# Patient Record
Sex: Female | Born: 1950 | Race: White | Hispanic: No | Marital: Married | Smoking: Never smoker
Health system: Southern US, Community
[De-identification: ages and names within clinical notes are randomized; demographics above are authoritative.]

## PROBLEM LIST (undated history)

## (undated) DIAGNOSIS — E039 Hypothyroidism, unspecified: Secondary | ICD-10-CM

## (undated) DIAGNOSIS — E119 Type 2 diabetes mellitus without complications: Secondary | ICD-10-CM

## (undated) DIAGNOSIS — K769 Liver disease, unspecified: Secondary | ICD-10-CM

## (undated) DIAGNOSIS — I499 Cardiac arrhythmia, unspecified: Secondary | ICD-10-CM

## (undated) DIAGNOSIS — I2510 Atherosclerotic heart disease of native coronary artery without angina pectoris: Secondary | ICD-10-CM

## (undated) DIAGNOSIS — D869 Sarcoidosis, unspecified: Secondary | ICD-10-CM

## (undated) DIAGNOSIS — D861 Sarcoidosis of lymph nodes: Secondary | ICD-10-CM

## (undated) DIAGNOSIS — J449 Chronic obstructive pulmonary disease, unspecified: Secondary | ICD-10-CM

## (undated) DIAGNOSIS — F32A Depression, unspecified: Secondary | ICD-10-CM

## (undated) DIAGNOSIS — I10 Essential (primary) hypertension: Secondary | ICD-10-CM

## (undated) DIAGNOSIS — I350 Nonrheumatic aortic (valve) stenosis: Secondary | ICD-10-CM

## (undated) DIAGNOSIS — D759 Disease of blood and blood-forming organs, unspecified: Secondary | ICD-10-CM

## (undated) DIAGNOSIS — J189 Pneumonia, unspecified organism: Secondary | ICD-10-CM

## (undated) DIAGNOSIS — M1990 Unspecified osteoarthritis, unspecified site: Secondary | ICD-10-CM

## (undated) DIAGNOSIS — J45909 Unspecified asthma, uncomplicated: Secondary | ICD-10-CM

## (undated) DIAGNOSIS — I509 Heart failure, unspecified: Secondary | ICD-10-CM

## (undated) DIAGNOSIS — T8859XA Other complications of anesthesia, initial encounter: Secondary | ICD-10-CM

## (undated) DIAGNOSIS — C801 Malignant (primary) neoplasm, unspecified: Secondary | ICD-10-CM

## (undated) DIAGNOSIS — D860 Sarcoidosis of lung: Secondary | ICD-10-CM

## (undated) DIAGNOSIS — T4145XA Adverse effect of unspecified anesthetic, initial encounter: Secondary | ICD-10-CM

## (undated) DIAGNOSIS — F419 Anxiety disorder, unspecified: Secondary | ICD-10-CM

## (undated) DIAGNOSIS — R011 Cardiac murmur, unspecified: Secondary | ICD-10-CM

## (undated) DIAGNOSIS — C229 Malignant neoplasm of liver, not specified as primary or secondary: Secondary | ICD-10-CM

## (undated) HISTORY — DX: Malignant neoplasm of liver, not specified as primary or secondary: C22.9

## (undated) HISTORY — PX: OTHER SURGICAL HISTORY: SHX169

## (undated) HISTORY — PX: HERNIA REPAIR: SHX51

## (undated) HISTORY — PX: ABDOMINAL SURGERY: SHX537

## (undated) HISTORY — PX: CHOLECYSTECTOMY: SHX55

## (undated) HISTORY — PX: ABDOMINAL HYSTERECTOMY: SHX81

---

## 1999-01-19 HISTORY — PX: LASIK: SHX215

## 2001-04-11 ENCOUNTER — Ambulatory Visit (HOSPITAL_COMMUNITY): Admission: RE | Admit: 2001-04-11 | Discharge: 2001-04-11 | Payer: Self-pay | Admitting: Gastroenterology

## 2001-04-13 ENCOUNTER — Ambulatory Visit (HOSPITAL_COMMUNITY): Admission: RE | Admit: 2001-04-13 | Discharge: 2001-04-13 | Payer: Self-pay | Admitting: Gastroenterology

## 2001-04-13 ENCOUNTER — Encounter: Payer: Self-pay | Admitting: Gastroenterology

## 2001-04-26 ENCOUNTER — Encounter (INDEPENDENT_AMBULATORY_CARE_PROVIDER_SITE_OTHER): Payer: Self-pay | Admitting: Specialist

## 2001-04-26 ENCOUNTER — Ambulatory Visit (HOSPITAL_COMMUNITY): Admission: RE | Admit: 2001-04-26 | Discharge: 2001-04-26 | Payer: Self-pay | Admitting: Gastroenterology

## 2001-05-01 ENCOUNTER — Encounter: Admission: RE | Admit: 2001-05-01 | Discharge: 2001-05-01 | Payer: Self-pay | Admitting: Gastroenterology

## 2001-05-01 ENCOUNTER — Encounter: Payer: Self-pay | Admitting: Gastroenterology

## 2001-07-24 ENCOUNTER — Encounter: Payer: Self-pay | Admitting: Surgery

## 2001-07-27 ENCOUNTER — Inpatient Hospital Stay (HOSPITAL_COMMUNITY): Admission: RE | Admit: 2001-07-27 | Discharge: 2001-07-28 | Payer: Self-pay | Admitting: Surgery

## 2001-10-12 ENCOUNTER — Ambulatory Visit (HOSPITAL_COMMUNITY): Admission: RE | Admit: 2001-10-12 | Discharge: 2001-10-12 | Payer: Self-pay | Admitting: Gastroenterology

## 2002-04-02 ENCOUNTER — Inpatient Hospital Stay (HOSPITAL_COMMUNITY): Admission: AD | Admit: 2002-04-02 | Discharge: 2002-04-02 | Payer: Self-pay | Admitting: Family Medicine

## 2002-07-01 ENCOUNTER — Emergency Department (HOSPITAL_COMMUNITY): Admission: EM | Admit: 2002-07-01 | Discharge: 2002-07-01 | Payer: Self-pay | Admitting: Emergency Medicine

## 2002-07-01 ENCOUNTER — Encounter: Payer: Self-pay | Admitting: Emergency Medicine

## 2002-07-11 ENCOUNTER — Encounter: Payer: Self-pay | Admitting: Gastroenterology

## 2002-07-11 ENCOUNTER — Encounter: Admission: RE | Admit: 2002-07-11 | Discharge: 2002-07-11 | Payer: Self-pay | Admitting: Gastroenterology

## 2002-07-26 ENCOUNTER — Encounter: Admission: RE | Admit: 2002-07-26 | Discharge: 2002-07-26 | Payer: Self-pay | Admitting: Gastroenterology

## 2002-07-26 ENCOUNTER — Encounter: Payer: Self-pay | Admitting: Gastroenterology

## 2002-09-17 ENCOUNTER — Encounter: Payer: Self-pay | Admitting: Emergency Medicine

## 2002-09-17 ENCOUNTER — Emergency Department (HOSPITAL_COMMUNITY): Admission: EM | Admit: 2002-09-17 | Discharge: 2002-09-17 | Payer: Self-pay | Admitting: Emergency Medicine

## 2002-09-28 ENCOUNTER — Encounter: Admission: RE | Admit: 2002-09-28 | Discharge: 2002-09-28 | Payer: Self-pay | Admitting: Surgery

## 2002-09-28 ENCOUNTER — Encounter: Payer: Self-pay | Admitting: Surgery

## 2003-10-01 ENCOUNTER — Encounter (INDEPENDENT_AMBULATORY_CARE_PROVIDER_SITE_OTHER): Payer: Self-pay | Admitting: Specialist

## 2003-10-01 ENCOUNTER — Observation Stay (HOSPITAL_COMMUNITY): Admission: RE | Admit: 2003-10-01 | Discharge: 2003-10-02 | Payer: Self-pay | Admitting: Surgery

## 2004-02-13 ENCOUNTER — Ambulatory Visit (HOSPITAL_COMMUNITY): Admission: RE | Admit: 2004-02-13 | Discharge: 2004-02-13 | Payer: Self-pay | Admitting: Gastroenterology

## 2004-02-13 ENCOUNTER — Encounter (INDEPENDENT_AMBULATORY_CARE_PROVIDER_SITE_OTHER): Payer: Self-pay | Admitting: Specialist

## 2004-08-10 ENCOUNTER — Emergency Department (HOSPITAL_COMMUNITY): Admission: EM | Admit: 2004-08-10 | Discharge: 2004-08-11 | Payer: Self-pay | Admitting: Emergency Medicine

## 2005-08-09 ENCOUNTER — Ambulatory Visit (HOSPITAL_COMMUNITY): Admission: RE | Admit: 2005-08-09 | Discharge: 2005-08-09 | Payer: Self-pay | Admitting: Internal Medicine

## 2005-08-18 ENCOUNTER — Encounter: Admission: RE | Admit: 2005-08-18 | Discharge: 2005-08-18 | Payer: Self-pay | Admitting: Internal Medicine

## 2006-04-13 ENCOUNTER — Encounter: Admission: RE | Admit: 2006-04-13 | Discharge: 2006-04-13 | Payer: Self-pay | Admitting: Internal Medicine

## 2006-10-24 ENCOUNTER — Ambulatory Visit (HOSPITAL_COMMUNITY): Admission: RE | Admit: 2006-10-24 | Discharge: 2006-10-24 | Payer: Self-pay | Admitting: Internal Medicine

## 2007-03-24 ENCOUNTER — Encounter (INDEPENDENT_AMBULATORY_CARE_PROVIDER_SITE_OTHER): Payer: Self-pay | Admitting: Gastroenterology

## 2007-03-24 ENCOUNTER — Ambulatory Visit (HOSPITAL_COMMUNITY): Admission: RE | Admit: 2007-03-24 | Discharge: 2007-03-24 | Payer: Self-pay | Admitting: Gastroenterology

## 2007-04-20 ENCOUNTER — Ambulatory Visit (HOSPITAL_COMMUNITY): Admission: RE | Admit: 2007-04-20 | Discharge: 2007-04-20 | Payer: Self-pay | Admitting: Gastroenterology

## 2007-07-10 ENCOUNTER — Ambulatory Visit: Payer: Self-pay | Admitting: Cardiology

## 2007-07-11 ENCOUNTER — Observation Stay (HOSPITAL_COMMUNITY): Admission: EM | Admit: 2007-07-11 | Discharge: 2007-07-12 | Payer: Self-pay | Admitting: Emergency Medicine

## 2007-07-18 ENCOUNTER — Ambulatory Visit: Payer: Self-pay | Admitting: Cardiology

## 2007-08-08 ENCOUNTER — Emergency Department (HOSPITAL_COMMUNITY): Admission: EM | Admit: 2007-08-08 | Discharge: 2007-08-08 | Payer: Self-pay | Admitting: Family Medicine

## 2007-08-23 ENCOUNTER — Ambulatory Visit: Payer: Self-pay | Admitting: Cardiology

## 2007-08-23 LAB — CONVERTED CEMR LAB: TSH: 0.58 microintl units/mL (ref 0.35–5.50)

## 2007-08-31 ENCOUNTER — Ambulatory Visit (HOSPITAL_COMMUNITY): Admission: RE | Admit: 2007-08-31 | Discharge: 2007-08-31 | Payer: Self-pay | Admitting: Neurosurgery

## 2007-10-26 ENCOUNTER — Ambulatory Visit (HOSPITAL_COMMUNITY): Admission: RE | Admit: 2007-10-26 | Discharge: 2007-10-26 | Payer: Self-pay | Admitting: Internal Medicine

## 2007-11-16 ENCOUNTER — Encounter: Admission: RE | Admit: 2007-11-16 | Discharge: 2007-11-16 | Payer: Self-pay | Admitting: Internal Medicine

## 2008-06-08 ENCOUNTER — Emergency Department (HOSPITAL_COMMUNITY): Admission: EM | Admit: 2008-06-08 | Discharge: 2008-06-08 | Payer: Self-pay | Admitting: Family Medicine

## 2008-10-18 ENCOUNTER — Ambulatory Visit (HOSPITAL_COMMUNITY): Admission: RE | Admit: 2008-10-18 | Discharge: 2008-10-18 | Payer: Self-pay | Admitting: Gastroenterology

## 2008-10-24 ENCOUNTER — Ambulatory Visit (HOSPITAL_COMMUNITY): Admission: RE | Admit: 2008-10-24 | Discharge: 2008-10-24 | Payer: Self-pay | Admitting: Gastroenterology

## 2008-11-22 ENCOUNTER — Emergency Department (HOSPITAL_COMMUNITY): Admission: EM | Admit: 2008-11-22 | Discharge: 2008-11-22 | Payer: Self-pay | Admitting: Emergency Medicine

## 2008-12-03 ENCOUNTER — Inpatient Hospital Stay (HOSPITAL_COMMUNITY): Admission: RE | Admit: 2008-12-03 | Discharge: 2008-12-06 | Payer: Self-pay | Admitting: Surgery

## 2008-12-03 ENCOUNTER — Encounter (INDEPENDENT_AMBULATORY_CARE_PROVIDER_SITE_OTHER): Payer: Self-pay | Admitting: Surgery

## 2009-08-27 ENCOUNTER — Emergency Department (HOSPITAL_COMMUNITY): Admission: EM | Admit: 2009-08-27 | Discharge: 2009-08-27 | Payer: Self-pay | Admitting: Family Medicine

## 2009-09-17 ENCOUNTER — Ambulatory Visit (HOSPITAL_COMMUNITY): Admission: RE | Admit: 2009-09-17 | Discharge: 2009-09-17 | Payer: Self-pay | Admitting: Internal Medicine

## 2009-10-31 ENCOUNTER — Observation Stay (HOSPITAL_COMMUNITY)
Admission: EM | Admit: 2009-10-31 | Discharge: 2009-11-02 | Payer: Self-pay | Source: Home / Self Care | Admitting: Emergency Medicine

## 2010-01-29 ENCOUNTER — Ambulatory Visit (HOSPITAL_COMMUNITY)
Admission: RE | Admit: 2010-01-29 | Discharge: 2010-01-29 | Payer: Self-pay | Source: Home / Self Care | Attending: Gastroenterology | Admitting: Gastroenterology

## 2010-02-19 ENCOUNTER — Encounter (HOSPITAL_COMMUNITY): Payer: 59 | Attending: Surgery

## 2010-02-19 DIAGNOSIS — K449 Diaphragmatic hernia without obstruction or gangrene: Secondary | ICD-10-CM | POA: Insufficient documentation

## 2010-02-19 DIAGNOSIS — K219 Gastro-esophageal reflux disease without esophagitis: Secondary | ICD-10-CM | POA: Insufficient documentation

## 2010-02-19 LAB — SURGICAL PCR SCREEN
MRSA, PCR: NEGATIVE
Staphylococcus aureus: NEGATIVE

## 2010-02-19 LAB — COMPREHENSIVE METABOLIC PANEL
Albumin: 3.8 g/dL (ref 3.5–5.2)
Alkaline Phosphatase: 110 U/L (ref 39–117)
BUN: 15 mg/dL (ref 6–23)
Calcium: 9.5 mg/dL (ref 8.4–10.5)
Creatinine, Ser: 0.77 mg/dL (ref 0.4–1.2)
Glucose, Bld: 112 mg/dL — ABNORMAL HIGH (ref 70–99)
Potassium: 3.9 mEq/L (ref 3.5–5.1)
Total Protein: 7.1 g/dL (ref 6.0–8.3)

## 2010-02-24 ENCOUNTER — Inpatient Hospital Stay (HOSPITAL_COMMUNITY)
Admission: RE | Admit: 2010-02-24 | Discharge: 2010-02-27 | DRG: 328 | Disposition: A | Discharge status: Home / Self Care | Payer: 59 | Attending: Surgery | Admitting: Surgery

## 2010-02-24 DIAGNOSIS — K449 Diaphragmatic hernia without obstruction or gangrene: Secondary | ICD-10-CM | POA: Diagnosis present

## 2010-02-24 DIAGNOSIS — Y838 Other surgical procedures as the cause of abnormal reaction of the patient, or of later complication, without mention of misadventure at the time of the procedure: Secondary | ICD-10-CM | POA: Diagnosis present

## 2010-02-24 DIAGNOSIS — K219 Gastro-esophageal reflux disease without esophagitis: Secondary | ICD-10-CM | POA: Diagnosis present

## 2010-02-24 DIAGNOSIS — K929 Disease of digestive system, unspecified: Principal | ICD-10-CM | POA: Diagnosis present

## 2010-02-24 DIAGNOSIS — I10 Essential (primary) hypertension: Secondary | ICD-10-CM | POA: Diagnosis present

## 2010-02-24 DIAGNOSIS — K660 Peritoneal adhesions (postprocedural) (postinfection): Secondary | ICD-10-CM | POA: Diagnosis present

## 2010-02-25 ENCOUNTER — Inpatient Hospital Stay (HOSPITAL_COMMUNITY): Payer: 59

## 2010-02-25 LAB — CBC
HCT: 36.2 % (ref 36.0–46.0)
Hemoglobin: 12.2 g/dL (ref 12.0–15.0)
MCHC: 33.7 g/dL (ref 30.0–36.0)
RBC: 4.14 MIL/uL (ref 3.87–5.11)
WBC: 12.4 10*3/uL — ABNORMAL HIGH (ref 4.0–10.5)

## 2010-02-25 MED ORDER — IOHEXOL 300 MG/ML  SOLN: 50.0000 mL | Freq: Once | INTRAMUSCULAR | Status: AC | PRN

## 2010-02-26 LAB — CBC
Hemoglobin: 12.1 g/dL (ref 12.0–15.0)
MCH: 29.8 pg (ref 26.0–34.0)
MCV: 88.4 fL (ref 78.0–100.0)
Platelets: 133 10*3/uL — ABNORMAL LOW (ref 150–400)
RBC: 4.06 MIL/uL (ref 3.87–5.11)
WBC: 7.8 10*3/uL (ref 4.0–10.5)

## 2010-03-01 NOTE — Op Note (Signed)
NAMECHASSITY, [name]                  ACCOUNT NO.:  [id number]  MEDICAL RECORD NO.:  [id number]           PATIENT TYPE:  I  LOCATION:  PADM                         FACILITY:  [facility]  PHYSICIAN:  [name]. [name] Deutscher, MD  DATE OF BIRTH:  24-Jul-1950  DATE OF PROCEDURE: DATE OF DISCHARGE:  02/19/2010                              OPERATIVE REPORT   PREOPERATIVE DIAGNOSIS:  [name] had a laparoscopic Nissen done in 2003 without pledgets and had recurrent reflux symptoms, operated on in 2010 after she had had a previous gallbladder attack with some nausea and vomiting.  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  Upper GI shows what appears to be her wrap herniated above the diaphragm.  PROCEDURE:  Laparoscopic foregut dissection with takedown of adhesions and delineation of the anatomy, laparoscopic posterior repair of the diaphragm with a pledgeted suture first and with a second suture with the #50 lighted bougie in place calibrating the hiatus.  This brought it snugly and the second suture actually incorporated a piece of the old porcine biograft on the right crura, upper endoscopy by Dr. Ezzard [name] to verify anatomy which showed a good wrap with the wrap and the Z-line well below the diaphragm, anterior gastric plication plicating the anterior portion of the stomach up to the wrap to keep the wrap in the abdominal cavity.  SURGEON:  [name]. [name], M.D.  ASSISTANT:  [name]. [name], M.D.  OPERATING TIME:  Three and a half hours.  DESCRIPTION OF PROCEDURE:  [name] is a 60 year old lady taken to room 1 on Tuesday, February 24, 2010, and given general anesthesia.  The abdomen was prepped with PCMX and draped sterilely.  The abdomen was entered through the left upper quadrant using 5-mm 0 degrees Opti-Vu and I went through and placed trocars through her previous  incisions using the 10, 11 to the right lower and then the others were 5s.  An angle 5 scope was used.  [name] retractor was placed up in the midline retracting the left lateral segment.  I began a fairly tedious dissection mainly using scissors to work my way up and get above the wrap.  The wrap would appear to be herniated above the diaphragm, and I think this was related to her vomiting and Norwalk virus.  I carefully took all that down and eventually taken down a lot of adhesions which were very dense along to the mesh which was a Cook porcine biograft.  Once reduced we had an intact wrap, which we validated endoscopically and it was well below the diaphragm.  We put a 50 bougie down then and then repaired the diaphragm posteriorly first with a pledgeted suture and then with a second suture with tie knots.  It seemed to snug it up nicely.  I then went down and took purchases of the more distal stomach and sutured up to the wrap to form an anterior plication of the stomach up to the wrap.  These were placed  with a free needle and tied extracorporeal with no tie knots being used for those.  A nice result appeared and it appeared that we had achieved reduction of the wrap below the diaphragm and securing of the closure of the posterior hiatus.  The patient seemed to tolerate this procedure well and was taken to the recovery room in satisfactory condition after wounds were injected with Marcaine and were closed with 4-0 Vicryl.     [name] [name] Deutscher, MD     MBM/MEDQ  D:  02/24/2010  T:  02/24/2010  Job:  [id number]  cc:   [name], M.D. Fax: [phone]  JXBJYN WGN FAOZ, MD, Luella.Lat Fax: [phone]  Electronically Signed by [name] Murphy MD on 02/27/2010 06:30:45 PM

## 2010-03-01 NOTE — Discharge Summary (Signed)
  NAMELANNA, [name]                  ACCOUNT NO.:  [id number]  MEDICAL RECORD NO.:  [id number]           PATIENT TYPE:  I  LOCATION:  1526                         FACILITY:  [facility]  PHYSICIAN:  Thornton Park. 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  PROCEDURE:  Laparoscopic foregut dissection, repair of hiatal hernia posteriorly with two sutures, one with pledgets, upper endoscopy, anterior gastric plication, plicating the wrap down onto the stomach.  SURGEON:  Thornton Park. Daphine Deutscher, M.D.  COURSE IN THE HOSPITAL:  This 60 year old lady underwent the above- mentioned operation.  Postoperatively she did well.  On postoperative day #1 she had a swallow, which looked good.  She was started on liquids, clear and advanced to full.  By postoperative day #3 she was ready for discharge.  CONDITION ON DISCHARGE:  Condition good.  DISCHARGE MEDICATIONS:  Pain medicines none other than Tylenol over-the- counter.  FOLLOWUP:  Return in 2 to 3 weeks.     Thornton Park Daphine Deutscher, MD     MBM/MEDQ  D:  02/27/2010  T:  02/27/2010  Job:  [id number]  Electronically Signed by Luretha Murphy MD on 02/27/2010 06:30:50 PM

## 2010-04-01 LAB — CSF CELL COUNT WITH DIFFERENTIAL: WBC, CSF: 7 /mm3 — ABNORMAL HIGH (ref 0–5)

## 2010-04-01 LAB — GRAM STAIN

## 2010-04-01 LAB — URINALYSIS, ROUTINE W REFLEX MICROSCOPIC
Bilirubin Urine: NEGATIVE
Nitrite: NEGATIVE
Protein, ur: 30 mg/dL — AB
Specific Gravity, Urine: 1.016 (ref 1.005–1.030)
Urobilinogen, UA: 0.2 mg/dL (ref 0.0–1.0)

## 2010-04-01 LAB — COMPREHENSIVE METABOLIC PANEL
ALT: 44 U/L — ABNORMAL HIGH (ref 0–35)
ALT: 47 U/L — ABNORMAL HIGH (ref 0–35)
AST: 33 U/L (ref 0–37)
AST: 35 U/L (ref 0–37)
AST: 42 U/L — ABNORMAL HIGH (ref 0–37)
Albumin: 3.8 g/dL (ref 3.5–5.2)
Albumin: 3.8 g/dL (ref 3.5–5.2)
Alkaline Phosphatase: 116 U/L (ref 39–117)
CO2: 27 mEq/L (ref 19–32)
CO2: 27 mEq/L (ref 19–32)
Calcium: 8.9 mg/dL (ref 8.4–10.5)
Chloride: 109 mEq/L (ref 96–112)
Chloride: 111 mEq/L (ref 96–112)
Creatinine, Ser: 0.86 mg/dL (ref 0.4–1.2)
GFR calc Af Amer: >60 mL/min (ref >60)
GFR calc Af Amer: >60 mL/min (ref >60)
GFR calc non Af Amer: >60 mL/min (ref >60)
GFR calc non Af Amer: >60 mL/min (ref >60)
Glucose, Bld: 118 mg/dL — ABNORMAL HIGH (ref 70–99)
Potassium: 3.9 mEq/L (ref 3.5–5.1)
Potassium: 4.1 mEq/L (ref 3.5–5.1)
Sodium: 138 mEq/L (ref 135–145)
Sodium: 140 mEq/L (ref 135–145)
Sodium: 142 mEq/L (ref 135–145)
Total Bilirubin: 0.7 mg/dL (ref 0.3–1.2)
Total Protein: 6.2 g/dL (ref 6.0–8.3)
Total Protein: 6.9 g/dL (ref 6.0–8.3)

## 2010-04-01 LAB — AMMONIA: Ammonia: 30 umol/L (ref 11–35)

## 2010-04-01 LAB — CBC
HCT: 41.8 % (ref 36.0–46.0)
HCT: 42.4 % (ref 36.0–46.0)
Hemoglobin: 14.3 g/dL (ref 12.0–15.0)
Hemoglobin: 14.4 g/dL (ref 12.0–15.0)
MCH: 29.5 pg (ref 26.0–34.0)
MCV: 86.4 fL (ref 78.0–100.0)
MCV: 86.7 fL (ref 78.0–100.0)
RBC: 4.84 MIL/uL (ref 3.87–5.11)
WBC: 4.9 10*3/uL (ref 4.0–10.5)

## 2010-04-01 LAB — DIFFERENTIAL
Basophils Absolute: 0.1 10*3/uL (ref 0.0–0.1)
Basophils Relative: 2 % — ABNORMAL HIGH (ref 0–1)
Eosinophils Absolute: 0 10*3/uL (ref 0.0–0.7)
Eosinophils Absolute: 0.2 10*3/uL (ref 0.0–0.7)
Eosinophils Relative: 0 % (ref 0–5)
Eosinophils Relative: 4 % (ref 0–5)
Monocytes Absolute: 0.3 10*3/uL (ref 0.1–1.0)
Monocytes Absolute: 0.4 10*3/uL (ref 0.1–1.0)
Monocytes Relative: 8 % (ref 3–12)

## 2010-04-01 LAB — CK TOTAL AND CKMB (NOT AT ARMC)
CK, MB: 2.7 ng/mL (ref 0.3–4.0)
Total CK: 65 U/L (ref 7–177)

## 2010-04-01 LAB — APTT: aPTT: 26 seconds (ref 24–37)

## 2010-04-01 LAB — CSF CULTURE W GRAM STAIN: Culture: NO GROWTH

## 2010-04-01 LAB — TSH: TSH: 1.859 u[IU]/mL (ref 0.350–4.500)

## 2010-04-01 LAB — URINE MICROSCOPIC-ADD ON

## 2010-04-01 LAB — GLUCOSE, CSF: Glucose, CSF: 61 mg/dL (ref 43–76)

## 2010-04-01 LAB — HEMOGLOBIN A1C: Hgb A1c MFr Bld: 6.2 % — ABNORMAL HIGH (ref <5.7)

## 2010-04-01 LAB — METHYLMALONIC ACID, SERUM: Methylmalonic Acid, Quantitative: 204 nmol/L (ref 87–318)

## 2010-04-01 LAB — GLUCOSE, CAPILLARY: Glucose-Capillary: 128 mg/dL — ABNORMAL HIGH (ref 70–99)

## 2010-04-01 LAB — TROPONIN I: Troponin I: 0.38 ng/mL — ABNORMAL HIGH (ref 0.00–0.06)

## 2010-04-22 LAB — CBC
MCHC: 33.6 g/dL (ref 30.0–36.0)
Platelets: 134 10*3/uL — ABNORMAL LOW (ref 150–400)
RDW: 12.1 % (ref 11.5–15.5)

## 2010-04-22 LAB — COMPREHENSIVE METABOLIC PANEL
ALT: 89 U/L — ABNORMAL HIGH (ref 0–35)
AST: 65 U/L — ABNORMAL HIGH (ref 0–37)
Albumin: 3.8 g/dL (ref 3.5–5.2)
Alkaline Phosphatase: 92 U/L (ref 39–117)
CO2: 28 mEq/L (ref 19–32)
Chloride: 107 mEq/L (ref 96–112)
Creatinine, Ser: 0.76 mg/dL (ref 0.4–1.2)
GFR calc Af Amer: >60 mL/min (ref >60)
GFR calc non Af Amer: >60 mL/min (ref >60)
Potassium: 4.7 mEq/L (ref 3.5–5.1)
Sodium: 140 mEq/L (ref 135–145)
Total Bilirubin: 0.6 mg/dL (ref 0.3–1.2)

## 2010-04-22 LAB — DIFFERENTIAL
Basophils Absolute: 0 10*3/uL (ref 0.0–0.1)
Basophils Relative: 0 % (ref 0–1)
Lymphocytes Relative: 11 % — ABNORMAL LOW (ref 12–46)
Monocytes Absolute: 0.5 10*3/uL (ref 0.1–1.0)
Neutro Abs: 8.1 10*3/uL — ABNORMAL HIGH (ref 1.7–7.7)
Neutrophils Relative %: 83 % — ABNORMAL HIGH (ref 43–77)

## 2010-06-02 NOTE — Op Note (Signed)
NAMEMAYBEL, DAMBROSIO                  ACCOUNT NO.:  [id number]   MEDICAL RECORD NO.:  [id number]          PATIENT TYPE:  AMB   LOCATION:  ENDO                         FACILITY:  [facility]   PHYSICIAN:  Anselmo Rod, M.D.  DATE OF BIRTH:  1950/11/15   DATE OF PROCEDURE:  03/24/2007  DATE OF DISCHARGE:  03/24/2007                               OPERATIVE REPORT   PROCEDURE:  Colonoscopy with snare polypectomy x 1.   ENDOSCOPIST:  Anselmo Rod, M.D.   INSTRUMENT USED:  Pentax video colonoscope.   INDICATIONS FOR PROCEDURE:  A 60 year old white female with a history of  abdominal pain underwent CRC screening to rule out colonic polyps,  masses, etc.   PREPROCEDURE PREPARATION:  Informed consent was procured from the  patient. The patient was fasted for four hours prior to the procedure  after being prepped with 20 OsmoPrep pills the night of and 12 OsmoPrep  pills the morning of the procedure.  The risks and benefits of the  procedure including a 10% miss rate of cancer and polyp were discussed  with the patient as well.   PREPROCEDURE PHYSICAL EXAMINATION:  VITAL SIGNS:  Stable.  NECK:  Supple.  CHEST:  Clear to auscultation.  S1 and S2 regular.  ABDOMEN:  Soft and normal bowel sounds.   DESCRIPTION OF PROCEDURE:  The patient was placed in the left lateral  decubitus position and sedated with 150 mcg of Fentanyl and 8 mg of  Versed given intravenously in slow incremental doses. Once the patient  was adequately sedated and maintained on low flow oxygen and continuous  cardiac monitoring, the Pentax video colonoscope was advanced from the  rectum to the cecum.  A small sessile polyp was removed by a hard snare  from 20 cm and the patient has somewhat tortuous redundant colon.  Therefore the adult scope was withdrawn from hepatic flexure and the  pediatric scope used instead.  The appendiceal orifice and ileocecal  valve were visualized and photographed.  No other polyps, masses,  erosions, ulcerations, or diverticula were noted.  Retroflexion in the  rectum revealed no abnormality.  The patient tolerated the procedure  well without any immediate complications.   IMPRESSION:  1. Small sessile polyp removed by hard snare from 20 cm. Otherwise      normal examination up to the cecum.  No other masses or polyps      seen.  2. No evidence of diverticulosis.  3. Adult scope changed to pediatric scope because the patient has a      redundant tortuous colon possibly from the multiple surgeries that      she has had in the past.   RECOMMENDATIONS:  1. Await pathology results.  2. Avoid all nonsteroidals including aspirin for the next two weeks.  3. Outpatient follow-up in the next two weeks for further      recommendations.  4. Repeat colonoscopy depending on the pathology results.      Anselmo Rod, M.D.  Electronically Signed     JNM/MEDQ  D:  03/27/2007  T:  03/28/2007  Job:  [id number]   cc:   Carollee Massed

## 2010-06-02 NOTE — Assessment & Plan Note (Signed)
[facility]                            CARDIOLOGY OFFICE NOTE   NAME:[name]                         MRN:          [id number]  DATE:07/18/2007                            DOB:          May 13, 1950    [name] is in for followup.  She was recently admitted to the hospital with  an episode of chest pain.  She has not been seen previously.  She has no  primary care doctor.  She has seen Dr. Loreta Ave because of elevated liver  functions.  While she was in the hospital, she had a normal chest x-ray  and a normal D-dimer.  She underwent exercise Myoview imaging.  The  images revealed a normal stress study with no ischemia or infarction,  hypertensive response to exercise, and ejection fraction of 75%.  She  did get short of breath with only about 4 minutes of exercise and had  very minimal discomfort.   Since discharge from the hospital, she has been to [place] and  done other things without any recurrent symptoms.   The patient has hypothyroidism and is on thyroid replacement.  She has  hyperlipidemia.  The exact source of her symptoms was unclear.   Currently, the blood pressure is 158/90, the pulse is 84, the lung  fields are clear, and the cardiac rhythm is regular.   Hospital labs included negative cardiac enzymes.  Renal function studies  were unremarkable.  Her TSH was 0.355.  Her LDL was 122 with an HDL of  31.   Overall, the patient is stable.  I have encouraged her to try to lose  weight.  She is going to get a primary care doctor and she is going to  record her blood pressures on a regular basis.  I think she is stable to  return to work at this point in time.  She will need to have her TSH  rechecked and I will see her back in 4 weeks, at which time we will  reassess this.     Arturo Morton. Riley Kill, MD, [facility]  Electronically Signed    TDS/MedQ  DD: 07/18/2007  DT: 07/19/2007  Job #: [id number]

## 2010-06-02 NOTE — Discharge Summary (Signed)
[name], [name]                  ACCOUNT NO.:  [id number]   MEDICAL RECORD NO.:  [id number]          PATIENT TYPE:  OBV   LOCATION:  6531                         FACILITY:  [facility]   PHYSICIAN:  [name]   DATE OF ADMISSION:  07/10/2007  DATE OF DISCHARGE:  07/12/2007                               DISCHARGE SUMMARY   PROCEDURES PERFORMED DURING HOSPITALIZATION:  Stress Myoview revealed  normal per Dr. Charlton [name].   FINAL DISCHARGE DIAGNOSES:  1. Newly diagnosed hypertension.  2. Atypical chest pain.  A:  Negative cardiac enzymes and EKG.  1. History of hyperlipidemia.  2. History of hypothyroidism.  3. Recent influenza exposure currently on Tamiflu.   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  The patient had no exacerbating or relieving  factors.  She was transferred to [facility]  secondary to the symptoms.  Morphine did relieve her chest discomfort,  but unfortunately she developed significant nausea and vomiting.  The  patient was admitted to rule out myocardial infarction and followed by  Dr. Bonnee [name] and Dr. Rollene Rotunda during her admission.  The  patient's cardiac enzymes were cycled and found to be negative x3 at  0.05, 0.05 0.05, and 0.02 respectively.  Cholesterol studies were also  completed and she had mild hyperlipidemia with LDL of 122.  She had EKG  revealing normal sinus rhythm with a right bundle branch block and no  acute abnormalities.  The patient subsequently was scheduled for a  stress nuclear study at the request of Dr. Riley [name] secondary to the  patient's family history of CAD.  She did have a family history of  premature coronary artery  disease with her father in his 36s.  The  patient did undergo a stress nuclear study on July 12, 2007, she did  have a hypertensive response to exercise with some mild chest pressure  and maximum were close with a blood pressure of 230/69 and heart rate of  136 which subsequently went away with completion of the test and blood  pressure and heart rate returned to normal without difficulty.  The  patient prior to the stress test blood pressure was 164/69 at rest.  After discussion with Dr. Riley [name], he felt that the patient would  benefit from Norvasc and she was placed on Norvasc 5 mg 1 p.o. q. day  and a prescription was provided for her as an R.N. I have asked her to  please take her blood pressure in the morning and in the evening and  record.  She is not to go back to work until followed by Dr. Riley [name].  I  have also advised her that if her blood pressure remains low that she is  to call our office and we may need to stop or adjust the dose of  the  Norvasc.   DISCHARGE LABS:  Troponins as above.  Total cholesterol 182,  triglycerides 126, HDL 31, LDL 122, lipase 26 amylase 129, BNP less than  30, PT 12.9, INR 1.0.  Sodium 140, potassium 4.1, chloride 109, BUN 17,  creatinine 0.8, glucose 107, hemoglobin 14.0, hematocrit 41, white blood  cells 5.8, platelets 160.   DISCHARGE VITAL SIGNS:  Blood pressure 126/45, heart rate 68,  respirations 18, temperature 98.6   DISCHARGE MEDICATIONS:  1. Synthroid 150 mcg daily.  2. Prilosec over-the-counter daily.  3. Norvasc 5 mg daily (She is to take blood pressure twice a day and      record and call for any evidence of hypotension with a systolic      blood pressure less than 100).   ALLERGIES:  No known drug allergies.   FOLLOWUP PLANS AND APPOINTMENT:  1. The patient will be seen by Dr. Riley [name] on July 18, 2007, at 2:00      p.m.  2. She is not to return to work until after July 18, 2007, appointment      and released by Dr. Riley [name]   3. The patient has been advised on new prescription for Norvasc and to      take her blood pressure in the a.m. and in the p.m. and record and      she is to not take the Norvasc for a systolic blood pressure less      than 100 and to call our office for this results.   Time spent with the patient 30 minutes.      [name] Mare. Lyman Bishop, NP      [name] Rotunda, [name], [facility]  Electronically Signed    KML/MEDQ  D:  07/12/2007  T:  07/13/2007  Job:  [id number]

## 2010-06-02 NOTE — H&P (Signed)
[name], [name] NO.:  [id number]   MEDICAL RECORD NO.:  [id number]          PATIENT TYPE:  OBV   LOCATION:  6524                         FACILITY:  [facility]   PHYSICIAN:  Lowella Bandy, MD      DATE OF BIRTH:  17-Jun-1950   DATE OF ADMISSION:  07/10/2007  DATE OF DISCHARGE:                              HISTORY & PHYSICAL   PRIMARY CARE PHYSICIAN:  The patient's primary care doctor; [facility].   PRIMARY CARDIOLOGIST:  None.   CHIEF COMPLAINT:  Chest pain.   HISTORY OF THE PRESENT ILLNESS:  The patient is a very pleasant 60-year-  old white female with cardiac risk factor of a strong family history of  premature coronary artery disease who started having chest pain while  working this evening at approximately 9:00 P.M.  She was working in her  usual shift as a Engineer, civil (consulting) at [employer]  and reports that she  developed left sharp left-sided chest pain.  She reports that it was  initially up to 8/10  in severity and was nearly constant.  She cannot  identify any exacerbating or alleviating factors.  After the pain  persisted for over several minutes she was transferred to the [facility]  emergency department.  Morphine has helped relieve her chest pain, but  unfortunately she has subsequently developed significant nausea and  vomiting.  She denies any antecedent fever, cough or swelling in her  legs.  She reports that she was feeling well and in her baseline state  of health until this event occurred.  She denies any unusual stressful  events while at work this evening.   PAST MEDICAL AND SURGICAL HISTORY:  1. Hypothyroidism.  2. Fatty liver disease.  3. Questionable borderline hyperlipidemia.  4. Gastroesophageal reflux disease status post Nissen fundoplication.  5. Status post hysterectomy.  6. Status post cholecystectomy.   MEDICATIONS:  1. Levo-thyroxine 150 mcg daily.  2. Prilosec 20 mg daily.  3. Tamiflu since Friday for empiric prophylaxis after  the patient      reportedly had influenza exposure at [facility].   ALLERGIES:  No known drug allergies.   SOCIAL HISTORY:  The patient lives with her husband.  She works as a  Engineer, civil (consulting) at [employer].  She is nonsmoker and does not drink alcohol.   FAMILY HISTORY:  The family history is positive for her father having  his first MI in his 4s and had bypass surgery in his 57s; however, he  eventually died of cardiac complications in his 54s.  Her birth mother,  whom she did not know very well she believes she had bypass surgery as  well.  One brother had an MI and CABG at age 85.   REVIEW OF SYSTEMS:  Ten systems are reviewed and are negative other than  as noted above in the HPI.   PHYSICAL EXAMINATION:  VITAL SIGNS:  On physical exam temperature is  98.3, blood pressure is 111/86, pulse is 79 and oxygen saturation is 98%  on 2 liters nasal cannula.  GENERAL APPEARANCE:  In  general the patient is breathing comfortably and  is in no apparent distress.  HEENT:  Normocephalic and atraumatic.  Extraocular movements are intact.  Sclerae anicteric.  Pupils react to light.  Oropharynx clear.  NECK:  The  neck is supple.  No masses appreciated.  No carotid bruits  or JVD.  HEART:  Cardiovascular exam reveals a regular rate and rhythm.  Normal  S1 and S2.  No murmurs, rubs or gallops.  CHEST:  The chest clear to auscultation bilaterally.  No crackles,  wheezes or rhonchi.  ABDOMEN:  The abdomen is soft, nontender and nondistended.  Normal bowel  sounds.  Cervix warm, no edema.  SKIN:  The skin reveals no rashes or eruptions.  MUSCULOSKELETAL:  The musculoskeletal exam showed no joint effusions or  erythema.  NEUROLOGIC EXAMINATION:  Neurologically the patient is alert and  oriented x3.  Cranial nerves are grossly intact and she is  moving all  extremities well.   LABORATORY DATA:  Chest x-ray; the chest x-ray reviewed is normal.  EKG  reviewed shows sinus rhythm at 90  beats/minute with RSR prime pattern,  otherwise normal EKG.  Laboratory values are reviewed.  The white blood  cell count is 5.8, hemoglobin is 14.0, hematocrit 41 is and  platelets  are 160,000.  Sodium 140, potassium 4.1, chloride 109, BUN 17,  creatinine 0.8, and glucose 107.  CK/MB 3.1.  Troponin I less than 0.05.   ASSESSMENT:  This is a 60 year old female with a strong family history  of premature coronary artery disease who presents with a chest pain  syndrome that is largely atypical for myocardial ischemia.  She does not  have any acute ST changes on her EKG and her initial troponin is  negative.  However, given her strong family history and concerning  symptoms, she will be admitted to rule out myocardial infarction.   PLAN:  1. We will admit her to Dr. Jenene Slicker service and rule out MI with      serial cardiac enzymes and EKGs.  2. Aspirin and therapeutic Lovenox have been administered in the      emergency department.  3. We will also check a D-dimer level, although pulmonary embolism      seems highly unlikely.  4. If she rules out for myocardial infarction, she might be able to be      discharged with an outpatient stress evaluation.      Lowella Bandy, MD  Electronically Signed     JJC/MEDQ  D:  07/11/2007  T:  07/11/2007  Job:  [id number]

## 2010-06-02 NOTE — Assessment & Plan Note (Signed)
[facility]                            CARDIOLOGY OFFICE NOTE   NAME:[name], [name] WASSENAAR                         MRN:          [id number]  DATE:08/23/2007                            DOB:          May 22, 1950    [name] is in for followup.  She has been seeing Dr. Ludwig Clarks and her  blood pressure has really been improved on Azor.  She has not had any  recurrent significant problems.  Fortunately, she does not smoke.   Her current medications include,  1. Synthroid 150 mcg daily.  2. Prilosec 20 mg daily.  3. Fish oil daily.  4. Aspirin 325 mg daily.  5. Azor 1/2 tablet daily.   The weight is 173 pounds, the blood pressure is 133/73, and the pulse is  63.   The patient did undergo a stress nuclear imaging in June of this year.  She had a hypertensive response to exercise with some mild blood  pressure elevation and some chest discomfort, but perfusion scan was  unremarkable.  Ejection fraction was 75%.   We will go ahead and get a TSH level to make sure that is stable.  I  will see her back in followup probably as needed, and she can continue  to follow up with Dr. Ludwig Clarks at the present time.  Should she have any  further problems, she is to contact us promptly.     Arturo Morton. Riley Kill, MD, [facility]  Electronically Signed    TDS/MedQ  DD: 09/02/2007  DT: 09/03/2007  Job #: [id number]

## 2010-06-05 NOTE — Op Note (Signed)
NAMEKALANA, YUST                            ACCOUNT NO.:  [id number]   MEDICAL RECORD NO.:  [id number]                   PATIENT TYPE:  AMB   LOCATION:  DAY                                  FACILITY:  [facility]   PHYSICIAN:  Abigail Miyamoto, M.D.              DATE OF BIRTH:  1951-01-14   DATE OF PROCEDURE:  10/01/2003  DATE OF DISCHARGE:                                 OPERATIVE REPORT   PREOPERATIVE DIAGNOSIS:  Biliary dyskinesia.   POSTOPERATIVE DIAGNOSIS:  Biliary dyskinesia.   PROCEDURE:  Laparoscopic cholecystectomy with intraoperative cholangiogram.   SURGEON:  Abigail Miyamoto, M.D.   ASSISTANT:  Sheppard Plumber. Earlene Plater, M.D.   ANESTHESIA:  General endotracheal anesthesia.   ESTIMATED BLOOD LOSS:  Minimal.   FINDINGS:  The patient was found to have a large, fatty-appearing liver.  There were no masses visualized and no evidence of any type of cirrhosis.  The gallbladder was chronically scarred in appearance with multiple  adhesions.  The rest of the peritoneal cavity appeared normal.  Cholangiogram was normal as well.   PROCEDURE IN DETAIL:  The patient was brought to the operating room and  identified as Flossie Dibble.  She was placed supine on the operating table and  general anesthesia was induced.  Her abdomen was then prepped and draped in  the usual sterile fashion.  Using a #15 blade, a small vertical incision was  made below the umbilicus.  The incision was carried down to the fascia,  which was opened with a scalpel.  A hemostat was then used to pass through  the peritoneal cavity.  Next a 0 Vicryl pursestring suture was passed around  the fascial opening.  The Hasson port was placed through the opening and  insufflation of the abdomen was begun.  A 12 mm port was then placed in the  patient's epigastrium and two 5 mm ports were placed in the patient's right  flank under direct vision.  The gallbladder was then identified.  There was  a single adhesion of omentum to  the abdominal wall, which was taken down  bluntly.  The gallbladder itself had multiple adhesions of omentum to it as  well, and these were taken down bluntly and with the electrocautery.  After  this was done, the gallbladder could be fully elevated.  The cystic duct was  easily dissected out with the cystic artery and a critical window was  achieved around both.  The artery was clipped twice proximally and once  distally and transected.  The duct was then clipped once distally and partly  opened with the laparoscopic scissors.  An angiocatheter was then inserted  in the right upper quadrant under direct vision.  The cholangiocatheter was  passed through this and placed into the opening in the cystic duct.  A  cholangiogram was then performed with contrast.  Contrast was seen to flow  into the entire  biliary system and duodenum without evidence of abnormality  or obstruction.  The cholangiocatheter was then completely removed.  The  cystic duct was then clipped three times proximally and completely  transected.  The gallbladder was then easily dissected free from the liver  bed with the electrocautery.  Once it was freed from the liver bed, the  liver bed was examined and hemostasis was again achieved with the cautery.  The gallbladder was then removed through the incision at the umbilicus.  The  0 Vicryl at the umbilicus was tied in place, closing the fascial defect.  The abdomen was then irrigated with normal saline.  Again hemostasis  appeared to be achieved.  All ports were then removed under direct vision,  and the abdomen was deflated.  All incisions were then anesthetized with  0.25% Marcaine and  closed with 4-0 Monocryl subcuticular sutures.  Steri-Strips, gauze, and  tape were then applied.  The patient tolerated the procedure well.  All  sponge, needle, and instrument counts were correct at the end of the  procedure.  The patient was then extubated in the operating room and  taken  in stable condition to the recovery room.                                               Abigail Miyamoto, M.D.    DB/MEDQ  D:  10/01/2003  T:  10/01/2003  Job:  [id number]   cc:   Anselmo Rod, M.D.  [address]  [address]  [place]  [place] [address]  Fax: [phone]

## 2010-06-05 NOTE — Op Note (Signed)
[name], [name]                            ACCOUNT NO.:  [id number]   MEDICAL RECORD NO.:  [id number]                   PATIENT TYPE:  AMB   LOCATION:  ENDO                                 FACILITY:  [facility]   PHYSICIAN:  Charna Elizabeth, M.D.                   DATE OF BIRTH:  [birth year]   DATE OF PROCEDURE:  10/12/2001  DATE OF DISCHARGE:                                 OPERATIVE REPORT   Patient AKA [name].   PROCEDURE PERFORMED:  Colonoscopy.   ENDOSCOPIST:  Charna Elizabeth, M.D.   INSTRUMENT USED:  Pediatric adjustable Olympus video colonoscope.   INDICATIONS FOR PROCEDURE:  The patient is a 60 year old white female with  history of rectal bleeding.  Rule out colonic polyps, masses, hemorrhoids,  etc.   PREPROCEDURE PREPARATION:  Informed consent was procured from the patient.  The patient was fasted for eight hours prior to the procedure and prepped  with a bottle of magnesium citrate and a gallon of NuLytely the night prior  to the procedure.   PREPROCEDURE PHYSICAL:  The patient had stable vital signs.  Neck supple.  Chest clear to auscultation.  S1 and S2 regular.  Abdomen soft with normal  bowel  sounds.   DESCRIPTION OF PROCEDURE:  The patient was placed in left lateral decubitus  position and sedated with 50 mg of Demerol and 5 mg of Versed intravenously.  Once the patient was adequately sedated and maintained on low flow oxygen  and continuous cardiac monitoring, the Olympus video colonoscope was  advanced from the rectum to the cecum and terminal ileum without difficulty.  Small nonbleeding internal hemorrhoids were seen on retroflexion.  The rest  of the colonic mucosa appeared healthy.  No masses, polyps or diverticula  were seen.  Small nonbleeding internal hemorrhoids were seen on  retroflexion.   IMPRESSION:  Normal colonoscopy up to the terminal ileum except for small  nonbleeding internal hemorrhoids.   RECOMMENDATIONS:  1. A high fiber diet has  been recommended with liberal fluid intake.  2.     Repeat colorectal cancer screening has been scheduled for the patient in the     next five years unless the patient develops any abnormal symptoms in the     interim.  3. Outpatient follow-up as need arises.                                                   Charna Elizabeth, M.D.    JM/MEDQ  D:  10/12/2001  T:  10/12/2001  Job:  [id number]   cc:   Earlene Plater L. Cloward, M.D.  [address]  [place]  [place] [address]  Fax: [phone]   [place]. Daphine Deutscher, M.D.  Fax:  [phone]  

## 2010-06-05 NOTE — Op Note (Signed)
[place]  Patient:    [name], [employer] Visit Number: [id number] MRN: [id number]          Service Type: END Location: ENDO Attending Physician:  Charna Elizabeth Dictated by:   Anselmo Rod, M.D. Proc. Date: 04/27/01 Admit Date:  04/26/2001 Discharge Date: 04/26/2001   CC:         Gabriel Earing, M.D.   Operative Report  DATE OF BIRTH:  Sep 10, 1950  REFERRING PHYSICIAN:  Laban Emperor. Cloward, M.D.  PROCEDURE PERFORMED:  Esophagogastroduodenoscopy with biopsies.  ENDOSCOPIST:  Anselmo Rod, M.D.  INSTRUMENT USED:  Olympus video panendoscope.  INDICATIONS FOR PROCEDURE:  Epigastric pain with nausea, vomiting and guaiac positive stools in a 60 year old white female rule out peptic ulcer disease, esophagitis, gastritis, etc.  PREPROCEDURE PREPARATION:  Informed consent was procured from the patient. The patient was fasted for eight hours prior to the procedure.  PREPROCEDURE PHYSICAL:  The patient had stable vital signs.  Neck supple. Chest clear to auscultation.  S1, S2 regular.  Abdomen soft with normal abdominal bowel sounds.  DESCRIPTION OF PROCEDURE:  The patient was placed in left lateral decubitus position and sedated with 50 mg of Demerol and 4 mg of Versed intravenously. Once the patient was adequately sedated and maintained on low-flow oxygen and continuous cardiac monitoring, the Olympus video panendoscope was advanced through the mouthpiece, over the tongue, into the esophagus under direct vision.  The entire esophagus was widely patent and appeared healthy without evidence of esophagitis, Barretts mucosa or strictures.  The scope was then advanced to the stomach.  A large hiatal hernia was seen on retroflexion. There was antral gastritis with a few erosions seen.  Biopsies were done to rule out presence of Helicobacter pylori for pathology.  The rest of the gastric mucosa in the proximal small bowel appeared normal.  There was  no outlet obstruction.  The patient tolerated the procedure well without complications.  IMPRESSION: 1. Large hiatal hernia. 2. Antral gastritis with erosions, biopsies done for Helicobacter pylori. 3. Otherwise normal-appearing cardia, midbody of the stomach and proximal    small bowel.  RECOMMENDATION: 1. Await pathology results. 2. Continue PPIs. 3. Outpatient follow-up with repeat guaiac testing.  Further recommendations    to be made thereafter. Dictated by:   Anselmo Rod, M.D. Attending Physician:  Charna Elizabeth DD:  04/27/01 TD:  04/28/01 Job: [id number]

## 2010-06-05 NOTE — Op Note (Signed)
[facility]  Patient:    [name], [name] Visit Number: [id number] MRN: [id number]          Service Type: SUR Location: 4W 0473 01 Attending Physician:  [name] Dictated by:   [name], M.D. Proc. Date: 07/26/01 Admit Date:  07/26/2001 Discharge Date: 07/28/2001   CC:         [name], M.D.  [name], M.D.   Operative Report  PREOPERATIVE DIAGNOSIS:  Gastroesophageal reflux with hiatal hernia.  POSTOPERATIVE DIAGNOSIS:  Gastroesophageal reflux with a marked degree of esophagitis externally and moderate sliding hiatal hernia.  OPERATION PERFORMED:  SURGEON:  [name], M.D.  ASSISTANT:  [name], M.D.  ANESTHESIA:  General endotracheal.  DESCRIPTION OF PROCEDURE:  [name] is a 60 year old recently wed lady with a several year history of gastroesophageal reflux, particularly nocturnal reflux when things roll into her mouth and will have some choking episodes.  Upper GI showed hiatal hernia with reflux and [name] endoscoped her and this indicated that she had an actually moderately sized hiatal hernia.  She had a Demeester score of 57.  After general anesthesia was administered, the abdomen was prepped with Betadine and draped sterilely.  A longitudinal incision was made above the umbilicus.  The midline was identified and through a pursestring suture I entered the abdomen without difficulty using the Hasson cannula.  A 30 degree scope was placed.  Two 10-11s were placed on the right side, a 5 mm in the upper midline and another in the left upper quadrant.  The picture in the chart shows a portion of the stomach up in the hiatal hernia.  Initially, the gastrohepatic window was opened and the right crus was dissected.  I carried this anteriorly and then retroesophageally.  I actually went over to the greater curvature side and took down short gastrics.  This proceeded in an uneventful  fashion up to the point of the hiatal hernia where the stomach was markedly adherent from chronic inflammatory changes _______ to the left crus.  A section to the left crus was particularly stuck and did require a little more time and we took that time to free this up and completely mobilize the esophagogastric junction there.  I was then able to get behind the esophagogastric junction with a Penrose drain and use that for retraction to then dissect the esophagus from the surrounding tissue to gain esophageal length and I freed up esophagus on both sides.  When this had been done and I then asked them to pass a lighted 50 bougie which I used to give an estimate of the size.  This was done and after I had already placed one suture.  I then placed two more sutures in the hiatus to close this nice and snugly.  These were tied extracorporeally.  I then went behind in the retroesophageal window and went up and grasped a portion of the fundus and pulled that around.  It was totally without any tension as there had been a good complete mobilization in the cardia and fundus.  I then grabbed a contiguous portion of fundus on the other side and invaginated the distal esophagus.  This was then sutured with three sutures using the endo stitch again tying these intracorporeally.  Good purchases of the esophagus were taken of each bite and this was once tied down, a good intact, healthy-appearing wrap was present.  Clips were placed on all the knots.  There  was no bleeding noted.  No evidence of any perforation or other injury.  We surveyed the abdomen and stomach.  Again, no other abnormality was noted.  Upon entering the abdomen, I did survey the right lobe of the liver and was unable to see the hemangiomas that had been described.  The gallbladder looked normal.  Prior to removing the scope, I did tie down the pursestring suture in the midline.  All the port sites were injected with Marcaine, the  abdomen was deflated and then the skin was closed with 4-0 Vicryl with benzoin and Steri-Strips on the skin.  The patient seemed to tolerate the procedure well and was taken to the recovery room in satisfactory condition. Dictated by:   [name], M.D. Attending Physician:  [name] DD:  07/26/01 TD:  07/28/01 Job: [id number]

## 2010-06-05 NOTE — Discharge Summary (Signed)
   [name], [name]                            ACCOUNT NO.:  [id number]   MEDICAL RECORD NO.:  [id number]                   PATIENT TYPE:  INP   LOCATION:  0473                                 FACILITY:  [facility]   PHYSICIAN:  Th[place] Park. Daphine Deutscher, M.D.             DATE OF BIRTH:  05-04-1950   DATE OF ADMISSION:  07/26/2001  DATE OF DISCHARGE:  07/28/2001                                 DISCHARGE SUMMARY   ADMITTING DIAGNOSIS:  Chronic gastroesophageal reflux disease.   PROCEDURE:  Lap Nissen with closure of the hiatus.   FINDINGS:  Marked esophagitis with a moderately enlarged hiatal hernia.   COURSE IN THE HOSPITAL:  The patient underwent an outpatient lap Nissen on  July 26, 2001.  Postoperatively, she did well.  She was begun on liquids and  was ready for discharge on July 28, 2001 doing well.   FINAL DIAGNOSIS:  Status post repair of hiatal hernia and laparoscopic  Nissen fundoplication.                                                Thornton Park Daphine Deutscher, M.D.    MBM/MEDQ  D:  08/16/2001  T:  08/22/2001  Job:  [id number]

## 2010-06-16 ENCOUNTER — Encounter (HOSPITAL_COMMUNITY): Payer: 59

## 2010-06-18 ENCOUNTER — Ambulatory Visit (HOSPITAL_COMMUNITY)
Admission: RE | Admit: 2010-06-18 | Discharge: 2010-06-18 | Disposition: A | Discharge status: Home / Self Care | Payer: 59 | Source: Ambulatory Visit | Attending: Internal Medicine | Admitting: Internal Medicine

## 2010-06-18 DIAGNOSIS — D869 Sarcoidosis, unspecified: Secondary | ICD-10-CM | POA: Insufficient documentation

## 2010-08-04 NOTE — Op Note (Signed)
  [name], [name]                  ACCOUNT NO.:  [id number]  MEDICAL RECORD NO.:  [id number]  LOCATION:                                 FACILITY:  PHYSICIAN:  Sandria Bales. Ezzard Standing, M.D.  DATE OF BIRTH:  1950/04/02  DATE OF PROCEDURE: 10 Jun 2010                              OPERATIVE REPORT   PREOPERATIVE DIAGNOSIS:  Prior Nissen fundoplication with recurrent symptoms/hiatal hernia.  POSTOPERATIVE DIAGNOSIS:  Laparoscopic repair of hiatal hernia with redone Nissen.  PROCEDURE:  Esophagogastrostomy.  SURGEON:  Sandria Bales. Ezzard Standing, M.D.  FIRST ASSISTANT:  None.  ANESTHESIA:  General endotracheal.  ESTIMATED BLOOD LOSS:  None.  INDICATION FOR PROCEDURE:  [name] is a 60 year old female, who is a patient of Dr. Wenda Low, who had a Nissen fundoplication done in 2003, and now has recurrent reflux.  She did well until she got sick and herniated part of the wrap that appears to be above the diaphragm.  I have assisted Dr. Wenda Low with the laparoscopic dissection and take down of her wrap, reapproximating the stomach into the abdomen, and  with a repeat Nissen fundoplication.  I am doing the upper endoscopy to document the location of the esophagus and make sure there has been no injury to the esophagus/stomach.  DESCRIPTION OF PROCEDURE:  The patient is under general anesthesia.  Dr. Wenda Low is manning the laroscope.  I passed a flexible Olympus endoscope down the esophagus without difficulty, identified a normal esophagus.  I did identify the Z line. The wrap appeared below the diaphragm.    Dr. Daphine Deutscher infused saline laparoscopically while I pumped air into the GI tract.  The upper abdomen shows air bubbles/leak. The stomach was back in the abdomen.  The wrap was in good position.  The esophagogastric junction was below the hiatus and  remainder of her stomach was unremarkable.  The patient tolerated the procedure well  Dr. Zenon Mayo will dictae the remainder of the  operation.   Sandria Bales. Ezzard Standing, M.D., FACS   DHN/MEDQ  D:  06/10/2010  T:  06/10/2010  Job:  [id number]  cc:   Thorn[place] Park Daphine Deutscher, MD [address], [address] [place] [address]  Electronically Signed by Ovidio Kin M.D. on 08/04/2010 10:50:37 AM

## 2010-10-15 LAB — COMPREHENSIVE METABOLIC PANEL
Alkaline Phosphatase: 112
BUN: 17
Chloride: 107
Creatinine, Ser: 0.68
Glucose, Bld: 131 — ABNORMAL HIGH
Potassium: 4.2
Total Bilirubin: 0.7
Total Protein: 6.7

## 2010-10-15 LAB — CBC
Hemoglobin: 14
MCHC: 34
MCHC: 34.5
MCV: 86.6
Platelets: 152
Platelets: 160
RDW: 12.8

## 2010-10-15 LAB — DIFFERENTIAL
Basophils Absolute: 0.1
Basophils Relative: 1
Eosinophils Absolute: 0.2
Neutro Abs: 2.6
Neutrophils Relative %: 46

## 2010-10-15 LAB — POCT CARDIAC MARKERS
CKMB, poc: 2.5
CKMB, poc: 2.9
Myoglobin, poc: 50.6
Operator id: 270651
Operator id: 270651
Troponin i, poc: <0.05
Troponin i, poc: <0.05
Troponin i, poc: <0.05

## 2010-10-15 LAB — POCT I-STAT, CHEM 8
Calcium, Ion: 1.14
Glucose, Bld: 107 — ABNORMAL HIGH
HCT: 40
Hemoglobin: 13.6
TCO2: 24

## 2010-10-15 LAB — CARDIAC PANEL(CRET KIN+CKTOT+MB+TROPI)
CK, MB: 2.6
CK, MB: 2.8
Relative Index: INVALID
Relative Index: INVALID
Relative Index: INVALID
Total CK: 57
Troponin I: 0.01
Troponin I: <0.01

## 2010-10-15 LAB — CK TOTAL AND CKMB (NOT AT ARMC): Total CK: 79

## 2010-10-15 LAB — LIPID PANEL: Cholesterol: 182

## 2010-10-15 LAB — D-DIMER, QUANTITATIVE: D-Dimer, Quant: <0.22

## 2010-10-15 LAB — PROTIME-INR
INR: 1
Prothrombin Time: 12.9

## 2010-10-15 LAB — APTT: aPTT: 34

## 2012-06-22 ENCOUNTER — Emergency Department (HOSPITAL_COMMUNITY): Payer: BC Managed Care – PPO

## 2012-06-22 ENCOUNTER — Encounter (HOSPITAL_COMMUNITY): Payer: Self-pay | Admitting: Emergency Medicine

## 2012-06-22 ENCOUNTER — Observation Stay (HOSPITAL_COMMUNITY)
Admission: EM | Admit: 2012-06-22 | Discharge: 2012-06-23 | Disposition: A | Discharge status: Home / Self Care | Payer: BC Managed Care – PPO | Attending: Internal Medicine | Admitting: Internal Medicine

## 2012-06-22 DIAGNOSIS — IMO0001 Reserved for inherently not codable concepts without codable children: Secondary | ICD-10-CM | POA: Insufficient documentation

## 2012-06-22 DIAGNOSIS — J45909 Unspecified asthma, uncomplicated: Secondary | ICD-10-CM | POA: Insufficient documentation

## 2012-06-22 DIAGNOSIS — I10 Essential (primary) hypertension: Secondary | ICD-10-CM | POA: Insufficient documentation

## 2012-06-22 DIAGNOSIS — Z79899 Other long term (current) drug therapy: Secondary | ICD-10-CM | POA: Insufficient documentation

## 2012-06-22 DIAGNOSIS — K5289 Other specified noninfective gastroenteritis and colitis: Secondary | ICD-10-CM | POA: Insufficient documentation

## 2012-06-22 DIAGNOSIS — I951 Orthostatic hypotension: Secondary | ICD-10-CM

## 2012-06-22 DIAGNOSIS — E039 Hypothyroidism, unspecified: Secondary | ICD-10-CM | POA: Insufficient documentation

## 2012-06-22 DIAGNOSIS — N179 Acute kidney failure, unspecified: Secondary | ICD-10-CM | POA: Diagnosis present

## 2012-06-22 DIAGNOSIS — E860 Dehydration: Secondary | ICD-10-CM | POA: Insufficient documentation

## 2012-06-22 DIAGNOSIS — D869 Sarcoidosis, unspecified: Secondary | ICD-10-CM | POA: Insufficient documentation

## 2012-06-22 DIAGNOSIS — R55 Syncope and collapse: Principal | ICD-10-CM | POA: Diagnosis present

## 2012-06-22 DIAGNOSIS — K219 Gastro-esophageal reflux disease without esophagitis: Secondary | ICD-10-CM | POA: Insufficient documentation

## 2012-06-22 DIAGNOSIS — R011 Cardiac murmur, unspecified: Secondary | ICD-10-CM | POA: Insufficient documentation

## 2012-06-22 DIAGNOSIS — E1165 Type 2 diabetes mellitus with hyperglycemia: Secondary | ICD-10-CM | POA: Diagnosis present

## 2012-06-22 DIAGNOSIS — N390 Urinary tract infection, site not specified: Secondary | ICD-10-CM | POA: Diagnosis present

## 2012-06-22 HISTORY — DX: Sarcoidosis of lung: D86.0

## 2012-06-22 HISTORY — DX: Sarcoidosis of lymph nodes: D86.1

## 2012-06-22 HISTORY — DX: Adverse effect of unspecified anesthetic, initial encounter: T41.45XA

## 2012-06-22 HISTORY — DX: Type 2 diabetes mellitus without complications: E11.9

## 2012-06-22 HISTORY — DX: Other complications of anesthesia, initial encounter: T88.59XA

## 2012-06-22 HISTORY — DX: Sarcoidosis, unspecified: D86.9

## 2012-06-22 HISTORY — DX: Essential (primary) hypertension: I10

## 2012-06-22 HISTORY — DX: Liver disease, unspecified: K76.9

## 2012-06-22 HISTORY — DX: Hypothyroidism, unspecified: E03.9

## 2012-06-22 LAB — CBC WITH DIFFERENTIAL/PLATELET
Basophils Absolute: 0.1 10*3/uL (ref 0.0–0.1)
Eosinophils Absolute: 0.1 10*3/uL (ref 0.0–0.7)
Eosinophils Relative: 1 % (ref 0–5)
MCH: 30 pg (ref 26.0–34.0)
MCHC: 35.2 g/dL (ref 30.0–36.0)
MCV: 85.3 fL (ref 78.0–100.0)
Platelets: 212 10*3/uL (ref 150–400)
RDW: 12.8 % (ref 11.5–15.5)

## 2012-06-22 LAB — COMPREHENSIVE METABOLIC PANEL
ALT: 62 U/L — ABNORMAL HIGH (ref 0–35)
AST: 54 U/L — ABNORMAL HIGH (ref 0–37)
Calcium: 11.2 mg/dL — ABNORMAL HIGH (ref 8.4–10.5)
GFR calc Af Amer: 38 mL/min — ABNORMAL LOW (ref >90)
Glucose, Bld: 107 mg/dL — ABNORMAL HIGH (ref 70–99)
Sodium: 140 mEq/L (ref 135–145)
Total Protein: 8.4 g/dL — ABNORMAL HIGH (ref 6.0–8.3)

## 2012-06-22 LAB — URINE MICROSCOPIC-ADD ON

## 2012-06-22 LAB — URINALYSIS, ROUTINE W REFLEX MICROSCOPIC
Specific Gravity, Urine: 1.019 (ref 1.005–1.030)
Urobilinogen, UA: 0.2 mg/dL (ref 0.0–1.0)

## 2012-06-22 LAB — GLUCOSE, CAPILLARY: Glucose-Capillary: 176 mg/dL — ABNORMAL HIGH (ref 70–99)

## 2012-06-22 LAB — TROPONIN I: Troponin I: <0.3 ng/mL (ref <0.30)

## 2012-06-22 MED ORDER — HEPARIN SODIUM (PORCINE) 5000 UNIT/ML IJ SOLN
5000.0000 [IU] | Freq: Three times a day (TID) | INTRAMUSCULAR | Status: DC
  Administered 2012-06-22 – 2012-06-23 (×2): 5000 [IU] via SUBCUTANEOUS
  Filled 2012-06-22 (×5): qty 1

## 2012-06-22 MED ORDER — OXYCODONE HCL 5 MG PO TABS: 5.0000 mg | ORAL_TABLET | ORAL | Status: DC | PRN

## 2012-06-22 MED ORDER — LORATADINE 10 MG PO TABS
10.0000 mg | ORAL_TABLET | Freq: Every day | ORAL | Status: DC
  Administered 2012-06-23: 10 mg via ORAL
  Filled 2012-06-22: qty 1

## 2012-06-22 MED ORDER — ONDANSETRON HCL 4 MG PO TABS: 4.0000 mg | ORAL_TABLET | Freq: Four times a day (QID) | ORAL | Status: DC | PRN

## 2012-06-22 MED ORDER — SODIUM CHLORIDE 0.9 % IV BOLUS (SEPSIS)
1000.0000 mL | Freq: Once | INTRAVENOUS | Status: AC
  Administered 2012-06-22: 1000 mL via INTRAVENOUS

## 2012-06-22 MED ORDER — PANTOPRAZOLE SODIUM 40 MG IV SOLR
40.0000 mg | Freq: Every day | INTRAVENOUS | Status: DC
  Administered 2012-06-22: 40 mg via INTRAVENOUS
  Filled 2012-06-22 (×2): qty 40

## 2012-06-22 MED ORDER — ONDANSETRON HCL 4 MG/2ML IJ SOLN: 4.0000 mg | Freq: Three times a day (TID) | INTRAMUSCULAR | Status: DC | PRN

## 2012-06-22 MED ORDER — ZOLPIDEM TARTRATE 5 MG PO TABS: 5.0000 mg | ORAL_TABLET | Freq: Every evening | ORAL | Status: DC | PRN

## 2012-06-22 MED ORDER — LEVOTHYROXINE SODIUM 175 MCG PO TABS
175.0000 ug | ORAL_TABLET | ORAL | Status: DC
  Filled 2012-06-22: qty 1

## 2012-06-22 MED ORDER — ACETAMINOPHEN 325 MG PO TABS: 650.0000 mg | ORAL_TABLET | Freq: Four times a day (QID) | ORAL | Status: DC | PRN

## 2012-06-22 MED ORDER — INSULIN ASPART 100 UNIT/ML ~~LOC~~ SOLN: 0.0000 [IU] | Freq: Every day | SUBCUTANEOUS | Status: DC

## 2012-06-22 MED ORDER — ONDANSETRON HCL 4 MG/2ML IJ SOLN
4.0000 mg | Freq: Once | INTRAMUSCULAR | Status: AC
  Administered 2012-06-22: 4 mg via INTRAVENOUS
  Filled 2012-06-22: qty 2

## 2012-06-22 MED ORDER — SODIUM CHLORIDE 0.9 % IV SOLN
INTRAVENOUS | Status: AC
  Administered 2012-06-22: 18:00:00 via INTRAVENOUS

## 2012-06-22 MED ORDER — ONDANSETRON HCL 4 MG/2ML IJ SOLN: 4.0000 mg | Freq: Four times a day (QID) | INTRAMUSCULAR | Status: DC | PRN

## 2012-06-22 MED ORDER — SODIUM CHLORIDE 0.9 % IV SOLN
INTRAVENOUS | Status: DC
  Administered 2012-06-22 – 2012-06-23 (×2): via INTRAVENOUS

## 2012-06-22 MED ORDER — MORPHINE SULFATE 2 MG/ML IJ SOLN: 2.0000 mg | INTRAMUSCULAR | Status: DC | PRN

## 2012-06-22 MED ORDER — LEVOTHYROXINE SODIUM 150 MCG PO TABS
150.0000 ug | ORAL_TABLET | ORAL | Status: DC
  Administered 2012-06-23: 150 ug via ORAL
  Filled 2012-06-22: qty 1

## 2012-06-22 MED ORDER — DEXTROSE 5 % IV SOLN
1.0000 g | Freq: Every day | INTRAVENOUS | Status: DC
  Administered 2012-06-22: 1 g via INTRAVENOUS
  Filled 2012-06-22 (×2): qty 10

## 2012-06-22 MED ORDER — SODIUM CHLORIDE 0.9 % IJ SOLN: 3.0000 mL | Freq: Two times a day (BID) | INTRAMUSCULAR | Status: DC

## 2012-06-22 MED ORDER — ACETAMINOPHEN 650 MG RE SUPP: 650.0000 mg | Freq: Four times a day (QID) | RECTAL | Status: DC | PRN

## 2012-06-22 MED ORDER — INSULIN ASPART 100 UNIT/ML ~~LOC~~ SOLN: 0.0000 [IU] | Freq: Three times a day (TID) | SUBCUTANEOUS | Status: DC

## 2012-06-22 NOTE — ED Notes (Signed)
Pt denies pain , denies shortness of breath, only complaint is weakness in both arms. Pt ambulated to ED

## 2012-06-22 NOTE — ED Notes (Signed)
Pt aware of need of urine sample. Unable to produce at this time

## 2012-06-22 NOTE — ED Notes (Signed)
Increased weakness, nausea and epigastric pain reviewed with EDP

## 2012-06-22 NOTE — ED Provider Notes (Signed)
History     CSN: [id number]  Arrival date & time 06/22/12  1415   First MD Initiated Contact with Patient 06/22/12 1501      Chief Complaint  Patient presents with  . Nausea  . Emesis    vomited x 1  . Weakness  . Diarrhea    x1   . Near Syncope    weakness for 45 minutes   . Loss of Consciousness    pt stated that "passed out for 5-10 minutes"    (Consider location/radiation/quality/duration/timing/severity/associated sxs/prior treatment) HPI Comments: Presents after syncopal episode and that happened after riding her horse. She states she was running from our in the woods out of the sun when she all of a sudden began to feel generally weak and nauseated. She complained of being "foggy headed" and had a sip of her horse where she had vomiting and diarrhea. She then tried to walk to the road that became lightheaded and passed out hitting head on the grass. She states she was out for about 5 minutes. Denies any chest pain or shortness of breath. States he felt well prior to her ride today. She states she's been eating and drinking well. No fevers. She is a diabetic on metformin states her sugar was 160. She denies being outside for prolonged period of time. She denies feeling hot and sweaty. She presented to urgent care and was referred to the ED.  The history is provided by the patient and the EMS personnel.    Past Medical History  Diagnosis Date  . Diabetes mellitus without complication   . Hypertension   . Sarcoidosis   . Hypothyroidism   . Sarcoidosis of lung   . Sarcoidosis of lymph nodes   . 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 fatty liver  . Complication of anesthesia     trouble waking up once    Past Surgical History  Procedure Laterality Date  . Abdominal surgery    . Abdominal hysterectomy    . Cholecystectomy      Family History  Problem Relation Age of Onset  . Heart failure Father   . Hypertension Father     History  Substance Use Topics  .  Smoking status: Never Smoker   . Smokeless tobacco: Never Used  . Alcohol Use: No    OB History   Grav Para Term Preterm Abortions TAB SAB Ect Mult Living                  Review of Systems  Constitutional: Positive for activity change, appetite change and fatigue. Negative for fever.  HENT: Negative for congestion and rhinorrhea.   Respiratory: Negative for cough, chest tightness and shortness of breath.   Cardiovascular: Positive for syncope. Negative for chest pain.  Gastrointestinal: Positive for nausea, vomiting and diarrhea. Negative for abdominal pain.  Genitourinary: Negative for dysuria, vaginal bleeding and vaginal discharge.  Musculoskeletal: Negative for back pain.  Skin: Negative for rash.  Neurological: Positive for dizziness, syncope, weakness and light-headedness. Negative for seizures.  A complete 10 system review of systems was obtained and all systems are negative except as noted in the HPI and PMH.    Allergies  Review of patient's allergies indicates no known allergies.  Home Medications   No current outpatient prescriptions on file.  BP 122/54  Pulse 84  Temp(Src) 98.1 F (36.7 C) (Oral)  Resp 20  Ht 5\' 2"  (1.575 m)  Wt 177 lb 0.5  oz (80.3 kg)  BMI 32.37 kg/m2  SpO2 97%  Physical Exam  Constitutional: She is oriented to person, place, and time. She appears well-developed and well-nourished. No distress.  HENT:  Head: Normocephalic and atraumatic.  Mouth/Throat: Oropharynx is clear and moist. No oropharyngeal exudate.  Eyes: Conjunctivae and EOM are normal. Pupils are equal, round, and reactive to light.  Neck: Normal range of motion. Neck supple.  Cardiovascular: Normal rate and regular rhythm.   Murmur heard. Systolic murmur, patient states is new to her  Pulmonary/Chest: Effort normal and breath sounds normal. No respiratory distress.  Abdominal: Soft. There is no tenderness. There is no rebound and no guarding.  Musculoskeletal: Normal  range of motion. She exhibits no edema and no tenderness.  Neurological: She is alert and oriented to person, place, and time. No cranial nerve deficit. She exhibits normal muscle tone. Coordination normal.  5/5 strength throughout, no ataxia on finger to nose  Skin: Skin is warm.    ED Course  Procedures (including critical care time)  Labs Reviewed  GLUCOSE, CAPILLARY - Abnormal; Notable for the following:    Glucose-Capillary 113 (*)    All other components within normal limits  CBC WITH DIFFERENTIAL - Abnormal; Notable for the following:    WBC 13.7 (*)    RBC 5.23 (*)    Hemoglobin 15.7 (*)    Neutro Abs 10.4 (*)    Monocytes Absolute 1.4 (*)    All other components within normal limits  COMPREHENSIVE METABOLIC PANEL - Abnormal; Notable for the following:    Glucose, Bld 107 (*)    BUN 25 (*)    Creatinine, Ser 1.61 (*)    Calcium 11.2 (*)    Total Protein 8.4 (*)    AST 54 (*)    ALT 62 (*)    Alkaline Phosphatase 127 (*)    GFR calc non Af Amer 33 (*)    GFR calc Af Amer 38 (*)    All other components within normal limits  URINALYSIS, ROUTINE W REFLEX MICROSCOPIC - Abnormal; Notable for the following:    APPearance CLOUDY (*)    Hgb urine dipstick MODERATE (*)    Protein, ur >300 (*)    Leukocytes, UA SMALL (*)    All other components within normal limits  URINE MICROSCOPIC-ADD ON - Abnormal; Notable for the following:    Squamous Epithelial / LPF FEW (*)    Bacteria, UA MANY (*)    Casts GRANULAR CAST (*)    All other components within normal limits  GLUCOSE, CAPILLARY - Abnormal; Notable for the following:    Glucose-Capillary 176 (*)    All other components within normal limits  URINE CULTURE  URINE CULTURE  TROPONIN I  TROPONIN I  TROPONIN I  TROPONIN I  TSH  COMPREHENSIVE METABOLIC PANEL  CBC   Dg Chest 2 View  06/22/2012   *RADIOLOGY REPORT*  Clinical Data: Syncope.  Nonsmoker.  History of asthma.  CHEST - 2 VIEW  Comparison: 08/27/2009.  Findings:  Normal sized heart.  Clear lungs.  Mild central peribronchial thickening without significant change.  Unremarkable bones.  IMPRESSION: No acute abnormality.  Stable mild chronic bronchitic changes.   Original Report Authenticated By: Beckie Salts, M.D.   Ct Head Wo Contrast  06/22/2012   *RADIOLOGY REPORT*  Clinical Data: Syncope. 62 year old female.  Nausea vomiting weakness diarrhea.  CT HEAD WITHOUT CONTRAST  Technique:  Contiguous axial images were obtained from the base of the skull through the  vertex without contrast.  Comparison: Brain MRI 11/01/2009 and earlier.  Findings: Minimal right posterior ethmoid sinus bubbly opacity. Other Visualized paranasal sinuses and mastoids are clear. Visualized orbits and scalp soft tissues are within normal limits. No acute osseous abnormality identified.  Stable and normal for age cerebral volume.  No ventriculomegaly. No midline shift, mass effect, or evidence of mass lesion.  No evidence of cortically based acute infarction identified.  No acute intracranial hemorrhage identified.  No suspicious intracranial vascular hyperdensity.  IMPRESSION: Stable and normal for age noncontrast CT appearance of the brain.   Original Report Authenticated By: Erskine Speed, M.D.     1. Syncope   2. Orthostatic hypotension   3. AKI (acute kidney injury)   4. Dehydration   5. Type II or unspecified type diabetes mellitus without mention of complication, uncontrolled       MDM  Generalized weakness with nausea, vomiting and diarrhea followed by syncopal episode. No chest pain or shortness of breath. Denies any unusual foods or abdominal pain.   Orthostatics are positive. Heart rate went to 110 with standing. Hemoglobin appears hemoconcentrated. Acute renal failure with hypercalcemia.  Suspects patient's symptoms are due to vasovagal syncope and dehydration. She does have a murmur but believe this is incidental finding as she did have a prodrome of dizziness,  lightheadedness, nausea and weakness. No chest pain or SOB. Neurologically intact.  Will admit for continued IV hydration and cycling of cardiac enzymes.   Date: 06/22/2012  Rate: 103  Rhythm: sinus tachycardia  QRS Axis: normal  Intervals: normal  ST/T Wave abnormalities: normal  Conduction Disutrbances:none  Narrative Interpretation:   Old EKG Reviewed: none available       Glynn Octave, MD 06/23/12 0020

## 2012-06-22 NOTE — ED Notes (Signed)
Pt c/o heaviness in arms and legs and felt "head rush" when lifting legs onto bed

## 2012-06-22 NOTE — ED Notes (Signed)
Pt reports that she was out riding a horse for 5 minutes then felt dizzy. Pt got off horse and reports n/v/d x1. Pt  C/o "passing out for 5-59min" Seen at Urgent Care- referred to ED.

## 2012-06-22 NOTE — H&P (Signed)
PCP:   Ralene Ok, MD   Chief Complaint:  Syncopal episode.   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 Patient is an excellent historian, and according to her, she was perfectly fine, when she woke up in the AM of 06/22/12. She went horse riding at about 11:00 AM, rode in the woods with 2 other companions. About 45 minutes into the ride, she felt unwell, so the stopped, she got off her horse, and sat on the ground for a few minutes, then became severely nauseated, vomited once, had a single profuse watery diarrhea. No abdominal pain, fever or chills. She persuaded her companions to return the horses and fetch the car. After they had gone, she waited about 15 minutes, then got up, and started walking, then became very dizzy, and must have "passed out. She does not remember falling, but when she came to, after about 5 minutes, she was on the ground, felt cold and sweaty, and had been incontinent of urine. She had no antecedent chest pain or palpitations. Her companions came back about 20 minutes later, took her home, she washed up, changed her clothes, and was taken to the [facility], where she was evaluated, and referred to the ED. In the ED, she was found to be orthostatic, with creatinine 1.61, BUN 25. U/A revealed pyuria and many bacteria.   Allergies:  No Known Allergies    Past Medical History  Diagnosis Date  . Diabetes mellitus without complication   . Hypertension   . Sarcoidosis   . Hypothyroidism   . Sarcoidosis of lung   . Sarcoidosis of lymph nodes   . 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 fatty liver  . Complication of anesthesia     trouble waking up once    Past Surgical History  Procedure Laterality Date  . Abdominal surgery    . Abdominal hysterectomy     . Cholecystectomy      Prior to Admission medications   Medication Sig Start Date End Date Taking? Authorizing Provider  levothyroxine (SYNTHROID, LEVOTHROID) 150 MCG tablet Take 150 mcg by mouth daily before breakfast. Pt alternates between 150 mcg and 175 mcg   Yes Historical Provider, MD  levothyroxine (SYNTHROID, LEVOTHROID) 175 MCG tablet Take 175 mcg by mouth daily before breakfast. Pt alternates between 175 mcg and 150 mcg   Yes Historical Provider, MD  loratadine (CLARITIN) 10 MG tablet Take 10 mg by mouth daily.   Yes Historical Provider, MD  metFORMIN (GLUCOPHAGE) 500 MG tablet Take 500 mg by mouth 2 (two) times daily with a meal.   Yes Historical Provider, MD  olmesartan-hydrochlorothiazide (BENICAR HCT) 20-12.5 MG per tablet Take 1 tablet by mouth daily.   Yes Historical Provider, MD  zolpidem (AMBIEN) 10 MG tablet Take 10 mg by mouth at bedtime as needed for sleep.   Yes Historical Provider, MD    Social History: Patient reports that she has never smoked. She has never used smokeless tobacco. She reports that she does not drink alcohol or use illicit drugs.  Family History  Problem Relation Age of Onset  . Heart failure Father   . Hypertension Father     Review of Systems:  As per HPI and chief complaint. Patent denies fatigue, diminished appetite, weight loss, fever, chills, headache, blurred vision, difficulty in  speaking, dysphagia, chest pain, cough, shortness of breath, orthopnea, paroxysmal nocturnal dyspnea, nausea, diaphoresis, abdominal pain, hematemesis, melena, dysuria, nocturia, urinary frequency, hematochezia, lower extremity swelling, pain, or redness. The rest of the systems review is negative.  Physical Exam:  General:  Patient does not appear to be in obvious acute distress. Alert, communicative, fully oriented, talking in complete sentences, not short of breath at rest.  HEENT:  No clinical pallor, no jaundice, no conjunctival injection or discharge.  Visible buccal mucosa appears "dry".  NECK:  Supple, JVP not seen, no carotid bruits, no palpable lymphadenopathy, no palpable goiter. CHEST:  Clinically clear to auscultation, no wheezes, no crackles. HEART:  Sounds 1 and 2 heard, normal, regular, soft systolic murmur at the apex. ABDOMEN:  Full, soft, non-tender, no palpable organomegaly, no palpable masses, normal bowel sounds. GENITALIA:  Not examined. LOWER EXTREMITIES:  No pitting edema, palpable peripheral pulses. MUSCULOSKELETAL SYSTEM:  Generalized osteoarthritic changes, otherwise, normal. CENTRAL NERVOUS SYSTEM:  No focal neurologic deficit on gross examination.  Labs on Admission:  Results for orders placed during the hospital encounter of 06/22/12 (from the past 48 hour(s))  GLUCOSE, CAPILLARY     Status: Abnormal   Collection Time    06/22/12  2:53 PM      Result Value Range   Glucose-Capillary 113 (*) 70 - 99 mg/dL  CBC WITH DIFFERENTIAL     Status: Abnormal   Collection Time    06/22/12  4:00 PM      Result Value Range   WBC 13.7 (*) 4.0 - 10.5 K/uL   RBC 5.23 (*) 3.87 - 5.11 MIL/uL   Hemoglobin 15.7 (*) 12.0 - 15.0 g/dL   HCT 16.1  09.6 - 04.5 %   MCV 85.3  78.0 - 100.0 fL   MCH 30.0  26.0 - 34.0 pg   MCHC 35.2  30.0 - 36.0 g/dL   RDW 40.9  81.1 - 91.4 %   Platelets 212  150 - 400 K/uL   Neutrophils Relative % 76  43 - 77 %   Neutro Abs 10.4 (*) 1.7 - 7.7 K/uL   Lymphocytes Relative 12  12 - 46 %   Lymphs Abs 1.7  0.7 - 4.0 K/uL   Monocytes Relative 10  3 - 12 %   Monocytes Absolute 1.4 (*) 0.1 - 1.0 K/uL   Eosinophils Relative 1  0 - 5 %   Eosinophils Absolute 0.1  0.0 - 0.7 K/uL   Basophils Relative 1  0 - 1 %   Basophils Absolute 0.1  0.0 - 0.1 K/uL  COMPREHENSIVE METABOLIC PANEL     Status: Abnormal   Collection Time    06/22/12  4:00 PM      Result Value Range   Sodium 140  135 - 145 mEq/L   Potassium 4.0  3.5 - 5.1 mEq/L   Chloride 103  96 - 112 mEq/L   CO2 25  19 - 32 mEq/L   Glucose, Bld 107  (*) 70 - 99 mg/dL   BUN 25 (*) 6 - 23 mg/dL   Creatinine, Ser 7.82 (*) 0.50 - 1.10 mg/dL   Calcium 95.6 (*) 8.4 - 10.5 mg/dL   Total Protein 8.4 (*) 6.0 - 8.3 g/dL   Albumin 4.4  3.5 - 5.2 g/dL   AST 54 (*) 0 - 37 U/L   ALT 62 (*) 0 - 35 U/L   Alkaline Phosphatase 127 (*) 39 - 117 U/L   Total Bilirubin 0.3  0.3 -  1.2 mg/dL   GFR calc non Af Amer 33 (*) >90 mL/min   GFR calc Af Amer 38 (*) >90 mL/min   Comment:            The eGFR has been calculated     using the CKD EPI equation.     This calculation has not been     validated in all clinical     situations.     eGFR's persistently     <90 mL/min signify     possible Chronic Kidney Disease.  TROPONIN I     Status: None   Collection Time    06/22/12  4:00 PM      Result Value Range   Troponin I <0.30  <0.30 ng/mL   Comment:            Due to the release kinetics of cTnI,     a negative result within the first hours     of the onset of symptoms does not rule out     myocardial infarction with certainty.     If myocardial infarction is still suspected,     repeat the test at appropriate intervals.  URINALYSIS, ROUTINE W REFLEX MICROSCOPIC     Status: Abnormal   Collection Time    06/22/12  5:03 PM      Result Value Range   Color, Urine YELLOW  YELLOW   APPearance CLOUDY (*) CLEAR   Specific Gravity, Urine 1.019  1.005 - 1.030   pH 5.5  5.0 - 8.0   Glucose, UA NEGATIVE  NEGATIVE mg/dL   Hgb urine dipstick MODERATE (*) NEGATIVE   Bilirubin Urine NEGATIVE  NEGATIVE   Ketones, ur NEGATIVE  NEGATIVE mg/dL   Protein, ur >956 (*) NEGATIVE mg/dL   Urobilinogen, UA 0.2  0.0 - 1.0 mg/dL   Nitrite NEGATIVE  NEGATIVE   Leukocytes, UA SMALL (*) NEGATIVE  URINE MICROSCOPIC-ADD ON     Status: Abnormal   Collection Time    06/22/12  5:03 PM      Result Value Range   Squamous Epithelial / LPF FEW (*) RARE   WBC, UA 11-20  <3 WBC/hpf   RBC / HPF 7-10  <3 RBC/hpf   Bacteria, UA MANY (*) RARE   Casts GRANULAR CAST (*) NEGATIVE    Comment: HYALINE CASTS    Radiological Exams on Admission: Dg Chest 2 View  06/22/2012   *RADIOLOGY REPORT*  Clinical Data: Syncope.  Nonsmoker.  History of asthma.  CHEST - 2 VIEW  Comparison: 08/27/2009.  Findings: Normal sized heart.  Clear lungs.  Mild central peribronchial thickening without significant change.  Unremarkable bones.  IMPRESSION: No acute abnormality.  Stable mild chronic bronchitic changes.   Original Report Authenticated By: Beckie Salts, M.D.   Ct Head Wo Contrast  06/22/2012   *RADIOLOGY REPORT*  Clinical Data: Syncope. 62 year old female.  Nausea vomiting weakness diarrhea.  CT HEAD WITHOUT CONTRAST  Technique:  Contiguous axial images were obtained from the base of the skull through the vertex without contrast.  Comparison: Brain MRI 11/01/2009 and earlier.  Findings: Minimal right posterior ethmoid sinus bubbly opacity. Other Visualized paranasal sinuses and mastoids are clear. Visualized orbits and scalp soft tissues are within normal limits. No acute osseous abnormality identified.  Stable and normal for age cerebral volume.  No ventriculomegaly. No midline shift, mass effect, or evidence of mass lesion.  No evidence of cortically based acute infarction identified.  No acute intracranial hemorrhage identified.  No  suspicious intracranial vascular hyperdensity.  IMPRESSION: Stable and normal for age noncontrast CT appearance of the brain.   Original Report Authenticated By: Erskine Speed, M.D.    Assessment/Plan Active Problems:    1. Syncope: Patient presented following a syncopal episode, and was found to be volume depleted, dehydrated and orthostatic. The clinical scenario appears consistent with a vaso-vagal etiology. Head CT scan is devoid of acute findings, as is CXR, 12-Lead EKG shows no acute ischemic changes, and initial set of cardiac enzymes is negative. Will admit for observation, monitor telemetrically and complete cycling cardiac enzymes. She has no focal  neurologic deficit.  2. Dehydration/AKI (acute kidney injury): As described above, patient was found on initial evaluation in the ED, to be orthostatic, creatinine 1.61, BUN 25. Her baseline creatinine was 0.77 in 02/19/10. Thus her acute kidney injury is likely pre-renal, in the setting of diuretic use, UTI and a brief episode of vomiting and diarrhea, resulting in dehydration. We shall manage with iv fluids, hold diuretics and ARB,and follow renal indices.  3. UTI: Urine sediment revealed pyuria and bacteriuria, consistent with UTI, which may have been contributory to dehydration. She does have a mild leukocytosis of 13.7, although this may be due to hemoconcentration, given HB of 15.7. We shall therefore, treat with iv Rocephin, and await cultures.  4. Acute gastroenteritis: Patient did have a single episode of vomiting and diarrhea, without abdominal pain, and has no history of ingestion of unusual foods or sick contacts. Etiology is unclear. Will manage with bowel rest, iv fluids, PPI and antiemetics. If symptoms persist, will send off stool studies.  5. Unspecified hypothyroidism: Continue thyroxine replacement therapy and check TSH.  6. HTN (hypertension): Patient has postural BP changes at this time, so will hold pre-admission antihypertensives, and observe.  7. Type II diabetes mellitus: This appears well controlled, based on random blood glucose of 113. Will hold Metformin for now, and manage with SSI.  8. Sarcoidosis: This was diagnosed about 2 years ago, per patient, and appears clinically stable at this time. 9. Heart murmur: An incidental finding on physical examination. No clinical evidence of CHF at this time. Will order 2D Echocardiogram to evaluated for valvular heart disease.   Further management will depend on clinical course.  Comment: Patient is FULL CODE.   Time Spent on Admission: 1 hour.   [name],CHRISTOPHER 06/22/2012, 6:17 PM

## 2012-06-23 DIAGNOSIS — I10 Essential (primary) hypertension: Secondary | ICD-10-CM

## 2012-06-23 DIAGNOSIS — I059 Rheumatic mitral valve disease, unspecified: Secondary | ICD-10-CM

## 2012-06-23 DIAGNOSIS — I951 Orthostatic hypotension: Secondary | ICD-10-CM

## 2012-06-23 LAB — TROPONIN I: Troponin I: <0.3 ng/mL (ref <0.30)

## 2012-06-23 LAB — COMPREHENSIVE METABOLIC PANEL
ALT: 42 U/L — ABNORMAL HIGH (ref 0–35)
BUN: 20 mg/dL (ref 6–23)
CO2: 24 mEq/L (ref 19–32)
Calcium: 9 mg/dL (ref 8.4–10.5)
GFR calc Af Amer: 64 mL/min — ABNORMAL LOW (ref >90)
GFR calc non Af Amer: 55 mL/min — ABNORMAL LOW (ref >90)
Glucose, Bld: 106 mg/dL — ABNORMAL HIGH (ref 70–99)
Sodium: 138 mEq/L (ref 135–145)
Total Protein: 6.5 g/dL (ref 6.0–8.3)

## 2012-06-23 LAB — CBC
HCT: 37.8 % (ref 36.0–46.0)
Hemoglobin: 12.9 g/dL (ref 12.0–15.0)
MCHC: 34.1 g/dL (ref 30.0–36.0)
RDW: 12.8 % (ref 11.5–15.5)
WBC: 5.4 10*3/uL (ref 4.0–10.5)

## 2012-06-23 LAB — GLUCOSE, CAPILLARY
Glucose-Capillary: 99 mg/dL (ref 70–99)
Glucose-Capillary: 89 mg/dL (ref 70–99)

## 2012-06-23 MED ORDER — PANTOPRAZOLE SODIUM 40 MG PO TBEC: 40.0000 mg | DELAYED_RELEASE_TABLET | Freq: Every day | ORAL | Status: DC

## 2012-06-23 MED ORDER — LEVOFLOXACIN 500 MG PO TABS: 500.0000 mg | ORAL_TABLET | Freq: Every day | ORAL | Status: DC

## 2012-06-23 MED ORDER — OLMESARTAN MEDOXOMIL 20 MG PO TABS: 20.0000 mg | ORAL_TABLET | Freq: Every day | ORAL | Status: DC

## 2012-06-23 NOTE — Progress Notes (Signed)
This patient is receiving IV Protonix. Based on criteria approved by the Pharmacy and Therapeutics Committee, this medication is being converted to the equivalent oral dose form. These criteria include:   . The patient is eating (either orally or per tube) and/or has been taking other orally administered medications for at least 24 hours.  . This patient has no evidence of active gastrointestinal bleeding or impaired GI absorption (gastrectomy, short bowel, patient on TNA or NPO).   If you have questions about this conversion, please contact the pharmacy department.  Dannielle Huh, [facility] 06/23/2012 11:40 AM

## 2012-06-23 NOTE — Discharge Summary (Addendum)
Physician Discharge Summary  [name] UJW:[id number] DOB: 12-17-1950 DOA: 06/22/2012  PCP: Ralene Ok, MD  Admit date: 06/22/2012 Discharge date: 06/23/2012  Time spent: 40 minutes  Recommendations for Outpatient Follow-up:  1. Follow up with PMD.   Discharge Diagnoses:  Active Problems:   Syncope   Dehydration   AKI (acute kidney injury)   Unspecified hypothyroidism   HTN (hypertension)   Type II or unspecified type diabetes mellitus without mention of complication, uncontrolled   Sarcoidosis   Heart murmur   UTI (urinary tract infection)   Discharge Condition: Satisfactory.   Diet recommendation: Heart-healthy/Carbohydrate-Modified.   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 Patient is an excellent historian, and according to her, she was perfectly fine, when she woke up in the AM of 06/22/12. She went horse riding at about 11:00 AM, rode in the woods with 2 other companions. About 45 minutes into the ride, she felt unwell, so the stopped, she got off her horse, and sat on the ground for a few minutes, then became severely nauseated, vomited once, had a single profuse watery diarrhea. No abdominal pain, fever or chills. She persuaded her companions to return the horses and fetch the car. After they had gone, she waited about 15 minutes, then got up, and started walking, then became very dizzy, and must have "passed out. She does not remember falling, but when she came to, after about 5 minutes, she was on the ground, felt cold and sweaty, and had been incontinent of urine. She had no antecedent chest pain or palpitations. Her companions  came back about 20 minutes later, took her home, she washed up, changed her clothes, and was taken to the [facility], where she was evaluated, and referred to the ED. In the ED, she was found to be orthostatic, with creatinine 1.61, BUN 25. U/A revealed pyuria and many bacteria.   Hospital Course:  1. Syncope: Patient presented following a syncopal episode, and was found to be volume depleted, dehydrated and orthostatic. The clinical scenario appears consistent with a vaso-vagal etiology. Head CT scan was devoid of acute findings, as was CXR, 12-Lead EKG showed no acute ischemic changes, and cardiac enzymes remained unelevated. She had no focal neurologic deficit. Telemetric monitoring revealed no arrhythmias, and orthostasis resolved with ivi fluids. As of AM 06/23/12, patient was asymptomatic.  2. Dehydration/AKI (acute kidney injury): As described above, patient was found on initial evaluation in the ED, to be orthostatic, creatinine 1.61, BUN 25. Her baseline creatinine was 0.77 in 02/19/10. Thus her acute kidney injury was likely pre-renal, in the setting of diuretic use, UTI and a brief episode of vomiting and diarrhea, resulting in dehydration. Managed with iv fluids, diuretics and ARB, were held, and as  Of 06/23/12, creatinine had normalized at 1.05.  3. UTI: Urine sediment revealed pyuria and bacteriuria, consistent with UTI, which may have been contributory to dehydration. She did have a mild leukocytosis of 13.7, although this may be due to hemoconcentration, given HB of 15.7. As of 06/23/12, wcc was 5.4, no pyrexia hd been recorded, and HB was 12.9. Urine cultures have revealed no growth so far. Treated initially with iv rocephin, but  transitioned to oral Levaquin as of 06/23/12, to conclude antibiotics on 06/28/12.  4. Acute gastroenteritis: Patient did have a single episode of vomiting and diarrhea, without abdominal pain, and has no history of ingestion of unusual foods or sick contacts. Etiology is unclear.  Managed with bowel rest, iv fluids, PPI and antiemetics, and no recurrence was noted during hospitalization. Diet was advanced and tolerated. Likely, this was a self-limited acute viral gastroenteritis.  5. Unspecified hypothyroidism: Continued on thyroxine replacement therapy. TSH was normal at 2.083.  6. HTN (hypertension): Patient had postural BP changes on presentation, so re-admission antihypertensives were temporarily held. ARB has been reinstated on discharge, but have discontinued Hydrochlorothiazide.  7. Type II diabetes mellitus: This appears well controlled, based on random blood glucose of 113. She remained euglycemic.  8. Sarcoidosis: This was diagnosed about 2 years ago, per patient, and appears clinically stable at this time.  9. Heart murmur: An incidental finding on physical examination. No clinical evidence of CHF at this time.  2D Echocardiogram revealed moderate LVH, EF of 65% to 70% and no regional wall motion abnormalities. There was mild regurgitation, otherwise, normal valves. Patient has been reassured accordingly.      Procedures:  See Below.  2D Echocardiogram.  Consultations:  N/A.   Discharge Exam: Filed Vitals:   06/23/12 0546 06/23/12 0548 06/23/12 0549 06/23/12 1347  BP: 117/41 129/66 130/60 118/54  Pulse: 61 71 70 68  Temp: 97.7 F (36.5 C)   98.5 F (36.9 C)  TempSrc:    Oral  Resp: 16   18  Height:      Weight:      SpO2: 98%   99%    General: Patient does not appear to be in obvious acute distress. Alert, communicative, fully oriented, talking in complete sentences, not short of breath at rest.  HEENT: No clinical pallor, no jaundice, no conjunctival injection or discharge. Hydration status is satisfactory.  NECK: Supple, JVP not seen, no carotid bruits, no palpable lymphadenopathy, no palpable goiter.  CHEST: Clinically clear to auscultation, no wheezes, no crackles.  HEART: Sounds 1 and 2 heard, normal, regular, soft systolic murmur at the  apex.  ABDOMEN: Full, soft, non-tender, no palpable organomegaly, no palpable masses, normal bowel sounds.  GENITALIA: Not examined.  LOWER EXTREMITIES: No pitting edema, palpable peripheral pulses.  MUSCULOSKELETAL SYSTEM: Generalized osteoarthritic changes, otherwise, normal.  CENTRAL NERVOUS SYSTEM: No focal neurologic deficit on gross examination.  Discharge Instructions      Discharge Orders   Future Orders Complete By Expires     Diet - low sodium heart healthy  As directed     Diet Carb Modified  As directed     Increase activity slowly  As directed         Medication List    STOP taking these medications       olmesartan-hydrochlorothiazide 20-12.5 MG per tablet  Commonly known as:  BENICAR HCT      TAKE these medications       levofloxacin 500 MG tablet  Commonly known as:  LEVAQUIN  Take 1 tablet (500 mg total) by mouth daily.     levothyroxine 150 MCG tablet  Commonly known as:  SYNTHROID, LEVOTHROID  Take 150 mcg by mouth daily before breakfast. Pt alternates between 150 mcg and 175 mcg     levothyroxine 175 MCG tablet  Commonly known as:  SYNTHROID, LEVOTHROID  Take 175 mcg by mouth daily before breakfast. Pt alternates between 175 mcg and 150  mcg     loratadine 10 MG tablet  Commonly known as:  CLARITIN  Take 10 mg by mouth daily.     metFORMIN 500 MG tablet  Commonly known as:  GLUCOPHAGE  Take 500 mg by mouth 2 (two) times daily with a meal.     olmesartan 20 MG tablet  Commonly known as:  BENICAR  Take 1 tablet (20 mg total) by mouth daily.     zolpidem 10 MG tablet  Commonly known as:  AMBIEN  Take 10 mg by mouth at bedtime as needed for sleep.       No Known Allergies Follow-up Information   Schedule an appointment as soon as possible for a visit with Ralene Ok, MD.   Contact information:   [address] [place] [address] [phone]        The results of significant diagnostics from this hospitalization (including  imaging, microbiology, ancillary and laboratory) are listed below for reference.    Significant Diagnostic Studies: Dg Chest 2 View  06/22/2012   *RADIOLOGY REPORT*  Clinical Data: Syncope.  Nonsmoker.  History of asthma.  CHEST - 2 VIEW  Comparison: 08/27/2009.  Findings: Normal sized heart.  Clear lungs.  Mild central peribronchial thickening without significant change.  Unremarkable bones.  IMPRESSION: No acute abnormality.  Stable mild chronic bronchitic changes.   Original Report Authenticated By: Beckie Salts, M.D.   Ct Head Wo Contrast  06/22/2012   *RADIOLOGY REPORT*  Clinical Data: Syncope. 62 year old female.  Nausea vomiting weakness diarrhea.  CT HEAD WITHOUT CONTRAST  Technique:  Contiguous axial images were obtained from the base of the skull through the vertex without contrast.  Comparison: Brain MRI 11/01/2009 and earlier.  Findings: Minimal right posterior ethmoid sinus bubbly opacity. Other Visualized paranasal sinuses and mastoids are clear. Visualized orbits and scalp soft tissues are within normal limits. No acute osseous abnormality identified.  Stable and normal for age cerebral volume.  No ventriculomegaly. No midline shift, mass effect, or evidence of mass lesion.  No evidence of cortically based acute infarction identified.  No acute intracranial hemorrhage identified.  No suspicious intracranial vascular hyperdensity.  IMPRESSION: Stable and normal for age noncontrast CT appearance of the brain.   Original Report Authenticated By: Erskine Speed, M.D.    Microbiology: No results found for this or any previous visit (from the past 240 hour(s)).   Labs: Basic Metabolic Panel:  Recent Labs Lab 06/22/12 1600 06/23/12 0632  NA 140 138  K 4.0 3.7  CL 103 106  CO2 25 24  GLUCOSE 107* 106*  BUN 25* 20  CREATININE 1.61* 1.05  CALCIUM 11.2* 9.0   Liver Function Tests:  Recent Labs Lab 06/22/12 1600 06/23/12 0632  AST 54* 36  ALT 62* 42*  ALKPHOS 127* 91  BILITOT 0.3  0.4  PROT 8.4* 6.5  ALBUMIN 4.4 3.3*   No results found for this basename: LIPASE, AMYLASE,  in the last 168 hours No results found for this basename: AMMONIA,  in the last 168 hours CBC:  Recent Labs Lab 06/22/12 1600 06/23/12 0632  WBC 13.7* 5.4  NEUTROABS 10.4*  --   HGB 15.7* 12.9  HCT 44.6 37.8  MCV 85.3 85.5  PLT 212 148*   Cardiac Enzymes:  Recent Labs Lab 06/22/12 1600 06/22/12 1903 06/23/12 0015 06/23/12 0632  TROPONINI <0.30 <0.30 <0.30 <0.30   BNP: BNP (last 3 results) No results found for this basename: PROBNP,  in the last 8760 hours CBG:  Recent  Labs Lab 06/22/12 1453 06/22/12 2020 06/23/12 0744 06/23/12 1130  GLUCAP 113* 176* 99 89       Signed:  Jammy Plotkin,CHRISTOPHER  [facility] 06/23/2012, 3:05 PM

## 2012-06-23 NOTE — Progress Notes (Signed)
*  PRELIMINARY RESULTS* Echocardiogram 2D Echocardiogram has been performed.  Jeryl Columbia 06/23/2012, 9:14 AM

## 2012-06-23 NOTE — Care Management Note (Addendum)
    Page 1 of 1   06/23/2012     4:28:28 PM   CARE MANAGEMENT NOTE 06/23/2012  Patient:  [name], [name]   Account Number:  [id number]  Date Initiated:  06/23/2012  Documentation initiated by:  [facility]  Subjective/Objective Assessment:   ADMITTED W/[email or web address].HX:HTN     Action/Plan:   FROM HOME.   Anticipated DC Date:  06/23/2012   Anticipated DC Plan:  HOME/SELF CARE      DC Planning Services  CM consult      Choice offered to / List presented to:             Status of service:  Completed, signed off Medicare Important Message given?   (If response is "NO", the following Medicare IM given date fields will be blank) Date Medicare IM given:   Date Additional Medicare IM given:    Discharge Disposition:  HOME/SELF CARE  Per UR Regulation:  Reviewed for med. necessity/level of care/duration of stay  If discussed at Long Length of Stay Meetings, dates discussed:    Comments:  06/23/12 [facility] RN,BSN NCM [phone]

## 2012-06-24 LAB — URINE CULTURE: Colony Count: >=100000

## 2012-06-28 ENCOUNTER — Other Ambulatory Visit (HOSPITAL_COMMUNITY): Payer: Self-pay | Admitting: Internal Medicine

## 2012-06-28 DIAGNOSIS — R0602 Shortness of breath: Secondary | ICD-10-CM

## 2012-07-06 ENCOUNTER — Ambulatory Visit (HOSPITAL_COMMUNITY)
Admission: RE | Admit: 2012-07-06 | Discharge: 2012-07-06 | Disposition: A | Discharge status: Home / Self Care | Payer: BC Managed Care – PPO | Source: Ambulatory Visit | Attending: Internal Medicine | Admitting: Internal Medicine

## 2012-07-06 DIAGNOSIS — R0989 Other specified symptoms and signs involving the circulatory and respiratory systems: Secondary | ICD-10-CM | POA: Insufficient documentation

## 2012-07-06 DIAGNOSIS — R0609 Other forms of dyspnea: Secondary | ICD-10-CM | POA: Insufficient documentation

## 2012-07-06 DIAGNOSIS — R0602 Shortness of breath: Secondary | ICD-10-CM | POA: Insufficient documentation

## 2012-07-06 MED ORDER — ALBUTEROL SULFATE (5 MG/ML) 0.5% IN NEBU
2.5000 mg | INHALATION_SOLUTION | Freq: Once | RESPIRATORY_TRACT | Status: AC
  Administered 2012-07-06: 2.5 mg via RESPIRATORY_TRACT

## 2012-11-23 ENCOUNTER — Other Ambulatory Visit: Payer: Self-pay

## 2013-05-16 ENCOUNTER — Other Ambulatory Visit (HOSPITAL_COMMUNITY): Payer: Self-pay | Admitting: Internal Medicine

## 2013-05-16 ENCOUNTER — Other Ambulatory Visit: Payer: Self-pay | Admitting: Internal Medicine

## 2013-05-16 ENCOUNTER — Ambulatory Visit
Admission: RE | Admit: 2013-05-16 | Discharge: 2013-05-16 | Disposition: A | Discharge status: Home / Self Care | Payer: BC Managed Care – PPO | Source: Ambulatory Visit | Attending: Internal Medicine | Admitting: Internal Medicine

## 2013-05-16 DIAGNOSIS — Z1231 Encounter for screening mammogram for malignant neoplasm of breast: Secondary | ICD-10-CM

## 2013-05-16 DIAGNOSIS — R0602 Shortness of breath: Secondary | ICD-10-CM

## 2013-05-17 ENCOUNTER — Ambulatory Visit (HOSPITAL_COMMUNITY)
Admission: RE | Admit: 2013-05-17 | Discharge: 2013-05-17 | Disposition: A | Discharge status: Home / Self Care | Payer: BC Managed Care – PPO | Source: Ambulatory Visit | Attending: Internal Medicine | Admitting: Internal Medicine

## 2013-05-17 DIAGNOSIS — Z1231 Encounter for screening mammogram for malignant neoplasm of breast: Secondary | ICD-10-CM

## 2013-11-02 ENCOUNTER — Other Ambulatory Visit: Payer: Self-pay

## 2013-12-04 ENCOUNTER — Other Ambulatory Visit: Payer: Self-pay | Admitting: Internal Medicine

## 2013-12-04 DIAGNOSIS — E049 Nontoxic goiter, unspecified: Secondary | ICD-10-CM

## 2013-12-04 DIAGNOSIS — E039 Hypothyroidism, unspecified: Secondary | ICD-10-CM

## 2013-12-10 ENCOUNTER — Other Ambulatory Visit: Payer: BC Managed Care – PPO

## 2013-12-20 ENCOUNTER — Ambulatory Visit
Admission: RE | Admit: 2013-12-20 | Discharge: 2013-12-20 | Disposition: A | Discharge status: Home / Self Care | Payer: BC Managed Care – PPO | Source: Ambulatory Visit | Attending: Internal Medicine | Admitting: Internal Medicine

## 2013-12-20 DIAGNOSIS — E039 Hypothyroidism, unspecified: Secondary | ICD-10-CM

## 2013-12-20 DIAGNOSIS — E049 Nontoxic goiter, unspecified: Secondary | ICD-10-CM

## 2014-07-15 ENCOUNTER — Other Ambulatory Visit: Payer: Self-pay

## 2015-01-16 ENCOUNTER — Encounter: Payer: Self-pay | Admitting: Sports Medicine

## 2015-01-16 ENCOUNTER — Ambulatory Visit (INDEPENDENT_AMBULATORY_CARE_PROVIDER_SITE_OTHER): Payer: BLUE CROSS/BLUE SHIELD | Admitting: Sports Medicine

## 2015-01-16 DIAGNOSIS — E119 Type 2 diabetes mellitus without complications: Secondary | ICD-10-CM

## 2015-01-16 DIAGNOSIS — L603 Nail dystrophy: Secondary | ICD-10-CM

## 2015-01-16 DIAGNOSIS — M79674 Pain in right toe(s): Secondary | ICD-10-CM | POA: Diagnosis not present

## 2015-01-16 NOTE — Progress Notes (Deleted)
   Subjective:    Patient ID: [name] RuskMary L [name], female    DOB: 1950-11-14, 64 y.o.   MRN: [id number]  HPI    Review of Systems  All other systems reviewed and are negative.      Objective:   Physical Exam        Assessment & Plan:

## 2015-01-16 NOTE — Patient Instructions (Signed)

## 2015-01-16 NOTE — Progress Notes (Signed)
Patient ID: [name] RuskMary L [name], female   DOB: 10/02/1950, 64 y.o.   MRN: [id number] Subjective: [name] RuskMary L [name] is a 64 y.o.  Diabetic female patient presents to office today complaining of a painful, thick, and deformed right great toenail. This has been present for 2 years after horse stepped on foot. Patient has treated this by filing/trimming with no improvement. Patient denies fever/chills/nausea/vomitting/any other related constitutional symptoms at this time.  FBS 108 this am  Review of Systems  All other systems reviewed and are negative.  Patient Active Problem List   Diagnosis Date Noted  . Syncope 06/22/2012  . Dehydration 06/22/2012  . AKI (acute kidney injury) (HCC) 06/22/2012  . Unspecified hypothyroidism 06/22/2012  . HTN (hypertension) 06/22/2012  . Type II or unspecified type diabetes mellitus without mention of complication, uncontrolled 06/22/2012  . Sarcoidosis (HCC) 06/22/2012  . Heart murmur 06/22/2012  . UTI (urinary tract infection) 06/22/2012   Current Outpatient Prescriptions on File Prior to Visit  Medication Sig Dispense Refill  . levothyroxine (SYNTHROID, LEVOTHROID) 150 MCG tablet Take 150 mcg by mouth daily before breakfast. Pt alternates between 150 mcg and 175 mcg    . loratadine (CLARITIN) 10 MG tablet Take 10 mg by mouth daily.    Marland Kitchen. zolpidem (AMBIEN) 10 MG tablet Take 10 mg by mouth at bedtime as needed for sleep.     No current facility-administered medications on file prior to visit.   Allergies  Allergen Reactions  . Sulfa Antibiotics     High fever     Objective:    General: Well developed, nourished, in no acute distress, alert and oriented x3   Dermatology: Skin is warm, dry and supple bilateral. Right hallux nail appears to be severely thickened with ram horn appearance with distal lifting. (-) Erythema. (-) Edema. (-) serosanguous  drainage present. The remaining nails appear unremarkable at this time. There are no open sores, lesions or other  signs of infection present.  Vascular: Dorsalis Pedis artery and Posterior Tibial artery pedal pulses are 2/4 bilateral with immedate capillary fill time. Pedal hair growth present. No lower extremity edema.   Neruologic: Grossly intact via light touch bilateral. Epicritic and protective and vibratory intact bilateral.   Musculoskeletal: Tenderness to palpation of the Right hallux nail. Muscular strength within normal limits in all groups bilateral.   Assesement and Plan: Problem List Items Addressed This Visit    None    Visit Diagnoses    Nail dystrophy    -  Primary    onychogryphosis    Pain in toe of right foot        Diabetes mellitus without complication (HCC)        Relevant Medications    BENICAR HCT 40-25 MG tablet    metFORMIN (GLUCOPHAGE-XR) 500 MG 24 hr tablet      -Discussed treatment alternatives and plan of care; Explained permanent/temporary nail avulsion and post procedure course to patient. Patient opt for temp removal of right hallux nail.  Made patient aware that as new nail grows in it will be hard to determine how the nail will form and that may still grow back abnormal - After a verbal consent, injected 3 ml of a 50:50 mixture of 2% plain  lidocaine and 0.5% plain marcaine in a normal hallux block fashion. Next, a  betadine prep was performed. Anesthesia was tested and found to be appropriate.  The right hallux nail  was then incised from the hyponychium to the epinychium and removed  the nail in total, cleansed with peroxide and dressed with triple antibiotic cream and a dry sterile dressing. -Patient was instructed to leave the dressing intact for today and begin soaking  in a weak solution of betadine or epsom salt and water tomorrow. Patient was instructed to  soak for 15 minutes each day and apply neosporin and a gauze or bandaid dressing each day. -Patient was instructed to monitor the toe for signs of infection and return to office if toe becomes red, hot  or swollen. -Patient is to return in 1 week for follow up care or sooner if problems arise.  Asencion Islam, DPM

## 2015-01-22 ENCOUNTER — Ambulatory Visit: Payer: BLUE CROSS/BLUE SHIELD | Admitting: Sports Medicine

## 2015-01-29 ENCOUNTER — Ambulatory Visit: Payer: BLUE CROSS/BLUE SHIELD | Admitting: Sports Medicine

## 2015-06-07 ENCOUNTER — Encounter (HOSPITAL_COMMUNITY): Payer: Self-pay | Admitting: Oncology

## 2015-06-07 ENCOUNTER — Emergency Department (HOSPITAL_COMMUNITY)
Admission: EM | Admit: 2015-06-07 | Discharge: 2015-06-07 | Disposition: A | Discharge status: Home / Self Care | Payer: Medicare Other | Attending: Emergency Medicine | Admitting: Emergency Medicine

## 2015-06-07 ENCOUNTER — Emergency Department (HOSPITAL_COMMUNITY): Payer: Medicare Other

## 2015-06-07 DIAGNOSIS — R109 Unspecified abdominal pain: Secondary | ICD-10-CM | POA: Insufficient documentation

## 2015-06-07 DIAGNOSIS — Z7984 Long term (current) use of oral hypoglycemic drugs: Secondary | ICD-10-CM | POA: Diagnosis not present

## 2015-06-07 DIAGNOSIS — E119 Type 2 diabetes mellitus without complications: Secondary | ICD-10-CM | POA: Insufficient documentation

## 2015-06-07 DIAGNOSIS — Y999 Unspecified external cause status: Secondary | ICD-10-CM | POA: Insufficient documentation

## 2015-06-07 DIAGNOSIS — Y939 Activity, unspecified: Secondary | ICD-10-CM | POA: Insufficient documentation

## 2015-06-07 DIAGNOSIS — W1789XA Other fall from one level to another, initial encounter: Secondary | ICD-10-CM | POA: Insufficient documentation

## 2015-06-07 DIAGNOSIS — S20212A Contusion of left front wall of thorax, initial encounter: Secondary | ICD-10-CM

## 2015-06-07 DIAGNOSIS — Y929 Unspecified place or not applicable: Secondary | ICD-10-CM | POA: Insufficient documentation

## 2015-06-07 DIAGNOSIS — M545 Low back pain: Secondary | ICD-10-CM | POA: Diagnosis present

## 2015-06-07 DIAGNOSIS — E039 Hypothyroidism, unspecified: Secondary | ICD-10-CM | POA: Insufficient documentation

## 2015-06-07 DIAGNOSIS — Z79899 Other long term (current) drug therapy: Secondary | ICD-10-CM | POA: Diagnosis not present

## 2015-06-07 DIAGNOSIS — I10 Essential (primary) hypertension: Secondary | ICD-10-CM | POA: Diagnosis not present

## 2015-06-07 DIAGNOSIS — W19XXXA Unspecified fall, initial encounter: Secondary | ICD-10-CM

## 2015-06-07 LAB — URINALYSIS, ROUTINE W REFLEX MICROSCOPIC
Bilirubin Urine: NEGATIVE
GLUCOSE, UA: NEGATIVE mg/dL
HGB URINE DIPSTICK: NEGATIVE
Ketones, ur: NEGATIVE mg/dL
LEUKOCYTES UA: NEGATIVE
Nitrite: NEGATIVE
PROTEIN: NEGATIVE mg/dL
SPECIFIC GRAVITY, URINE: 1.01 (ref 1.005–1.030)
pH: 5.5 (ref 5.0–8.0)

## 2015-06-07 LAB — I-STAT CHEM 8, ED
BUN: 10 mg/dL (ref 6–20)
CHLORIDE: 100 mmol/L — AB (ref 101–111)
CREATININE: 0.7 mg/dL (ref 0.44–1.00)
Calcium, Ion: 1.17 mmol/L (ref 1.13–1.30)
GLUCOSE: 127 mg/dL — AB (ref 65–99)
HEMATOCRIT: 43 % (ref 36.0–46.0)
HEMOGLOBIN: 14.6 g/dL (ref 12.0–15.0)
POTASSIUM: 3.5 mmol/L (ref 3.5–5.1)
Sodium: 140 mmol/L (ref 135–145)
TCO2: 25 mmol/L (ref 0–100)

## 2015-06-07 NOTE — ED Provider Notes (Signed)
CSN: [id number]     Arrival date & time 06/07/15  0636 History   First MD Initiated Contact with Patient 06/07/15 0710     Chief Complaint  Patient presents with  . Back Pain     (Consider location/radiation/quality/duration/timing/severity/associated sxs/prior Treatment) HPI [name] is a 65 y.o. female with PMH significant for DM, HTN, sarcoidosis who presents with traumatic, sudden onset, constant, unchanging low back pain/left flank pain x 1 week after sustaining a fall off of her horse trailer, about 3 feet.  She has been taking 800 mg ibuprofen and robaxin with some relief.  Worse with coughing or deep inspiration. Patient reports it was raining and she slipped on wet metal causing her to lose her balance and fall backwards out of the trailer landing flat on her back.  No head injury or LOC.  Denies fever, chills, visual disturbance, HA, neck pain, b/b incontinence, hematuria, dysuria, numbness, weakness, or saddle anesthesia.  No IVDU.  Denies chronic steroid use.  No hx of OP.   Past Medical History  Diagnosis Date  . Diabetes mellitus without complication (HCC)   . Hypertension   . Sarcoidosis (HCC)   . Hypothyroidism   . Sarcoidosis of lung (HCC)   . Sarcoidosis of lymph nodes (HCC)   . 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 fatty liver  . Complication of anesthesia     trouble waking up once   Past Surgical History  Procedure Laterality Date  . Abdominal surgery    . Abdominal hysterectomy    . Cholecystectomy     Family History  Problem Relation Age of Onset  . Heart failure Father   . Hypertension Father    Social History  Substance Use Topics  . Smoking status: Never Smoker   . Smokeless tobacco: Never Used  . Alcohol Use: No   OB History    No data available     Review of Systems All other systems negative unless otherwise stated in HPI    Allergies  Sulfa antibiotics  Home Medications   Prior to Admission medications   Medication Sig Start  Date End Date Taking? Authorizing Provider  BENICAR HCT 40-25 MG tablet  11/19/14   Historical Provider, MD  cefdinir (OMNICEF) 300 MG capsule  11/08/14   Historical Provider, MD  CIPRODEX otic suspension  11/08/14   Historical Provider, MD  furosemide (LASIX) 20 MG tablet  11/10/14   Historical Provider, MD  levothyroxine (SYNTHROID, LEVOTHROID) 150 MCG tablet Take 150 mcg by mouth daily before breakfast. Pt alternates between 150 mcg and 175 mcg    Historical Provider, MD  loratadine (CLARITIN) 10 MG tablet Take 10 mg by mouth daily.    Historical Provider, MD  metFORMIN (GLUCOPHAGE-XR) 500 MG 24 hr tablet  12/21/14   Historical Provider, MD  ONETOUCH VERIO test strip  12/21/14   Historical Provider, MD  zolpidem (AMBIEN) 10 MG tablet Take 10 mg by mouth at bedtime as needed for sleep.    Historical Provider, MD   BP 186/79 mmHg  Pulse 80  Temp(Src) 97.8 F (36.6 C) (Oral)  Resp 20  SpO2 98% Physical Exam  Constitutional: She is oriented to person, place, and time. She appears well-developed and well-nourished.  Non-toxic appearance. She does not have a sickly appearance. She does not appear ill.  HENT:  Head: Normocephalic and atraumatic.  Mouth/Throat: Oropharynx is clear and moist.  Eyes: Conjunctivae are normal.  Neck: Normal range of motion.  Neck supple.  No cervical midline tenderness.  No step offs or crepitus.   Cardiovascular: Normal rate, regular rhythm, normal heart sounds and intact distal pulses.   Pulses:      Dorsalis pedis pulses are 2+ on the right side, and 2+ on the left side.  Pulmonary/Chest: Effort normal and breath sounds normal. No accessory muscle usage or stridor. No respiratory distress. She has no wheezes. She has no rhonchi. She has no rales. She exhibits tenderness.  Abdominal: Soft. Bowel sounds are normal. She exhibits no distension. There is no tenderness.  No Grey Turner sign.   Musculoskeletal: Normal range of motion. She exhibits tenderness.  Mild  thoracic midline tenderness.  No lumbar midline tenderness.  No step offs or crepitus. TTP along left lower ribs. No flail chest.  No crepitus.   Lymphadenopathy:    She has no cervical adenopathy.  Neurological: She is alert and oriented to person, place, and time.  Speech clear without dysarthria. Strength and sensation intact bilaterally throughout lower extremities. No saddle anesthesia. Gait normal.   Skin: Skin is warm and dry.  No erythema, warmth, bruising, or swelling.   Psychiatric: She has a normal mood and affect. Her behavior is normal.    ED Course  Procedures (including critical care time) Labs Review Labs Reviewed  I-STAT CHEM 8, ED - Abnormal; Notable for the following:    Chloride 100 (*)    Glucose, Bld 127 (*)    All other components within normal limits  URINALYSIS, ROUTINE W REFLEX MICROSCOPIC (NOT AT [facility])    Imaging Review Dg Ribs Unilateral W/chest Left  06/07/2015  CLINICAL DATA:  Fall off a trailer 1 week ago, left posterior rib pain EXAM: LEFT RIBS AND CHEST - 3+ VIEW COMPARISON:  Chest radiographs dated 05/16/2013 FINDINGS: Lungs are clear.  No pleural effusion or pneumothorax. The heart is top-normal in size. No displaced left rib fracture is seen. Cholecystectomy clips. IMPRESSION: No evidence of acute cardiopulmonary disease. No displaced left rib fracture is seen. Electronically Signed   By: Charline Bills M.D.   On: 06/07/2015 08:25   Dg Thoracic Spine 2 View  06/07/2015  CLINICAL DATA:  Fall off a trailer one week ago, pain EXAM: THORACIC SPINE 2 VIEWS COMPARISON:  None. FINDINGS: Normal thoracic kyphosis. No evidence of fracture or dislocation. Vertebral body heights and intervertebral disc spaces are maintained. Visualized lungs are essentially clear. IMPRESSION: Negative. Electronically Signed   By: Charline Bills M.D.   On: 06/07/2015 08:22   Dg Lumbar Spine Complete  06/07/2015  CLINICAL DATA:  Fall off trailer 1 week ago, pain EXAM:  LUMBAR SPINE - COMPLETE 4+ VIEW COMPARISON:  CT abdomen pelvis dated 10/18/2008 FINDINGS: Five lumbar-type vertebral bodies. Normal lumbar lordosis. No evidence of fracture or dislocation. Vertebral body heights and intervertebral disc spaces are maintained. Mild degenerative changes at L1-2. Visualized bony pelvis appears intact. Cholecystectomy clips. IMPRESSION: No fracture or dislocation is seen. Electronically Signed   By: Charline Bills M.D.   On: 06/07/2015 08:23   I have personally reviewed and evaluated these images and lab results as part of my medical decision-making.   EKG Interpretation None      MDM   Final diagnoses:  Fall, initial encounter  Flank pain  Rib contusion, left, initial encounter   Patient presents with traumatic back pain and left flank pain after fall from approximately 3 feet landing flat on her back.  No LOC, head injury, neck pain.  VSS, NAD.  On exam, mild midline thoracic tenderness.  Left lower ribs TTP.  No flail chest.  No Grey Turner sign.  No other bruising.  Skin intact.  Normal neuro exam.  Heart RRR, lungs CTAB, abdomen soft and benign.  Concern for rib fracture versus contusion, renal contusion, vertebral compression fracture.  Will obtain plain films of the thoracic, lumbar, and left ribs as well as UA and i-stat chem 8 to assess renal function. Plain films negative for fracture or dislocation.  UA negative. Normal renal function.  Patient advised to continue ibuprofen and robaxin.  Follow up PCP in 1 week.  Discussed return precautions.  Patient agrees and acknowledges the above plan for discharge.      Cheri Fowler, PA-C 06/07/15 0935  Lorre Nick, MD 06/07/15 1540

## 2015-06-07 NOTE — ED Notes (Signed)
Patient ambulated to radiology, will obtain blood upon her return

## 2015-06-07 NOTE — ED Notes (Signed)
Advised pt to use deep breathing technique, along with cough.

## 2015-06-07 NOTE — ED Notes (Signed)
Pt fell out of her horse trailer one week ago, has been managing the pain w/ Ibuprofen and robaxin.  Last night she sneezed and states she felt a "pop" in the mid left of her back.  Rates pain 5/10, aching in nature w/ intermittent sharp spasms.

## 2015-06-07 NOTE — Discharge Instructions (Signed)
Your xrays today do not show any fracture or dislocations.  Your urinalysis is normal.  Kidney function normal.  Continue taking ibuprofen and robaxin.  Follow up with your primary care doctor in 1 week.   Rib Contusion   A rib contusion is a deep bruise on your rib area. Contusions are the result of a blunt trauma that causes bleeding and injury to the tissues under the skin. A rib contusion may involve bruising of the ribs and of the skin and muscles in the area. The skin overlying the contusion may turn blue, purple, or yellow. Minor injuries will give you a painless contusion, but more severe contusions may stay painful and swollen for a few weeks.  CAUSES  A contusion is usually caused by a blow, trauma, or direct force to an area of the body. This often occurs while playing contact sports.  SYMPTOMS  Swelling and redness of the injured area.  Discoloration of the injured area.  Tenderness and soreness of the injured area.  Pain with or without movement. DIAGNOSIS  The diagnosis can be made by taking a medical history and performing a physical exam. An X-ray, CT scan, or MRI may be needed to determine if there were any associated injuries, such as broken bones (fractures) or internal injuries.  TREATMENT  Often, the best treatment for a rib contusion is rest. Icing or applying cold compresses to the injured area may help reduce swelling and inflammation. Deep breathing exercises may be recommended to reduce the risk of partial lung collapse and pneumonia. Over-the-counter or prescription medicines may also be recommended for pain control.  HOME CARE INSTRUCTIONS  Apply ice to the injured area:  Put ice in a plastic bag.  Place a towel between your skin and the bag.  Leave the ice on for 20 minutes, 2-3 times per day. Take medicines only as directed by your health care provider.  Rest the injured area. Avoid strenuous activity and any activities or movements that cause pain. Be careful  during activities and avoid bumping the injured area.  Perform deep-breathing exercises as directed by your health care provider.  Do not lift anything that is heavier than 5 lb (2.3 kg) until your health care provider approves.  Do not use any tobacco products, including cigarettes, chewing tobacco, or electronic cigarettes. If you need help quitting, ask your health care provider. SEEK MEDICAL CARE IF:  You have increased bruising or swelling.  You have pain that is not controlled with treatment.  You have a fever. SEEK IMMEDIATE MEDICAL CARE IF:  You have difficulty breathing or shortness of breath.  You develop a continual cough, or you cough up thick or bloody sputum.  You feel sick to your stomach (nauseous), you throw up (vomit), or you have abdominal pain. This information is not intended to replace advice given to you by your health care provider. Make sure you discuss any questions you have with your health care provider.  Document Released: 09/29/2000 Document Revised: 01/25/2014 Document Reviewed: 10/16/2013  Elsevier Interactive Patient Education [employer]2016 [employer] Inc.

## 2016-08-09 ENCOUNTER — Ambulatory Visit
Admission: RE | Admit: 2016-08-09 | Discharge: 2016-08-09 | Disposition: A | Discharge status: Home / Self Care | Payer: Medicare Other | Source: Ambulatory Visit | Attending: Internal Medicine | Admitting: Internal Medicine

## 2016-08-09 ENCOUNTER — Other Ambulatory Visit: Payer: Self-pay | Admitting: Internal Medicine

## 2016-08-09 DIAGNOSIS — R52 Pain, unspecified: Secondary | ICD-10-CM

## 2017-08-31 ENCOUNTER — Encounter (HOSPITAL_COMMUNITY): Payer: Self-pay

## 2017-08-31 ENCOUNTER — Emergency Department (HOSPITAL_COMMUNITY)
Admission: EM | Admit: 2017-08-31 | Discharge: 2017-09-01 | Disposition: A | Discharge status: Home / Self Care | Payer: Medicare Other | Attending: Emergency Medicine | Admitting: Emergency Medicine

## 2017-08-31 ENCOUNTER — Other Ambulatory Visit: Payer: Self-pay

## 2017-08-31 DIAGNOSIS — I10 Essential (primary) hypertension: Secondary | ICD-10-CM | POA: Insufficient documentation

## 2017-08-31 DIAGNOSIS — E039 Hypothyroidism, unspecified: Secondary | ICD-10-CM | POA: Insufficient documentation

## 2017-08-31 DIAGNOSIS — R112 Nausea with vomiting, unspecified: Secondary | ICD-10-CM | POA: Diagnosis not present

## 2017-08-31 DIAGNOSIS — R51 Headache: Secondary | ICD-10-CM | POA: Insufficient documentation

## 2017-08-31 DIAGNOSIS — R519 Headache, unspecified: Secondary | ICD-10-CM

## 2017-08-31 DIAGNOSIS — R509 Fever, unspecified: Secondary | ICD-10-CM | POA: Diagnosis not present

## 2017-08-31 DIAGNOSIS — E119 Type 2 diabetes mellitus without complications: Secondary | ICD-10-CM | POA: Insufficient documentation

## 2017-08-31 DIAGNOSIS — M542 Cervicalgia: Secondary | ICD-10-CM

## 2017-08-31 DIAGNOSIS — Z7984 Long term (current) use of oral hypoglycemic drugs: Secondary | ICD-10-CM | POA: Insufficient documentation

## 2017-08-31 LAB — CBC WITH DIFFERENTIAL/PLATELET
BASOS PCT: 1 %
Basophils Absolute: 0.1 10*3/uL (ref 0.0–0.1)
Eosinophils Absolute: 0.2 10*3/uL (ref 0.0–0.7)
Eosinophils Relative: 3 %
HEMATOCRIT: 37.3 % (ref 36.0–46.0)
Hemoglobin: 12.8 g/dL (ref 12.0–15.0)
LYMPHS ABS: 1.3 10*3/uL (ref 0.7–4.0)
Lymphocytes Relative: 22 %
MCH: 29.9 pg (ref 26.0–34.0)
MCHC: 34.3 g/dL (ref 30.0–36.0)
MCV: 87.1 fL (ref 78.0–100.0)
MONO ABS: 0.3 10*3/uL (ref 0.1–1.0)
MONOS PCT: 6 %
Neutro Abs: 4.1 10*3/uL (ref 1.7–7.7)
Neutrophils Relative %: 68 %
Platelets: 114 10*3/uL — ABNORMAL LOW (ref 150–400)
RBC: 4.28 MIL/uL (ref 3.87–5.11)
RDW: 12.9 % (ref 11.5–15.5)
WBC: 5.9 10*3/uL (ref 4.0–10.5)

## 2017-08-31 LAB — I-STAT CG4 LACTIC ACID, ED: Lactic Acid, Venous: 1.45 mmol/L (ref 0.5–1.9)

## 2017-08-31 MED ORDER — METOCLOPRAMIDE HCL 5 MG/ML IJ SOLN: 5.0000 mg | Freq: Once | INTRAMUSCULAR | Status: DC

## 2017-08-31 MED ORDER — DIPHENHYDRAMINE HCL 50 MG/ML IJ SOLN: 12.5000 mg | Freq: Once | INTRAMUSCULAR | Status: DC

## 2017-08-31 MED ORDER — MORPHINE SULFATE (PF) 4 MG/ML IV SOLN
4.0000 mg | Freq: Once | INTRAVENOUS | Status: DC
  Filled 2017-08-31: qty 1

## 2017-08-31 MED ORDER — MORPHINE SULFATE (PF) 4 MG/ML IV SOLN
4.0000 mg | Freq: Once | INTRAVENOUS | Status: AC
  Administered 2017-08-31: 4 mg via INTRAVENOUS

## 2017-08-31 MED ORDER — ONDANSETRON HCL 4 MG/2ML IJ SOLN
4.0000 mg | Freq: Once | INTRAMUSCULAR | Status: DC
  Administered 2017-08-31: 4 mg via INTRAVENOUS
  Filled 2017-08-31: qty 2

## 2017-08-31 NOTE — ED Notes (Signed)
Pt is alert and oriented x 4 and is verbally responsive. Pt reports stiff neck and HA that started today at 5pm. Pt reports that symptoms were accompanied by fever of 101 and associated nausea. Pt reports that she took robaxin and Ibuprofen for pain and was effective in managing her fever, but still reports a 10/10 throbbing HA.

## 2017-08-31 NOTE — ED Provider Notes (Signed)
[facility]-EMERGENCY DEPT Provider Note   CSN: [id number] Arrival date & time: 08/31/17  2041     History   Chief Complaint No chief complaint on file.   HPI [name] is a 67 y.o. female.  HPI 67 year old female past medical history significant for hypothyroidism, hypertension, diabetes, sarcoidosis presents to the ED for evaluation of severe headache and neck pain.  Reports that approximately 5 PM this afternoon patient had a severe pain in her neck that radiated to her head.  She has had a constant 10/10 sharp throbbing headache since then.  Reports vomiting but denies any vision changes.  Denies any movement of her neck causes pain.  Patient reports that she took her temperature at that time it was 101.  Patient took 800 mg ibuprofen without relief in her symptoms.  Denies history of headaches or migraines.  States this is a very severe headache for her.  Denies any photophobia, phonophobia.  Does report nausea but denies any vomiting.  She did take her blood pressure medicine earlier today.  Denies any chest pain, shortness of breath, lightheadedness, dizziness, abdominal pain.  Denies any change in her bowel habits.  Pt denies any vision changes, lightheadedness, dizziness, congestion,  cp, sob, cough, abd pain, n/v/d, urinary symptoms, change in bowel habits, melena, hematochezia, lower extremity paresthesias.  Past Medical History:  Diagnosis Date  . Complication of anesthesia    trouble waking up once  . Diabetes mellitus without complication (HCC)   . Hypertension   . Hypothyroidism   . Liver disease    hx tumor in liver, ? fatty liver  . Sarcoidosis   . Sarcoidosis of lung (HCC)   . Sarcoidosis of lymph nodes     Patient Active Problem List   Diagnosis Date Noted  . Syncope 06/22/2012  . Dehydration 06/22/2012  . AKI (acute kidney injury) (HCC) 06/22/2012  . Unspecified hypothyroidism 06/22/2012  . HTN (hypertension) 06/22/2012  . Type II  or unspecified type diabetes mellitus without mention of complication, uncontrolled 06/22/2012  . Sarcoidosis 06/22/2012  . Heart murmur 06/22/2012  . UTI (urinary tract infection) 06/22/2012    Past Surgical History:  Procedure Laterality Date  . ABDOMINAL HYSTERECTOMY    . ABDOMINAL SURGERY    . CHOLECYSTECTOMY       OB History   None      Home Medications    Prior to Admission medications   Medication Sig Start Date End Date Taking? Authorizing Provider  BENICAR HCT 40-25 MG tablet  11/19/14   [provider]  cefdinir (OMNICEF) 300 MG capsule  11/08/14   [provider]  CIPRODEX otic suspension  11/08/14   [provider]  furosemide (LASIX) 20 MG tablet  11/10/14   [provider]  levothyroxine (SYNTHROID, LEVOTHROID) 150 MCG tablet Take 150 mcg by mouth daily before breakfast. Pt alternates between 150 mcg and 175 mcg    [provider]  loratadine (CLARITIN) 10 MG tablet Take 10 mg by mouth daily.    [provider]  metFORMIN (GLUCOPHAGE-XR) 500 MG 24 hr tablet  12/21/14   [provider]  zolpidem (AMBIEN) 10 MG tablet Take 10 mg by mouth at bedtime as needed for sleep.    [provider]    Family History Family History  Problem Relation Age of Onset  . Heart failure Father   . Hypertension Father     Social History Social History   Tobacco Use  .  Smoking status: Never Smoker  . Smokeless tobacco: Never Used  Substance Use Topics  . Alcohol use: No  . Drug use: No     Allergies   Sulfa antibiotics   Review of Systems Review of Systems  All other systems reviewed and are negative.    Physical Exam Updated Vital Signs BP (!) 195/74   Pulse 80   Temp 98.6 F (37 C) (Oral)   Resp 20   Ht 5\' 2"  (1.575 m)   Wt 79.8 kg   SpO2 98%   BMI 32.19 kg/m   Physical Exam  Constitutional: She is oriented to person, place, and time. She appears well-developed and well-nourished.   Non-toxic appearance. No distress.  Patient appears very uncomfortable secondary to pain.  Sitting in the dark room.  HENT:  Head: Normocephalic and atraumatic.  Nose: Nose normal.  Mouth/Throat: Oropharynx is clear and moist.  Eyes: Pupils are equal, round, and reactive to light. Conjunctivae and EOM are normal. Right eye exhibits no discharge. Left eye exhibits no discharge.  Neck:  Limited range of motion secondary to pain.  Cardiovascular: Normal rate, regular rhythm, normal heart sounds and intact distal pulses. Exam reveals no gallop and no friction rub.  No murmur heard. Pulmonary/Chest: Effort normal and breath sounds normal. No stridor. No respiratory distress. She has no wheezes. She has no rales. She exhibits no tenderness.  Abdominal: Soft. Normal appearance and bowel sounds are normal. There is no tenderness. There is no rigidity, no rebound, no guarding, no CVA tenderness, no tenderness at McBurney's point and negative Murphy's sign.  Musculoskeletal: Normal range of motion. She exhibits no tenderness.  Lymphadenopathy:    She has no cervical adenopathy.  Neurological: She is alert and oriented to person, place, and time.  The patient is alert, attentive, and oriented x 3. Speech is clear. Cranial nerve II-VII grossly intact. Negative pronator drift. Sensation intact. Strength 5/5 in all extremities. Reflexes 2+ and symmetric at biceps, triceps, knees, and ankles. Rapid alternating movement and fine finger movements intact.    Skin: Skin is warm and dry. Capillary refill takes less than 2 seconds.  Psychiatric: Her behavior is normal. Judgment and thought content normal.  Nursing note and vitals reviewed.    ED Treatments / Results  Labs (all labs ordered are listed, but only abnormal results are displayed) Labs Reviewed  COMPREHENSIVE METABOLIC PANEL - Abnormal; Notable for the following components:      Result Value   Glucose, Bld 160 (*)    AST 50 (*)    GFR calc  non Af Amer 59 (*)    All other components within normal limits  CBC WITH DIFFERENTIAL/PLATELET - Abnormal; Notable for the following components:   Platelets 114 (*)    All other components within normal limits  CSF CELL COUNT WITH DIFFERENTIAL - Abnormal; Notable for the following components:   Color, CSF RED (*)    Appearance, CSF TURBID (*)    Segmented Neutrophils-CSF 53 (*)    Lymphs, CSF 38 (*)    Monocyte-Macrophage-Spinal Fluid 8 (*)    All other components within normal limits  CSF CELL COUNT WITH DIFFERENTIAL - Abnormal; Notable for the following components:   Color, CSF PINK (*)    Appearance, CSF HAZY (*)    RBC Count, CSF 2,980 (*)    All other components within normal limits  PROTEIN, CSF - Abnormal; Notable for the following components:   Total  Protein, CSF 249 (*)  All other components within normal limits  CULTURE, BLOOD (ROUTINE X 2)  CSF CULTURE  GLUCOSE, CSF  I-STAT CG4 LACTIC ACID, ED    EKG None  Radiology Ct Angio Head W/cm &/or Wo Cm  Result Date: 09/01/2017 CLINICAL DATA:  Fever, headache and neck stiffness with nausea since August 31, 2017 at 1700 hours. History of sarcoidosis, hypertension, diabetes. EXAM: CT ANGIOGRAPHY HEAD AND NECK TECHNIQUE: Multidetector CT imaging of the head and neck was performed using the standard protocol during bolus administration of intravenous contrast. Multiplanar CT image reconstructions and MIPs were obtained to evaluate the vascular anatomy. Carotid stenosis measurements (when applicable) are obtained utilizing NASCET criteria, using the distal internal carotid diameter as the denominator. CONTRAST:  ISOVUE-370 IOPAMIDOL (ISOVUE-370) INJECTION 76% COMPARISON:  CT HEAD June 22, 2012 and MRI/MRA head November 01, 2009 and thyroid ultrasound December 20, 2013. FINDINGS: CT HEAD FINDINGS BRAIN: No intraparenchymal hemorrhage, mass effect nor midline shift. The ventricles and sulci are normal for age. Patchy supratentorial  white matter hypodensities within normal range for patient's age, though non-specific are most compatible with chronic small vessel ischemic disease. No acute large vascular territory infarcts. No abnormal extra-axial fluid collections. Basal cisterns are patent. 6 mm pineal cyst. VASCULAR: Moderate calcific atherosclerosis of the carotid siphons. SKULL: No skull fracture. No significant scalp soft tissue swelling. SINUSES/ORBITS: Trace paranasal sinus mucosal thickening. Mastoid air cells are well aerated.The included ocular globes and orbital contents are non-suspicious. OTHER: None. CTA NECK FINDINGS: AORTIC ARCH: Normal appearance of the thoracic arch, 2 vessel arch is a normal variant. Mild intimal thickening calcific atherosclerosis. The origins of the innominate, left Common carotid artery and subclavian artery are widely patent. RIGHT CAROTID SYSTEM: Common carotid artery is patent. Atherosclerosis resulting in less than 50% stenosis RIGHT ICA by NASCET criteria. Patent internal carotid artery. LEFT CAROTID SYSTEM: Common carotid artery is patent. Mild calcific atherosclerosis of the carotid bifurcation without hemodynamically significant stenosis by NASCET criteria. Normal appearance of the internal carotid artery. VERTEBRAL ARTERIES:Codominant vertebral arteries. Patent vertebral arteries with extrinsic compression due to degenerative cervical spine. No suspicious luminal irregularity or flow-limiting stenosis. SKELETON: No acute osseous process though bone windows have not been submitted. Minimal grade 1 C3-4 anterolisthesis. Severe RIGHT C3-4 neural foraminal narrowing. Severe atlantodental osteoarthrosis. OTHER NECK: Soft tissues of the neck are nonacute though, not tailored for evaluation. Thyromegaly, no dominant nodule by CT. UPPER CHEST: Included lung apices are clear. Mild centrilobular emphysema. No superior mediastinal lymphadenopathy. CTA HEAD FINDINGS: ANTERIOR CIRCULATION: Patent cervical  internal carotid arteries, petrous, cavernous and supra clinoid internal carotid arteries. Patent anterior communicating artery. Patent anterior and middle cerebral arteries. No large vessel occlusion, significant stenosis, contrast extravasation or aneurysm. POSTERIOR CIRCULATION: Patent vertebral arteries, vertebrobasilar junction and basilar artery, as well as main branch vessels. Patent posterior cerebral arteries. Bilateral posterior communicating arteries present, infundibulum on the RIGHT. No large vessel occlusion, significant stenosis, contrast extravasation or aneurysm. VENOUS SINUSES: Major dural venous sinuses are patent though not tailored for evaluation on this angiographic examination. ANATOMIC VARIANTS: None. DELAYED PHASE: No abnormal intracranial enhancement. MIP images reviewed. IMPRESSION: CT HEAD: 1. Negative CT HEAD with and without contrast for age. CTA NECK: 1. Less than 50% stenosis RIGHT ICA. No hemodynamically significant stenosis LEFT ICA. 2. Patent vertebral arteries. 3. Severe RIGHT C3-4 neural foraminal narrowing. CTA HEAD: 1. No flow-limiting stenosis or emergent large vessel occlusion. Complete circle-of-Willis. Aortic Atherosclerosis (ICD10-I70.0). Emphysema (ICD10-J43.9). Electronically Signed   By: Michel Santee.D.  On: 09/01/2017 01:26   Ct Angio Neck W And/or Wo Contrast  Result Date: 09/01/2017 CLINICAL DATA:  Fever, headache and neck stiffness with nausea since August 31, 2017 at 1700 hours. History of sarcoidosis, hypertension, diabetes. EXAM: CT ANGIOGRAPHY HEAD AND NECK TECHNIQUE: Multidetector CT imaging of the head and neck was performed using the standard protocol during bolus administration of intravenous contrast. Multiplanar CT image reconstructions and MIPs were obtained to evaluate the vascular anatomy. Carotid stenosis measurements (when applicable) are obtained utilizing NASCET criteria, using the distal internal carotid diameter as the denominator.  CONTRAST:  ISOVUE-370 IOPAMIDOL (ISOVUE-370) INJECTION 76% COMPARISON:  CT HEAD June 22, 2012 and MRI/MRA head November 01, 2009 and thyroid ultrasound December 20, 2013. FINDINGS: CT HEAD FINDINGS BRAIN: No intraparenchymal hemorrhage, mass effect nor midline shift. The ventricles and sulci are normal for age. Patchy supratentorial white matter hypodensities within normal range for patient's age, though non-specific are most compatible with chronic small vessel ischemic disease. No acute large vascular territory infarcts. No abnormal extra-axial fluid collections. Basal cisterns are patent. 6 mm pineal cyst. VASCULAR: Moderate calcific atherosclerosis of the carotid siphons. SKULL: No skull fracture. No significant scalp soft tissue swelling. SINUSES/ORBITS: Trace paranasal sinus mucosal thickening. Mastoid air cells are well aerated.The included ocular globes and orbital contents are non-suspicious. OTHER: None. CTA NECK FINDINGS: AORTIC ARCH: Normal appearance of the thoracic arch, 2 vessel arch is a normal variant. Mild intimal thickening calcific atherosclerosis. The origins of the innominate, left Common carotid artery and subclavian artery are widely patent. RIGHT CAROTID SYSTEM: Common carotid artery is patent. Atherosclerosis resulting in less than 50% stenosis RIGHT ICA by NASCET criteria. Patent internal carotid artery. LEFT CAROTID SYSTEM: Common carotid artery is patent. Mild calcific atherosclerosis of the carotid bifurcation without hemodynamically significant stenosis by NASCET criteria. Normal appearance of the internal carotid artery. VERTEBRAL ARTERIES:Codominant vertebral arteries. Patent vertebral arteries with extrinsic compression due to degenerative cervical spine. No suspicious luminal irregularity or flow-limiting stenosis. SKELETON: No acute osseous process though bone windows have not been submitted. Minimal grade 1 C3-4 anterolisthesis. Severe RIGHT C3-4 neural foraminal narrowing.  Severe atlantodental osteoarthrosis. OTHER NECK: Soft tissues of the neck are nonacute though, not tailored for evaluation. Thyromegaly, no dominant nodule by CT. UPPER CHEST: Included lung apices are clear. Mild centrilobular emphysema. No superior mediastinal lymphadenopathy. CTA HEAD FINDINGS: ANTERIOR CIRCULATION: Patent cervical internal carotid arteries, petrous, cavernous and supra clinoid internal carotid arteries. Patent anterior communicating artery. Patent anterior and middle cerebral arteries. No large vessel occlusion, significant stenosis, contrast extravasation or aneurysm. POSTERIOR CIRCULATION: Patent vertebral arteries, vertebrobasilar junction and basilar artery, as well as main branch vessels. Patent posterior cerebral arteries. Bilateral posterior communicating arteries present, infundibulum on the RIGHT. No large vessel occlusion, significant stenosis, contrast extravasation or aneurysm. VENOUS SINUSES: Major dural venous sinuses are patent though not tailored for evaluation on this angiographic examination. ANATOMIC VARIANTS: None. DELAYED PHASE: No abnormal intracranial enhancement. MIP images reviewed. IMPRESSION: CT HEAD: 1. Negative CT HEAD with and without contrast for age. CTA NECK: 1. Less than 50% stenosis RIGHT ICA. No hemodynamically significant stenosis LEFT ICA. 2. Patent vertebral arteries. 3. Severe RIGHT C3-4 neural foraminal narrowing. CTA HEAD: 1. No flow-limiting stenosis or emergent large vessel occlusion. Complete circle-of-Willis. Aortic Atherosclerosis (ICD10-I70.0). Emphysema (ICD10-J43.9). Electronically Signed   By: Awilda Metro M.D.   On: 09/01/2017 01:26    Procedures Procedures (including critical care time)  Medications Ordered in ED Medications  morphine 4 MG/ML injection 4 mg (  4 mg Intravenous Given 08/31/17 2306)  iopamidol (ISOVUE-370) 76 % injection 100 mL (100 mLs Intravenous Contrast Given 09/01/17 0052)  lidocaine (XYLOCAINE) 2 % (with pres)  injection 200 mg (200 mg Intradermal Given by Other 09/01/17 0346)     Initial Impression / Assessment and Plan / ED Course  I have reviewed the triage vital signs and the nursing notes.  Pertinent labs & imaging results that were available during my care of the patient were reviewed by me and considered in my medical decision making (see chart for details).     67 year old Caucasian female presented to the ED for evaluation of acute onset of sudden neck pain and headache.  She also reports a low-grade temperature at home of 101.  Patient denies any recent tick bites, cough, urinary symptoms.  Patient noted to be hypertensive in triage after taking her blood pressure medicine today with history of hypertension.  Patient has been afebrile in the ED.  No tachycardia noted.  On exam patient does appear uncomfortable.  She has no focal neuro deficits.  Patient has some pain with range of motion of the neck.  Heart regular rate and rhythm.  Lungs clear to auscultation bilaterally.  Lab work reassuring.  Patient has no leukocytosis.  Normal hemoglobin.  No significant electrolyte derangement.  Mild elevation in AST.  Normal kidney function.  Normal lactic acid.  Blood cultures are pending.  Mild thrombocytopenia of 114 history of same.  Will need outpatient follow-up.  Initial concern for subarachnoid hemorrhage versus meningitis.  Given patient's severity and headache and hypertension CTA of neck and head was ordered.  CTA of neck and head returned that showed some mild right-sided carotid stenosis but no other acute abnormalities.  Normal CT scan of head.  No signs of aneurysm.  Patient's pain significantly improved with 1 dose of morphine and Zofran in the ED.  She has had no further vomiting and states that her headache has almost completely resolved.  Patient does have range of motion of her neck at this time.  However given her significant neck pain on arrival with her stated fever at home  offered patient lumbar puncture.  She has agreed to a lumbar puncture.  Consent was signed and this was performed by attending Dr. Adela Lank.  The lumbar puncture was a bloody tap.  She tolerated without any difficulties.  CSF glucose was normal.  CSF protein was elevated however I suspect this is from a bloody tap given that the first tube had significant blood clot in the tube and unable to perform test.  Tube for does have increased red blood cells with normal WBCs and no bacteria seen.  Small amount of WBCs present.  No signs of meningitis on the lumbar puncture.  Patient has remained without a headache and fever in the ED.  She has full range of motion of her neck at this time.  She feels significantly improved.  I did discuss the case with Dr. Franky Macho with neurosurgery given the RBC count in the fourth CSF tube.  He states that given the normal CTA of head and neck low suspicion for subarachnoid hemorrhage.  Patient's blood pressure improved in the ED.  He has no further recommendations.  Discussed findings with patient.  She continues to feel improved in the ED.  After discussion with my attending feel comfortable patient to be discharged home.  Unknown etiology of patient's symptoms however low suspicion for meningitis and subarachnoid bleed at this time.  Possibly  musculoskeletal in nature versus migraine headache.  I did discuss with patient that she needs close outpatient follow-up and discussed reasons to return the ED immediately.  Patient seen by my attending as well.  They are agreeable the above plan.  Pt is hemodynamically stable, in NAD, & able to ambulate in the ED. Evaluation does not show pathology that would require ongoing emergent intervention or inpatient treatment. I explained the diagnosis to the patient. Pain has been managed & has no complaints prior to dc. Pt is comfortable with above plan and is stable for discharge at this time. All questions were answered prior to  disposition. Strict return precautions for f/u to the ED were discussed. Encouraged follow up with PCP.  Final Clinical Impressions(s) / ED Diagnoses   Final diagnoses:  Acute nonintractable headache, unspecified headache type  Neck pain    ED Discharge Orders    None       Wallace KellerLeaphart, Tahlor Berenguer T, PA-C 09/02/17 0320    Lorre NickAllen, Anthony, MD 09/05/17 [phone]

## 2017-08-31 NOTE — ED Triage Notes (Signed)
Pt complains of a severe headache and neck ache since 5pm this afternoon, she states she had a fever earlier and took 600 mg ibuprofen and a robaxin, with little relief Pt has an elevated blood pressure and she states that she is nauseated

## 2017-08-31 NOTE — ED Provider Notes (Signed)
Medical screening examination/treatment/procedure(s) were conducted as a shared visit with non-physician practitioner(s) and myself.  I personally evaluated the patient during the encounter.  None 67 year old female presents with acute onset of neck pain was then developed into a headache.  She developed a low-grade temp of 101.  She has had nausea but no vomiting.  Denies any severe photophobia.  No confusion noted.  She has no rashes.  On exam she states that her neck feels like the muscles are tight.  Awaiting imaging at this time.   Lorre NickAllen, Nikitta Sobiech, MD 08/31/17 [phone]

## 2017-09-01 ENCOUNTER — Encounter (HOSPITAL_COMMUNITY): Payer: Self-pay

## 2017-09-01 ENCOUNTER — Emergency Department (HOSPITAL_COMMUNITY): Payer: Medicare Other

## 2017-09-01 LAB — CSF CELL COUNT WITH DIFFERENTIAL
Eosinophils, CSF: 1 % (ref 0–1)
Lymphs, CSF: 38 % — ABNORMAL LOW (ref 40–80)
Monocyte-Macrophage-Spinal Fluid: 8 % — ABNORMAL LOW (ref 15–45)
RBC Count, CSF: 2980 /mm3 — ABNORMAL HIGH
RBC Count, CSF: UNDETERMINED /mm3
Segmented Neutrophils-CSF: 53 % — ABNORMAL HIGH (ref 0–6)
Tube #: 1
Tube #: 4
WBC, CSF: 4 /mm3 (ref 0–5)
WBC, CSF: UNDETERMINED /mm3 (ref 0–5)

## 2017-09-01 LAB — COMPREHENSIVE METABOLIC PANEL WITH GFR
ALT: 44 U/L (ref 0–44)
AST: 50 U/L — ABNORMAL HIGH (ref 15–41)
Albumin: 4 g/dL (ref 3.5–5.0)
Alkaline Phosphatase: 109 U/L (ref 38–126)
Anion gap: 10 (ref 5–15)
BUN: 21 mg/dL (ref 8–23)
CO2: 26 mmol/L (ref 22–32)
Calcium: 10 mg/dL (ref 8.9–10.3)
Chloride: 104 mmol/L (ref 98–111)
Creatinine, Ser: 0.97 mg/dL (ref 0.44–1.00)
GFR calc Af Amer: >60 mL/min
GFR calc non Af Amer: 59 mL/min — ABNORMAL LOW
Glucose, Bld: 160 mg/dL — ABNORMAL HIGH (ref 70–99)
Potassium: 3.7 mmol/L (ref 3.5–5.1)
Sodium: 140 mmol/L (ref 135–145)
Total Bilirubin: 0.8 mg/dL (ref 0.3–1.2)
Total Protein: 7.6 g/dL (ref 6.5–8.1)

## 2017-09-01 LAB — GLUCOSE, CSF: Glucose, CSF: 66 mg/dL (ref 40–70)

## 2017-09-01 LAB — PROTEIN, CSF: TOTAL PROTEIN, CSF: 249 mg/dL — AB (ref 15–45)

## 2017-09-01 MED ORDER — LIDOCAINE HCL 2 % IJ SOLN
10.0000 mL | Freq: Once | INTRAMUSCULAR | Status: AC
  Administered 2017-09-01: 200 mg via INTRADERMAL
  Filled 2017-09-01: qty 20

## 2017-09-01 MED ORDER — IOPAMIDOL (ISOVUE-370) INJECTION 76%
INTRAVENOUS | Status: AC
  Filled 2017-09-01: qty 100

## 2017-09-01 MED ORDER — IOPAMIDOL (ISOVUE-370) INJECTION 76%
100.0000 mL | Freq: Once | INTRAVENOUS | Status: AC | PRN
  Administered 2017-09-01: 100 mL via INTRAVENOUS

## 2017-09-01 NOTE — Discharge Instructions (Signed)
Your work-up has been reassuring in the emergency department today.  We have discussed your findings with you.  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  Also have degenerative changes in your cervical spine that can be followed up in outpatient setting.  Please see your primary care doctor return to ED immediately if you develop any sudden onset of headache again, vision changes, fevers or for any other reason.

## 2017-09-02 NOTE — ED Provider Notes (Signed)
.  Lumbar Puncture Date/Time: 09/02/2017 6:40 AM Performed by: Melene PlanFloyd, Rolande Moe, DO Authorized by: Melene PlanFloyd, Thanos Cousineau, DO   Consent:    Consent obtained:  Written   Consent given by:  Patient   Risks discussed:  Bleeding, infection, headache and nerve damage   Alternatives discussed:  No treatment, delayed treatment, alternative treatment and observation Pre-procedure details:    Procedure purpose:  Diagnostic   Preparation: Patient was prepped and draped in usual sterile fashion   Anesthesia (see MAR for exact dosages):    Anesthesia method:  Local infiltration   Local anesthetic:  Lidocaine 1% w/o epi Procedure details:    Lumbar space:  L3-L4 interspace   Patient position:  Sitting   Needle gauge:  22   Needle type:  Spinal needle - Quincke tip   Needle length (in):  3.5   Ultrasound guidance: no     Number of attempts:  4   Fluid appearance:  Bloody and blood-tinged then clearing   Tubes of fluid:  4   Total volume (ml):  3 Post-procedure:    Puncture site:  Adhesive bandage applied and direct pressure applied   Patient tolerance of procedure:  Tolerated well, no immediate complications   67 yo F with a chief complaint of headache.  This was acute in onset included her neck.  Was sharp and severe and terrible.  Is never had a headache like this before.  Denies neurologic deficit but felt like she was having a fever and got diaphoretic.  CT angiogram of the head and neck was negative for aneurysm or acute intracranial hemorrhage.  Risks and benefits of lumbar puncture were discussed with the patient today and she agreed to have one performed.  This unfortunately was a traumatic tap and she had over 2000 red cells in tube 4.  Luckily she only had 4 whites and so very unlikely that she has meningitis.  Case was discussed with neurosurgery who felt that with improving symptoms a normal CT angiogram that no further testing was necessary.  Discharge home with strict return precautions.   Melene PlanFloyd, Aronda Burford,  DO 09/02/17 [phone]

## 2017-09-04 LAB — CSF CULTURE
CULTURE: NO GROWTH
SPECIAL REQUESTS: NORMAL

## 2017-09-06 LAB — CULTURE, BLOOD (ROUTINE X 2)
Culture: NO GROWTH
Special Requests: ADEQUATE

## 2017-09-16 ENCOUNTER — Other Ambulatory Visit: Payer: Self-pay | Admitting: Orthopaedic Surgery

## 2017-09-16 DIAGNOSIS — M542 Cervicalgia: Secondary | ICD-10-CM

## 2017-09-23 ENCOUNTER — Ambulatory Visit
Admission: RE | Admit: 2017-09-23 | Discharge: 2017-09-23 | Disposition: A | Discharge status: Home / Self Care | Payer: Medicare Other | Source: Ambulatory Visit | Attending: Orthopaedic Surgery | Admitting: Orthopaedic Surgery

## 2017-09-23 DIAGNOSIS — M542 Cervicalgia: Secondary | ICD-10-CM

## 2017-11-15 ENCOUNTER — Other Ambulatory Visit: Payer: Self-pay | Admitting: Internal Medicine

## 2017-11-15 DIAGNOSIS — Z1231 Encounter for screening mammogram for malignant neoplasm of breast: Secondary | ICD-10-CM

## 2017-12-29 ENCOUNTER — Ambulatory Visit
Admission: RE | Admit: 2017-12-29 | Discharge: 2017-12-29 | Disposition: A | Discharge status: Home / Self Care | Payer: Medicare Other | Source: Ambulatory Visit | Attending: Internal Medicine | Admitting: Internal Medicine

## 2017-12-29 DIAGNOSIS — Z1231 Encounter for screening mammogram for malignant neoplasm of breast: Secondary | ICD-10-CM

## 2018-01-02 ENCOUNTER — Other Ambulatory Visit: Payer: Self-pay | Admitting: Internal Medicine

## 2018-01-02 DIAGNOSIS — R928 Other abnormal and inconclusive findings on diagnostic imaging of breast: Secondary | ICD-10-CM

## 2018-01-05 ENCOUNTER — Ambulatory Visit
Admission: RE | Admit: 2018-01-05 | Discharge: 2018-01-05 | Disposition: A | Discharge status: Home / Self Care | Payer: Medicare Other | Source: Ambulatory Visit | Attending: Internal Medicine | Admitting: Internal Medicine

## 2018-01-05 ENCOUNTER — Other Ambulatory Visit: Payer: Self-pay | Admitting: Internal Medicine

## 2018-01-05 DIAGNOSIS — R928 Other abnormal and inconclusive findings on diagnostic imaging of breast: Secondary | ICD-10-CM

## 2018-01-05 DIAGNOSIS — N6320 Unspecified lump in the left breast, unspecified quadrant: Secondary | ICD-10-CM

## 2018-02-07 ENCOUNTER — Encounter (HOSPITAL_COMMUNITY): Payer: Self-pay

## 2018-02-07 ENCOUNTER — Other Ambulatory Visit: Payer: Self-pay

## 2018-02-07 ENCOUNTER — Emergency Department (HOSPITAL_COMMUNITY): Payer: Medicare Other

## 2018-02-07 ENCOUNTER — Emergency Department (HOSPITAL_COMMUNITY)
Admission: EM | Admit: 2018-02-07 | Discharge: 2018-02-07 | Disposition: A | Discharge status: Home / Self Care | Payer: Medicare Other | Attending: Emergency Medicine | Admitting: Emergency Medicine

## 2018-02-07 DIAGNOSIS — R079 Chest pain, unspecified: Secondary | ICD-10-CM

## 2018-02-07 DIAGNOSIS — I2510 Atherosclerotic heart disease of native coronary artery without angina pectoris: Secondary | ICD-10-CM | POA: Diagnosis not present

## 2018-02-07 DIAGNOSIS — Z7984 Long term (current) use of oral hypoglycemic drugs: Secondary | ICD-10-CM | POA: Diagnosis not present

## 2018-02-07 DIAGNOSIS — Z79899 Other long term (current) drug therapy: Secondary | ICD-10-CM | POA: Insufficient documentation

## 2018-02-07 DIAGNOSIS — E039 Hypothyroidism, unspecified: Secondary | ICD-10-CM | POA: Diagnosis not present

## 2018-02-07 DIAGNOSIS — E119 Type 2 diabetes mellitus without complications: Secondary | ICD-10-CM | POA: Insufficient documentation

## 2018-02-07 DIAGNOSIS — I10 Essential (primary) hypertension: Secondary | ICD-10-CM | POA: Diagnosis not present

## 2018-02-07 HISTORY — DX: Atherosclerotic heart disease of native coronary artery without angina pectoris: I25.10

## 2018-02-07 LAB — BASIC METABOLIC PANEL
ANION GAP: 10 (ref 5–15)
BUN: 15 mg/dL (ref 8–23)
CHLORIDE: 104 mmol/L (ref 98–111)
CO2: 24 mmol/L (ref 22–32)
Calcium: 9.9 mg/dL (ref 8.9–10.3)
Creatinine, Ser: 0.87 mg/dL (ref 0.44–1.00)
GFR calc Af Amer: >60 mL/min (ref >60)
GFR calc non Af Amer: >60 mL/min (ref >60)
Glucose, Bld: 137 mg/dL — ABNORMAL HIGH (ref 70–99)
POTASSIUM: 3.7 mmol/L (ref 3.5–5.1)
Sodium: 138 mmol/L (ref 135–145)

## 2018-02-07 LAB — CBC
HEMATOCRIT: 40.5 % (ref 36.0–46.0)
HEMOGLOBIN: 13.3 g/dL (ref 12.0–15.0)
MCH: 29 pg (ref 26.0–34.0)
MCHC: 32.8 g/dL (ref 30.0–36.0)
MCV: 88.2 fL (ref 80.0–100.0)
Platelets: 165 10*3/uL (ref 150–400)
RBC: 4.59 MIL/uL (ref 3.87–5.11)
RDW: 12.5 % (ref 11.5–15.5)
WBC: 6.6 10*3/uL (ref 4.0–10.5)
nRBC: 0 % (ref 0.0–0.2)

## 2018-02-07 LAB — TROPONIN I
Troponin I: <0.03 ng/mL (ref <0.03)
Troponin I: <0.03 ng/mL (ref <0.03)

## 2018-02-07 LAB — TSH: TSH: 0.491 u[IU]/mL (ref 0.350–4.500)

## 2018-02-07 MED ORDER — SODIUM CHLORIDE 0.9% FLUSH: 3.0000 mL | Freq: Once | INTRAVENOUS | Status: DC

## 2018-02-07 NOTE — ED Notes (Addendum)
Per pt: Pt had blood drawn in triage by Viviann Spare in phlebotomy.

## 2018-02-07 NOTE — Discharge Instructions (Signed)
Follow-up with cardiology in the next few days.  The contact information has been provided in this discharge summary for you to call and make these arrangements.  Return to the emergency department in the meantime if symptoms worsen or change.

## 2018-02-07 NOTE — ED Notes (Signed)
Pt requesting to not have IV placed at this time.

## 2018-02-07 NOTE — ED Triage Notes (Signed)
Patient states she woke from a nap at 1500 today and felt like her heart was beating out of her chest and having chest pressure. patient c/o slight SOB. Patient denies any N/V.

## 2018-02-07 NOTE — ED Notes (Signed)
Pt stated "I am feeling much better and ready to go as soon as possible"

## 2018-02-07 NOTE — ED Notes (Signed)
ED Provider at bedside. 

## 2018-02-07 NOTE — ED Provider Notes (Signed)
[facility]-EMERGENCY DEPT Provider Note   CSN: [id number] Arrival date & time: 02/07/18  1619     History   Chief Complaint Chief Complaint  Patient presents with  . Chest Pain    HPI [name] is a 68 y.o. female.  Patient is a 68 year old female with past medical history of diabetes, hypertension, sarcoidosis.  She presents today for evaluation of chest discomfort.  She woke from sleep this afternoon at approximately 3 PM after a nap.  She reports tightness across the front of her chest into her left shoulder and breast with associated shortness of breath.  She also felt as if her "heart was pounding out of her chest".  This persisted for approximately 1 hour, then she presents here for evaluation.  She denies any recent exertional symptoms.  Patient does have cardiac risk factors including hypertension, diabetes, and family history.  She does report having had a stress test approximately 2 years ago which she was told was unremarkable.  The history is provided by the patient.  Chest Pain  Pain location:  Substernal area Pain quality: tightness   Pain radiates to:  Does not radiate Pain severity:  Moderate Onset quality:  Sudden Duration:  2 hours Timing:  Constant Progression:  Partially resolved Chronicity:  New Relieved by:  Nothing Worsened by:  Nothing Ineffective treatments:  None tried Associated symptoms: palpitations and shortness of breath   Associated symptoms: no fever and no vomiting     Past Medical History:  Diagnosis Date  . CAD (coronary artery disease)   . Complication of anesthesia    trouble waking up once  . Diabetes mellitus without complication (HCC)   . Hypertension   . Hypothyroidism   . Liver disease    hx tumor in liver, ? fatty liver  . Sarcoidosis   . Sarcoidosis of lung (HCC)   . Sarcoidosis of lymph nodes     Patient Active Problem List   Diagnosis Date Noted  . Syncope 06/22/2012  . Dehydration  06/22/2012  . AKI (acute kidney injury) (HCC) 06/22/2012  . Unspecified hypothyroidism 06/22/2012  . HTN (hypertension) 06/22/2012  . Type II or unspecified type diabetes mellitus without mention of complication, uncontrolled 06/22/2012  . Sarcoidosis 06/22/2012  . Heart murmur 06/22/2012  . UTI (urinary tract infection) 06/22/2012    Past Surgical History:  Procedure Laterality Date  . ABDOMINAL HYSTERECTOMY    . ABDOMINAL SURGERY    . CHOLECYSTECTOMY    . HERNIA REPAIR    . nissenfundiplication       OB History   No obstetric history on file.      Home Medications    Prior to Admission medications   Medication Sig Start Date End Date Taking? Authorizing Provider  BENICAR HCT 40-25 MG tablet Take 1 tablet by mouth daily after breakfast.  11/19/14   [provider]  calcium-vitamin D (OSCAL WITH D) 500-200 MG-UNIT tablet Take 1 tablet by mouth daily with breakfast.    [provider]  furosemide (LASIX) 20 MG tablet Take 20 mg by mouth daily as needed for fluid or edema.  11/10/14   [provider]  levothyroxine (SYNTHROID, LEVOTHROID) 150 MCG tablet Take 75-150 mcg by mouth daily before breakfast. Pt alternates between 150 mcg and 75 mcg each day    [provider]  loratadine (CLARITIN) 10 MG tablet Take 10 mg by mouth daily as needed for allergies.     [provider]  metFORMIN (GLUCOPHAGE-XR) 500 MG 24 hr tablet Take 500 mg by mouth daily with breakfast.  12/21/14   [provider]  zolpidem (AMBIEN) 10 MG tablet Take 10 mg by mouth at bedtime as needed for sleep.    [provider]    Family History Family History  Problem Relation Age of Onset  . Heart failure Father   . Hypertension Father     Social History Social History   Tobacco Use  . Smoking status: Never Smoker  . Smokeless tobacco: Never Used  Substance Use Topics  . Alcohol use: No  . Drug use: No     Allergies   Sulfa  antibiotics   Review of Systems Review of Systems  Constitutional: Negative for fever.  Respiratory: Positive for shortness of breath.   Cardiovascular: Positive for chest pain and palpitations.  Gastrointestinal: Negative for vomiting.  All other systems reviewed and are negative.    Physical Exam Updated Vital Signs BP (!) 138/47   Pulse 88   Temp 98.7 F (37.1 C) (Oral)   Resp 15   Ht 5\' 2"  (1.575 m)   Wt 79.4 kg   SpO2 97%   BMI 32.01 kg/m   Physical Exam Vitals signs and nursing note reviewed.  Constitutional:      General: She is not in acute distress.    Appearance: She is well-developed. She is not diaphoretic.  HENT:     Head: Normocephalic and atraumatic.  Neck:     Musculoskeletal: Normal range of motion and neck supple.  Cardiovascular:     Rate and Rhythm: Normal rate and regular rhythm.     Heart sounds: No murmur. No friction rub. No gallop.   Pulmonary:     Effort: Pulmonary effort is normal. No respiratory distress.     Breath sounds: Normal breath sounds. No wheezing.  Abdominal:     General: Bowel sounds are normal. There is no distension.     Palpations: Abdomen is soft.     Tenderness: There is no abdominal tenderness.  Musculoskeletal: Normal range of motion.     Right lower leg: She exhibits no tenderness. No edema.     Left lower leg: She exhibits no tenderness. No edema.  Skin:    General: Skin is warm and dry.  Neurological:     Mental Status: She is alert and oriented to person, place, and time.      ED Treatments / Results  Labs (all labs ordered are listed, but only abnormal results are displayed) Labs Reviewed  CBC  BASIC METABOLIC PANEL  TROPONIN I  TSH    EKG EKG Interpretation  Date/Time:  Tuesday February 07 2018 16:33:56 EST Ventricular Rate:  108 PR Interval:    QRS Duration: 97 QT Interval:  343 QTC Calculation: 460 R Axis:   19 Text Interpretation:  Sinus tachycardia Multiple ventricular premature  complexes Borderline T wave abnormalities Confirmed by Geoffery LyonseLo, Dandrae Kustra ([id number]) on 02/07/2018 5:31:07 PM   Radiology No results found.  Procedures Procedures (including critical care time)  Medications Ordered in ED Medications  sodium chloride flush (NS) 0.9 % injection 3 mL (0 mLs Intravenous Hold 02/07/18 1706)     Initial Impression / Assessment and Plan / ED Course  I have reviewed the triage vital signs and the nursing notes.  Pertinent labs & imaging results that were available during my care of the patient were reviewed by me and considered in my medical decision making (see chart for details).  Patient presents here with complaints of chest discomfort, the etiology of which I am uncertain.  Her work-up thus far is unremarkable including EKG and troponin x2.  She does have frequent PVCs, but no other obvious changes on her EKG.  Patient is adamant about going home.  She does have risk factors and understands that we have not completely ruled out her heart as the culprit, but would prefer further work-up as an outpatient.  My plan is for her to follow-up with cardiology and return if she worsens.  Final Clinical Impressions(s) / ED Diagnoses   Final diagnoses:  None    ED Discharge Orders    None       Geoffery Lyons, MD 02/07/18 2100

## 2018-02-07 NOTE — ED Notes (Signed)
Patient transported to X-ray 

## 2018-02-07 NOTE — ED Notes (Signed)
Pt verbalized discharge instructions and follow up care. Alert and ambulatory  

## 2018-02-08 ENCOUNTER — Encounter: Payer: Self-pay | Admitting: Cardiology

## 2018-02-08 ENCOUNTER — Ambulatory Visit: Payer: Medicare Other | Admitting: Cardiology

## 2018-02-08 VITALS — BP 144/68 | HR 76 | Ht 62.0 in | Wt 178.2 lb

## 2018-02-08 DIAGNOSIS — R011 Cardiac murmur, unspecified: Secondary | ICD-10-CM | POA: Diagnosis not present

## 2018-02-08 DIAGNOSIS — E782 Mixed hyperlipidemia: Secondary | ICD-10-CM

## 2018-02-08 DIAGNOSIS — I493 Ventricular premature depolarization: Secondary | ICD-10-CM

## 2018-02-08 DIAGNOSIS — R079 Chest pain, unspecified: Secondary | ICD-10-CM | POA: Diagnosis not present

## 2018-02-08 DIAGNOSIS — I10 Essential (primary) hypertension: Secondary | ICD-10-CM

## 2018-02-08 DIAGNOSIS — R002 Palpitations: Secondary | ICD-10-CM

## 2018-02-08 DIAGNOSIS — R0609 Other forms of dyspnea: Secondary | ICD-10-CM

## 2018-02-08 NOTE — Progress Notes (Signed)
Cardiology Office Note:    Date:  02/08/2018   ID:  [name] RuskMary L Ballowe, DOB [birth year], MRN [id number]  PCP:  Ralene OkMoreira, Roy, MD  Cardiologist:  Jodelle RedBridgette Shelisa Fern, MD PhD  Referring MD: Ralene OkMoreira, Roy, MD   CC: ER presentation for chest pain/palpitations  History of Present Illness:    [name] is a 68 y.o. female with a hx of type II diabetes, hypertension, sarcoidosis who is seen as a new consult at the request of Ralene OkMoreira, Roy, MD for the evaluation and management of chest pain.  Chest pain: -Initial onset: yesterday, after waking up from a nape -Quality: pounding, pressure -Frequency: never happened before -Duration: 3 hours -Associated symptoms: sweaty, irregular rhythm -Aggravating/alleviating factors: nothing that she tried, no better with rest or relaxation -Prior cardiac history: prior echo, told she has minor carotid stenosis -Prior ECG: yesterday, ventricular quadrigeminy -Prior workup: echo 2014, distant stress test (told it was normal) -Prior treatment: none -Alcohol: none -Tobacco: never -Comorbidities: type II diabetes, hypertension, sarcoidosis -Exercise level: takes care of 4 horses, was wlaking 2 miles/day and now backed down to 1 mile/day several days/week -Cardiac ROS: no PND, no orthopnea, no LE edema, no syncope.Has shortness of breath for several years going uphill due to sarcoidosis -Family history: mother 29(73), father 73(82), 2 brothers (age 33--died from MI, another age 70) all died from heart attacks. Sister has had several stents/bypasses (in her early 1460s now).   Past Medical History:  Diagnosis Date  . CAD (coronary artery disease)   . Complication of anesthesia    trouble waking up once  . Diabetes mellitus without complication (HCC)   . Hypertension   . Hypothyroidism   . Liver disease    hx tumor in liver, ? fatty liver  . Sarcoidosis   . Sarcoidosis of lung (HCC)   . Sarcoidosis of lymph nodes     Past Surgical History:  Procedure  Laterality Date  . ABDOMINAL HYSTERECTOMY    . ABDOMINAL SURGERY    . CHOLECYSTECTOMY    . HERNIA REPAIR    . nissenfundiplication      Current Medications: Current Outpatient Medications on File Prior to Visit  Medication Sig  . baclofen (LIORESAL) 10 MG tablet Take 10 mg by mouth 3 (three) times daily as needed for pain.  . calcium-vitamin D (OSCAL WITH D) 500-200 MG-UNIT tablet Take 1 tablet by mouth daily with breakfast.  . furosemide (LASIX) 20 MG tablet Take 20 mg by mouth daily as needed for fluid or edema.   Marland Kitchen. levothyroxine (SYNTHROID, LEVOTHROID) 150 MCG tablet Take 75-150 mcg by mouth daily before breakfast. Pt alternates between 150 mcg and 75 mcg each day  . loratadine (CLARITIN) 10 MG tablet Take 10 mg by mouth daily as needed for allergies.   . metFORMIN (GLUCOPHAGE-XR) 500 MG 24 hr tablet Take 500 mg by mouth daily with breakfast.   . Olmesartan-amLODIPine-HCTZ 40-10-25 MG TABS Take 1 tablet by mouth daily.  Marland Kitchen. omeprazole (PRILOSEC) 40 MG capsule Take 40 mg by mouth daily.  Marland Kitchen. zolpidem (AMBIEN) 10 MG tablet Take 10 mg by mouth at bedtime as needed for sleep.   No current facility-administered medications on file prior to visit.      Allergies:   Sulfa antibiotics   Social History   Socioeconomic History  . Marital status: Married    Spouse name: Not on file  . Number of children: Not on file  . Years of education: Not on file  . Highest education  level: Not on file  Occupational History  . Not on file  Social Needs  . Financial resource strain: Not on file  . Food insecurity:    Worry: Not on file    Inability: Not on file  . Transportation needs:    Medical: Not on file    Non-medical: Not on file  Tobacco Use  . Smoking status: Never Smoker  . Smokeless tobacco: Never Used  Substance and Sexual Activity  . Alcohol use: No  . Drug use: No  . Sexual activity: Yes  Lifestyle  . Physical activity:    Days per week: Not on file    Minutes per session:  Not on file  . Stress: Not on file  Relationships  . Social connections:    Talks on phone: Not on file    Gets together: Not on file    Attends religious service: Not on file    Active member of club or organization: Not on file    Attends meetings of clubs or organizations: Not on file    Relationship status: Not on file  Other Topics Concern  . Not on file  Social History Narrative  . 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  Family History: The patient's family history includes Heart attack in her brother, brother, father, and mother; Heart failure in her father; Hypertension in her father.  ROS:   Please see the history of present illness.  Additional pertinent ROS:  Constitutional: Negative for chills, fever, night sweats, unintentional weight loss  HENT: Negative for ear pain and hearing loss.   Eyes: Negative for loss of vision and eye pain.  Respiratory: Negative for cough, sputum, shortness of breath, wheezing.   Cardiovascular: Positive for chest discomfort, palpitations. Negative for PND, orthopnea, lower extremity edema and claudication.  Gastrointestinal: Negative for abdominal pain, melena, and hematochezia.  Genitourinary: Negative for dysuria and hematuria.  Musculoskeletal: Negative for falls and myalgias.  Skin: Negative for itching and rash.  Neurological: Negative for focal weakness, focal sensory changes and loss of consciousness.  Endo/Heme/Allergies: Does not bruise/bleed easily.    EKGs/Labs/Other Studies Reviewed:    The following studies were reviewed today: Echo 2014 Left ventricle: The cavity size was normal. Wall thickness was increased in a pattern of moderate LVH. Systolic function was vigorous. The estimated ejection fraction was in the range of 65% to 70%. Wall motion was normal; there were no regional wall motion abnormalities. - Mitral valve: Mild regurgitation.  EKG:  EKG is  personally reviewed.  The ekg ordered today demonstrates normal sinus rhythm, borderline LVH criteria  Recent Labs: 08/31/2017: ALT 44 02/07/2018: BUN 15; Creatinine, Ser 0.87; Hemoglobin 13.3; Platelets 165; Potassium 3.7; Sodium 138; TSH 0.491  Recent Lipid Panel    Component Value Date/Time   CHOL  07/11/2007 0731    182        ATP III CLASSIFICATION:  <200     mg/dL   Desirable  119-147  mg/dL   Borderline High  >=829    mg/dL   High   TRIG 562 13/08/6576 0731   HDL 31 (L) 07/11/2007 0731   CHOLHDL 5.9 07/11/2007 0731   VLDL 29 07/11/2007 0731   LDLCALC (H) 07/11/2007 0731    122        Total Cholesterol/HDL:CHD Risk Coronary Heart Disease Risk Table  Men   Women  1/2 Average Risk   3.4   3.3    Physical Exam:    VS:  BP (!) 144/68 (BP Location: Left Arm, Patient Position: Sitting, Cuff Size: Normal)   Pulse 76   Ht 5\' 2"  (1.575 m)   Wt 178 lb 3.2 oz (80.8 kg)   BMI 32.59 kg/m     Wt Readings from Last 3 Encounters:  02/08/18 178 lb 3.2 oz (80.8 kg)  02/07/18 175 lb (79.4 kg)  08/31/17 176 lb (79.8 kg)     GEN: Well nourished, well developed in no acute distress HEENT: Normal NECK: No JVD; No carotid bruits LYMPHATICS: No lymphadenopathy CARDIAC: regular rhythm, normal S1 and S2, no rubs, gallops. 3/6 holosytolic murmur. Radial and DP pulses 2+ bilaterally. RESPIRATORY:  Clear to auscultation without rales, wheezing or rhonchi  ABDOMEN: Soft, non-tender, non-distended MUSCULOSKELETAL:  No edema; No deformity  SKIN: Warm and dry NEUROLOGIC:  Alert and oriented x 3 PSYCHIATRIC:  Normal affect   ASSESSMENT:    1. Chest pain, unspecified type   2. Murmur   3. PVC (premature ventricular contraction)   4. Palpitation   5. Essential hypertension   6. Mixed hyperlipidemia   7. Dyspnea on exertion    PLAN:    1. Chest pain: does have risk factors, including family history, hyperlipidemia, hypertension, and type II diabetes. Gets very short  of breath walking up incline, does not think she can tolerate treadmill study. Has not had wheezing/asthma/need for inhaler with her sarcoidosis. Will proceed with lexiscan stress test given preference for expedited workup (vs. Waiting for coronary CTA). -lexiscan  2. Murmur: at least 3/6 today, last echo noted only mild MR -recheck echocardiogram as potential source of dyspnea on exertion (vs. Sarcoidosis)  3. Palpitations: noted ventricular quadrigeminy on ER strip.  -Zio patch to evaluate for frequency of PVCs or other arrhythmias.  4. Risk factors: hypertension, hyperlipidemia. Address further based on results of testing. Prefers to avoid too many medications if possible. Type II diabetes management per PCP, depending on results of testing would consider SGLT2 inhibitor if CAD found.  Plan for follow up: 3 mos or sooner based on results of testing  Medication Adjustments/Labs and Tests Ordered: Current medicines are reviewed at length with the patient today.  Concerns regarding medicines are outlined above.  Orders Placed This Encounter  Procedures  . MYOCARDIAL PERFUSION IMAGING  . LONG TERM MONITOR (3-14 DAYS)  . EKG 12-Lead  . ECHOCARDIOGRAM COMPLETE   No orders of the defined types were placed in this encounter.   Patient Instructions  Medication Instructions:  Your Physician recommend you continue on your current medication as directed.    If you need a refill on your cardiac medications before your next appointment, please call your pharmacy.   Lab work: None  Testing/Procedures: Our physician has recommended that you wear an 3 DAY ZIO-PATCH monitor. The Zio patch cardiac monitor continuously records heart rhythm data for up to 14 days, this is for patients being evaluated for multiple types heart rhythms. For the first 24 hours post application, please avoid getting the Zio monitor wet in the shower or by excessive sweating during exercise. After that, feel free to carry  on with regular activities. Keep soaps and lotions away from the ZIO XT Patch.   This will be placed at our [facility] location - [address], [address].    Your physician has requested that you have an echocardiogram. Echocardiography  is a painless test that uses sound waves to create images of your heart. It provides your doctor with information about the size and shape of your heart and how well your heart's chambers and valves are working. This procedure takes approximately one hour. There are no restrictions for this procedure. [address]  Your physician has requested that you have a lexiscan myoview. For further information please visit [email or web address] Please follow instruction sheet, as given. [address]     Follow-Up: At [facility], you and your health needs are our priority.  As part of our continuing mission to provide you with exceptional heart care, we have created designated Provider Care Teams.  These Care Teams include your primary Cardiologist (physician) and Advanced Practice Providers (APPs -  Physician Assistants and Nurse Practitioners) who all work together to provide you with the care you need, when you need it. You will need a follow up appointment in 3 months.  Please call our office 2 months in advance to schedule this appointment.  You may see Dr. Cristal Deer or one of the following Advanced Practice Providers on your designated Care Team:   Theodore Demark, PA-C . Joni Reining, DNP, ANP        Signed, Jodelle Red, MD PhD 02/08/2018 11:21 PM    [facility]

## 2018-02-08 NOTE — Patient Instructions (Signed)
Medication Instructions:  Your Physician recommend you continue on your current medication as directed.    If you need a refill on your cardiac medications before your next appointment, please call your pharmacy.   Lab work: None  Testing/Procedures: Our physician has recommended that you wear an 3 DAY ZIO-PATCH monitor. The Zio patch cardiac monitor continuously records heart rhythm data for up to 14 days, this is for patients being evaluated for multiple types heart rhythms. For the first 24 hours post application, please avoid getting the Zio monitor wet in the shower or by excessive sweating during exercise. After that, feel free to carry on with regular activities. Keep soaps and lotions away from the ZIO XT Patch.   This will be placed at our [facility] location - [address], [address].    Your physician has requested that you have an echocardiogram. Echocardiography is a painless test that uses sound waves to create images of your heart. It provides your doctor with information about the size and shape of your heart and how well your heart's chambers and valves are working. This procedure takes approximately one hour. There are no restrictions for this procedure. [address]. [address]  Your physician has requested that you have a lexiscan myoview. For further information please visit [email or web address] Please follow instruction sheet, as given. [address]     Follow-Up: At [facility], you and your health needs are our priority.  As part of our continuing mission to provide you with exceptional heart care, we have created designated Provider Care Teams.  These Care Teams include your primary Cardiologist (physician) and Advanced Practice Providers (APPs -  Physician Assistants and Nurse Practitioners) who all work together to provide you with the care you need, when you need it. You will need a follow up appointment in 3 months.  Please call our  office 2 months in advance to schedule this appointment.  You may see Dr. Cristal Deer or one of the following Advanced Practice Providers on your designated Care Team:   Theodore Demark, PA-C . Joni Reining, DNP, ANP

## 2018-02-22 ENCOUNTER — Telehealth (HOSPITAL_COMMUNITY): Payer: Self-pay

## 2018-02-22 NOTE — Telephone Encounter (Signed)
Patient contacted and detailed instructions given, she said that she understood.  She stated that she would be here for her test. S.Shelie Lansing EMTP

## 2018-02-23 ENCOUNTER — Encounter: Payer: Medicare Other | Admitting: *Deleted

## 2018-02-23 ENCOUNTER — Ambulatory Visit (INDEPENDENT_AMBULATORY_CARE_PROVIDER_SITE_OTHER): Payer: Medicare Other

## 2018-02-23 ENCOUNTER — Ambulatory Visit (HOSPITAL_COMMUNITY): Payer: Medicare Other | Attending: Cardiology

## 2018-02-23 ENCOUNTER — Ambulatory Visit (HOSPITAL_BASED_OUTPATIENT_CLINIC_OR_DEPARTMENT_OTHER): Payer: Medicare Other

## 2018-02-23 VITALS — Ht 62.0 in | Wt 178.0 lb

## 2018-02-23 DIAGNOSIS — R002 Palpitations: Secondary | ICD-10-CM | POA: Insufficient documentation

## 2018-02-23 DIAGNOSIS — R0609 Other forms of dyspnea: Secondary | ICD-10-CM | POA: Insufficient documentation

## 2018-02-23 DIAGNOSIS — R011 Cardiac murmur, unspecified: Secondary | ICD-10-CM | POA: Insufficient documentation

## 2018-02-23 DIAGNOSIS — I493 Ventricular premature depolarization: Secondary | ICD-10-CM

## 2018-02-23 DIAGNOSIS — R079 Chest pain, unspecified: Secondary | ICD-10-CM | POA: Diagnosis present

## 2018-02-23 DIAGNOSIS — E782 Mixed hyperlipidemia: Secondary | ICD-10-CM | POA: Diagnosis present

## 2018-02-23 DIAGNOSIS — R110 Nausea: Secondary | ICD-10-CM | POA: Diagnosis present

## 2018-02-23 DIAGNOSIS — Z006 Encounter for examination for normal comparison and control in clinical research program: Secondary | ICD-10-CM

## 2018-02-23 LAB — MYOCARDIAL PERFUSION IMAGING
LV sys vol: 29 mL
LVDIAVOL: 79 mL (ref 46–106)
Peak HR: 98 {beats}/min
Rest HR: 72 {beats}/min
SDS: 0
SRS: 6
SSS: 6
TID: 1.02

## 2018-02-23 MED ORDER — AMINOPHYLLINE 25 MG/ML IV SOLN
225.0000 mg | Freq: Once | INTRAVENOUS | Status: AC
  Administered 2018-02-23: 225 mg via INTRAVENOUS

## 2018-02-23 MED ORDER — TECHNETIUM TC 99M TETROFOSMIN IV KIT
10.8000 | PACK | Freq: Once | INTRAVENOUS | Status: AC | PRN
  Administered 2018-02-23: 10.8 via INTRAVENOUS
  Filled 2018-02-23: qty 11

## 2018-02-23 MED ORDER — REGADENOSON 0.4 MG/5ML IV SOLN
0.4000 mg | Freq: Once | INTRAVENOUS | Status: AC
  Administered 2018-02-23: 0.4 mg via INTRAVENOUS

## 2018-02-23 MED ORDER — TECHNETIUM TC 99M TETROFOSMIN IV KIT
32.8000 | PACK | Freq: Once | INTRAVENOUS | Status: AC | PRN
  Administered 2018-02-23: 32.8 via INTRAVENOUS
  Filled 2018-02-23: qty 33

## 2018-02-23 NOTE — Research (Signed)
CADFEM Informed Consent                  Subject Name:   [name]   Subject met inclusion and exclusion criteria.  The informed consent form, study requirements and expectations were reviewed with the subject and questions and concerns were addressed prior to the signing of the consent form.  The subject verbalized understanding of the trial requirements.  The subject agreed to participate in the CADFEM trial and signed the informed consent.  The informed consent was obtained prior to performance of any protocol-specific procedures for the subject.  A copy of the signed informed consent was given to the subject and a copy was placed in the subject's medical record.   [place] Chalmers, Research Assistant 02/23/18   08:59 a.m.

## 2018-05-09 ENCOUNTER — Ambulatory Visit: Payer: Medicare Other | Admitting: Cardiology

## 2018-07-13 ENCOUNTER — Ambulatory Visit
Admission: RE | Admit: 2018-07-13 | Discharge: 2018-07-13 | Disposition: A | Discharge status: Home / Self Care | Payer: Medicare Other | Source: Ambulatory Visit | Attending: Internal Medicine | Admitting: Internal Medicine

## 2018-07-13 ENCOUNTER — Other Ambulatory Visit: Payer: Self-pay

## 2018-07-13 ENCOUNTER — Other Ambulatory Visit: Payer: Self-pay | Admitting: Internal Medicine

## 2018-07-13 ENCOUNTER — Ambulatory Visit: Payer: Medicare Other

## 2018-07-13 DIAGNOSIS — N6320 Unspecified lump in the left breast, unspecified quadrant: Secondary | ICD-10-CM

## 2019-01-23 ENCOUNTER — Other Ambulatory Visit: Payer: Self-pay | Admitting: Internal Medicine

## 2019-01-23 ENCOUNTER — Ambulatory Visit
Admission: RE | Admit: 2019-01-23 | Discharge: 2019-01-23 | Disposition: A | Discharge status: Home / Self Care | Payer: Medicare Other | Source: Ambulatory Visit | Attending: Internal Medicine | Admitting: Internal Medicine

## 2019-01-23 ENCOUNTER — Other Ambulatory Visit: Payer: Self-pay

## 2019-01-23 DIAGNOSIS — N6320 Unspecified lump in the left breast, unspecified quadrant: Secondary | ICD-10-CM

## 2020-06-10 ENCOUNTER — Emergency Department (HOSPITAL_COMMUNITY)
Admission: EM | Admit: 2020-06-10 | Discharge: 2020-06-10 | Disposition: A | Discharge status: Home / Self Care | Payer: Medicare Other | Attending: Emergency Medicine | Admitting: Emergency Medicine

## 2020-06-10 ENCOUNTER — Other Ambulatory Visit: Payer: Self-pay

## 2020-06-10 ENCOUNTER — Encounter (HOSPITAL_COMMUNITY): Payer: Self-pay

## 2020-06-10 DIAGNOSIS — E119 Type 2 diabetes mellitus without complications: Secondary | ICD-10-CM | POA: Insufficient documentation

## 2020-06-10 DIAGNOSIS — E039 Hypothyroidism, unspecified: Secondary | ICD-10-CM | POA: Insufficient documentation

## 2020-06-10 DIAGNOSIS — M5412 Radiculopathy, cervical region: Secondary | ICD-10-CM | POA: Diagnosis not present

## 2020-06-10 DIAGNOSIS — R202 Paresthesia of skin: Secondary | ICD-10-CM | POA: Insufficient documentation

## 2020-06-10 DIAGNOSIS — I2510 Atherosclerotic heart disease of native coronary artery without angina pectoris: Secondary | ICD-10-CM | POA: Insufficient documentation

## 2020-06-10 DIAGNOSIS — I10 Essential (primary) hypertension: Secondary | ICD-10-CM | POA: Insufficient documentation

## 2020-06-10 DIAGNOSIS — Z79899 Other long term (current) drug therapy: Secondary | ICD-10-CM | POA: Insufficient documentation

## 2020-06-10 DIAGNOSIS — Z7984 Long term (current) use of oral hypoglycemic drugs: Secondary | ICD-10-CM | POA: Insufficient documentation

## 2020-06-10 DIAGNOSIS — M542 Cervicalgia: Secondary | ICD-10-CM | POA: Diagnosis present

## 2020-06-10 MED ORDER — HYDROMORPHONE HCL 2 MG/ML IJ SOLN
2.0000 mg | Freq: Once | INTRAMUSCULAR | Status: AC
  Administered 2020-06-10: 2 mg via INTRAMUSCULAR
  Filled 2020-06-10: qty 1

## 2020-06-10 MED ORDER — ONDANSETRON HCL 4 MG PO TABS
8.0000 mg | ORAL_TABLET | Freq: Once | ORAL | Status: AC
  Administered 2020-06-10: 8 mg via ORAL
  Filled 2020-06-10: qty 2

## 2020-06-10 MED ORDER — OXYCODONE-ACETAMINOPHEN 5-325 MG PO TABS: 1.0000 | ORAL_TABLET | Freq: Four times a day (QID) | ORAL | 0 refills | Status: DC | PRN

## 2020-06-10 MED ORDER — PREDNISONE 10 MG (21) PO TBPK: ORAL_TABLET | Freq: Every day | ORAL | 0 refills | Status: DC

## 2020-06-10 MED ORDER — KETOROLAC TROMETHAMINE 60 MG/2ML IM SOLN
60.0000 mg | Freq: Once | INTRAMUSCULAR | Status: AC
  Administered 2020-06-10: 60 mg via INTRAMUSCULAR
  Filled 2020-06-10: qty 2

## 2020-06-10 MED ORDER — DIAZEPAM 5 MG PO TABS
5.0000 mg | ORAL_TABLET | Freq: Once | ORAL | Status: AC
  Administered 2020-06-10: 5 mg via ORAL
  Filled 2020-06-10: qty 1

## 2020-06-10 MED ORDER — PREDNISONE 20 MG PO TABS
60.0000 mg | ORAL_TABLET | Freq: Once | ORAL | Status: AC
  Administered 2020-06-10: 60 mg via ORAL
  Filled 2020-06-10: qty 3

## 2020-06-10 NOTE — Discharge Instructions (Signed)
Begin taking prednisone as prescribed.  Begin taking Percocet as prescribed as needed for pain.  Follow-up with your primary doctor if symptoms are not improving in the next week, and return to the ER if symptoms significantly worsen or change.

## 2020-06-10 NOTE — ED Triage Notes (Signed)
Pt c/o R sided neck pain x 1 wk getting significantly worse yesterday. Has tried OTC meds and stretching at home without improvement. Denies injury

## 2020-06-10 NOTE — ED Provider Notes (Signed)
[facility]-EMERGENCY DEPT Provider Note   CSN: [id number] Arrival date & time: 06/10/20  0117     History Chief Complaint  Patient presents with  . Neck Pain    [name] is a 70 y.o. female.  Patient is a 70 year old female with history of coronary artery disease, diabetes, hypertension, and previous cervical disc herniation.  Patient presents today with complaints of neck pain.  She describes severe pain to the right side of her neck that started yesterday.  The pain radiates down her right arm.  She describes some numbness, but no weakness.  She denies any injury or trauma.  Pain is worse when she attempts to turn her head or move her shoulder or arm.  The history is provided by the patient.  Neck Pain Pain location:  R side Quality:  Stabbing Pain radiates to:  R arm Pain severity:  Severe Onset quality:  Sudden Duration:  1 day Timing:  Constant Progression:  Worsening Chronicity:  New Relieved by:  Nothing Worsened by:  Position Ineffective treatments:  None tried      Past Medical History:  Diagnosis Date  . CAD (coronary artery disease)   . Complication of anesthesia    trouble waking up once  . Diabetes mellitus without complication (HCC)   . Hypertension   . Hypothyroidism   . Liver disease    hx tumor in liver, ? fatty liver  . Sarcoidosis   . Sarcoidosis of lung (HCC)   . Sarcoidosis of lymph nodes     Patient Active Problem List   Diagnosis Date Noted  . Dehydration 06/22/2012  . AKI (acute kidney injury) (HCC) 06/22/2012  . Unspecified hypothyroidism 06/22/2012  . HTN (hypertension) 06/22/2012  . Type II or unspecified type diabetes mellitus without mention of complication, uncontrolled 06/22/2012  . Sarcoidosis 06/22/2012  . Heart murmur 06/22/2012  . UTI (urinary tract infection) 06/22/2012    Past Surgical History:  Procedure Laterality Date  . ABDOMINAL HYSTERECTOMY    . ABDOMINAL SURGERY    . CHOLECYSTECTOMY     . HERNIA REPAIR    . nissenfundiplication       OB History   No obstetric history on file.     Family History  Problem Relation Age of Onset  . Heart failure Father   . Hypertension Father   . Heart attack Father   . Heart attack Mother   . Breast cancer Mother 42  . Heart attack Brother   . Heart attack Brother     Social History   Tobacco Use  . Smoking status: Never Smoker  . Smokeless tobacco: Never Used  Vaping Use  . Vaping Use: Never used  Substance Use Topics  . Alcohol use: No  . Drug use: No    Home Medications Prior to Admission medications   Medication Sig Start Date End Date Taking? Authorizing Provider  baclofen (LIORESAL) 10 MG tablet Take 10 mg by mouth 3 (three) times daily as needed for pain. 11/02/17   [provider]  calcium-vitamin D (OSCAL WITH D) 500-200 MG-UNIT tablet Take 1 tablet by mouth daily with breakfast.    [provider]  furosemide (LASIX) 20 MG tablet Take 20 mg by mouth daily as needed for fluid or edema.  11/10/14   [provider]  levothyroxine (SYNTHROID, LEVOTHROID) 150 MCG tablet Take 75-150 mcg by mouth daily before breakfast. Pt alternates between 150 mcg and 75 mcg each day    [provider]  loratadine (CLARITIN) 10 MG tablet Take 10 mg by mouth daily as needed for allergies.     [provider]  metFORMIN (GLUCOPHAGE-XR) 500 MG 24 hr tablet Take 500 mg by mouth daily with breakfast.  12/21/14   [provider]  Olmesartan-amLODIPine-HCTZ 40-10-25 MG TABS Take 1 tablet by mouth daily.    [provider]  omeprazole (PRILOSEC) 40 MG capsule Take 40 mg by mouth daily.    [provider]  zolpidem (AMBIEN) 10 MG tablet Take 10 mg by mouth at bedtime as needed for sleep.    [provider]    Allergies    Sulfa antibiotics  Review of Systems   Review of Systems  Musculoskeletal: Positive for neck pain.  All other systems reviewed and are  negative.   Physical Exam Updated Vital Signs BP (!) 168/52   Pulse 84   Temp 98.5 F (36.9 C) (Oral)   Resp (!) 21   Ht 5\' 2"  (1.575 m)   Wt 79.4 kg   SpO2 98%   BMI 32.01 kg/m   Physical Exam Vitals and nursing note reviewed.  Constitutional:      General: She is not in acute distress.    Appearance: Normal appearance. She is not ill-appearing.  HENT:     Head: Normocephalic and atraumatic.  Neck:     Comments: There is tenderness to palpation in the soft tissues of the right lateral cervical region.  There is no bony tenderness. Pulmonary:     Effort: Pulmonary effort is normal.     Breath sounds: Normal breath sounds.  Skin:    General: Skin is warm and dry.  Neurological:     Mental Status: She is alert.     Comments: Strength is 5 out of 5 in both upper extremities.  Ulnar and radial pulses are easily palpable and motor and sensation are intact throughout the entire hand.     ED Results / Procedures / Treatments   Labs (all labs ordered are listed, but only abnormal results are displayed) Labs Reviewed - No data to display  EKG None  Radiology No results found.  Procedures Procedures   Medications Ordered in ED Medications  predniSONE (DELTASONE) tablet 60 mg (has no administration in time range)  HYDROmorphone (DILAUDID) injection 2 mg (has no administration in time range)    ED Course  I have reviewed the triage vital signs and the nursing notes.  Pertinent labs & imaging results that were available during my care of the patient were reviewed by me and considered in my medical decision making (see chart for details).    MDM Rules/Calculators/A&P  Patient presenting here with complaints of neck pain that is radicular in nature extending down her right arm.  She has strength and sensation intact.  She is feeling somewhat better after receiving prednisone, Toradol, Dilaudid, and Valium.  I see no indication for emergent imaging at this time.   Patient will be treated with a prednisone taper, pain medication, and is to follow-up with primary doctor if not improving.  Final Clinical Impression(s) / ED Diagnoses Final diagnoses:  None    Rx / DC Orders ED Discharge Orders    None       , MD 06/10/20 [phone]

## 2020-07-16 ENCOUNTER — Other Ambulatory Visit: Payer: Self-pay | Admitting: Rehabilitation

## 2020-07-16 DIAGNOSIS — M4312 Spondylolisthesis, cervical region: Secondary | ICD-10-CM

## 2020-07-16 DIAGNOSIS — M542 Cervicalgia: Secondary | ICD-10-CM

## 2020-07-23 ENCOUNTER — Other Ambulatory Visit: Payer: Self-pay

## 2020-07-23 ENCOUNTER — Ambulatory Visit
Admission: RE | Admit: 2020-07-23 | Discharge: 2020-07-23 | Disposition: A | Discharge status: Home / Self Care | Payer: Medicare Other | Source: Ambulatory Visit | Attending: Rehabilitation | Admitting: Rehabilitation

## 2020-07-23 DIAGNOSIS — M4312 Spondylolisthesis, cervical region: Secondary | ICD-10-CM

## 2020-07-23 DIAGNOSIS — M542 Cervicalgia: Secondary | ICD-10-CM

## 2020-07-24 ENCOUNTER — Other Ambulatory Visit: Payer: Medicare Other

## 2020-09-22 ENCOUNTER — Emergency Department (HOSPITAL_COMMUNITY): Payer: Medicare Other

## 2020-09-22 ENCOUNTER — Encounter (HOSPITAL_COMMUNITY): Payer: Self-pay

## 2020-09-22 ENCOUNTER — Inpatient Hospital Stay (HOSPITAL_COMMUNITY)
Admission: EM | Admit: 2020-09-22 | Discharge: 2020-09-25 | DRG: 286 | Disposition: A | Discharge status: Home / Self Care | Payer: Medicare Other | Attending: Internal Medicine | Admitting: Internal Medicine

## 2020-09-22 ENCOUNTER — Other Ambulatory Visit: Payer: Self-pay

## 2020-09-22 DIAGNOSIS — E039 Hypothyroidism, unspecified: Secondary | ICD-10-CM | POA: Diagnosis present

## 2020-09-22 DIAGNOSIS — E1165 Type 2 diabetes mellitus with hyperglycemia: Secondary | ICD-10-CM | POA: Diagnosis present

## 2020-09-22 DIAGNOSIS — D696 Thrombocytopenia, unspecified: Secondary | ICD-10-CM | POA: Diagnosis present

## 2020-09-22 DIAGNOSIS — I25110 Atherosclerotic heart disease of native coronary artery with unstable angina pectoris: Secondary | ICD-10-CM | POA: Diagnosis not present

## 2020-09-22 DIAGNOSIS — Z28310 Unvaccinated for covid-19: Secondary | ICD-10-CM

## 2020-09-22 DIAGNOSIS — M5412 Radiculopathy, cervical region: Secondary | ICD-10-CM | POA: Diagnosis present

## 2020-09-22 DIAGNOSIS — Z7984 Long term (current) use of oral hypoglycemic drugs: Secondary | ICD-10-CM

## 2020-09-22 DIAGNOSIS — I493 Ventricular premature depolarization: Secondary | ICD-10-CM | POA: Diagnosis present

## 2020-09-22 DIAGNOSIS — I700 Atherosclerosis of aorta: Secondary | ICD-10-CM | POA: Diagnosis present

## 2020-09-22 DIAGNOSIS — I200 Unstable angina: Secondary | ICD-10-CM

## 2020-09-22 DIAGNOSIS — R008 Other abnormalities of heart beat: Secondary | ICD-10-CM | POA: Diagnosis present

## 2020-09-22 DIAGNOSIS — D862 Sarcoidosis of lung with sarcoidosis of lymph nodes: Secondary | ICD-10-CM | POA: Diagnosis present

## 2020-09-22 DIAGNOSIS — Z79899 Other long term (current) drug therapy: Secondary | ICD-10-CM

## 2020-09-22 DIAGNOSIS — R079 Chest pain, unspecified: Secondary | ICD-10-CM | POA: Diagnosis present

## 2020-09-22 DIAGNOSIS — I5033 Acute on chronic diastolic (congestive) heart failure: Secondary | ICD-10-CM | POA: Diagnosis present

## 2020-09-22 DIAGNOSIS — R0789 Other chest pain: Secondary | ICD-10-CM | POA: Diagnosis not present

## 2020-09-22 DIAGNOSIS — I10 Essential (primary) hypertension: Secondary | ICD-10-CM | POA: Diagnosis present

## 2020-09-22 DIAGNOSIS — Z8249 Family history of ischemic heart disease and other diseases of the circulatory system: Secondary | ICD-10-CM

## 2020-09-22 DIAGNOSIS — I160 Hypertensive urgency: Secondary | ICD-10-CM | POA: Diagnosis present

## 2020-09-22 DIAGNOSIS — Z20822 Contact with and (suspected) exposure to covid-19: Secondary | ICD-10-CM | POA: Diagnosis present

## 2020-09-22 DIAGNOSIS — Z79890 Hormone replacement therapy (postmenopausal): Secondary | ICD-10-CM

## 2020-09-22 DIAGNOSIS — I110 Hypertensive heart disease with heart failure: Secondary | ICD-10-CM | POA: Diagnosis present

## 2020-09-22 DIAGNOSIS — E785 Hyperlipidemia, unspecified: Secondary | ICD-10-CM | POA: Diagnosis present

## 2020-09-22 DIAGNOSIS — Z90710 Acquired absence of both cervix and uterus: Secondary | ICD-10-CM

## 2020-09-22 DIAGNOSIS — Z882 Allergy status to sulfonamides status: Secondary | ICD-10-CM

## 2020-09-22 LAB — BASIC METABOLIC PANEL
Anion gap: 6 (ref 5–15)
BUN: 16 mg/dL (ref 8–23)
CO2: 25 mmol/L (ref 22–32)
Calcium: 9.6 mg/dL (ref 8.9–10.3)
Chloride: 104 mmol/L (ref 98–111)
Creatinine, Ser: 0.8 mg/dL (ref 0.44–1.00)
GFR, Estimated: >60 mL/min (ref >60)
Glucose, Bld: 376 mg/dL — ABNORMAL HIGH (ref 70–99)
Potassium: 3.9 mmol/L (ref 3.5–5.1)
Sodium: 135 mmol/L (ref 135–145)

## 2020-09-22 LAB — CBC
HCT: 38.6 % (ref 36.0–46.0)
Hemoglobin: 13 g/dL (ref 12.0–15.0)
MCH: 29.4 pg (ref 26.0–34.0)
MCHC: 33.7 g/dL (ref 30.0–36.0)
MCV: 87.3 fL (ref 80.0–100.0)
Platelets: 116 10*3/uL — ABNORMAL LOW (ref 150–400)
RBC: 4.42 MIL/uL (ref 3.87–5.11)
RDW: 12.7 % (ref 11.5–15.5)
WBC: 4.8 10*3/uL (ref 4.0–10.5)
nRBC: 0 % (ref 0.0–0.2)

## 2020-09-22 LAB — TROPONIN I (HIGH SENSITIVITY): Troponin I (High Sensitivity): 17 ng/L (ref <18)

## 2020-09-22 MED ORDER — ASPIRIN 81 MG PO CHEW
324.0000 mg | CHEWABLE_TABLET | Freq: Once | ORAL | Status: AC
  Administered 2020-09-22: 324 mg via ORAL
  Filled 2020-09-22: qty 4

## 2020-09-22 MED ORDER — NITROGLYCERIN 0.4 MG SL SUBL
0.4000 mg | SUBLINGUAL_TABLET | SUBLINGUAL | Status: DC | PRN
Start: 2020-09-22 — End: 2020-09-25
  Administered 2020-09-22 (×2): 0.4 mg via SUBLINGUAL
  Filled 2020-09-22: qty 1

## 2020-09-22 NOTE — ED Triage Notes (Signed)
Pt reports a "dull achy" sternal chest pain that began around 1830 tonight with no activity. Took maalox with no improvement.

## 2020-09-22 NOTE — ED Provider Notes (Signed)
[facility]-EMERGENCY DEPT Provider Note: [name] Dell, MD, FACEP  CSN: [id number] MRN: [id number] ARRIVAL: 09/22/20 at 2156 ROOM: WA18/WA18   CHIEF COMPLAINT  Chest Pain   HISTORY OF PRESENT ILLNESS  09/22/20 11:00 PM [name] is a 70 y.o. female who have the onset of chest discomfort beginning about 6:30 PM.  The onset was gradual.  She describes the pain as a pressure or tightness in the substernal region but it is also radiating down her left arm to her left hand.  She rates it as an 8 out of 10 at its worst, 5 out of 10 currently.  Nothing makes it better or worse except some partial improvement when leaning forward.  There is no change with deep breathing.  She took Maalox, Pepto-Bismol, Tylenol and ibuprofen without relief.  She has had no shortness of breath, diaphoresis or nausea.  She has had no cold symptoms such as nasal congestion, cough or sore throat.  She has had no vomiting or diarrhea.  She has had edema of her lower legs since this afternoon which is not typical for her.  She was noted to be tachycardic on arrival but she states that she is always tachycardic when she is in pain.   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 fatty liver   Sarcoidosis    Sarcoidosis of lung (HCC)    Sarcoidosis of lymph nodes     Past Surgical History:  Procedure Laterality Date   ABDOMINAL HYSTERECTOMY     ABDOMINAL SURGERY     CHOLECYSTECTOMY     HERNIA REPAIR     nissenfundiplication      Family History  Problem Relation Age of Onset   Heart failure Father    Hypertension Father    Heart attack Father    Heart attack Mother    Breast cancer Mother 43   Heart attack Brother    Heart attack Brother     Social History   Tobacco Use   Smoking status: Never    Smokeless tobacco: Never  Vaping Use   Vaping Use: Never used  Substance Use Topics   Alcohol use: No   Drug use: No    Prior to Admission medications   Medication Sig Start Date End Date Taking? Authorizing Provider  baclofen (LIORESAL) 10 MG tablet Take 10 mg by mouth 3 (three) times daily as needed for pain. 11/02/17   [provider]  calcium-vitamin D (OSCAL WITH D) 500-200 MG-UNIT tablet Take 1 tablet by mouth daily with breakfast.    [provider]  furosemide (LASIX) 20 MG tablet Take 20 mg by mouth daily as needed for fluid or edema.  11/10/14   [provider]  levothyroxine (SYNTHROID, LEVOTHROID) 150 MCG tablet Take 75-150 mcg by mouth daily before breakfast. Pt alternates between 150 mcg and 75 mcg each day    [provider]  loratadine (CLARITIN) 10 MG tablet Take 10 mg by mouth daily as needed for allergies.     [provider]  metFORMIN (GLUCOPHAGE-XR) 500 MG 24 hr tablet Take 500 mg by mouth daily with breakfast.  12/21/14   [provider]  Olmesartan-amLODIPine-HCTZ 40-10-25 MG TABS Take 1 tablet by mouth daily.  [provider]  omeprazole (PRILOSEC) 40 MG capsule Take 40 mg by mouth daily.    [provider]  oxyCODONE-acetaminophen (PERCOCET) 5-325 MG tablet Take 1-2 tablets by mouth every 6 (six) hours as needed. 06/10/20   Geoffery Lyons, MD  predniSONE (STERAPRED UNI-PAK 21 TAB) 10 MG (21) TBPK tablet Take by mouth daily. Take 6 tabs by mouth daily  for 2 days, then 5 tabs for 2 days, then 4 tabs for 2 days, then 3 tabs for 2 days, 2 tabs for 2 days, then 1 tab by mouth daily for 2 days 06/10/20   Geoffery Lyons, MD  zolpidem (AMBIEN) 10 MG tablet Take 10 mg by mouth at bedtime as needed for sleep.    [provider]    Allergies Sulfa antibiotics   REVIEW OF SYSTEMS  Negative except as noted here or in the History of Present Illness.   PHYSICAL EXAMINATION  Initial Vital  Signs Blood pressure (!) 192/97, pulse (!) 117, temperature 98.4 F (36.9 C), resp. rate 20, height 5\' 2"  (1.575 m), weight 81.6 kg, SpO2 99 %.  Examination General: Well-developed, well-nourished female in no acute distress; appearance consistent with age of record HENT: normocephalic; atraumatic Eyes: pupils equal, round and reactive to light; extraocular muscles intact Neck: supple Heart: regular rate and rhythm; tachycardia Lungs: clear to auscultation bilaterally Abdomen: soft; nondistended; nontender; bowel sounds present Extremities: No deformity; full range of motion; +1 edema of lower legs Neurologic: Awake, alert and oriented; motor function intact in all extremities and symmetric; no facial droop Skin: Warm and dry Psychiatric: Normal mood and affect   RESULTS  Summary of this visit's results, reviewed and interpreted by myself:   EKG Interpretation  Date/Time:  Monday September 22 2020 22:06:41 EDT Ventricular Rate:  113 PR Interval:  172 QRS Duration: 92 QT Interval:  337 QTC Calculation: 462 R Axis:   0 Text Interpretation: Sinus tachycardia Borderline repolarization abnormality Since last tracing Premature ventricular complexes are no longer present Confirmed by [id number] [phone]) on 09/22/2020 10:16:10 PM       Laboratory Studies: Results for orders placed or performed during the hospital encounter of 09/22/20 (from the past 24 hour(s))  Basic metabolic panel     Status: Abnormal   Collection Time: 09/22/20 10:04 PM  Result Value Ref Range   Sodium 135 135 - 145 mmol/L   Potassium 3.9 3.5 - 5.1 mmol/L   Chloride 104 98 - 111 mmol/L   CO2 25 22 - 32 mmol/L   Glucose, Bld 376 (H) 70 - 99 mg/dL   BUN 16 8 - 23 mg/dL   Creatinine, Ser 11/22/20 0.44 - 1.00 mg/dL   Calcium 9.6 8.9 - 6.04 mg/dL   GFR, Estimated 54.0 >98 mL/min   Anion gap 6 5 - 15  CBC     Status: Abnormal   Collection Time: 09/22/20 10:04 PM  Result Value Ref Range   WBC 4.8 4.0 - 10.5  K/uL   RBC 4.42 3.87 - 5.11 MIL/uL   Hemoglobin 13.0 12.0 - 15.0 g/dL   HCT 11/22/20 91.4 - 78.2 %   MCV 87.3 80.0 - 100.0 fL   MCH 29.4 26.0 - 34.0 pg   MCHC 33.7 30.0 - 36.0 g/dL   RDW 95.6 21.3 - 08.6 %   Platelets 116 (L) 150 - 400 K/uL   nRBC 0.0 0.0 - 0.2 %  Troponin I (High Sensitivity)     Status: None   Collection Time: 09/22/20 10:04  PM  Result Value Ref Range   Troponin I (High Sensitivity) 17 <18 ng/L  D-dimer, quantitative     Status: None   Collection Time: 09/22/20 11:22 PM  Result Value Ref Range   D-Dimer, Quant 0.40 0.00 - 0.50 ug/mL-FEU  Brain natriuretic peptide     Status: Abnormal   Collection Time: 09/22/20 11:22 PM  Result Value Ref Range   B Natriuretic Peptide 179.2 (H) 0.0 - 100.0 pg/mL  Troponin I (High Sensitivity)     Status: Abnormal   Collection Time: 09/22/20 11:22 PM  Result Value Ref Range   Troponin I (High Sensitivity) 34 (H) <18 ng/L   Imaging Studies: DG Chest 2 View  Result Date: 09/22/2020 CLINICAL DATA:  Chest pain EXAM: CHEST - 2 VIEW COMPARISON:  Chest x-ray 02/07/2018 FINDINGS: The cardiomediastinal silhouette appears within normal limits. There is central interstitial prominence bilaterally. There also interstitial and patchy opacities in the left lung base. There is no pleural effusion or pneumothorax identified. No acute fractures are seen. There are surgical clips in the right abdomen. IMPRESSION: 1. Patchy and interstitial opacities in the left lung base worrisome for infection. Follow-up imaging recommended in 2 months or 2 menstrual cycles to confirm resolution. 2. Central interstitial prominence may be related to mild edema or infection/inflammation. Electronically Signed   By: Darliss Cheney M.D.   On: 09/22/2020 22:18    ED COURSE and MDM  Nursing notes, initial and subsequent vitals signs, including pulse oximetry, reviewed and interpreted by myself.  Vitals:   09/22/20 2211 09/22/20 2309 09/22/20 2337 09/22/20 2357  BP:  (!)  210/82 (!) 181/63 (!) 189/88  Pulse:  (!) 120 (!) 106 (!) 106  Resp:  (!) 22 (!) 22 (!) 22  Temp:      SpO2:  99% 97% 96%  Weight: 81.6 kg     Height: 5\' 2"  (1.575 m)      Medications  nitroGLYCERIN (NITROSTAT) SL tablet 0.4 mg (0.4 mg Sublingual Given 09/22/20 2359)  aspirin chewable tablet 324 mg (324 mg Oral Given 09/22/20 2335)   12:33 AM Pain-free after sublingual nitroglycerin.  Patient given 324 mg of aspirin.  12:48 AM Patient's second troponin has risen although still within the normal range.  I am concerned that this represents unstable angina.  Heparin per pharmacy consult has been ordered.  We will have the patient admitted to the hospitalist service.  1:26 AM Dr. 2336 at bedside.  PROCEDURES  Procedures CRITICAL CARE Performed by: Antionette Char [place] Total critical care time: 30 minutes Critical care time was exclusive of separately billable procedures and treating other patients. Critical care was necessary to treat or prevent imminent or life-threatening deterioration. Critical care was time spent personally by me on the following activities: development of treatment plan with patient and/or surrogate as well as nursing, discussions with consultants, evaluation of patient's response to treatment, examination of patient, obtaining history from patient or surrogate, ordering and performing treatments and interventions, ordering and review of laboratory studies, ordering and review of radiographic studies, pulse oximetry and re-evaluation of patient's condition.   ED DIAGNOSES     ICD-10-CM   1. Unstable angina (HCC)  I20.0          Damesha Lawler, MD 09/23/20 0126

## 2020-09-23 ENCOUNTER — Encounter (HOSPITAL_COMMUNITY): Payer: Self-pay | Admitting: Family Medicine

## 2020-09-23 ENCOUNTER — Observation Stay (HOSPITAL_COMMUNITY): Payer: Medicare Other

## 2020-09-23 ENCOUNTER — Encounter (HOSPITAL_COMMUNITY)
Admission: EM | Disposition: A | Discharge status: Home / Self Care | Payer: Self-pay | Source: Home / Self Care | Attending: Internal Medicine

## 2020-09-23 DIAGNOSIS — R079 Chest pain, unspecified: Secondary | ICD-10-CM | POA: Diagnosis not present

## 2020-09-23 DIAGNOSIS — I214 Non-ST elevation (NSTEMI) myocardial infarction: Secondary | ICD-10-CM

## 2020-09-23 DIAGNOSIS — E039 Hypothyroidism, unspecified: Secondary | ICD-10-CM | POA: Diagnosis not present

## 2020-09-23 DIAGNOSIS — I10 Essential (primary) hypertension: Secondary | ICD-10-CM

## 2020-09-23 DIAGNOSIS — D696 Thrombocytopenia, unspecified: Secondary | ICD-10-CM

## 2020-09-23 DIAGNOSIS — E1165 Type 2 diabetes mellitus with hyperglycemia: Secondary | ICD-10-CM | POA: Diagnosis not present

## 2020-09-23 HISTORY — PX: LEFT HEART CATH AND CORONARY ANGIOGRAPHY: CATH118249

## 2020-09-23 LAB — TROPONIN I (HIGH SENSITIVITY)
Troponin I (High Sensitivity): 34 ng/L — ABNORMAL HIGH (ref <18)
Troponin I (High Sensitivity): 151 ng/L (ref <18)
Troponin I (High Sensitivity): 196 ng/L (ref <18)
Troponin I (High Sensitivity): 374 ng/L (ref <18)

## 2020-09-23 LAB — ECHOCARDIOGRAM COMPLETE
Area-P 1/2: 3.27 cm2
Calc EF: 71.9 %
Height: 62 in
MV M vel: 5.88 m/s
MV Peak grad: 138.3 mmHg
Radius: 0.4 cm
S' Lateral: 2.3 cm
Single Plane A2C EF: 62.6 %
Single Plane A4C EF: 76.9 %
Weight: 2880 oz

## 2020-09-23 LAB — HEPARIN LEVEL (UNFRACTIONATED)
Heparin Unfractionated: <0.1 IU/mL — ABNORMAL LOW (ref 0.30–0.70)
Heparin Unfractionated: <0.1 IU/mL — ABNORMAL LOW (ref 0.30–0.70)

## 2020-09-23 LAB — APTT: aPTT: 29 seconds (ref 24–36)

## 2020-09-23 LAB — LIPID PANEL
Cholesterol: 116 mg/dL (ref 0–200)
HDL: 48 mg/dL (ref >40)
LDL Cholesterol: 63 mg/dL (ref 0–99)
Total CHOL/HDL Ratio: 2.4 RATIO
Triglycerides: 24 mg/dL (ref <150)
VLDL: 5 mg/dL (ref 0–40)

## 2020-09-23 LAB — PROTIME-INR
INR: 1 (ref 0.8–1.2)
Prothrombin Time: 13.4 seconds (ref 11.4–15.2)

## 2020-09-23 LAB — RESP PANEL BY RT-PCR (FLU A&B, COVID) ARPGX2
Influenza A by PCR: NEGATIVE
Influenza B by PCR: NEGATIVE
SARS Coronavirus 2 by RT PCR: NEGATIVE

## 2020-09-23 LAB — HEMOGLOBIN A1C
Hgb A1c MFr Bld: 5.8 % — ABNORMAL HIGH (ref 4.8–5.6)
Mean Plasma Glucose: 119.76 mg/dL

## 2020-09-23 LAB — POCT ACTIVATED CLOTTING TIME: Activated Clotting Time: 184 seconds

## 2020-09-23 LAB — GLUCOSE, CAPILLARY
Glucose-Capillary: 196 mg/dL — ABNORMAL HIGH (ref 70–99)
Glucose-Capillary: 55 mg/dL — ABNORMAL LOW (ref 70–99)
Glucose-Capillary: 109 mg/dL — ABNORMAL HIGH (ref 70–99)
Glucose-Capillary: 101 mg/dL — ABNORMAL HIGH (ref 70–99)
Glucose-Capillary: 111 mg/dL — ABNORMAL HIGH (ref 70–99)
Glucose-Capillary: 109 mg/dL — ABNORMAL HIGH (ref 70–99)

## 2020-09-23 LAB — D-DIMER, QUANTITATIVE: D-Dimer, Quant: 0.4 ug/mL-FEU (ref 0.00–0.50)

## 2020-09-23 LAB — BRAIN NATRIURETIC PEPTIDE: B Natriuretic Peptide: 179.2 pg/mL — ABNORMAL HIGH (ref 0.0–100.0)

## 2020-09-23 SURGERY — LEFT HEART CATH AND CORONARY ANGIOGRAPHY
Anesthesia: LOCAL

## 2020-09-23 MED ORDER — SODIUM CHLORIDE 0.9% FLUSH
3.0000 mL | Freq: Two times a day (BID) | INTRAVENOUS | Status: DC
  Administered 2020-09-23 – 2020-09-25 (×4): 3 mL via INTRAVENOUS

## 2020-09-23 MED ORDER — VERAPAMIL HCL 2.5 MG/ML IV SOLN
INTRAVENOUS | Status: AC
  Filled 2020-09-23: qty 2

## 2020-09-23 MED ORDER — FUROSEMIDE 20 MG PO TABS
20.0000 mg | ORAL_TABLET | Freq: Every day | ORAL | Status: DC
  Filled 2020-09-23: qty 1

## 2020-09-23 MED ORDER — OLMESARTAN-AMLODIPINE-HCTZ 40-10-25 MG PO TABS: 1.0000 | ORAL_TABLET | Freq: Every day | ORAL | Status: DC

## 2020-09-23 MED ORDER — SODIUM CHLORIDE 0.9 % IV SOLN: 250.0000 mL | INTRAVENOUS | Status: DC | PRN

## 2020-09-23 MED ORDER — MIDAZOLAM HCL 2 MG/2ML IJ SOLN
INTRAMUSCULAR | Status: AC
  Filled 2020-09-23: qty 2

## 2020-09-23 MED ORDER — GABAPENTIN 300 MG PO CAPS
300.0000 mg | ORAL_CAPSULE | Freq: Three times a day (TID) | ORAL | Status: DC
  Administered 2020-09-23 – 2020-09-25 (×6): 300 mg via ORAL
  Filled 2020-09-23 (×6): qty 1

## 2020-09-23 MED ORDER — GABAPENTIN 300 MG PO CAPS: 300.0000 mg | ORAL_CAPSULE | Freq: Three times a day (TID) | ORAL | Status: DC

## 2020-09-23 MED ORDER — CARVEDILOL 3.125 MG PO TABS
3.1250 mg | ORAL_TABLET | Freq: Two times a day (BID) | ORAL | Status: DC
  Filled 2020-09-23: qty 1

## 2020-09-23 MED ORDER — HYDRALAZINE HCL 20 MG/ML IJ SOLN: 10.0000 mg | INTRAMUSCULAR | Status: AC | PRN

## 2020-09-23 MED ORDER — HEPARIN SODIUM (PORCINE) 1000 UNIT/ML IJ SOLN
INTRAMUSCULAR | Status: AC
  Filled 2020-09-23: qty 1

## 2020-09-23 MED ORDER — HYDRALAZINE HCL 25 MG PO TABS: 25.0000 mg | ORAL_TABLET | Freq: Three times a day (TID) | ORAL | Status: DC | PRN

## 2020-09-23 MED ORDER — HYDROCHLOROTHIAZIDE 25 MG PO TABS
25.0000 mg | ORAL_TABLET | Freq: Every day | ORAL | Status: DC
  Administered 2020-09-23: 25 mg via ORAL
  Filled 2020-09-23: qty 1

## 2020-09-23 MED ORDER — IOHEXOL 350 MG/ML SOLN
INTRAVENOUS | Status: DC | PRN
  Administered 2020-09-23: 90 mL

## 2020-09-23 MED ORDER — ZOLPIDEM TARTRATE 5 MG PO TABS
5.0000 mg | ORAL_TABLET | Freq: Every evening | ORAL | Status: DC | PRN
  Administered 2020-09-23 – 2020-09-24 (×2): 5 mg via ORAL
  Filled 2020-09-23 (×2): qty 1

## 2020-09-23 MED ORDER — LIDOCAINE HCL (PF) 1 % IJ SOLN
INTRAMUSCULAR | Status: AC
  Filled 2020-09-23: qty 30

## 2020-09-23 MED ORDER — ACETAMINOPHEN 325 MG PO TABS
ORAL_TABLET | ORAL | Status: AC
  Filled 2020-09-23: qty 2

## 2020-09-23 MED ORDER — ACETAMINOPHEN 325 MG PO TABS
650.0000 mg | ORAL_TABLET | ORAL | Status: DC | PRN
  Administered 2020-09-23 – 2020-09-24 (×2): 650 mg via ORAL
  Filled 2020-09-23: qty 2

## 2020-09-23 MED ORDER — SODIUM CHLORIDE 0.9 % WEIGHT BASED INFUSION
3.0000 mL/kg/h | INTRAVENOUS | Status: DC
  Administered 2020-09-23: 3 mL/kg/h via INTRAVENOUS

## 2020-09-23 MED ORDER — SODIUM CHLORIDE 0.9% FLUSH: 3.0000 mL | INTRAVENOUS | Status: DC | PRN

## 2020-09-23 MED ORDER — ONDANSETRON HCL 4 MG/2ML IJ SOLN: 4.0000 mg | Freq: Four times a day (QID) | INTRAMUSCULAR | Status: DC | PRN

## 2020-09-23 MED ORDER — IRBESARTAN 75 MG PO TABS
75.0000 mg | ORAL_TABLET | Freq: Every day | ORAL | Status: DC
  Administered 2020-09-23 – 2020-09-25 (×3): 75 mg via ORAL
  Filled 2020-09-23 (×3): qty 1

## 2020-09-23 MED ORDER — ASPIRIN EC 81 MG PO TBEC
81.0000 mg | DELAYED_RELEASE_TABLET | Freq: Every day | ORAL | Status: DC
  Administered 2020-09-23: 81 mg via ORAL
  Filled 2020-09-23: qty 1

## 2020-09-23 MED ORDER — LIDOCAINE HCL (PF) 1 % IJ SOLN
INTRAMUSCULAR | Status: DC | PRN
  Administered 2020-09-23: 20 mL
  Administered 2020-09-23: 2 mL

## 2020-09-23 MED ORDER — VERAPAMIL HCL 2.5 MG/ML IV SOLN
INTRAVENOUS | Status: DC | PRN
  Administered 2020-09-23 (×2): 10 mL via INTRA_ARTERIAL

## 2020-09-23 MED ORDER — FENTANYL CITRATE (PF) 100 MCG/2ML IJ SOLN
INTRAMUSCULAR | Status: DC | PRN
  Administered 2020-09-23 (×2): 25 ug via INTRAVENOUS

## 2020-09-23 MED ORDER — HEPARIN (PORCINE) 25000 UT/250ML-% IV SOLN
1050.0000 [IU]/h | INTRAVENOUS | Status: DC
  Administered 2020-09-23: 800 [IU]/h via INTRAVENOUS
  Filled 2020-09-23: qty 250

## 2020-09-23 MED ORDER — ASPIRIN 81 MG PO CHEW
81.0000 mg | CHEWABLE_TABLET | Freq: Every day | ORAL | Status: DC
  Administered 2020-09-24 – 2020-09-25 (×2): 81 mg via ORAL
  Filled 2020-09-23 (×2): qty 1

## 2020-09-23 MED ORDER — HEPARIN (PORCINE) IN NACL 1000-0.9 UT/500ML-% IV SOLN
INTRAVENOUS | Status: AC
  Filled 2020-09-23: qty 500

## 2020-09-23 MED ORDER — AMLODIPINE BESYLATE 10 MG PO TABS
10.0000 mg | ORAL_TABLET | Freq: Every day | ORAL | Status: DC
  Administered 2020-09-23 – 2020-09-25 (×3): 10 mg via ORAL
  Filled 2020-09-23 (×3): qty 1

## 2020-09-23 MED ORDER — LEVOTHYROXINE SODIUM 75 MCG PO TABS
150.0000 ug | ORAL_TABLET | ORAL | Status: DC
  Administered 2020-09-23 – 2020-09-25 (×2): 150 ug via ORAL
  Filled 2020-09-23: qty 1
  Filled 2020-09-23: qty 2

## 2020-09-23 MED ORDER — VERAPAMIL HCL 2.5 MG/ML IV SOLN
INTRAVENOUS | Status: DC | PRN
  Administered 2020-09-23: 10 mL via INTRA_ARTERIAL

## 2020-09-23 MED ORDER — ROSUVASTATIN CALCIUM 20 MG PO TABS
20.0000 mg | ORAL_TABLET | Freq: Every day | ORAL | Status: DC
  Administered 2020-09-23 – 2020-09-25 (×3): 20 mg via ORAL
  Filled 2020-09-23 (×3): qty 1

## 2020-09-23 MED ORDER — HYDRALAZINE HCL 25 MG PO TABS
25.0000 mg | ORAL_TABLET | Freq: Two times a day (BID) | ORAL | Status: DC
  Filled 2020-09-23: qty 1

## 2020-09-23 MED ORDER — INSULIN ASPART 100 UNIT/ML IJ SOLN
0.0000 [IU] | INTRAMUSCULAR | Status: DC
  Administered 2020-09-23: 2 [IU] via SUBCUTANEOUS
  Filled 2020-09-23: qty 0.06

## 2020-09-23 MED ORDER — MIDAZOLAM HCL 2 MG/2ML IJ SOLN
INTRAMUSCULAR | Status: DC | PRN
  Administered 2020-09-23: 1 mg via INTRAVENOUS
  Administered 2020-09-23: 2 mg via INTRAVENOUS

## 2020-09-23 MED ORDER — HEPARIN (PORCINE) IN NACL 1000-0.9 UT/500ML-% IV SOLN
INTRAVENOUS | Status: DC | PRN
Start: 2020-09-23 — End: 2020-09-23
  Administered 2020-09-23 (×2): 500 mL

## 2020-09-23 MED ORDER — DEXTROSE 50 % IV SOLN
INTRAVENOUS | Status: AC
  Administered 2020-09-23: 25 mL
  Filled 2020-09-23: qty 50

## 2020-09-23 MED ORDER — SODIUM CHLORIDE 0.9 % IV SOLN: INTRAVENOUS | Status: AC

## 2020-09-23 MED ORDER — HEPARIN BOLUS VIA INFUSION
2000.0000 [IU] | Freq: Once | INTRAVENOUS | Status: AC
  Administered 2020-09-23: 2000 [IU] via INTRAVENOUS
  Filled 2020-09-23: qty 2000

## 2020-09-23 MED ORDER — LEVOTHYROXINE SODIUM 150 MCG PO TABS: 75.0000 ug | ORAL_TABLET | Freq: Every day | ORAL | Status: DC

## 2020-09-23 MED ORDER — LABETALOL HCL 5 MG/ML IV SOLN: 10.0000 mg | INTRAVENOUS | Status: AC | PRN

## 2020-09-23 MED ORDER — SODIUM CHLORIDE 0.9 % WEIGHT BASED INFUSION: 1.0000 mL/kg/h | INTRAVENOUS | Status: DC

## 2020-09-23 MED ORDER — ACETAMINOPHEN 325 MG PO TABS: 650.0000 mg | ORAL_TABLET | ORAL | Status: DC | PRN

## 2020-09-23 MED ORDER — HEPARIN BOLUS VIA INFUSION
4000.0000 [IU] | Freq: Once | INTRAVENOUS | Status: AC
  Administered 2020-09-23: 4000 [IU] via INTRAVENOUS
  Filled 2020-09-23: qty 4000

## 2020-09-23 MED ORDER — ATORVASTATIN CALCIUM 40 MG PO TABS
80.0000 mg | ORAL_TABLET | Freq: Every day | ORAL | Status: DC
  Filled 2020-09-23: qty 2

## 2020-09-23 MED ORDER — LEVOTHYROXINE SODIUM 75 MCG PO TABS
75.0000 ug | ORAL_TABLET | ORAL | Status: DC
  Administered 2020-09-24: 75 ug via ORAL
  Filled 2020-09-23: qty 1

## 2020-09-23 MED ORDER — HEPARIN SODIUM (PORCINE) 1000 UNIT/ML IJ SOLN
INTRAMUSCULAR | Status: DC | PRN
  Administered 2020-09-23: 4000 [IU] via INTRAVENOUS

## 2020-09-23 MED ORDER — FENTANYL CITRATE (PF) 100 MCG/2ML IJ SOLN
INTRAMUSCULAR | Status: AC
  Filled 2020-09-23: qty 2

## 2020-09-23 SURGICAL SUPPLY — 22 items
CATH INFINITI 5 FR JL3.5 (CATHETERS) ×1 IMPLANT
CATH INFINITI 5FR JL4 (CATHETERS) ×1 IMPLANT
CATH LAUNCHER 5F EBU3.5 (CATHETERS) ×1 IMPLANT
CATH LAUNCHER 5F JL3.5 (CATHETERS) IMPLANT
CATH LAUNCHER 5F RADL (CATHETERS) IMPLANT
CATH LAUNCHER 6FR EBU3.5 (CATHETERS) ×1 IMPLANT
CATH OPTITORQUE TIG 4.0 5F (CATHETERS) ×1 IMPLANT
CATHETER LAUNCHER 5F JL3.5 (CATHETERS) ×2
CATHETER LAUNCHER 5F RADL (CATHETERS) ×2
CLOSURE MYNX CONTROL 5F (Vascular Products) ×1 IMPLANT
DEVICE RAD COMP TR BAND LRG (VASCULAR PRODUCTS) ×1 IMPLANT
GLIDESHEATH SLEND SS 6F .021 (SHEATH) ×1 IMPLANT
GUIDEWIRE INQWIRE 1.5J.035X260 (WIRE) IMPLANT
INQWIRE 1.5J .035X260CM (WIRE) ×2
KIT HEART LEFT (KITS) ×2 IMPLANT
PACK CARDIAC CATHETERIZATION (CUSTOM PROCEDURE TRAY) ×2 IMPLANT
SHEATH PINNACLE 5F 10CM (SHEATH) ×1 IMPLANT
SHEATH PROBE COVER 6X72 (BAG) ×1 IMPLANT
TRANSDUCER W/STOPCOCK (MISCELLANEOUS) ×2 IMPLANT
TUBING CIL FLEX 10 FLL-RA (TUBING) ×2 IMPLANT
WIRE EMERALD 3MM-J .035X150CM (WIRE) ×1 IMPLANT
WIRE HI TORQ VERSACORE-J 145CM (WIRE) ×1 IMPLANT

## 2020-09-23 NOTE — Progress Notes (Signed)
Patient admitted to the hospital earlier this morning by Dr. Antionette Char  Patient seen and examined.  She denies any chest pain at this time.  No shortness of breath.  Labs reviewed and noted that she has had a rising troponin.  Most recent troponin noted to be 374.  She has not had any acute EKG changes.  Echocardiogram shows normal ejection fraction.  Assessment/plan  Chest pain. -Patient is noted to have elevated troponin -She does have significant risk factors for coronary disease -Seen by cardiology with plans for cardiac cath later today  Hypertension -Continue on olmesartan, amlodipine, hydrochlorothiazide and hydralazine -Blood pressure currently stable  Acute on chronic diastolic congestive heart failure -She did describe significant lower extremity edema, mild edema noted on chest x-ray -She had taken Lasix on 9/5 and reports that her overall swelling has improved -We will continue her on her daily Lasix  Diabetes -Blood sugars currently stable -Continue on sliding scale insulin -A1c 5.8  Hypothyroidism -Continue Synthroid  Cervical radiculopathy -Has a history of right-sided cervical radiculopathy for which she takes intermittent prednisone -Currently no weakness or numbness -Continue outpatient follow-up  Rectal bleeding -Reports having a single episode of rectal bleeding approximately a week ago -She has had similar issues in the past -She is already established with Dr. Loreta Ave -Hemoglobin is currently stable and she is not having any further bleeding -Recommend she follow-up with Dr. Clarnce Flock [facility]

## 2020-09-23 NOTE — Progress Notes (Signed)
Patient transferred from cath lab at 1856hrs.  Oriented to unit and reviewed post cath instructions.  Radial band to right wrist, +2 radial pulse.  Right femoral site with small ooze, area marked, +2pedal pulse.  Husband at bedside.

## 2020-09-23 NOTE — Progress Notes (Signed)
Patient had atorvastatin scheduled this AM, this nurse educated patient on medication. Patient informed nurse that she could not take atorvastatin due to having extreme muscle aches and cramping. Attending notified via secure chat, see orders.

## 2020-09-23 NOTE — Progress Notes (Signed)
ANTICOAGULATION CONSULT NOTE - follow up  Pharmacy Consult for heparin Indication: NSTEMI  Allergies  Allergen Reactions   Sulfa Antibiotics Other (See Comments)    High fever Other reaction(s): Fever   Atorvastatin Other (See Comments)    Extreme muscle cramps and muscle aches   Amoxicillin Rash    Patient Measurements: Height: 5\' 2"  (157.5 cm) Weight: 81.6 kg (180 lb) IBW/kg (Calculated) : 50.1 Heparin Dosing Weight: 68.3kg  Vital Signs: Temp: 98.7 F (37.1 C) (09/06 1023) Temp Source: Oral (09/06 0824) BP: 111/65 (09/06 1023) Pulse Rate: 80 (09/06 1023)  Labs: Recent Labs    09/22/20 2204 09/22/20 2322 09/23/20 0210 09/23/20 0328 09/23/20 0643 09/23/20 0941  HGB 13.0  --   --   --   --   --   HCT 38.6  --   --   --   --   --   PLT 116*  --   --   --   --   --   APTT  --  29  --   --   --   --   LABPROT  --  13.4  --   --   --   --   INR  --  1.0  --   --   --   --   HEPARINUNFRC  --   --   --   --   --  <0.10*  CREATININE 0.80  --   --   --   --   --   TROPONINIHS 17 34* 151* 196* 374*  --      Estimated Creatinine Clearance: 62.9 mL/min (by C-G formula based on SCr of 0.8 mg/dL).   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 fatty liver   Sarcoidosis    Sarcoidosis of lung (HCC)    Sarcoidosis of lymph nodes      Assessment:  70 y.o. female who have the onset of chest discomfort beginning about 6:30 PM.  The onset was gradual.  She describes the pain as a pressure or tightness in the substernal region but it is also radiating down her left arm to her left hand.  Pharmacy consulted to dose heparin for ACS/STEMI.  No prior AC noted Hgb 13, Plts 116, Scr 0.8, Trop 34   09/23/20 1st heparin level SUBtherapeutic CBC stable at  baseline Per RN, no issues of note and no reported bleeding  Goal of Therapy:  Heparin level 0.3-0.7 units/ml Monitor platelets by anticoagulation protocol: Yes   Plan:  Bolus 2000 units IV heparin x 1 then Increase IV heparin rate from 800 units/hr to 1050 units/hr Recheck heparin level 8 hours after rebolus and rate increase Daily CBC   11/23/20, PharmD, BCPS Secure Chat if ?s 09/23/2020 10:50 AM

## 2020-09-23 NOTE — Progress Notes (Addendum)
Patient's troponin resulted at 151, provider notified via secure chat.   Attending hospitalist also made aware of troponin level.

## 2020-09-23 NOTE — Progress Notes (Signed)
  Echocardiogram 2D Echocardiogram has been performed.  Janalyn Harder 09/23/2020, 2:14 PM

## 2020-09-23 NOTE — H&P (Signed)
History and Physical    [name] LTR:[id number] DOB: Nov 20, 1950 DOA: 09/22/2020  PCP: Ralene Ok, MD   Patient coming from: Home   Chief Complaint: Chest pain   HPI: AALIJAH [name] is a 70 y.o. female with medical history significant for hypertension, type 2 diabetes mellitus, hypothyroidism, cervical radiculopathy, and sarcoidosis diagnosed in 2010, now presenting to the emergency department for evaluation of chest pain.  Patient reports that her right-sided cervical radiculopathy was bothering her yesterday and she took a prednisone presented.  She also had new onset of bilateral foot swelling yesterday and took 20 mg of Lasix which she has rarely used previously.  She was otherwise in her usual state until roughly 6:30 PM when she developed cute onset of dull and aching chest pressure while at rest.  There was radiation to the left shoulder and arm associated with this.  Discomfort was constant and not relieved with gabapentin, Pepcid, acetaminophen, ibuprofen, or Maalox at home.  Discomfort became more intense despite all these medications at home, prompting her to seek evaluation in the ED.  She does not remember experiencing this sensation previously.  No leg pain and no cough, SOB, or fevers/chills.   She was started on hydralazine couple weeks ago for uncontrolled blood pressure.  She had a normal low risk nuclear stress test in 2020 and echocardiogram at that time with preserved EF and diastolic dysfunction.  She was diagnosed with sarcoidosis in 2010 after abnormal lymph nodes were noted incidentally but she never required any treatment for this.  ED Course: Upon arrival to the ED, patient is found to be afebrile, saturating well on room air, tachycardic in the low 100s, and white blood pressure as high as 210 systolic.  EKG features sinus tachycardia with rate 113 and borderline repolarization abnormality.  Chest x-ray with patchy and interstitial opacities at the left base concerning for  possible infection as well as central interstitial prominence which could reflect mild edema or infection.  Chemistry panel with glucose 3 and 76 and CBC notable for platelets 116,000.  D-dimer is normal.  BNP 179.  Troponin 17 and then 34.  Patient was treated with 324 mg of aspirin, 2 sublingual nitroglycerin, and IV heparin in the ED. Chest pain resolved with NTG. COVID screening test negative.   Review of Systems:  All other systems reviewed and apart from HPI, are negative.  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 fatty liver   Sarcoidosis    Sarcoidosis of lung (HCC)    Sarcoidosis of lymph nodes     Past Surgical History:  Procedure Laterality Date   ABDOMINAL HYSTERECTOMY     ABDOMINAL SURGERY     CHOLECYSTECTOMY     HERNIA REPAIR     nissenfundiplication      Social History:   reports that she has never smoked. She has never used smokeless tobacco. She reports that she does not drink alcohol and does not use drugs.  Allergies  Allergen Reactions   Sulfa Antibiotics Other (See Comments)    High fever Other reaction(s): Fever    Family History  Problem Relation Age of Onset   Heart failure Father    Hypertension Father    Heart attack  Father    Heart attack Mother    Breast cancer Mother 16   Heart attack Brother    Heart attack Brother      Prior to Admission medications   Medication Sig Start Date End Date Taking? Authorizing Provider  baclofen (LIORESAL) 10 MG tablet Take 10 mg by mouth 3 (three) times daily as needed for pain. 11/02/17   [provider]  calcium-vitamin D (OSCAL WITH D) 500-200 MG-UNIT tablet Take 1 tablet by mouth daily with breakfast.    [provider]  furosemide (LASIX) 20 MG tablet Take 20 mg by mouth daily  as needed for fluid or edema.  11/10/14   [provider]  levothyroxine (SYNTHROID, LEVOTHROID) 150 MCG tablet Take 75-150 mcg by mouth daily before breakfast. Pt alternates between 150 mcg and 75 mcg each day    [provider]  loratadine (CLARITIN) 10 MG tablet Take 10 mg by mouth daily as needed for allergies.     [provider]  metFORMIN (GLUCOPHAGE-XR) 500 MG 24 hr tablet Take 500 mg by mouth daily with breakfast.  12/21/14   [provider]  Olmesartan-amLODIPine-HCTZ 40-10-25 MG TABS Take 1 tablet by mouth daily.    [provider]  omeprazole (PRILOSEC) 40 MG capsule Take 40 mg by mouth daily.    [provider]  oxyCODONE-acetaminophen (PERCOCET) 5-325 MG tablet Take 1-2 tablets by mouth every 6 (six) hours as needed. 06/10/20   Geoffery Lyons, MD  predniSONE (STERAPRED UNI-PAK 21 TAB) 10 MG (21) TBPK tablet Take by mouth daily. Take 6 tabs by mouth daily  for 2 days, then 5 tabs for 2 days, then 4 tabs for 2 days, then 3 tabs for 2 days, 2 tabs for 2 days, then 1 tab by mouth daily for 2 days 06/10/20   Geoffery Lyons, MD  zolpidem (AMBIEN) 10 MG tablet Take 10 mg by mouth at bedtime as needed for sleep.    [provider]    Physical Exam: Vitals:   09/23/20 0030 09/23/20 0100 09/23/20 0133 09/23/20 0140  BP: (!) 162/64 (!) 175/66 (!) 181/67 (!) 154/98  Pulse: 97 99 97   Resp: 20 18 18    Temp:   97.9 F (36.6 C)   TempSrc:   Oral   SpO2: 95% 90% 98%   Weight:      Height:        Constitutional: NAD, calm  Eyes: PERTLA, lids and conjunctivae normal ENMT: Mucous membranes are moist. Posterior pharynx clear of any exudate or lesions.   Neck: supple, no masses  Respiratory: no wheezing, no crackles. No accessory muscle use.  Cardiovascular: S1 & S2 heard, regular rate and rhythm. Trace lower leg swelling bilaterally. Abdomen: No distension, no tenderness, soft. Bowel sounds active.  Musculoskeletal: no clubbing /  cyanosis. No joint deformity upper and lower extremities.   Skin: no significant rashes, lesions, ulcers. Warm, dry, well-perfused. Neurologic: CN 2-12 grossly intact. Sensation intact. Moving all extremities.   Psychiatric: Alert and oriented to person, place, and situation. Pleasant and cooperative.    Labs and Imaging on Admission: I have personally reviewed following labs and imaging studies  CBC: Recent Labs  Lab 09/22/20 2204  WBC 4.8  HGB 13.0  HCT 38.6  MCV 87.3  PLT 116*   Basic Metabolic Panel: Recent Labs  Lab 09/22/20 2204  NA 135  K 3.9  CL 104  CO2 25  GLUCOSE 376*  BUN 16  CREATININE 0.80  CALCIUM  9.6   GFR: Estimated Creatinine Clearance: 62.9 mL/min (by C-G formula based on SCr of 0.8 mg/dL). Liver Function Tests: No results for input(s): AST, ALT, ALKPHOS, BILITOT, PROT, ALBUMIN in the last 168 hours. No results for input(s): LIPASE, AMYLASE in the last 168 hours. No results for input(s): AMMONIA in the last 168 hours. Coagulation Profile: Recent Labs  Lab 09/22/20 2322  INR 1.0   Cardiac Enzymes: No results for input(s): CKTOTAL, CKMB, CKMBINDEX, TROPONINI in the last 168 hours. BNP (last 3 results) No results for input(s): PROBNP in the last 8760 hours. HbA1C: No results for input(s): HGBA1C in the last 72 hours. CBG: No results for input(s): GLUCAP in the last 168 hours. Lipid Profile: No results for input(s): CHOL, HDL, LDLCALC, TRIG, CHOLHDL, LDLDIRECT in the last 72 hours. Thyroid Function Tests: No results for input(s): TSH, T4TOTAL, FREET4, T3FREE, THYROIDAB in the last 72 hours. Anemia Panel: No results for input(s): VITAMINB12, FOLATE, FERRITIN, TIBC, IRON, RETICCTPCT in the last 72 hours. Urine analysis:    Component Value Date/Time   COLORURINE YELLOW 06/07/2015 0831   APPEARANCEUR CLEAR 06/07/2015 0831   LABSPEC 1.010 06/07/2015 0831   PHURINE 5.5 06/07/2015 0831   GLUCOSEU NEGATIVE 06/07/2015 0831   HGBUR NEGATIVE  06/07/2015 0831   BILIRUBINUR NEGATIVE 06/07/2015 0831   KETONESUR NEGATIVE 06/07/2015 0831   PROTEINUR NEGATIVE 06/07/2015 0831   UROBILINOGEN 0.2 06/22/2012 1703   NITRITE NEGATIVE 06/07/2015 0831   LEUKOCYTESUR NEGATIVE 06/07/2015 0831   Sepsis Labs: @LABRCNTIP (procalcitonin:4,lacticidven:4) ) Recent Results (from the past 240 hour(s))  Resp Panel by RT-PCR (Flu A&B, Covid) Nasopharyngeal Swab     Status: None   Collection Time: 09/23/20 12:45 AM   Specimen: Nasopharyngeal Swab; Nasopharyngeal(NP) swabs in vial transport medium  Result Value Ref Range Status   SARS Coronavirus 2 by RT PCR NEGATIVE NEGATIVE Final    Comment: (NOTE) SARS-CoV-2 target nucleic acids are NOT DETECTED.  The SARS-CoV-2 RNA is generally detectable in upper respiratory specimens during the acute phase of infection. The lowest concentration of SARS-CoV-2 viral copies this assay can detect is 138 copies/mL. A negative result does not preclude SARS-Cov-2 infection and should not be used as the sole basis for treatment or other patient management decisions. A negative result may occur with  improper specimen collection/handling, submission of specimen other than nasopharyngeal swab, presence of viral mutation(s) within the areas targeted by this assay, and inadequate number of viral copies(<138 copies/mL). A negative result must be combined with clinical observations, patient history, and epidemiological information. The expected result is Negative.  Fact Sheet for Patients:  11/23/20  Fact Sheet for Healthcare Providers:  BloggerCourse.com  This test is no t yet approved or cleared by the [place] FDA and  has been authorized for detection and/or diagnosis of SARS-CoV-2 by FDA under an Emergency Use Authorization (EUA). This EUA will remain  in effect (meaning this test can be used) for the duration of the COVID-19 declaration under  Section 564(b)(1) of the Act, 21 U.S.C.section 360bbb-3(b)(1), unless the authorization is terminated  or revoked sooner.       Influenza A by PCR NEGATIVE NEGATIVE Final   Influenza B by PCR NEGATIVE NEGATIVE Final    Comment: (NOTE) The Xpert Xpress SARS-CoV-2/FLU/RSV plus assay is intended as an aid in the diagnosis of influenza from Nasopharyngeal swab specimens and should not be used as a sole basis for treatment. Nasal washings and aspirates are unacceptable for Xpert Xpress SARS-CoV-2/FLU/RSV testing.  Fact Sheet for Patients:  BloggerCourse.comhttps://www.fda.gov/media/152166/download  Fact Sheet for Healthcare Providers: SeriousBroker.ithttps://www.fda.gov/media/152162/download  This test is not yet approved or cleared by the [place] FDA and has been authorized for detection and/or diagnosis of SARS-CoV-2 by FDA under an Emergency Use Authorization (EUA). This EUA will remain in effect (meaning this test can be used) for the duration of the COVID-19 declaration under Section 564(b)(1) of the Act, 21 U.S.C. section 360bbb-3(b)(1), unless the authorization is terminated or revoked.  [facility] at [facility], [address] [place], [place] [address]      Radiological Exams on Admission: DG Chest 2 View  Result Date: 09/22/2020 CLINICAL DATA:  Chest pain EXAM: CHEST - 2 VIEW COMPARISON:  Chest x-ray 02/07/2018 FINDINGS: The cardiomediastinal silhouette appears within normal limits. There is central interstitial prominence bilaterally. There also interstitial and patchy opacities in the left lung base. There is no pleural effusion or pneumothorax identified. No acute fractures are seen. There are surgical clips in the right abdomen. IMPRESSION: 1. Patchy and interstitial opacities in the left lung base worrisome for infection. Follow-up imaging recommended in 2 months or 2 menstrual cycles to confirm resolution. 2. Central interstitial prominence may be related to mild edema or  infection/inflammation. Electronically Signed   By: Darliss CheneyAmy  Guttmann M.D.   On: 09/22/2020 22:18    EKG: Independently reviewed. Sinus tachycardia, rate 113, borderline repolarization abnormality.   Assessment/Plan   1. Chest pain  - Presents with acute-onset chest pain radiating to left arm that resolved after 2 NTG in ED  - EKG with ST rate 113 and borderline repol abnormality  - HS troponin 17 then 34  - She was given 324 mg ASA and started on IV heparin in ED  - Continue cardiac monitoring, trend troponin, continue IV heparin for now, ASA, and consult cardiology   2. Hypertension  - SBP as high as 210 in ED, improved with NTG  - Continue olmesartan, amlodipine, HCTZ, and hydralazine    3. Type II DM  - Serum glucose 376 in ED, no recent A1c  - Check CBGs and use SSI for now    4. Acute on chronic diastolic CHF   - Patient took a dose of Lasix 9/5 for b/l foot swelling, has BNP 179 in ED, and question of mild edema on CXR  - EF was preserved on echo from February 2020  - She reports swelling resolved with one dose 20 mg Lasix taken 9/5, will continue 20 mg Lasix daily for now    5. Thrombocytopenia  - Platelets 116,000 on admission  - Has been intermittently low in past  - Monitor closely while on IV heparin    6. Hypothyroidism - Continue Synthroid    7. Cervical radiculopathy  - Pt took prednisone 9/5 for flare in her right-sided cervical radiculopathy  - No numbness or weakness and no fever/chills or unexpected wt loss, stable for outpatient follow-up    DVT prophylaxis: IV heparin  Code Status: Full  Level of Care: Level of care: Telemetry Family Communication: Husband updated at bedside  Disposition Plan:  Patient is from: home  Anticipated d/c is to: Home  Anticipated d/c date is: Possibly as early as 09/24/20 Patient currently: Pending repeat troponin and EKG, cardiac monitoring, likely cardiology consultation  Consults called: message sent to cardiology with  request for am consult  Admission status: Observation     Briscoe Deutscherimothy S [place], MD [facility]  09/23/2020, 1:48 AM

## 2020-09-23 NOTE — Progress Notes (Signed)
ANTICOAGULATION CONSULT NOTE - Initial Consult  Pharmacy Consult for heparin Indication: chest pain/ACS  Allergies  Allergen Reactions   Sulfa Antibiotics Other (See Comments)    High fever Other reaction(s): Fever    Patient Measurements: Height: 5\' 2"  (157.5 cm) Weight: 81.6 kg (180 lb) IBW/kg (Calculated) : 50.1 Heparin Dosing Weight: 68.3kg  Vital Signs: Temp: 98.4 F (36.9 C) (09/05 2201) BP: 189/88 (09/05 2357) Pulse Rate: 106 (09/05 2357)  Labs: Recent Labs    09/22/20 2204 09/22/20 2322  HGB 13.0  --   HCT 38.6  --   PLT 116*  --   CREATININE 0.80  --   TROPONINIHS 17 34*    Estimated Creatinine Clearance: 62.9 mL/min (by C-G formula based on SCr of 0.8 mg/dL).   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 fatty liver   Sarcoidosis    Sarcoidosis of lung (HCC)    Sarcoidosis of lymph nodes      Assessment:  70 y.o. female who have the onset of chest discomfort beginning about 6:30 PM.  The onset was gradual.  She describes the pain as a pressure or tightness in the substernal region but it is also radiating down her left arm to her left hand.  Pharmacy consulted to dose heparin for ACS/STEMI.  No prior AC noted  Hgb 13, Plts 116, Scr 0.8, Trop 34 Baseline labs ordered STAT  Goal of Therapy:  Heparin level 0.3-0.7 units/ml Monitor platelets by anticoagulation protocol: Yes   Plan:  Heparin bolus 4000 units x 1 then start Heparin drip at 800 units/hr Heparin level in 8 hours Daily CBC  61 RPh 09/23/2020, 12:51 AM

## 2020-09-23 NOTE — Consult Note (Addendum)
Cardiology Consultation:   Patient ID: [name] MRN: [id number]; DOB: [birth year]  Admit date: 09/22/2020 Date of Consult: 09/23/2020  PCP:  Ralene Ok, MD   [facility] Providers Cardiologist:  Jodelle Red, MD   {  Patient Profile:   [name] is a 70 y.o. female with a history of chest pain with negative Myoview in 02/2018, PVCs on monitor in 02/2020, sarcoidosis, hypertension, hyperlipidemia, type 2 diabetes, and hypothyroidism who is being seen for evaluation of unstable angina at the request of Dr. Antionette Char.  History of Present Illness:   [name] is a 70 year old female with the above history who is followed by Dr. Cristal Deer.  Referred to Dr. Cristal Deer in January 2020 for further evaluation of chest pain and palpitations.  At that visit, she described a 3-hour episode of chest pain that woke her up from a nap with associated sweatiness and palpitations.  She has chronic shortness of breath with exertion when going up hills due to her sarcoidosis but no other cardiac symptoms.  She was noted to have a 3/6 murmur on exam and EKG the ED the day prior showed ventricular quadrigeminy.  Lexiscan Myoview, Echo, ZIO monitor were all ordered for further evaluation.  Myoview was low risk with no evidence of ischemia or infarction.  No showed LVEF 60 to 65% with systolic anterior motion of the mitral valve and a small intracavitary gradient.  ZIO monitor showed underlying sinus rhythm with isolated PACs and a PVC burden of 4.8% with rare couplets and some bigeminy and trigeminy episodes.  There was no evidence of VT, SVT, atrial fibrillation, high degree block, or pauses.  Patient has not been to by Cardiology since that time.  Patient presented to the [facility], ED on the evening of 09/22/2020 for further evaluation evaluation of chest pain. Patient reports sudden onset of chest pain around 6:30pm on day of presentation. She describes the pain as a deep pressure/aching sensation that  radiated to her left arm all the way to her hand at times. She tried taking Tylenol, Ibuprofen, Maalox, and Mylanta with no relief so decided to come to the ED. No other associated symptoms with the pain - no diaphoresis, nausea/vomiting, or shortness of breath. She had another episodes of chest pain a few nights ago but states this felt different and felt like reflux and resolved with antacids. No exertional chest pain. She has chronic dyspnea with exertion from her sarcoidosis which she thinks has gradually been getting worse but no shortness of breath at rest. No orthopnea or PND. She has occasional lower extremity edema which she takes Lasix for. Elevating legs normally helps this. She does states both lower extremities were very swollen yesterday afternoon so she took a dose of Lasix and elevated her Lasix which has helped. She notes occasional palpitations but no lightheadedness, dizziness, or syncope. No recent fevers or illnesses. She has had problems with cervical radiculopathy lately and recently had a C-spine injection. No nasal congestion or cough. She does describe some hematochezia a few nights ago but no other abnormal bleeding. She states she had a colonoscopy last year ago which showed some polyps but was otherwise OK.  In the ED, patient was markedly hypertensive with systolic BP as high as 210 and tachycardic.  EKG showed sinus tachycardia, rate 113 bpm, with very slight ST depressions in leads V3-V6 (most prominent in V5).  Initial high-sensitivity troponin negative at 17 but repeats positive at 34 >> 151 >> 196.  BNP  mildly elevated at 179.  D-dimer negative.  Chest x-ray showed patchy and interstitial opacities in the left lung base worrisome for infection as well as central interstitial prominence which may be related to mild edema or infection/inflammation.  WBC 4.8, Hgb, 13.0, Plts 116. Na 135, K 3.9, Glucose 376, BUN 16, Cr, 0.80. COVID-19 testing negative.  She was given Aspirin 325mg   and sublingual Nitro and pain resolved. Patient was started on IV heparin and admitted for further evaluation. Cardiology consulted.   At the time of this evaluation, patient chest pain free.  She denies any history of tobacco use. She does have a strong family history of CAD. Her father had multiple MI (first one at the age of 72) and actually died during a heart cath in his 7080s. Two of her brothers have a history of MI in their 4250s and 2660s (one brother died suddenly). Also has a sister who has had a MI and CABG.  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 fatty liver   Sarcoidosis    Sarcoidosis of lung (HCC)    Sarcoidosis of lymph nodes     Past Surgical History:  Procedure Laterality Date   ABDOMINAL HYSTERECTOMY     ABDOMINAL SURGERY     CHOLECYSTECTOMY     HERNIA REPAIR     nissenfundiplication       Home Medications:  Prior to Admission medications   Medication Sig Start Date End Date Taking? Authorizing Provider  baclofen (LIORESAL) 10 MG tablet Take 10 mg by mouth 3 (three) times daily as needed for pain. 11/02/17   [provider]  calcium-vitamin D (OSCAL WITH D) 500-200 MG-UNIT tablet Take 1 tablet by mouth daily with breakfast.    [provider]  clindamycin (CLEOCIN) 300 MG capsule Take 300 mg by mouth 3 (three) times daily. 7 day supply 09/11/20   [provider]  furosemide (LASIX) 20 MG tablet Take 20 mg by mouth daily as needed for fluid or edema.  11/10/14   [provider]  gabapentin (NEURONTIN) 300 MG capsule Take 600 mg by mouth at bedtime. 09/02/20   [provider]  hydrALAZINE (APRESOLINE) 25 MG tablet Take 25 mg by mouth 2 (two) times daily. 09/02/20   [provider]  levothyroxine (SYNTHROID,  LEVOTHROID) 150 MCG tablet Take 75-150 mcg by mouth daily before breakfast. Pt alternates between 150 mcg and 75 mcg each day    [provider]  loratadine (CLARITIN) 10 MG tablet Take 10 mg by mouth daily as needed for allergies.     [provider]  metFORMIN (GLUCOPHAGE-XR) 500 MG 24 hr tablet Take 500 mg by mouth daily with breakfast.  12/21/14   [provider]  Olmesartan-amLODIPine-HCTZ 40-10-25 MG TABS Take 1 tablet by mouth daily.    [provider]  omeprazole (PRILOSEC) 40 MG capsule Take 40 mg by mouth daily.    [provider]  oxyCODONE-acetaminophen (PERCOCET) 5-325 MG tablet Take 1-2 tablets by mouth every 6 (six) hours as needed. 06/10/20   Geoffery Lyonselo, Douglas, MD  predniSONE (STERAPRED UNI-PAK 21 TAB) 10 MG (21) TBPK tablet Take by mouth daily. Take 6 tabs by mouth daily  for 2 days, then 5 tabs for  2 days, then 4 tabs for 2 days, then 3 tabs for 2 days, 2 tabs for 2 days, then 1 tab by mouth daily for 2 days 06/10/20   Geoffery Lyons, MD  zolpidem (AMBIEN) 10 MG tablet Take 10 mg by mouth at bedtime as needed for sleep.    [provider]    Inpatient Medications: Scheduled Meds:  amLODipine  10 mg Oral Daily   aspirin EC  81 mg Oral Daily   dextrose       [START ON 09/24/2020] gabapentin  300 mg Oral TID   hydrochlorothiazide  25 mg Oral Daily   insulin aspart  0-6 Units Subcutaneous Q4H   irbesartan  75 mg Oral Daily   levothyroxine  75-150 mcg Oral QAC breakfast   rosuvastatin  20 mg Oral Daily   Continuous Infusions:  heparin 800 Units/hr (09/23/20 0136)   PRN Meds: acetaminophen, hydrALAZINE, nitroGLYCERIN, ondansetron (ZOFRAN) IV, zolpidem  Allergies:    Allergies  Allergen Reactions   Sulfa Antibiotics Other (See Comments)    High fever Other reaction(s): Fever   Atorvastatin Other (See Comments)    Extreme muscle cramps and muscle aches    Social History:   Social History   Socioeconomic History   Marital  status: Married    Spouse name: Not on file   Number of children: Not on file   Years of education: Not on file   Highest education level: Not on file  Occupational History   Not on file  Tobacco Use   Smoking status: Never   Smokeless tobacco: Never  Vaping Use   Vaping Use: Never used  Substance and Sexual Activity   Alcohol use: No   Drug use: No   Sexual activity: Yes  Other Topics Concern   Not on file  Social History Narrative   Not on file   Social Determinants of Health   Financial Resource Strain: Not on file  Food Insecurity: Not on file  Transportation Needs: Not on file  Physical Activity: Not on file  Stress: Not on file  Social Connections: Not on file  Intimate Partner Violence: Not on file    Family History:   Family History  Problem Relation Age of Onset   Heart failure Father    Hypertension Father    Heart attack Father    Heart attack Mother    Breast cancer Mother 66   Heart attack Brother    Heart attack Brother      ROS:  Please see the history of present illness.  All other ROS reviewed and negative.     Physical Exam/Data:   Vitals:   09/23/20 0133 09/23/20 0140 09/23/20 0211 09/23/20 0824  BP: (!) 181/67 (!) 154/98 (!) 178/67 (!) 173/74  Pulse: 97  98 88  Resp: Temp: 97.9 F (36.6 C)  98.2 F (36.8 C) 98.5 F (36.9 C)  TempSrc: Oral  Oral Oral  SpO2: 98%  97% 97%  Weight:      Height:       No intake or output data in the 24 hours ending 09/23/20 0833 Last 3 Weights 09/22/2020 06/10/2020 02/23/2018  Weight (lbs) 180 lb 175 lb 178 lb  Weight (kg) 81.647 kg 79.379 kg 80.74 kg     Body mass index is 32.92 kg/m.  General: 70 y.o. female resting comfortably in no acute distress.  HEENT: Normocephalic and atraumatic. Sclera clear.  Neck: Supple. No JVD. Heart: RRR. Distinct S1  and S2. No murmurs, gallops, or rubs. Right radial pulse 2+ and equal bilaterally. Lungs: No increased work of breathing. Clear to ausculation  bilaterally. No wheezes, rhonchi, or rales.  Abdomen: Soft, non-distended, and non-tender to palpation. Bowel sounds present. Extremities: Trace lower extremity edema bilaterally. Skin: Warm and dry. Neuro: Alert and oriented x3. No focal deficits. Psych: Normal affect. Responds appropriately.   EKG:  The EKG was personally reviewed and demonstrates:  Sinus tachycardia, rate 113 bpm, with very slight ST depressions in leads V3-V6 (most prominent in V5). Telemetry:  Telemetry was personally reviewed and demonstrates:  Normal sinus rhythm with rates in the 60s to 70s.  Relevant CV Studies:  Echocardiogram 02/23/2018: Impressions:  1. The left ventricle has normal systolic function of 60-65%. The cavity  size was normal. There is mildly increased left ventricular wall  thickness. Echo evidence of impaired diastolic relaxation Elevated left  ventricular end-diastolic pressure.   2. Systolic anterior motion of the mitral valve is noted with a small  intracavitary gradient. Peak velocity 0.84 m/s. Peak gradient 3 mmHg.   3. The right ventricle has normal systolic function. The cavity was  normal. There is no increase in right ventricular wall thickness.   4. The mitral valve is normal in structure.   5. The tricuspid valve is normal in structure.   6. The aortic valve is normal in structure.   7. The pulmonic valve was normal in structure.   8. The average left ventricular global longitudinal strain is -19.3 %. _______________  Myoview 02/23/2018: Nuclear stress EF: 63%. The study is normal. This is a low risk study. The left ventricular ejection fraction is normal (55-65%).   Normal stress nuclear study with no ischemia or infarction; EF 63 with normal wall motion.  _______________  [facility] Monitor 02/2018: 3 days of data recorded on Zio monitor. Patient had a min HR of 58 bpm, max HR of 129 bpm, and avg HR of 75 bpm. Predominant underlying rhythm was Sinus Rhythm. No VT, SVT, atrial  fibrillation, high degree block, or pauses noted. Isolated atrial ectopy was rare (<1%). Overall PVC burden was 4.8%, with rare couplets. Longest ventricular bigeminy episode was 11 seconds, and longest ventricular trigeminy episode was 1 minute 50 seconds. There were no patient triggered events.   Laboratory Data:  High Sensitivity Troponin:   Recent Labs  Lab 09/22/20 2204 09/22/20 2322 09/23/20 0210 09/23/20 0328 09/23/20 0643  TROPONINIHS 17 34* 151* 196* 374*     Chemistry Recent Labs  Lab 09/22/20 2204  NA 135  K 3.9  CL 104  CO2 25  GLUCOSE 376*  BUN 16  CREATININE 0.80  CALCIUM 9.6  GFRNONAA >60  ANIONGAP 6    No results for input(s): PROT, ALBUMIN, AST, ALT, ALKPHOS, BILITOT in the last 168 hours. Hematology Recent Labs  Lab 09/22/20 2204  WBC 4.8  RBC 4.42  HGB 13.0  HCT 38.6  MCV 87.3  MCH 29.4  MCHC 33.7  RDW 12.7  PLT 116*   BNP Recent Labs  Lab 09/22/20 2322  BNP 179.2*    DDimer  Recent Labs  Lab 09/22/20 2322  DDIMER 0.40     Radiology/Studies:  DG Chest 2 View  Result Date: 09/22/2020 CLINICAL DATA:  Chest pain EXAM: CHEST - 2 VIEW COMPARISON:  Chest x-ray 02/07/2018 FINDINGS: The cardiomediastinal silhouette appears within normal limits. There is central interstitial prominence bilaterally. There also interstitial and patchy opacities in the left lung base. There is no pleural  effusion or pneumothorax identified. No acute fractures are seen. There are surgical clips in the right abdomen. IMPRESSION: 1. Patchy and interstitial opacities in the left lung base worrisome for infection. Follow-up imaging recommended in 2 months or 2 menstrual cycles to confirm resolution. 2. Central interstitial prominence may be related to mild edema or infection/inflammation. Electronically Signed   By: Darliss Cheney M.D.   On: 09/22/2020 22:18     Assessment and Plan:   NSTEMI  - Patient presented with chest pain.  Markedly hypertensive on arrival. -  EKG showed sinus tachycardia with very slight ST depressions in V3-V6 (most prominent in V5) but this has been seen on prior EKGs. - High-sensitivity troponin 17 >> 34 >> 151 >> 196 >> 375. - We will check Echo. - Continue IV Heparin. - Continue aspirin and high-intensity statin. - We will start beta-blocker - Coreg 3.125mg  twice daily. - Possible that this is demand ischemia in the setting of hypertensive urgency.  However patient has multiple cardiovascular risk factors (HTN, HLD, DM, and strong family history); therefore, suspect patient will need cardiac catheterization.   The patient understands that risks include but are not limited to stroke (1 in 1000), death (1 in 1000), kidney failure [usually temporary] (1 in 500), bleeding (1 in 200), allergic reaction [possibly serious] (1 in 200), and agrees to proceed.   Hypertensive Urgency - Systolic BP as high as 210 on arrival. Improved but still elevated. However, patient state BP usually well controlled at home. - On Olmesartan-Amlodipine-HCTZ 40-10-25mg  daily at home. Currently on Amlodipine 10mg  daily, Irbesartan 75mg  daily, HCTZ 25mg  daily, and Hydralazine 25mg  twice daily here.  - Will change Hydralazine to PRN and start Coreg 3.125mg  twice daily.  Lower Extremity Edema - BNP mildly elevated in the 170s. She has some trace lower extremity edema on exam but otherwise appears euvolemic on exam.  - On Lasix 20mg  daily as needed at home. Will hold this for now in anticipation of cardiac catheterization today.  Hyperlipidemia - Lipid panel this admission: Total Cholesterol 116, Triglycerides 24, HDL 48, LDL 63.  - Intolerant to Lipitor in the past. Started on Crestor 20mg  daily this admission. Continue.   Type 2 Diabetes  - Hemoglobin A1c 5.8 this admission. - Management per primary team.  Hematochezia - Patient reports bright red blood in stools a couple of nights ago. She states this happens occasionally (normally if she has had a  lot of Ibuprofen but this was not the case the other night). She states she had a colonoscopy last year. - Hemoglobin normal at 13.0. - Monitor for signs of bleeding while on IV Heparin/antiplatelets.  - Follow-up with PCP.  Otherwise, per primary team. - Sarcoidosis - Thrombocytopenia - Hypothyroidism - Cervical radiculopathy - Patchy and interstitial opacities in left lung base on chest x-ray  Risk Assessment/Risk Scores:   TIMI Risk Score for Unstable Angina or Non-ST Elevation MI:   The patient's TIMI risk score is 3, which indicates a 13% risk of all cause mortality, new or recurrent myocardial infarction or need for urgent revascularization in the next 14 days.{     For questions or updates, please contact [facility] Please consult [email or web address] for contact info under    Signed, , PA-C  09/23/2020 8:33 AM  Personally seen and examined. Agree with APP above with the following comments: Briefly 70 yo F with a history of HTN, HLD, DM, and Sarcoid with typical anginal CP.   Patient notes that her  CP was relieved with nitro.  Described resting sudden onset CP with diaphoresis that radiates down her left arm Exam notable for +2 radial and femoral arteries.  Notable systolic murmur Labs notable for troponin continuing to rise. Personally reviewed relevant tests; Echo in the pass with small intracavitary gradients:  has mild R CAS and Aortic atherosclerosis on prior CT neck.  Echo septal thickness 1.6 cm. Would recommend  - ASA, heparin statin and plan for LHC Risks and benefits of cardiac catheterization have been discussed with the patient.  These include bleeding, infection, kidney damage, stroke, heart attack, death.  The patient understands these risks and is willing to proceed. - will get echo; given SAM and hx of Sarcoidosis; if widely patent LHC will pursue CMR (can be done outpatient) - LDL at goal  Riley Lam, MD Cardiologist [facility]  [address] [place], [place] [address] [phone]  9:00 AM

## 2020-09-24 DIAGNOSIS — I493 Ventricular premature depolarization: Secondary | ICD-10-CM | POA: Diagnosis present

## 2020-09-24 DIAGNOSIS — Z20822 Contact with and (suspected) exposure to covid-19: Secondary | ICD-10-CM | POA: Diagnosis present

## 2020-09-24 DIAGNOSIS — Z79890 Hormone replacement therapy (postmenopausal): Secondary | ICD-10-CM | POA: Diagnosis not present

## 2020-09-24 DIAGNOSIS — I214 Non-ST elevation (NSTEMI) myocardial infarction: Secondary | ICD-10-CM | POA: Diagnosis not present

## 2020-09-24 DIAGNOSIS — I10 Essential (primary) hypertension: Secondary | ICD-10-CM | POA: Diagnosis not present

## 2020-09-24 DIAGNOSIS — I700 Atherosclerosis of aorta: Secondary | ICD-10-CM | POA: Diagnosis present

## 2020-09-24 DIAGNOSIS — Z882 Allergy status to sulfonamides status: Secondary | ICD-10-CM | POA: Diagnosis not present

## 2020-09-24 DIAGNOSIS — D869 Sarcoidosis, unspecified: Secondary | ICD-10-CM

## 2020-09-24 DIAGNOSIS — I25110 Atherosclerotic heart disease of native coronary artery with unstable angina pectoris: Secondary | ICD-10-CM | POA: Diagnosis present

## 2020-09-24 DIAGNOSIS — D862 Sarcoidosis of lung with sarcoidosis of lymph nodes: Secondary | ICD-10-CM | POA: Diagnosis present

## 2020-09-24 DIAGNOSIS — E1165 Type 2 diabetes mellitus with hyperglycemia: Secondary | ICD-10-CM | POA: Diagnosis present

## 2020-09-24 DIAGNOSIS — R0789 Other chest pain: Secondary | ICD-10-CM | POA: Diagnosis present

## 2020-09-24 DIAGNOSIS — M5412 Radiculopathy, cervical region: Secondary | ICD-10-CM | POA: Diagnosis present

## 2020-09-24 DIAGNOSIS — I160 Hypertensive urgency: Secondary | ICD-10-CM | POA: Diagnosis present

## 2020-09-24 DIAGNOSIS — E039 Hypothyroidism, unspecified: Secondary | ICD-10-CM | POA: Diagnosis present

## 2020-09-24 DIAGNOSIS — Z8249 Family history of ischemic heart disease and other diseases of the circulatory system: Secondary | ICD-10-CM | POA: Diagnosis not present

## 2020-09-24 DIAGNOSIS — I5033 Acute on chronic diastolic (congestive) heart failure: Secondary | ICD-10-CM | POA: Diagnosis present

## 2020-09-24 DIAGNOSIS — Z28310 Unvaccinated for covid-19: Secondary | ICD-10-CM | POA: Diagnosis not present

## 2020-09-24 DIAGNOSIS — R079 Chest pain, unspecified: Secondary | ICD-10-CM | POA: Diagnosis not present

## 2020-09-24 DIAGNOSIS — E785 Hyperlipidemia, unspecified: Secondary | ICD-10-CM | POA: Diagnosis present

## 2020-09-24 DIAGNOSIS — D696 Thrombocytopenia, unspecified: Secondary | ICD-10-CM | POA: Diagnosis present

## 2020-09-24 DIAGNOSIS — I110 Hypertensive heart disease with heart failure: Secondary | ICD-10-CM | POA: Diagnosis present

## 2020-09-24 DIAGNOSIS — Z90710 Acquired absence of both cervix and uterus: Secondary | ICD-10-CM | POA: Diagnosis not present

## 2020-09-24 DIAGNOSIS — R008 Other abnormalities of heart beat: Secondary | ICD-10-CM | POA: Diagnosis present

## 2020-09-24 DIAGNOSIS — Z79899 Other long term (current) drug therapy: Secondary | ICD-10-CM | POA: Diagnosis not present

## 2020-09-24 DIAGNOSIS — Z7984 Long term (current) use of oral hypoglycemic drugs: Secondary | ICD-10-CM | POA: Diagnosis not present

## 2020-09-24 LAB — GLUCOSE, CAPILLARY
Glucose-Capillary: 104 mg/dL — ABNORMAL HIGH (ref 70–99)
Glucose-Capillary: 105 mg/dL — ABNORMAL HIGH (ref 70–99)
Glucose-Capillary: 110 mg/dL — ABNORMAL HIGH (ref 70–99)
Glucose-Capillary: 110 mg/dL — ABNORMAL HIGH (ref 70–99)
Glucose-Capillary: 111 mg/dL — ABNORMAL HIGH (ref 70–99)
Glucose-Capillary: 166 mg/dL — ABNORMAL HIGH (ref 70–99)
Glucose-Capillary: 99 mg/dL (ref 70–99)

## 2020-09-24 LAB — CBC
HCT: 32.3 % — ABNORMAL LOW (ref 36.0–46.0)
Hemoglobin: 10.7 g/dL — ABNORMAL LOW (ref 12.0–15.0)
MCH: 29.8 pg (ref 26.0–34.0)
MCHC: 33.1 g/dL (ref 30.0–36.0)
MCV: 90 fL (ref 80.0–100.0)
Platelets: 96 10*3/uL — ABNORMAL LOW (ref 150–400)
RBC: 3.59 MIL/uL — ABNORMAL LOW (ref 3.87–5.11)
RDW: 13.2 % (ref 11.5–15.5)
WBC: 4.3 10*3/uL (ref 4.0–10.5)
nRBC: 0 % (ref 0.0–0.2)

## 2020-09-24 LAB — BASIC METABOLIC PANEL
Anion gap: 10 (ref 5–15)
BUN: 11 mg/dL (ref 8–23)
CO2: 22 mmol/L (ref 22–32)
Calcium: 9 mg/dL (ref 8.9–10.3)
Chloride: 107 mmol/L (ref 98–111)
Creatinine, Ser: 0.62 mg/dL (ref 0.44–1.00)
GFR, Estimated: >60 mL/min (ref >60)
Glucose, Bld: 94 mg/dL (ref 70–99)
Potassium: 3.8 mmol/L (ref 3.5–5.1)
Sodium: 139 mmol/L (ref 135–145)

## 2020-09-24 MED ORDER — CARVEDILOL 6.25 MG PO TABS
6.2500 mg | ORAL_TABLET | Freq: Two times a day (BID) | ORAL | Status: DC
  Administered 2020-09-24 – 2020-09-25 (×2): 6.25 mg via ORAL
  Filled 2020-09-24 (×2): qty 1

## 2020-09-24 NOTE — Care Management Obs Status (Signed)
MEDICARE OBSERVATION STATUS NOTIFICATION   Patient Details  Name: [name] MRN: [id number] Date of Birth: [birth year]   Medicare Observation Status Notification Given:  Yes    Gala Lewandowsky, RN 09/24/2020, 12:59 PM

## 2020-09-24 NOTE — Progress Notes (Addendum)
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  Found to have Mild increase in peak to peak gradient 16 mm Hg.  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    Patient notes some residual arm pain but no CP or SOB.  Notes palpitations in the past.  No FHX of SCD in brother at age 72.  Notes near syncope after sitting on the couch watching TV.  Inpatient Medications    Scheduled Meds:  amLODipine  10 mg Oral Daily   aspirin  81 mg Oral Daily   carvedilol  3.125 mg Oral BID WC   gabapentin  300 mg Oral TID   hydrochlorothiazide  25 mg Oral Daily   insulin aspart  0-6 Units Subcutaneous Q4H   irbesartan  75 mg Oral Daily   levothyroxine  150 mcg Oral QODAY   And   levothyroxine  75 mcg Oral QODAY   rosuvastatin  20 mg Oral Daily   sodium chloride flush  3 mL Intravenous Q12H   Continuous Infusions:  sodium chloride     PRN Meds: sodium chloride, acetaminophen, hydrALAZINE, nitroGLYCERIN, ondansetron (ZOFRAN) IV, sodium chloride flush, zolpidem   Vital Signs    Vitals:   09/23/20 2009 09/23/20 2039 09/24/20 0058 09/24/20 0410  BP: (!) 152/61 (!) 158/56 (!) 141/63 (!) 154/60  Pulse: 74 78 78 79  Resp:   15 18  Temp:   98.3 F (36.8 C) 98.2 F (36.8 C)  TempSrc:   Oral Oral  SpO2: 95% 95% 95% 94%  Weight:      Height:        Intake/Output Summary (Last 24 hours) at 09/24/2020 0813 Last data filed at 09/24/2020 0505 Gross per 24 hour  Intake 1588.37 ml  Output 700 ml  Net 888.37 ml   Filed Weights   09/22/20 2211  Weight: 81.6 kg    Telemetry    SR - Personally Reviewed  ECG    SR rate 80 with borderline LVH - Personally Reviewed  Physical Exam   GEN: No acute distress.   Neck: No JVD Cardiac: RRR, mild systolic murmur no rubs, or gallops. With hand grip and valsalva notable increase in  systolic murmur; R radial +2 without bruit or hematoma; R fem site with no hematoma and only mild bruising Respiratory: Clear to auscultation bilaterally. GI: Soft, nontender, non-distended  MS: No edema; No deformity. Neuro:  Nonfocal  Psych: Normal affect   Labs    Chemistry Recent Labs  Lab 09/22/20 2204 09/24/20 0212  NA 135 139  K 3.9 3.8  CL 104 107  CO2 25 22  GLUCOSE 376* 94  BUN 16 11  CREATININE 0.80 0.62  CALCIUM 9.6 9.0  GFRNONAA >60 >60  ANIONGAP 6 10     Hematology Recent Labs  Lab 09/22/20 2204 09/24/20 0212  WBC 4.8 4.3  RBC 4.42 3.59*  HGB 13.0 10.7*  HCT 38.6 32.3*  MCV 87.3 90.0  MCH 29.4 29.8  MCHC 33.7 33.1  RDW 12.7 13.2  PLT 116* 96*    Cardiac EnzymesNo results for input(s): TROPONINI in the last 168 hours. No results for input(s): TROPIPOC in the last 168 hours.   BNP Recent Labs  Lab 09/22/20 2322  BNP 179.2*     DDimer  Recent Labs  Lab 09/22/20  2322  DDIMER 0.40     Radiology    DG Chest 2 View  Result Date: 09/22/2020 CLINICAL DATA:  Chest pain EXAM: CHEST - 2 VIEW COMPARISON:  Chest x-ray 02/07/2018 FINDINGS: The cardiomediastinal silhouette appears within normal limits. There is central interstitial prominence bilaterally. There also interstitial and patchy opacities in the left lung base. There is no pleural effusion or pneumothorax identified. No acute fractures are seen. There are surgical clips in the right abdomen. IMPRESSION: 1. Patchy and interstitial opacities in the left lung base worrisome for infection. Follow-up imaging recommended in 2 months or 2 menstrual cycles to confirm resolution. 2. Central interstitial prominence may be related to mild edema or infection/inflammation. Electronically Signed   By: Darliss Cheney M.D.   On: 09/22/2020 22:18   CARDIAC CATHETERIZATION  Result Date: 09/23/2020   Prox Cx lesion is 20% stenosed. Mild nonobstructive CAD with tortuous vessels and smooth 20% narrowing in the  proximal circumflex far artery.  The LAD appears angiographically normal.  The right coronary artery is a dominant vessel with superior takeoff. Mild LV to aorta gradient on pullback with a peak to peak gradient at approximately 16 mmHg. RECOMMENDATION: Medical therapy.   ECHOCARDIOGRAM COMPLETE  Result Date: 09/23/2020    ECHOCARDIOGRAM REPORT   Patient Name:   [name] [facility] Date of Exam: 09/23/2020 Medical Rec #:  [id number]    Height:       62.0 in Accession #:    [id number]   Weight:       180.0 lb Date of Birth:  01/29/1950    BSA:          1.828 m Patient Age:    70 years     BP:           111/65 mmHg Patient Gender: F            HR:           76 bpm. Exam Location:  Inpatient Procedure: 2D Echo, 3D Echo, Cardiac Doppler, Color Doppler and Strain Analysis Indications:    122-I22.9 Subsequent ST elevation (STEM) and non-ST elevation                 (NSTEMI) myocardial infarction  History:        Patient has prior history of Echocardiogram examinations, most                 recent 02/23/2018. Mitral Valve Disease, Signs/Symptoms:Murmur;                 Risk Factors:Hypertension and Diabetes.  Sonographer:    Sheralyn Boatman RDCS Referring Phys: [id number] Corrin Parker  Sonographer Comments: Technically difficult study due to poor echo windows. IMPRESSIONS  1. Left ventricular ejection fraction, by estimation, is 65 to 70%. The left ventricle has normal function. The left ventricle has no regional wall motion abnormalities. Left ventricular diastolic parameters are consistent with Grade II diastolic dysfunction (pseudonormalization).  2. Right ventricular systolic function is normal. The right ventricular size is normal.  3. Left atrial size was mildly dilated.  4. There is systolic anterior motion of the MV leaflet. . The mitral valve is normal in structure. Moderate mitral valve regurgitation.  5. The aortic valve is normal in structure. Aortic valve regurgitation is not visualized. FINDINGS  Left Ventricle: Left  ventricular ejection fraction, by estimation, is 65 to 70%. The left ventricle has normal function. The left ventricle has no regional wall motion abnormalities. The left ventricular  internal cavity size was normal in size. There is  borderline left ventricular hypertrophy. Left ventricular diastolic parameters are consistent with Grade II diastolic dysfunction (pseudonormalization). Right Ventricle: The right ventricular size is normal. Right vetricular wall thickness was not well visualized. Right ventricular systolic function is normal. Left Atrium: Left atrial size was mildly dilated. Right Atrium: Right atrial size was normal in size. Pericardium: There is no evidence of pericardial effusion. Mitral Valve: There is systolic anterior motion of the MV leaflet. The mitral valve is normal in structure. Moderate mitral valve regurgitation. Tricuspid Valve: The tricuspid valve is grossly normal. Tricuspid valve regurgitation is trivial. Aortic Valve: The aortic valve is normal in structure. Aortic valve regurgitation is not visualized. Pulmonic Valve: The pulmonic valve was grossly normal. Pulmonic valve regurgitation is not visualized. Aorta: The aortic root and ascending aorta are structurally normal, with no evidence of dilitation. IAS/Shunts: The atrial septum is grossly normal.  LEFT VENTRICLE PLAX 2D LVIDd:         4.60 cm     Diastology LVIDs:         2.30 cm     LV e' medial:    6.31 cm/s LV PW:         1.20 cm     LV E/e' medial:  14.5 LV IVS:        1.10 cm     LV e' lateral:   6.09 cm/s LVOT diam:     1.60 cm     LV E/e' lateral: 15.1 LV SV:         67 LV SV Index:   37 LVOT Area:     2.01 cm  LV Volumes (MOD) LV vol d, MOD A2C: 45.7 ml LV vol d, MOD A4C: 78.2 ml LV vol s, MOD A2C: 17.1 ml LV vol s, MOD A4C: 18.1 ml LV SV MOD A2C:     28.6 ml LV SV MOD A4C:     78.2 ml LV SV MOD BP:      44.8 ml RIGHT VENTRICLE             IVC RV S prime:     12.10 cm/s  IVC diam: 3.00 cm TAPSE (M-mode): 1.9 cm LEFT  ATRIUM             Index       RIGHT ATRIUM           Index LA diam:        3.80 cm 2.08 cm/m  RA Area:     13.20 cm LA Vol (A2C):   71.1 ml 38.90 ml/m RA Volume:   31.10 ml  17.01 ml/m LA Vol (A4C):   67.3 ml 36.82 ml/m LA Biplane Vol: 72.8 ml 39.83 ml/m  AORTIC VALVE LVOT Vmax:   182.00 cm/s LVOT Vmean:  114.000 cm/s LVOT VTI:    0.333 m  AORTA Ao Root diam: 3.00 cm Ao Asc diam:  2.90 cm MITRAL VALVE MV Area (PHT): 3.27 cm      SHUNTS MV Decel Time: 232 msec      Systemic VTI:  0.33 m MR Peak grad:    138.3 mmHg  Systemic Diam: 1.60 cm MR Mean grad:    79.0 mmHg MR Vmax:         588.00 cm/s MR Vmean:        396.0 cm/s MR PISA:         1.01 cm MR PISA Eff ROA: 7 mm MR PISA  Radius:  0.40 cm MV E velocity: 91.80 cm/s MV A velocity: 95.50 cm/s MV E/A ratio:  0.96 Kristeen MissPhilip Nahser MD Electronically signed by Kristeen MissPhilip Nahser MD Signature Date/Time: 09/23/2020/2:41:21 PM    Final      Patient Profile     70 y.o. female history of sarcoidosis, HTN with DM, HLD with DM, and LVOT and associated MR who presents with chest pain and normal cath.  Assessment & Plan    Chest Pain HTN with DM HLD with DM Mild R CAS Aortic Atherosclerosis Hx of sarcoidosis and FHX of SCD - Continue ASA and statin; LDL at goal - stopped HCTZ - will increase BB - will order CMR with patient (Has question of macrovascular disease, hx of sarcoidosis, has SAM and LV hypertrophy)   For questions or updates, please contact [facility] Please consult [email or web address] for contact info under Cardiology/STEMI.      Signed, Christell ConstantMahesh A Tige Meas, MD  09/24/2020, 8:13 AM

## 2020-09-24 NOTE — Progress Notes (Signed)
TR BAND REMOVAL  LOCATION:    right radial  DEFLATED PER PROTOCOL:    Yes.    TIME BAND OFF / DRESSING APPLIED:    2230   SITE UPON ARRIVAL:    Level 0  SITE AFTER BAND REMOVAL:    Level 0  CIRCULATION SENSATION AND MOVEMENT:    Within Normal Limits   Yes.    COMMENTS:   Pt.tolerated procedure well ; 

## 2020-09-24 NOTE — Progress Notes (Signed)
[facility] PROGRESS NOTE    Progress Note  [name] RuskMary L Bodkins  ZOX:[id number] DOB: 1950/03/07 DOA: 09/22/2020 PCP: Ralene OkMoreira, Roy, MD     Brief Narrative:   [name] is an 70 y.o. female past medical history significant for essential hypertension, diabetes mellitus type 2 sarcoid diagnosed in 2010 presents to the ED for evaluation of chest pain she also started having new onset bilateral foot swelling she had a low normal nuclear stress test in 2020 and an echocardiogram with a preserved EF, diastolic dysfunction issues found to have a systolic blood pressure of 210 EKG shows sinus tachycardia chest x-ray showed bilateral opacities.    Assessment/Plan:   Atypical chest pain Chest pain relieved by nitro no exacerbating factors. Allergy was consulted recommended aspirin, heparin and statin along with a left heart cath showed mild coronary artery disease recommended conservative management.. Continue aspirin and statins LDL is at goal. Stop hydrochlorothiazide increase beta-blocker Cardiology recommended cardiac MRI.  Essential hypertension: Increase ARB, continue Coreg and amlodipine hold hydrochlorothiazide and continue hydralazine.  Diabetes mellitus type 2: A1c 8.8 she was given 2 units of insulin her blood glucose now is 104.  Continue CBGs before meals and at bedtime.  Chronic diastolic heart failure: She is not in acute decompensated heart failure continue Lasix at current home dose.  Thrombocytopenia: Has been intermittently low in the past. Now off heparin continue to monitor.  Hypothyroidism Continue Synthroid.  Cervical radiculopathy: Noted follow-up with PCP as an outpatient.  DVT prophylaxis: lovenox Family Communication:none Status is: Observation  The patient remains OBS appropriate and will d/c before 2 midnights.  Dispo: The patient is from: Home              Anticipated d/c is to: Home              Patient currently is not medically stable to  d/c.   Difficult to place patient No        Code Status:     Code Status Orders  (From admission, onward)           Start     Ordered   09/23/20 0144  Full code  Continuous        09/23/20 0145           Code Status History     Date Active Date Inactive Code Status Order ID Comments User Context   06/22/2012 1842 06/23/2012 1903 Full Code [id number]  Laveda Normanti, Chris N, MD Inpatient         IV Access:   Peripheral IV   Procedures and diagnostic studies:   DG Chest 2 View  Result Date: 09/22/2020 CLINICAL DATA:  Chest pain EXAM: CHEST - 2 VIEW COMPARISON:  Chest x-ray 02/07/2018 FINDINGS: The cardiomediastinal silhouette appears within normal limits. There is central interstitial prominence bilaterally. There also interstitial and patchy opacities in the left lung base. There is no pleural effusion or pneumothorax identified. No acute fractures are seen. There are surgical clips in the right abdomen. IMPRESSION: 1. Patchy and interstitial opacities in the left lung base worrisome for infection. Follow-up imaging recommended in 2 months or 2 menstrual cycles to confirm resolution. 2. Central interstitial prominence may be related to mild edema or infection/inflammation. Electronically Signed   By: Darliss CheneyAmy  Guttmann M.D.   On: 09/22/2020 22:18   CARDIAC CATHETERIZATION  Result Date: 09/23/2020   Prox Cx lesion is 20% stenosed. Mild nonobstructive CAD with tortuous vessels and smooth 20% narrowing in the proximal  circumflex far artery.  The LAD appears angiographically normal.  The right coronary artery is a dominant vessel with superior takeoff. Mild LV to aorta gradient on pullback with a peak to peak gradient at approximately 16 mmHg. RECOMMENDATION: Medical therapy.   ECHOCARDIOGRAM COMPLETE  Result Date: 09/23/2020    ECHOCARDIOGRAM REPORT   Patient Name:   [name] [facility] Date of Exam: 09/23/2020 Medical Rec #:  [id number]    Height:       62.0 in Accession #:    [id number]   Weight:        180.0 lb Date of Birth:  [birth year]    BSA:          1.828 m Patient Age:    70 years     BP:           111/65 mmHg Patient Gender: F            HR:           76 bpm. Exam Location:  Inpatient Procedure: 2D Echo, 3D Echo, Cardiac Doppler, Color Doppler and Strain Analysis Indications:    122-I22.9 Subsequent ST elevation (STEM) and non-ST elevation                 (NSTEMI) myocardial infarction  History:        Patient has prior history of Echocardiogram examinations, most                 recent 02/23/2018. Mitral Valve Disease, Signs/Symptoms:Murmur;                 Risk Factors:Hypertension and Diabetes.  Sonographer:    Sheralyn Boatman RDCS Referring Phys: [id number] Corrin Parker  Sonographer Comments: Technically difficult study due to poor echo windows. IMPRESSIONS  1. Left ventricular ejection fraction, by estimation, is 65 to 70%. The left ventricle has normal function. The left ventricle has no regional wall motion abnormalities. Left ventricular diastolic parameters are consistent with Grade II diastolic dysfunction (pseudonormalization).  2. Right ventricular systolic function is normal. The right ventricular size is normal.  3. Left atrial size was mildly dilated.  4. There is systolic anterior motion of the MV leaflet. . The mitral valve is normal in structure. Moderate mitral valve regurgitation.  5. The aortic valve is normal in structure. Aortic valve regurgitation is not visualized. FINDINGS  Left Ventricle: Left ventricular ejection fraction, by estimation, is 65 to 70%. The left ventricle has normal function. The left ventricle has no regional wall motion abnormalities. The left ventricular internal cavity size was normal in size. There is  borderline left ventricular hypertrophy. Left ventricular diastolic parameters are consistent with Grade II diastolic dysfunction (pseudonormalization). Right Ventricle: The right ventricular size is normal. Right vetricular wall thickness was not well  visualized. Right ventricular systolic function is normal. Left Atrium: Left atrial size was mildly dilated. Right Atrium: Right atrial size was normal in size. Pericardium: There is no evidence of pericardial effusion. Mitral Valve: There is systolic anterior motion of the MV leaflet. The mitral valve is normal in structure. Moderate mitral valve regurgitation. Tricuspid Valve: The tricuspid valve is grossly normal. Tricuspid valve regurgitation is trivial. Aortic Valve: The aortic valve is normal in structure. Aortic valve regurgitation is not visualized. Pulmonic Valve: The pulmonic valve was grossly normal. Pulmonic valve regurgitation is not visualized. Aorta: The aortic root and ascending aorta are structurally normal, with no evidence of dilitation. IAS/Shunts: The atrial septum is grossly normal.  LEFT VENTRICLE PLAX  2D LVIDd:         4.60 cm     Diastology LVIDs:         2.30 cm     LV e' medial:    6.31 cm/s LV PW:         1.20 cm     LV E/e' medial:  14.5 LV IVS:        1.10 cm     LV e' lateral:   6.09 cm/s LVOT diam:     1.60 cm     LV E/e' lateral: 15.1 LV SV:         67 LV SV Index:   37 LVOT Area:     2.01 cm  LV Volumes (MOD) LV vol d, MOD A2C: 45.7 ml LV vol d, MOD A4C: 78.2 ml LV vol s, MOD A2C: 17.1 ml LV vol s, MOD A4C: 18.1 ml LV SV MOD A2C:     28.6 ml LV SV MOD A4C:     78.2 ml LV SV MOD BP:      44.8 ml RIGHT VENTRICLE             IVC RV S prime:     12.10 cm/s  IVC diam: 3.00 cm TAPSE (M-mode): 1.9 cm LEFT ATRIUM             Index       RIGHT ATRIUM           Index LA diam:        3.80 cm 2.08 cm/m  RA Area:     13.20 cm LA Vol (A2C):   71.1 ml 38.90 ml/m RA Volume:   31.10 ml  17.01 ml/m LA Vol (A4C):   67.3 ml 36.82 ml/m LA Biplane Vol: 72.8 ml 39.83 ml/m  AORTIC VALVE LVOT Vmax:   182.00 cm/s LVOT Vmean:  114.000 cm/s LVOT VTI:    0.333 m  AORTA Ao Root diam: 3.00 cm Ao Asc diam:  2.90 cm MITRAL VALVE MV Area (PHT): 3.27 cm      SHUNTS MV Decel Time: 232 msec      Systemic VTI:   0.33 m MR Peak grad:    138.3 mmHg  Systemic Diam: 1.60 cm MR Mean grad:    79.0 mmHg MR Vmax:         588.00 cm/s MR Vmean:        396.0 cm/s MR PISA:         1.01 cm MR PISA Eff ROA: 7 mm MR PISA Radius:  0.40 cm MV E velocity: 91.80 cm/s MV A velocity: 95.50 cm/s MV E/A ratio:  0.96 Kristeen Miss MD Electronically signed by Kristeen Miss MD Signature Date/Time: 09/23/2020/2:41:21 PM    Final      Medical Consultants:   None.   Subjective:    [name] Rusk denies any chest pain.  Objective:    Vitals:   09/24/20 0058 09/24/20 0410 09/24/20 0906 09/24/20 1111  BP: (!) 141/63 (!) 154/60 (!) 174/63 (!) 158/58  Pulse: 78 79 80 89  Resp: 15 18 18 18   Temp: 98.3 F (36.8 C) 98.2 F (36.8 C) 98.1 F (36.7 C) 98.2 F (36.8 C)  TempSrc: Oral Oral Oral Oral  SpO2: 95% 94% 96% 100%  Weight:      Height:       SpO2: 100 % O2 Flow Rate (L/min): 2 L/min   Intake/Output Summary (Last 24 hours) at 09/24/2020 1121 Last data filed  at 09/24/2020 0900 Gross per 24 hour  Intake 1828.37 ml  Output 700 ml  Net 1128.37 ml   Filed Weights   09/22/20 2211  Weight: 81.6 kg    Exam: General exam: In no acute distress. Respiratory system: Good air movement and clear to auscultation. Cardiovascular system: S1 & S2 heard, RRR. No JVD.  Gastrointestinal system: Abdomen is nondistended, soft and nontender.  Extremities: No pedal edema. Skin: No rashes, lesions or ulcers Psychiatry: Judgement and insight appear normal. Mood & affect appropriate.    Data Reviewed:    Labs: Basic Metabolic Panel: Recent Labs  Lab 09/22/20 2204 09/24/20 0212  NA 135 139  K 3.9 3.8  CL 104 107  CO2 25 22  GLUCOSE 376* 94  BUN 16 11  CREATININE 0.80 0.62  CALCIUM 9.6 9.0   GFR Estimated Creatinine Clearance: 62.9 mL/min (by C-G formula based on SCr of 0.62 mg/dL). Liver Function Tests: No results for input(s): AST, ALT, ALKPHOS, BILITOT, PROT, ALBUMIN in the last 168 hours. No results for  input(s): LIPASE, AMYLASE in the last 168 hours. No results for input(s): AMMONIA in the last 168 hours. Coagulation profile Recent Labs  Lab 09/22/20 2322  INR 1.0   COVID-19 Labs  Recent Labs    09/22/20 2322  DDIMER 0.40    Lab Results  Component Value Date   SARSCOV2NAA NEGATIVE 09/23/2020    CBC: Recent Labs  Lab 09/22/20 2204 09/24/20 0212  WBC 4.8 4.3  HGB 13.0 10.7*  HCT 38.6 32.3*  MCV 87.3 90.0  PLT 116* 96*   Cardiac Enzymes: No results for input(s): CKTOTAL, CKMB, CKMBINDEX, TROPONINI in the last 168 hours. BNP (last 3 results) No results for input(s): PROBNP in the last 8760 hours. CBG: Recent Labs  Lab 09/23/20 1856 09/23/20 2004 09/24/20 0057 09/24/20 0404 09/24/20 0737  GLUCAP 111* 109* 99 105* 104*   D-Dimer: Recent Labs    09/22/20 2322  DDIMER 0.40   Hgb A1c: Recent Labs    09/23/20 0210  HGBA1C 5.8*   Lipid Profile: Recent Labs    09/23/20 0210  CHOL 116  HDL 48  LDLCALC 63  TRIG 24  CHOLHDL 2.4   Thyroid function studies: No results for input(s): TSH, T4TOTAL, T3FREE, THYROIDAB in the last 72 hours.  Invalid input(s): FREET3 Anemia work up: No results for input(s): VITAMINB12, FOLATE, FERRITIN, TIBC, IRON, RETICCTPCT in the last 72 hours. Sepsis Labs: Recent Labs  Lab 09/22/20 2204 09/24/20 0212  WBC 4.8 4.3   Microbiology Recent Results (from the past 240 hour(s))  Resp Panel by RT-PCR (Flu A&B, Covid) Nasopharyngeal Swab     Status: None   Collection Time: 09/23/20 12:45 AM   Specimen: Nasopharyngeal Swab; Nasopharyngeal(NP) swabs in vial transport medium  Result Value Ref Range Status   SARS Coronavirus 2 by RT PCR NEGATIVE NEGATIVE Final    Comment: (NOTE) SARS-CoV-2 target nucleic acids are NOT DETECTED.  The SARS-CoV-2 RNA is generally detectable in upper respiratory specimens during the acute phase of infection. The lowest concentration of SARS-CoV-2 viral copies this assay can detect is 138  copies/mL. A negative result does not preclude SARS-Cov-2 infection and should not be used as the sole basis for treatment or other patient management decisions. A negative result may occur with  improper specimen collection/handling, submission of specimen other than nasopharyngeal swab, presence of viral mutation(s) within the areas targeted by this assay, and inadequate number of viral copies(<138 copies/mL). A negative result must be combined  with clinical observations, patient history, and epidemiological information. The expected result is Negative.  Fact Sheet for Patients:  BloggerCourse.com  Fact Sheet for Healthcare Providers:  SeriousBroker.it  This test is no t yet approved or cleared by the [place] FDA and  has been authorized for detection and/or diagnosis of SARS-CoV-2 by FDA under an Emergency Use Authorization (EUA). This EUA will remain  in effect (meaning this test can be used) for the duration of the COVID-19 declaration under Section 564(b)(1) of the Act, 21 U.S.C.section 360bbb-3(b)(1), unless the authorization is terminated  or revoked sooner.       Influenza A by PCR NEGATIVE NEGATIVE Final   Influenza B by PCR NEGATIVE NEGATIVE Final    Comment: (NOTE) The Xpert Xpress SARS-CoV-2/FLU/RSV plus assay is intended as an aid in the diagnosis of influenza from Nasopharyngeal swab specimens and should not be used as a sole basis for treatment. Nasal washings and aspirates are unacceptable for Xpert Xpress SARS-CoV-2/FLU/RSV testing.  Fact Sheet for Patients: BloggerCourse.com  Fact Sheet for Healthcare Providers: SeriousBroker.it  This test is not yet approved or cleared by the [place] FDA and has been authorized for detection and/or diagnosis of SARS-CoV-2 by FDA under an Emergency Use Authorization (EUA). This EUA will remain in effect (meaning  this test can be used) for the duration of the COVID-19 declaration under Section 564(b)(1) of the Act, 21 U.S.C. section 360bbb-3(b)(1), unless the authorization is terminated or revoked.  Performed at [facility], [address], [place], [place] [address]      Medications:    amLODipine  10 mg Oral Daily   aspirin  81 mg Oral Daily   carvedilol  6.25 mg Oral BID WC   gabapentin  300 mg Oral TID   insulin aspart  0-6 Units Subcutaneous Q4H   irbesartan  75 mg Oral Daily   levothyroxine  150 mcg Oral QODAY   And   levothyroxine  75 mcg Oral QODAY   rosuvastatin  20 mg Oral Daily   sodium chloride flush  3 mL Intravenous Q12H   Continuous Infusions:  sodium chloride        LOS: 0 days   Marinda Elk  [facility]  09/24/2020, 11:21 AM

## 2020-09-25 ENCOUNTER — Inpatient Hospital Stay (HOSPITAL_COMMUNITY): Payer: Medicare Other

## 2020-09-25 ENCOUNTER — Other Ambulatory Visit (HOSPITAL_COMMUNITY): Payer: Self-pay

## 2020-09-25 DIAGNOSIS — R079 Chest pain, unspecified: Secondary | ICD-10-CM

## 2020-09-25 LAB — GLUCOSE, CAPILLARY
Glucose-Capillary: 116 mg/dL — ABNORMAL HIGH (ref 70–99)
Glucose-Capillary: 112 mg/dL — ABNORMAL HIGH (ref 70–99)
Glucose-Capillary: 161 mg/dL — ABNORMAL HIGH (ref 70–99)

## 2020-09-25 LAB — CBC
HCT: 32.7 % — ABNORMAL LOW (ref 36.0–46.0)
Hemoglobin: 10.9 g/dL — ABNORMAL LOW (ref 12.0–15.0)
MCH: 29.3 pg (ref 26.0–34.0)
MCHC: 33.3 g/dL (ref 30.0–36.0)
MCV: 87.9 fL (ref 80.0–100.0)
Platelets: 100 10*3/uL — ABNORMAL LOW (ref 150–400)
RBC: 3.72 MIL/uL — ABNORMAL LOW (ref 3.87–5.11)
RDW: 13 % (ref 11.5–15.5)
WBC: 5.6 10*3/uL (ref 4.0–10.5)
nRBC: 0 % (ref 0.0–0.2)

## 2020-09-25 MED ORDER — GADOBUTROL 1 MMOL/ML IV SOLN
8.0000 mL | Freq: Once | INTRAVENOUS | Status: AC | PRN
  Administered 2020-09-25: 8 mL via INTRAVENOUS

## 2020-09-25 MED ORDER — IRBESARTAN 75 MG PO TABS
75.0000 mg | ORAL_TABLET | Freq: Every day | ORAL | 3 refills | Status: DC
  Filled 2020-09-25: qty 30, 30d supply, fill #0

## 2020-09-25 MED ORDER — AMLODIPINE BESYLATE 10 MG PO TABS
10.0000 mg | ORAL_TABLET | Freq: Every day | ORAL | 3 refills | Status: DC
  Filled 2020-09-25: qty 30, 30d supply, fill #0

## 2020-09-25 MED ORDER — METFORMIN HCL ER 500 MG PO TB24
1000.0000 mg | ORAL_TABLET | Freq: Two times a day (BID) | ORAL | 3 refills | Status: AC
  Filled 2020-09-25: qty 120, 30d supply, fill #0

## 2020-09-25 MED ORDER — ASPIRIN 81 MG PO CHEW
81.0000 mg | CHEWABLE_TABLET | Freq: Every day | ORAL | 3 refills | Status: DC
  Filled 2020-09-25: qty 30, 30d supply, fill #0

## 2020-09-25 MED ORDER — CARVEDILOL 12.5 MG PO TABS
12.5000 mg | ORAL_TABLET | Freq: Two times a day (BID) | ORAL | 3 refills | Status: DC
  Filled 2020-09-25: qty 60, 30d supply, fill #0

## 2020-09-25 MED ORDER — CARVEDILOL 12.5 MG PO TABS: 12.5000 mg | ORAL_TABLET | Freq: Two times a day (BID) | ORAL | Status: DC

## 2020-09-25 NOTE — Progress Notes (Addendum)
Progress Note  Patient Name: [name] Date of Encounter: 09/25/2020  [facility] Cardiologist: Jodelle Red, MD   Subjective   No recurrent chest pain. Breathing is fine today. No new complaints overnight.   Inpatient Medications    Scheduled Meds:  amLODipine  10 mg Oral Daily   aspirin  81 mg Oral Daily   carvedilol  6.25 mg Oral BID WC   gabapentin  300 mg Oral TID   insulin aspart  0-6 Units Subcutaneous Q4H   irbesartan  75 mg Oral Daily   levothyroxine  150 mcg Oral QODAY   And   levothyroxine  75 mcg Oral QODAY   rosuvastatin  20 mg Oral Daily   sodium chloride flush  3 mL Intravenous Q12H   Continuous Infusions:  sodium chloride     PRN Meds: sodium chloride, acetaminophen, hydrALAZINE, nitroGLYCERIN, ondansetron (ZOFRAN) IV, sodium chloride flush, zolpidem   Vital Signs    Vitals:   09/24/20 1633 09/24/20 2055 09/25/20 0443 09/25/20 0840  BP: (!) 166/51 (!) 165/57 (!) 156/52 (!) 170/56  Pulse: 87 88 84   Resp:  16 17   Temp: 98.6 F (37 C) 98.5 F (36.9 C) 98.2 F (36.8 C) 97.8 F (36.6 C)  TempSrc: Oral Oral Oral Oral  SpO2: 97% 98% 94%   Weight:      Height:        Intake/Output Summary (Last 24 hours) at 09/25/2020 0955 Last data filed at 09/24/2020 1600 Gross per 24 hour  Intake --  Output 1000 ml  Net -1000 ml   Last 3 Weights 09/22/2020 06/10/2020 02/23/2018  Weight (lbs) 180 lb 175 lb 178 lb  Weight (kg) 81.647 kg 79.379 kg 80.74 kg      Telemetry    Sinus rhythm with occasional PVCs - Personally Reviewed  ECG    No new tracings - Personally Reviewed  Physical Exam   GEN: No acute distress.   Neck: No JVD Cardiac: RRR, + murmurs, no rubs or gallops.  Respiratory: Clear to auscultation bilaterally. GI: Soft, obese, nontender, non-distended  MS: No edema; No deformity. Neuro:  Nonfocal  Psych: Normal affect   Labs    High Sensitivity Troponin:   Recent Labs  Lab 09/22/20 2204 09/22/20 2322 09/23/20 0210  09/23/20 0328 09/23/20 0643  TROPONINIHS 17 34* 151* 196* 374*      Chemistry Recent Labs  Lab 09/22/20 2204 09/24/20 0212  NA 135 139  K 3.9 3.8  CL 104 107  CO2 25 22  GLUCOSE 376* 94  BUN 16 11  CREATININE 0.80 0.62  CALCIUM 9.6 9.0  GFRNONAA >60 >60  ANIONGAP 6 10     Hematology Recent Labs  Lab 09/22/20 2204 09/24/20 0212 09/25/20 0200  WBC 4.8 4.3 5.6  RBC 4.42 3.59* 3.72*  HGB 13.0 10.7* 10.9*  HCT 38.6 32.3* 32.7*  MCV 87.3 90.0 87.9  MCH 29.4 29.8 29.3  MCHC 33.7 33.1 33.3  RDW 12.7 13.2 13.0  PLT 116* 96* 100*    BNP Recent Labs  Lab 09/22/20 2322  BNP 179.2*     DDimer  Recent Labs  Lab 09/22/20 2322  DDIMER 0.40     Radiology    CARDIAC CATHETERIZATION  Result Date: 09/23/2020   Prox Cx lesion is 20% stenosed. Mild nonobstructive CAD with tortuous vessels and smooth 20% narrowing in the proximal circumflex far artery.  The LAD appears angiographically normal.  The right coronary artery is a dominant vessel with  superior takeoff. Mild LV to aorta gradient on pullback with a peak to peak gradient at approximately 16 mmHg. RECOMMENDATION: Medical therapy.   ECHOCARDIOGRAM COMPLETE  Result Date: 09/23/2020    ECHOCARDIOGRAM REPORT   Patient Name:   [name] [facility] Date of Exam: 09/23/2020 Medical Rec #:  [id number]    Height:       62.0 in Accession #:    [id number]   Weight:       180.0 lb Date of Birth:  1950/03/08    BSA:          1.828 m Patient Age:    70 years     BP:           111/65 mmHg Patient Gender: F            HR:           76 bpm. Exam Location:  Inpatient Procedure: 2D Echo, 3D Echo, Cardiac Doppler, Color Doppler and Strain Analysis Indications:    122-I22.9 Subsequent ST elevation (STEM) and non-ST elevation                 (NSTEMI) myocardial infarction  History:        Patient has prior history of Echocardiogram examinations, most                 recent 02/23/2018. Mitral Valve Disease, Signs/Symptoms:Murmur;                 Risk  Factors:Hypertension and Diabetes.  Sonographer:    Sheralyn Boatman RDCS Referring Phys: [id number] Corrin Parker  Sonographer Comments: Technically difficult study due to poor echo windows. IMPRESSIONS  1. Left ventricular ejection fraction, by estimation, is 65 to 70%. The left ventricle has normal function. The left ventricle has no regional wall motion abnormalities. Left ventricular diastolic parameters are consistent with Grade II diastolic dysfunction (pseudonormalization).  2. Right ventricular systolic function is normal. The right ventricular size is normal.  3. Left atrial size was mildly dilated.  4. There is systolic anterior motion of the MV leaflet. . The mitral valve is normal in structure. Moderate mitral valve regurgitation.  5. The aortic valve is normal in structure. Aortic valve regurgitation is not visualized. FINDINGS  Left Ventricle: Left ventricular ejection fraction, by estimation, is 65 to 70%. The left ventricle has normal function. The left ventricle has no regional wall motion abnormalities. The left ventricular internal cavity size was normal in size. There is  borderline left ventricular hypertrophy. Left ventricular diastolic parameters are consistent with Grade II diastolic dysfunction (pseudonormalization). Right Ventricle: The right ventricular size is normal. Right vetricular wall thickness was not well visualized. Right ventricular systolic function is normal. Left Atrium: Left atrial size was mildly dilated. Right Atrium: Right atrial size was normal in size. Pericardium: There is no evidence of pericardial effusion. Mitral Valve: There is systolic anterior motion of the MV leaflet. The mitral valve is normal in structure. Moderate mitral valve regurgitation. Tricuspid Valve: The tricuspid valve is grossly normal. Tricuspid valve regurgitation is trivial. Aortic Valve: The aortic valve is normal in structure. Aortic valve regurgitation is not visualized. Pulmonic Valve: The pulmonic  valve was grossly normal. Pulmonic valve regurgitation is not visualized. Aorta: The aortic root and ascending aorta are structurally normal, with no evidence of dilitation. IAS/Shunts: The atrial septum is grossly normal.  LEFT VENTRICLE PLAX 2D LVIDd:         4.60 cm     Diastology LVIDs:  2.30 cm     LV e' medial:    6.31 cm/s LV PW:         1.20 cm     LV E/e' medial:  14.5 LV IVS:        1.10 cm     LV e' lateral:   6.09 cm/s LVOT diam:     1.60 cm     LV E/e' lateral: 15.1 LV SV:         67 LV SV Index:   37 LVOT Area:     2.01 cm  LV Volumes (MOD) LV vol d, MOD A2C: 45.7 ml LV vol d, MOD A4C: 78.2 ml LV vol s, MOD A2C: 17.1 ml LV vol s, MOD A4C: 18.1 ml LV SV MOD A2C:     28.6 ml LV SV MOD A4C:     78.2 ml LV SV MOD BP:      44.8 ml RIGHT VENTRICLE             IVC RV S prime:     12.10 cm/s  IVC diam: 3.00 cm TAPSE (M-mode): 1.9 cm LEFT ATRIUM             Index       RIGHT ATRIUM           Index LA diam:        3.80 cm 2.08 cm/m  RA Area:     13.20 cm LA Vol (A2C):   71.1 ml 38.90 ml/m RA Volume:   31.10 ml  17.01 ml/m LA Vol (A4C):   67.3 ml 36.82 ml/m LA Biplane Vol: 72.8 ml 39.83 ml/m  AORTIC VALVE LVOT Vmax:   182.00 cm/s LVOT Vmean:  114.000 cm/s LVOT VTI:    0.333 m  AORTA Ao Root diam: 3.00 cm Ao Asc diam:  2.90 cm MITRAL VALVE MV Area (PHT): 3.27 cm      SHUNTS MV Decel Time: 232 msec      Systemic VTI:  0.33 m MR Peak grad:    138.3 mmHg  Systemic Diam: 1.60 cm MR Mean grad:    79.0 mmHg MR Vmax:         588.00 cm/s MR Vmean:        396.0 cm/s MR PISA:         1.01 cm MR PISA Eff ROA: 7 mm MR PISA Radius:  0.40 cm MV E velocity: 91.80 cm/s MV A velocity: 95.50 cm/s MV E/A ratio:  0.96 Kristeen Miss MD Electronically signed by Kristeen Miss MD Signature Date/Time: 09/23/2020/2:41:21 PM    Final     Cardiac Studies   Echocardiogram 09/23/20: 1. Left ventricular ejection fraction, by estimation, is 65 to 70%. The  left ventricle has normal function. The left ventricle has no regional   wall motion abnormalities. Left ventricular diastolic parameters are  consistent with Grade II diastolic  dysfunction (pseudonormalization).   2. Right ventricular systolic function is normal. The right ventricular  size is normal.   3. Left atrial size was mildly dilated.   4. There is systolic anterior motion of the MV leaflet. . The mitral  valve is normal in structure. Moderate mitral valve regurgitation.   5. The aortic valve is normal in structure. Aortic valve regurgitation is  not visualized.   LHC 09/23/20: Prox Cx lesion is 20% stenosed.   Mild nonobstructive CAD with tortuous vessels and smooth 20% narrowing in the proximal circumflex far artery.  The LAD appears angiographically normal.  The right coronary artery is a dominant vessel with superior takeoff.   Mild LV to aorta gradient on pullback with a peak to peak gradient at approximately 16 mmHg.   RECOMMENDATION: Medical therapy.    Patient Profile     70 y.o. female with a PMH of HTN, HLD, DM type 2, LVT obstruction, mitral regurgitation, and sarcoidosis, who is being followed by cardiology for the evaluation of chest pain  Assessment & Plan    1. Non-obstructive CAD: patient presented with chest pain and markedly elevated BP. HsTrop peaked at 375. EKG showed chronic STD V3-6 but no acute ischemic changes. She had an echocardiogram which showed EF 65-70%, G2DD, no RWMA, normal RV size/function, mild LAE, and SAM with moderate MR. She underwent a LHC which showed 20% pLCx stenosis, otherwise normal coronary arteries.  - Continue aggressive risk factor modifications to promote goal BP<130/80, LDL <70, A1C <7 - Continue aspirin and BBlocker  2. Hypertensive urgency: SBP elevated to 210 on arrival, though patient reported good control at home. She was started on carvedilol uptitrated to 6.25, in addition to home amlodipine and irbesartan (without HCTZ). BP remains significantly elevated.  - Will uptitrate carvedilol to  12.5mg  BID today though may still require further adjustments - room to increase irbesartan or carvedilol  3. LVOT/LVH/SAM: patient noted to have SAM with moderate MR on echo and c/f LVOT. She has family history of SCD in brother in his 4150s. Also history of sarcoidosis. She underwent cardiac MR this morning to further evaluate - Will await cardiac MR results  4. HLD: LDL 63 this admission - Continue crestor  5. DM type 2: A1C 5.8 this admission. On metformin at home - Continue management per primary team       For questions or updates, please contact [facility] Please consult [email or web address] for contact info under        Signed, Beatriz StallionKrista M. Kroeger, PA-C  09/25/2020, 9:55 AM     Personally seen and examined. Agree with APP above with the following comments: - reviewed MRI in brief:  no significant LGE qualitatively; thickness appears ~ 15 mm in septum. - BP has improved with the Coreg; will work to maximize this medication - can additionally add diltiazem or verapamil  - patient to bring amb BP cuff to clinic visit  - will send copy of CMR results to primary cardiologist  Riley LamMahesh Tajanay Hurley, MD Cardiologist [facility]  [facility]  [address] [place], [place] [address] [phone]  1:58 PM

## 2020-09-25 NOTE — Progress Notes (Signed)
[facility] PROGRESS NOTE    Progress Note  [name]  GHW:[id number] DOB: 06-24-1950 DOA: 09/22/2020 PCP: Ralene Ok, MD     Brief Narrative:   [name] is an 70 y.o. female past medical history significant for essential hypertension, diabetes mellitus type 2 sarcoid diagnosed in 2010 presents to the ED for evaluation of chest pain she also started having new onset bilateral foot swelling she had a low normal nuclear stress test in 2020 and an echocardiogram with a preserved EF, diastolic dysfunction issues found to have a systolic blood pressure of 210 EKG shows sinus tachycardia chest x-ray showed bilateral opacities.   Assessment/Plan:   Atypical chest pain Chest pain relieved by nitro no exacerbating factors. Cardiology was consulted recommended aspirin, heparin and statin along with a left heart cath showed mild coronary artery disease recommended conservative management.. Continue aspirin and statins LDL is at goal. Stop hydrochlorothiazide increase beta-blocker Cardiac MRI still pending.  Awaiting cards further recommendations.  Essential hypertension: Increase ARB, continue Coreg and amlodipine hold hydrochlorothiazide and continue hydralazine.  Diabetes mellitus type 2: A1c 8.8 she was given 2 units of insulin her blood glucose now is 104.  Continue CBGs before meals and at bedtime.  Chronic diastolic heart failure: She is not in acute decompensated heart failure continue Lasix at current home dose.  Thrombocytopenia: Has been intermittently low in the past. Now off heparin continue to monitor.  Hypothyroidism Continue Synthroid.  Cervical radiculopathy: Noted follow-up with PCP as an outpatient.  DVT prophylaxis: lovenox Family Communication:none Status is: Observation  The patient remains OBS appropriate and will d/c before 2 midnights.  Dispo: The patient is from: Home              Anticipated d/c is to: Home              Patient currently  is not medically stable to d/c.   Difficult to place patient No        Code Status:     Code Status Orders  (From admission, onward)           Start     Ordered   09/23/20 0144  Full code  Continuous        09/23/20 0145           Code Status History     Date Active Date Inactive Code Status Order ID Comments User Context   06/22/2012 1842 06/23/2012 1903 Full Code [id number]  Laveda Norman, MD Inpatient         IV Access:   Peripheral IV   Procedures and diagnostic studies:   CARDIAC CATHETERIZATION  Result Date: 09/23/2020   Prox Cx lesion is 20% stenosed. Mild nonobstructive CAD with tortuous vessels and smooth 20% narrowing in the proximal circumflex far artery.  The LAD appears angiographically normal.  The right coronary artery is a dominant vessel with superior takeoff. Mild LV to aorta gradient on pullback with a peak to peak gradient at approximately 16 mmHg. RECOMMENDATION: Medical therapy.   ECHOCARDIOGRAM COMPLETE  Result Date: 09/23/2020    ECHOCARDIOGRAM REPORT   [facility]   [name] Date of Exam: 09/23/2020 Medical Rec #:  [id number]    Height:       62.0 in Accession #:    [id number]   Weight:       180.0 lb Date of Birth:  1950-10-23    BSA:          1.828  m Patient Age:    70 years     BP:           111/65 mmHg Patient Gender: F            HR:           76 bpm. Exam Location:  Inpatient Procedure: 2D Echo, 3D Echo, Cardiac Doppler, Color Doppler and Strain Analysis Indications:    122-I22.9 Subsequent ST elevation (STEM) and non-ST elevation                 (NSTEMI) myocardial infarction  History:        Patient has prior history of Echocardiogram examinations, most                 recent 02/23/2018. Mitral Valve Disease, Signs/Symptoms:Murmur;                 Risk Factors:Hypertension and Diabetes.  Sonographer:    Sheralyn Boatman RDCS Referring Phys: [id number] Corrin Parker  Sonographer Comments: Technically difficult study due to poor echo windows.  IMPRESSIONS  1. Left ventricular ejection fraction, by estimation, is 65 to 70%. The left ventricle has normal function. The left ventricle has no regional wall motion abnormalities. Left ventricular diastolic parameters are consistent with Grade II diastolic dysfunction (pseudonormalization).  2. Right ventricular systolic function is normal. The right ventricular size is normal.  3. Left atrial size was mildly dilated.  4. There is systolic anterior motion of the MV leaflet. . The mitral valve is normal in structure. Moderate mitral valve regurgitation.  5. The aortic valve is normal in structure. Aortic valve regurgitation is not visualized. FINDINGS  Left Ventricle: Left ventricular ejection fraction, by estimation, is 65 to 70%. The left ventricle has normal function. The left ventricle has no regional wall motion abnormalities. The left ventricular internal cavity size was normal in size. There is  borderline left ventricular hypertrophy. Left ventricular diastolic parameters are consistent with Grade II diastolic dysfunction (pseudonormalization). Right Ventricle: The right ventricular size is normal. Right vetricular wall thickness was not well visualized. Right ventricular systolic function is normal. Left Atrium: Left atrial size was mildly dilated. Right Atrium: Right atrial size was normal in size. Pericardium: There is no evidence of pericardial effusion. Mitral Valve: There is systolic anterior motion of the MV leaflet. The mitral valve is normal in structure. Moderate mitral valve regurgitation. Tricuspid Valve: The tricuspid valve is grossly normal. Tricuspid valve regurgitation is trivial. Aortic Valve: The aortic valve is normal in structure. Aortic valve regurgitation is not visualized. Pulmonic Valve: The pulmonic valve was grossly normal. Pulmonic valve regurgitation is not visualized. Aorta: The aortic root and ascending aorta are structurally normal, with no evidence of dilitation. IAS/Shunts:  The atrial septum is grossly normal.  LEFT VENTRICLE PLAX 2D LVIDd:         4.60 cm     Diastology LVIDs:         2.30 cm     LV e' medial:    6.31 cm/s LV PW:         1.20 cm     LV E/e' medial:  14.5 LV IVS:        1.10 cm     LV e' lateral:   6.09 cm/s LVOT diam:     1.60 cm     LV E/e' lateral: 15.1 LV SV:         67 LV SV Index:   37 LVOT Area:  2.01 cm  LV Volumes (MOD) LV vol d, MOD A2C: 45.7 ml LV vol d, MOD A4C: 78.2 ml LV vol s, MOD A2C: 17.1 ml LV vol s, MOD A4C: 18.1 ml LV SV MOD A2C:     28.6 ml LV SV MOD A4C:     78.2 ml LV SV MOD BP:      44.8 ml RIGHT VENTRICLE             IVC RV S prime:     12.10 cm/s  IVC diam: 3.00 cm TAPSE (M-mode): 1.9 cm LEFT ATRIUM             Index       RIGHT ATRIUM           Index LA diam:        3.80 cm 2.08 cm/m  RA Area:     13.20 cm LA Vol (A2C):   71.1 ml 38.90 ml/m RA Volume:   31.10 ml  17.01 ml/m LA Vol (A4C):   67.3 ml 36.82 ml/m LA Biplane Vol: 72.8 ml 39.83 ml/m  AORTIC VALVE LVOT Vmax:   182.00 cm/s LVOT Vmean:  114.000 cm/s LVOT VTI:    0.333 m  AORTA Ao Root diam: 3.00 cm Ao Asc diam:  2.90 cm MITRAL VALVE MV Area (PHT): 3.27 cm      SHUNTS MV Decel Time: 232 msec      Systemic VTI:  0.33 m MR Peak grad:    138.3 mmHg  Systemic Diam: 1.60 cm MR Mean grad:    79.0 mmHg MR Vmax:         588.00 cm/s MR Vmean:        396.0 cm/s MR PISA:         1.01 cm MR PISA Eff ROA: 7 mm MR PISA Radius:  0.40 cm MV E velocity: 91.80 cm/s MV A velocity: 95.50 cm/s MV E/A ratio:  0.96 Kristeen Miss MD Electronically signed by Kristeen Miss MD Signature Date/Time: 09/23/2020/2:41:21 PM    Final      Medical Consultants:   None.   Subjective:    [name] denies any chest pain  Objective:    Vitals:   09/24/20 2055 09/25/20 0443 09/25/20 0840 09/25/20 0900  BP: (!) 165/57 (!) 156/52 (!) 170/56 (!) 121/48  Pulse: 88 84  76  Resp: 16 17  17   Temp: 98.5 F (36.9 C) 98.2 F (36.8 C) 97.8 F (36.6 C) 97.8 F (36.6 C)  TempSrc: Oral Oral Oral Oral   SpO2: 98% 94%  98%  Weight:      Height:       SpO2: 98 % O2 Flow Rate (L/min): 2 L/min   Intake/Output Summary (Last 24 hours) at 09/25/2020 1110 Last data filed at 09/24/2020 1600 Gross per 24 hour  Intake --  Output 1000 ml  Net -1000 ml    Filed Weights   09/22/20 2211  Weight: 81.6 kg    Exam: General exam: In no acute distress. Respiratory system: Good air movement and clear to auscultation. Cardiovascular system: S1 & S2 heard, RRR. No JVD. Gastrointestinal system: Abdomen is nondistended, soft and nontender.  Extremities: No pedal edema. Skin: No rashes, lesions or ulcers Psychiatry: Judgement and insight appear normal. Mood & affect appropriate.   Data Reviewed:    Labs: Basic Metabolic Panel: Recent Labs  Lab 09/22/20 2204 09/24/20 0212  NA 135 139  K 3.9 3.8  CL 104 107  CO2 25  22  GLUCOSE 376* 94  BUN 16 11  CREATININE 0.80 0.62  CALCIUM 9.6 9.0    GFR Estimated Creatinine Clearance: 62.9 mL/min (by C-G formula based on SCr of 0.62 mg/dL). Liver Function Tests: No results for input(s): AST, ALT, ALKPHOS, BILITOT, PROT, ALBUMIN in the last 168 hours. No results for input(s): LIPASE, AMYLASE in the last 168 hours. No results for input(s): AMMONIA in the last 168 hours. Coagulation profile Recent Labs  Lab 09/22/20 2322  INR 1.0    COVID-19 Labs  Recent Labs    09/22/20 2322  DDIMER 0.40     Lab Results  Component Value Date   SARSCOV2NAA NEGATIVE 09/23/2020    CBC: Recent Labs  Lab 09/22/20 2204 09/24/20 0212 09/25/20 0200  WBC 4.8 4.3 5.6  HGB 13.0 10.7* 10.9*  HCT 38.6 32.3* 32.7*  MCV 87.3 90.0 87.9  PLT 116* 96* 100*    Cardiac Enzymes: No results for input(s): CKTOTAL, CKMB, CKMBINDEX, TROPONINI in the last 168 hours. BNP (last 3 results) No results for input(s): PROBNP in the last 8760 hours. CBG: Recent Labs  Lab 09/24/20 2057 09/24/20 2356 09/25/20 0440 09/25/20 0900 09/25/20 1055  GLUCAP 166* 111*  116* 112* 161*    D-Dimer: Recent Labs    09/22/20 2322  DDIMER 0.40    Hgb A1c: Recent Labs    09/23/20 0210  HGBA1C 5.8*    Lipid Profile: Recent Labs    09/23/20 0210  CHOL 116  HDL 48  LDLCALC 63  TRIG 24  CHOLHDL 2.4    Thyroid function studies: No results for input(s): TSH, T4TOTAL, T3FREE, THYROIDAB in the last 72 hours.  Invalid input(s): FREET3 Anemia work up: No results for input(s): VITAMINB12, FOLATE, FERRITIN, TIBC, IRON, RETICCTPCT in the last 72 hours. Sepsis Labs: Recent Labs  Lab 09/22/20 2204 09/24/20 0212 09/25/20 0200  WBC 4.8 4.3 5.6    Microbiology Recent Results (from the past 240 hour(s))  Resp Panel by RT-PCR (Flu A&B, Covid) Nasopharyngeal Swab     Status: None   Collection Time: 09/23/20 12:45 AM   Specimen: Nasopharyngeal Swab; Nasopharyngeal(NP) swabs in vial transport medium  Result Value Ref Range Status   SARS Coronavirus 2 by RT PCR NEGATIVE NEGATIVE Final    Comment: (NOTE) SARS-CoV-2 target nucleic acids are NOT DETECTED.  The SARS-CoV-2 RNA is generally detectable in upper respiratory specimens during the acute phase of infection. The lowest concentration of SARS-CoV-2 viral copies this assay can detect is 138 copies/mL. A negative result does not preclude SARS-Cov-2 infection and should not be used as the sole basis for treatment or other patient management decisions. A negative result may occur with  improper specimen collection/handling, submission of specimen other than nasopharyngeal swab, presence of viral mutation(s) within the areas targeted by this assay, and inadequate number of viral copies(<138 copies/mL). A negative result must be combined with clinical observations, patient history, and epidemiological information. The expected result is Negative.  Fact Sheet for Patients:  BloggerCourse.comhttps://www.fda.gov/media/152166/download  Fact Sheet for Healthcare Providers:   SeriousBroker.ithttps://www.fda.gov/media/152162/download  This test is no t yet approved or cleared by the [place] FDA and  has been authorized for detection and/or diagnosis of SARS-CoV-2 by FDA under an Emergency Use Authorization (EUA). This EUA will remain  in effect (meaning this test can be used) for the duration of the COVID-19 declaration under Section 564(b)(1) of the Act, 21 U.S.C.section 360bbb-3(b)(1), unless the authorization is terminated  or revoked sooner.  Influenza A by PCR NEGATIVE NEGATIVE Final   Influenza B by PCR NEGATIVE NEGATIVE Final    Comment: (NOTE) The Xpert Xpress SARS-CoV-2/FLU/RSV plus assay is intended as an aid in the diagnosis of influenza from Nasopharyngeal swab specimens and should not be used as a sole basis for treatment. Nasal washings and aspirates are unacceptable for Xpert Xpress SARS-CoV-2/FLU/RSV testing.  Fact Sheet for Patients: BloggerCourse.com  Fact Sheet for Healthcare Providers: SeriousBroker.it  This test is not yet approved or cleared by the [place] FDA and has been authorized for detection and/or diagnosis of SARS-CoV-2 by FDA under an Emergency Use Authorization (EUA). This EUA will remain in effect (meaning this test can be used) for the duration of the COVID-19 declaration under Section 564(b)(1) of the Act, 21 U.S.C. section 360bbb-3(b)(1), unless the authorization is terminated or revoked.  Performed at [facility], [address] [place], [place] [address]      Medications:    amLODipine  10 mg Oral Daily   aspirin  81 mg Oral Daily   carvedilol  12.5 mg Oral BID WC   gabapentin  300 mg Oral TID   insulin aspart  0-6 Units Subcutaneous Q4H   irbesartan  75 mg Oral Daily   levothyroxine  150 mcg Oral QODAY   And   levothyroxine  75 mcg Oral QODAY   rosuvastatin  20 mg Oral Daily   sodium chloride flush  3 mL Intravenous Q12H    Continuous Infusions:  sodium chloride        LOS: 1 day   Marinda Elk  [facility]  09/25/2020, 11:10 AM

## 2020-09-25 NOTE — Discharge Summary (Addendum)
Physician Discharge Summary  [name] WNU:[id number] DOB: 1950/04/02 DOA: 09/22/2020  PCP: Ralene Ok, MD  Admit date: 09/22/2020 Discharge date: 09/25/2020  Admitted From: Home  \Disposition:  Home   Recommendations for Outpatient Follow-up:  Follow up with Cards in 1-2 weeks titrate antihypertensive medication and heart failure medications as needed. Please obtain BMP/CBC in one week   Home Health:NO  Equipment/Devices:None   Discharge Condition:Stable  CODE STATUS:Full Diet recommendation: Heart Healthy   Brief/Interim Summary: 70 y.o. female past medical history significant for essential hypertension, diabetes mellitus type 2 sarcoid diagnosed in 2010 presents to the ED for evaluation of chest pain she also started having new onset bilateral foot swelling she had a low normal nuclear stress test in 2020 and an echocardiogram with a preserved EF, diastolic dysfunction issues found to have a systolic blood pressure of 210 EKG shows sinus tachycardia chest x-ray showed bilateral opacities.  Discharge Diagnoses:  Principal Problem:   Chest pain Active Problems:   Hypothyroidism   HTN (hypertension)   Uncontrolled type 2 diabetes mellitus with hyperglycemia (HCC)   Thrombocytopenia (HCC)  Atypical chest pain: Chest pain was relieved by nitroglycerin with no exacerbating factors. Cardiology was consulted recommended aspirin, heparin and statin left heart cath was done that showed mild coronary artery disease they recommended conservative management. She was continued on aspirin and statin LDL was at goal. Hydrochlorothiazide was stopped, beta-blocker was increased. Cardiology recommended an MRI that was done that showed no scarring the recommended titration of her antihypertensive medication as an outpatient.  Essential hypertension: She was continued on Coreg amlodipine, her ARB was titrated up, she also continue her Lasix we have discontinued hydrochlorothiazide. We  have continued her on her current dose of hydralazine.  She will follow-up with her cardiology and titrate her antihypertensive medication as an outpatient. We have also advised her not to take ibuprofen.  Diabetes mellitus type 2: Her A1c was 8.8, her metformin was titrated 2000 mg p.o. twice daily she will follow-up with PCP and titrate hypoglycemic agents as needed as an outpatient.     Chronic diastolic heart failure: She is not in acute decompensated heart failure: She will continue Lasix at her current dose, heart failure medications have been titrated. Her hydrochlorothiazide has been discontinued.  Chronic thrombocytopenia: It has been intermittently low in the past just borderline.  Will need to follow-up with PCP as an outpatient.  Hypothyroidism: No change made to her medication continue Synthroid.  Cervical radiculopathy: Follow-up with PCP as an outpatient.    Discharge Instructions  Discharge Instructions     Diet - low sodium heart healthy   Complete by: As directed    Increase activity slowly   Complete by: As directed       Allergies as of 09/25/2020       Reactions   Sulfa Antibiotics Other (See Comments)   High fever Other reaction(s): Fever   Atorvastatin Other (See Comments)   Extreme muscle cramps and muscle aches   Amoxicillin Rash        Medication List     STOP taking these medications    clindamycin 300 MG capsule Commonly known as: CLEOCIN   ibuprofen 200 MG tablet Commonly known as: ADVIL   Olmesartan-amLODIPine-HCTZ 40-10-25 MG Tabs       TAKE these medications    acetaminophen 500 MG tablet Commonly known as: TYLENOL Take 500 mg by mouth every 6 (six) hours as needed for mild pain.   acyclovir 400 MG tablet  Commonly known as: ZOVIRAX Take 400 mg by mouth every 8 (eight) hours as needed. Fever blisters   amLODipine 10 MG tablet Commonly known as: NORVASC Take 1 tablet (10 mg total) by mouth daily. Start taking on:  September 26, 2020   aspirin 81 MG chewable tablet Chew and swallow 1 tablet (81 mg total) by mouth daily. Start taking on: September 26, 2020   calcium-vitamin D 500-200 MG-UNIT tablet Commonly known as: OSCAL WITH D Take 1 tablet by mouth daily with breakfast.   carvedilol 12.5 MG tablet Commonly known as: COREG Take 1 tablet (12.5 mg total) by mouth 2 (two) times daily with a meal.   cetirizine 10 MG tablet Commonly known as: ZYRTEC Take 10 mg by mouth daily as needed for allergies.   furosemide 20 MG tablet Commonly known as: LASIX Take 20 mg by mouth daily as needed for fluid or edema.   gabapentin 300 MG capsule Commonly known as: NEURONTIN Take 300 mg by mouth 3 (three) times daily.   hydrALAZINE 25 MG tablet Commonly known as: APRESOLINE Take 25 mg by mouth 2 (two) times daily.   irbesartan 75 MG tablet Commonly known as: AVAPRO Take 1 tablet (75 mg total) by mouth daily. Start taking on: September 26, 2020   levothyroxine 150 MCG tablet Commonly known as: SYNTHROID Take 75-150 mcg by mouth daily before breakfast. Pt alternates between 150 mcg and 75 mcg each day   LORazepam 1 MG tablet Commonly known as: ATIVAN Take 1 mg by mouth 3 (three) times daily as needed for anxiety.   metFORMIN 500 MG 24 hr tablet Commonly known as: GLUCOPHAGE-XR Take 500 mg by mouth daily with breakfast.   oxyCODONE-acetaminophen 5-325 MG tablet Commonly known as: Percocet Take 1-2 tablets by mouth every 6 (six) hours as needed. What changed: reasons to take this   Ozempic (1 MG/DOSE) 4 MG/3ML Sopn Generic drug: Semaglutide (1 MG/DOSE) Inject 1 mg into the skin once a week. Monday   rosuvastatin 10 MG tablet Commonly known as: CRESTOR Take 10 mg by mouth daily.   zolpidem 10 MG tablet Commonly known as: AMBIEN Take 10 mg by mouth at bedtime as needed for sleep.        Allergies  Allergen Reactions   Sulfa Antibiotics Other (See Comments)    High fever Other  reaction(s): Fever   Atorvastatin Other (See Comments)    Extreme muscle cramps and muscle aches   Amoxicillin Rash    Consultations: Cardiology   Procedures/Studies: DG Chest 2 View  Result Date: 09/22/2020 CLINICAL DATA:  Chest pain EXAM: CHEST - 2 VIEW COMPARISON:  Chest x-ray 02/07/2018 FINDINGS: The cardiomediastinal silhouette appears within normal limits. There is central interstitial prominence bilaterally. There also interstitial and patchy opacities in the left lung base. There is no pleural effusion or pneumothorax identified. No acute fractures are seen. There are surgical clips in the right abdomen. IMPRESSION: 1. Patchy and interstitial opacities in the left lung base worrisome for infection. Follow-up imaging recommended in 2 months or 2 menstrual cycles to confirm resolution. 2. Central interstitial prominence may be related to mild edema or infection/inflammation. Electronically Signed   By: Darliss Cheney M.D.   On: 09/22/2020 22:18   CARDIAC CATHETERIZATION  Result Date: 09/23/2020   Prox Cx lesion is 20% stenosed. Mild nonobstructive CAD with tortuous vessels and smooth 20% narrowing in the proximal circumflex far artery.  The LAD appears angiographically normal.  The right coronary artery is a dominant vessel with  superior takeoff. Mild LV to aorta gradient on pullback with a peak to peak gradient at approximately 16 mmHg. RECOMMENDATION: Medical therapy.   ECHOCARDIOGRAM COMPLETE  Result Date: 09/23/2020    ECHOCARDIOGRAM REPORT   Patient Name:   DENAJA VERHOEVEN [facility] Date of Exam: 09/23/2020 Medical Rec #:  [id number]    Height:       62.0 in Accession #:    [id number]   Weight:       180.0 lb Date of Birth:  21-Jul-1950    BSA:          1.828 m Patient Age:    70 years     BP:           111/65 mmHg Patient Gender: F            HR:           76 bpm. Exam Location:  Inpatient Procedure: 2D Echo, 3D Echo, Cardiac Doppler, Color Doppler and Strain Analysis Indications:    122-I22.9 Subsequent  ST elevation (STEM) and non-ST elevation                 (NSTEMI) myocardial infarction  History:        Patient has prior history of Echocardiogram examinations, most                 recent 02/23/2018. Mitral Valve Disease, Signs/Symptoms:Murmur;                 Risk Factors:Hypertension and Diabetes.  Sonographer:    Sheralyn Boatman RDCS Referring Phys: [id number] Corrin Parker  Sonographer Comments: Technically difficult study due to poor echo windows. IMPRESSIONS  1. Left ventricular ejection fraction, by estimation, is 65 to 70%. The left ventricle has normal function. The left ventricle has no regional wall motion abnormalities. Left ventricular diastolic parameters are consistent with Grade II diastolic dysfunction (pseudonormalization).  2. Right ventricular systolic function is normal. The right ventricular size is normal.  3. Left atrial size was mildly dilated.  4. There is systolic anterior motion of the MV leaflet. . The mitral valve is normal in structure. Moderate mitral valve regurgitation.  5. The aortic valve is normal in structure. Aortic valve regurgitation is not visualized. FINDINGS  Left Ventricle: Left ventricular ejection fraction, by estimation, is 65 to 70%. The left ventricle has normal function. The left ventricle has no regional wall motion abnormalities. The left ventricular internal cavity size was normal in size. There is  borderline left ventricular hypertrophy. Left ventricular diastolic parameters are consistent with Grade II diastolic dysfunction (pseudonormalization). Right Ventricle: The right ventricular size is normal. Right vetricular wall thickness was not well visualized. Right ventricular systolic function is normal. Left Atrium: Left atrial size was mildly dilated. Right Atrium: Right atrial size was normal in size. Pericardium: There is no evidence of pericardial effusion. Mitral Valve: There is systolic anterior motion of the MV leaflet. The mitral valve is normal in  structure. Moderate mitral valve regurgitation. Tricuspid Valve: The tricuspid valve is grossly normal. Tricuspid valve regurgitation is trivial. Aortic Valve: The aortic valve is normal in structure. Aortic valve regurgitation is not visualized. Pulmonic Valve: The pulmonic valve was grossly normal. Pulmonic valve regurgitation is not visualized. Aorta: The aortic root and ascending aorta are structurally normal, with no evidence of dilitation. IAS/Shunts: The atrial septum is grossly normal.  LEFT VENTRICLE PLAX 2D LVIDd:         4.60 cm     Diastology LVIDs:  2.30 cm     LV e' medial:    6.31 cm/s LV PW:         1.20 cm     LV E/e' medial:  14.5 LV IVS:        1.10 cm     LV e' lateral:   6.09 cm/s LVOT diam:     1.60 cm     LV E/e' lateral: 15.1 LV SV:         67 LV SV Index:   37 LVOT Area:     2.01 cm  LV Volumes (MOD) LV vol d, MOD A2C: 45.7 ml LV vol d, MOD A4C: 78.2 ml LV vol s, MOD A2C: 17.1 ml LV vol s, MOD A4C: 18.1 ml LV SV MOD A2C:     28.6 ml LV SV MOD A4C:     78.2 ml LV SV MOD BP:      44.8 ml RIGHT VENTRICLE             IVC RV S prime:     12.10 cm/s  IVC diam: 3.00 cm TAPSE (M-mode): 1.9 cm LEFT ATRIUM             Index       RIGHT ATRIUM           Index LA diam:        3.80 cm 2.08 cm/m  RA Area:     13.20 cm LA Vol (A2C):   71.1 ml 38.90 ml/m RA Volume:   31.10 ml  17.01 ml/m LA Vol (A4C):   67.3 ml 36.82 ml/m LA Biplane Vol: 72.8 ml 39.83 ml/m  AORTIC VALVE LVOT Vmax:   182.00 cm/s LVOT Vmean:  114.000 cm/s LVOT VTI:    0.333 m  AORTA Ao Root diam: 3.00 cm Ao Asc diam:  2.90 cm MITRAL VALVE MV Area (PHT): 3.27 cm      SHUNTS MV Decel Time: 232 msec      Systemic VTI:  0.33 m MR Peak grad:    138.3 mmHg  Systemic Diam: 1.60 cm MR Mean grad:    79.0 mmHg MR Vmax:         588.00 cm/s MR Vmean:        396.0 cm/s MR PISA:         1.01 cm MR PISA Eff ROA: 7 mm MR PISA Radius:  0.40 cm MV E velocity: 91.80 cm/s MV A velocity: 95.50 cm/s MV E/A ratio:  0.96 Kristeen Miss MD  Electronically signed by Kristeen Miss MD Signature Date/Time: 09/23/2020/2:41:21 PM    Final    (Echo, Carotid, EGD, Colonoscopy, ERCP)    Subjective: No complains  Discharge Exam: Vitals:   09/25/20 0900 09/25/20 1200  BP: (!) 121/48 (!) 148/51  Pulse: 76 77  Resp: 17 16  Temp: 97.8 F (36.6 C) 98.4 F (36.9 C)  SpO2: 98%    Vitals:   09/25/20 0443 09/25/20 0840 09/25/20 0900 09/25/20 1200  BP: (!) 156/52 (!) 170/56 (!) 121/48 (!) 148/51  Pulse: 84  76 77  Resp: Temp: 98.2 F (36.8 C) 97.8 F (36.6 C) 97.8 F (36.6 C) 98.4 F (36.9 C)  TempSrc: Oral Oral Oral Oral  SpO2: 94%  98%   Weight:      Height:        General: Pt is alert, awake, not in acute distress Cardiovascular: RRR, S1/S2 +, no rubs, no gallops Respiratory: CTA bilaterally, no  wheezing, no rhonchi Abdominal: Soft, NT, ND, bowel sounds + Extremities: no edema, no cyanosis    The results of significant diagnostics from this hospitalization (including imaging, microbiology, ancillary and laboratory) are listed below for reference.     Microbiology: Recent Results (from the past 240 hour(s))  Resp Panel by RT-PCR (Flu A&B, Covid) Nasopharyngeal Swab     Status: None   Collection Time: 09/23/20 12:45 AM   Specimen: Nasopharyngeal Swab; Nasopharyngeal(NP) swabs in vial transport medium  Result Value Ref Range Status   SARS Coronavirus 2 by RT PCR NEGATIVE NEGATIVE Final    Comment: (NOTE) SARS-CoV-2 target nucleic acids are NOT DETECTED.  The SARS-CoV-2 RNA is generally detectable in upper respiratory specimens during the acute phase of infection. The lowest concentration of SARS-CoV-2 viral copies this assay can detect is 138 copies/mL. A negative result does not preclude SARS-Cov-2 infection and should not be used as the sole basis for treatment or other patient management decisions. A negative result may occur with  improper specimen collection/handling, submission of specimen  other than nasopharyngeal swab, presence of viral mutation(s) within the areas targeted by this assay, and inadequate number of viral copies(<138 copies/mL). A negative result must be combined with clinical observations, patient history, and epidemiological information. The expected result is Negative.  Fact Sheet for Patients:  BloggerCourse.comhttps://www.fda.gov/media/152166/download  Fact Sheet for Healthcare Providers:  SeriousBroker.ithttps://www.fda.gov/media/152162/download  This test is no t yet approved or cleared by the [place] FDA and  has been authorized for detection and/or diagnosis of SARS-CoV-2 by FDA under an Emergency Use Authorization (EUA). This EUA will remain  in effect (meaning this test can be used) for the duration of the COVID-19 declaration under Section 564(b)(1) of the Act, 21 U.S.C.section 360bbb-3(b)(1), unless the authorization is terminated  or revoked sooner.       Influenza A by PCR NEGATIVE NEGATIVE Final   Influenza B by PCR NEGATIVE NEGATIVE Final    Comment: (NOTE) The Xpert Xpress SARS-CoV-2/FLU/RSV plus assay is intended as an aid in the diagnosis of influenza from Nasopharyngeal swab specimens and should not be used as a sole basis for treatment. Nasal washings and aspirates are unacceptable for Xpert Xpress SARS-CoV-2/FLU/RSV testing.  Fact Sheet for Patients: BloggerCourse.comhttps://www.fda.gov/media/152166/download  Fact Sheet for Healthcare Providers: SeriousBroker.ithttps://www.fda.gov/media/152162/download  This test is not yet approved or cleared by the [place] FDA and has been authorized for detection and/or diagnosis of SARS-CoV-2 by FDA under an Emergency Use Authorization (EUA). This EUA will remain in effect (meaning this test can be used) for the duration of the COVID-19 declaration under Section 564(b)(1) of the Act, 21 U.S.C. section 360bbb-3(b)(1), unless the authorization is terminated or revoked.  Performed at [facility], [address] [place], [place] [id number]      Labs: BNP (last 3 results) Recent Labs    09/22/20 2322  BNP 179.2*   Basic Metabolic Panel: Recent Labs  Lab 09/22/20 2204 09/24/20 0212  NA 135 139  K 3.9 3.8  CL 104 107  CO2 25 22  GLUCOSE 376* 94  BUN 16 11  CREATININE 0.80 0.62  CALCIUM 9.6 9.0   Liver Function Tests: No results for input(s): AST, ALT, ALKPHOS, BILITOT, PROT, ALBUMIN in the last 168 hours. No results for input(s): LIPASE, AMYLASE in the last 168 hours. No results for input(s): AMMONIA in the last 168 hours. CBC: Recent Labs  Lab 09/22/20 2204 09/24/20 0212 09/25/20 0200  WBC 4.8 4.3 5.6  HGB 13.0 10.7*  10.9*  HCT 38.6 32.3* 32.7*  MCV 87.3 90.0 87.9  PLT 116* 96* 100*   Cardiac Enzymes: No results for input(s): CKTOTAL, CKMB, CKMBINDEX, TROPONINI in the last 168 hours. BNP: Invalid input(s): POCBNP CBG: Recent Labs  Lab 09/24/20 2057 09/24/20 2356 09/25/20 0440 09/25/20 0900 09/25/20 1055  GLUCAP 166* 111* 116* 112* 161*   D-Dimer Recent Labs    09/22/20 2322  DDIMER 0.40   Hgb A1c Recent Labs    09/23/20 0210  HGBA1C 5.8*   Lipid Profile Recent Labs    09/23/20 0210  CHOL 116  HDL 48  LDLCALC 63  TRIG 24  CHOLHDL 2.4   Thyroid function studies No results for input(s): TSH, T4TOTAL, T3FREE, THYROIDAB in the last 72 hours.  Invalid input(s): FREET3 Anemia work up No results for input(s): VITAMINB12, FOLATE, FERRITIN, TIBC, IRON, RETICCTPCT in the last 72 hours. Urinalysis    Component Value Date/Time   COLORURINE YELLOW 06/07/2015 0831   APPEARANCEUR CLEAR 06/07/2015 0831   LABSPEC 1.010 06/07/2015 0831   PHURINE 5.5 06/07/2015 0831   GLUCOSEU NEGATIVE 06/07/2015 0831   HGBUR NEGATIVE 06/07/2015 0831   BILIRUBINUR NEGATIVE 06/07/2015 0831   KETONESUR NEGATIVE 06/07/2015 0831   PROTEINUR NEGATIVE 06/07/2015 0831   UROBILINOGEN 0.2 06/22/2012 1703   NITRITE NEGATIVE 06/07/2015 0831   LEUKOCYTESUR NEGATIVE 06/07/2015  0831   Sepsis Labs Invalid input(s): PROCALCITONIN,  WBC,  LACTICIDVEN Microbiology Recent Results (from the past 240 hour(s))  Resp Panel by RT-PCR (Flu A&B, Covid) Nasopharyngeal Swab     Status: None   Collection Time: 09/23/20 12:45 AM   Specimen: Nasopharyngeal Swab; Nasopharyngeal(NP) swabs in vial transport medium  Result Value Ref Range Status   SARS Coronavirus 2 by RT PCR NEGATIVE NEGATIVE Final    Comment: (NOTE) SARS-CoV-2 target nucleic acids are NOT DETECTED.  The SARS-CoV-2 RNA is generally detectable in upper respiratory specimens during the acute phase of infection. The lowest concentration of SARS-CoV-2 viral copies this assay can detect is 138 copies/mL. A negative result does not preclude SARS-Cov-2 infection and should not be used as the sole basis for treatment or other patient management decisions. A negative result may occur with  improper specimen collection/handling, submission of specimen other than nasopharyngeal swab, presence of viral mutation(s) within the areas targeted by this assay, and inadequate number of viral copies(<138 copies/mL). A negative result must be combined with clinical observations, patient history, and epidemiological information. The expected result is Negative.  Fact Sheet for Patients:  BloggerCourse.com  Fact Sheet for Healthcare Providers:  SeriousBroker.it  This test is no t yet approved or cleared by the [place] FDA and  has been authorized for detection and/or diagnosis of SARS-CoV-2 by FDA under an Emergency Use Authorization (EUA). This EUA will remain  in effect (meaning this test can be used) for the duration of the COVID-19 declaration under Section 564(b)(1) of the Act, 21 U.S.C.section 360bbb-3(b)(1), unless the authorization is terminated  or revoked sooner.       Influenza A by PCR NEGATIVE NEGATIVE Final   Influenza B by PCR NEGATIVE NEGATIVE Final     Comment: (NOTE) The Xpert Xpress SARS-CoV-2/FLU/RSV plus assay is intended as an aid in the diagnosis of influenza from Nasopharyngeal swab specimens and should not be used as a sole basis for treatment. Nasal washings and aspirates are unacceptable for Xpert Xpress SARS-CoV-2/FLU/RSV testing.  Fact Sheet for Patients: BloggerCourse.com  Fact Sheet for Healthcare Providers: SeriousBroker.it  This test is not yet approved  or cleared by the [place] and has been authorized for detection and/or diagnosis of SARS-CoV-2 by FDA under an Emergency Use Authorization (EUA). This EUA will remain in effect (meaning this test can be used) for the duration of the COVID-19 declaration under Section 564(b)(1) of the Act, 21 U.S.C. section 360bbb-3(b)(1), unless the authorization is terminated or revoked.  Performed at [facility], [address], [place], [place] [address]        SIGNED:   Marinda Elk, MD  [facility] 09/25/2020, 1:28 PM Pager   If 7PM-7AM, please contact night-coverage [email or web address] Password [email or web address]

## 2020-09-26 ENCOUNTER — Telehealth: Payer: Self-pay | Admitting: Family

## 2020-09-26 NOTE — Telephone Encounter (Signed)
Patient reports her BP is too low - makes her weak. She gets short of breath when walking, even to the car.   Current BP: 109/48 Has been 90s/40s HR in 70s  She was discharged home yesterday   Irbesartan and carvedilol are new for patient  Before hospitalization, she was on:  Olmesartan-amLODIPine-HCTZ 40-10-25 MG Tabs Hydralazine once daily  She would like to know if she can decrease any of her medications b/c her BP is low

## 2020-09-26 NOTE — Telephone Encounter (Signed)
Spoke with patient and relayed Luther Parody NP's recommendations. She voiced understanding. Also sent MyChart message

## 2020-09-26 NOTE — Telephone Encounter (Signed)
  Pt c/o medication issue:  1. Name of Medication:  amLODipine (NORVASC) 10 MG tablet carvedilol (COREG) 12.5 MG tablet hydrALAZINE (APRESOLINE) 25 MG tablet irbesartan (AVAPRO) 75 MG tablet  2. How are you currently taking this medication (dosage and times per day)?   3. Are you having a reaction (difficulty breathing--STAT)?   4. What is your medication issue? Pt said she was given these 4 meds to take after coming home from hospital, she said she became very SOB and would like to ask Caitlyn if she needs to lessen the dosages of her meds

## 2020-09-26 NOTE — Telephone Encounter (Signed)
Her systolic blood pressure in the hospital was routinely 140s-160s. Recommend she bring her blood pressure cuff to her next office visit to ensure accuracy. Blood medications were adjusted in the hospital for better blood pressure control and treatment of diastolic dysfunction.  Please ensure not taking Olmesartan-Amlodipine-HCTZ combo tablet anymore.   Medications at discharge: Irbesartan one 75 tablet daily Amlodipine one 10mg  tablet daily Hydralazine one 25mg  tablet TWICE daily Carvedilol one 12.5mg  tablet twice daily Furosemide 20mg  as needed for swelling   Tonight: If systolic BP <110 she may hold her Hydralazine. If systolic BP <100 she may hold Hydralazine and Carvedilol. Tomorrow morning and over the weekend if her systolic BP is <110 she should not take Amlodipine.   Keep record of BP over the weekend and send a log on Monday.  , NP

## 2020-10-01 ENCOUNTER — Telehealth: Payer: Self-pay | Admitting: Family

## 2020-10-01 NOTE — Telephone Encounter (Signed)
Patient's husband want give you a call about an appt. Patient just came out of hospital from having heart attack

## 2020-10-01 NOTE — Telephone Encounter (Signed)
Spoke with the pt and she says she needs to se her dentist 10/07/20 for an abscessed tooth and she would like to rescheduled her post cath appt.. I changed her appt to 10/09/20... pt says she is feeling well and will call if she has any problems prior to her appt.

## 2020-10-07 ENCOUNTER — Ambulatory Visit (HOSPITAL_BASED_OUTPATIENT_CLINIC_OR_DEPARTMENT_OTHER): Payer: Medicare Other | Admitting: Family

## 2020-10-09 ENCOUNTER — Ambulatory Visit (HOSPITAL_BASED_OUTPATIENT_CLINIC_OR_DEPARTMENT_OTHER): Payer: Medicare Other | Admitting: Family

## 2020-10-09 ENCOUNTER — Other Ambulatory Visit: Payer: Self-pay

## 2020-10-09 ENCOUNTER — Encounter (HOSPITAL_BASED_OUTPATIENT_CLINIC_OR_DEPARTMENT_OTHER): Payer: Self-pay | Admitting: Family

## 2020-10-09 ENCOUNTER — Ambulatory Visit (INDEPENDENT_AMBULATORY_CARE_PROVIDER_SITE_OTHER): Payer: Medicare Other

## 2020-10-09 VITALS — BP 130/68 | HR 75 | Ht 62.0 in | Wt 185.0 lb

## 2020-10-09 DIAGNOSIS — M79604 Pain in right leg: Secondary | ICD-10-CM

## 2020-10-09 DIAGNOSIS — R600 Localized edema: Secondary | ICD-10-CM

## 2020-10-09 DIAGNOSIS — I10 Essential (primary) hypertension: Secondary | ICD-10-CM

## 2020-10-09 DIAGNOSIS — I25118 Atherosclerotic heart disease of native coronary artery with other forms of angina pectoris: Secondary | ICD-10-CM

## 2020-10-09 DIAGNOSIS — I340 Nonrheumatic mitral (valve) insufficiency: Secondary | ICD-10-CM

## 2020-10-09 DIAGNOSIS — I5032 Chronic diastolic (congestive) heart failure: Secondary | ICD-10-CM | POA: Diagnosis not present

## 2020-10-09 DIAGNOSIS — I517 Cardiomegaly: Secondary | ICD-10-CM

## 2020-10-09 NOTE — Patient Instructions (Addendum)
Medication Instructions:  Your physician has recommended you make the following change in your medication:   CHANGE Furosemide (Lasix) to 2 tablets (40mg ) daily *Take 2 tablets when you get home today for a total dose of 60mg *  STOP Amlodipine as this may be contributory to swelling.    *If you need a refill on your cardiac medications before your next appointment, please call your pharmacy*   Lab Work: Your physician recommends that you return for lab work today: BMP, BNP   If you have labs (blood work) drawn today and your tests are completely normal, you will receive your results only by: MyChart Message (if you have MyChart) OR A paper copy in the mail If you have any lab test that is abnormal or we need to change your treatment, we will call you to review the results.   Testing/Procedures: Your physician has requested that you have a right lower extremity venous duplex to check for DVT. This test is an ultrasound of the veins in the legs or arms. It looks at venous blood flow that carries blood from the heart to the legs or arms. Allow one hour for a Lower Venous exam. Allow thirty minutes for an Upper Venous exam. There are no restrictions or special instructions.     Follow-Up: At [facility], you and your health needs are our priority.  As part of our continuing mission to provide you with exceptional heart care, we have created designated Provider Care Teams.  These Care Teams include your primary Cardiologist (physician) and Advanced Practice Providers (APPs -  Physician Assistants and Nurse Practitioners) who all work together to provide you with the care you need, when you need it.  We recommend signing up for the patient portal called "MyChart".  Sign up information is provided on this After Visit Summary.  MyChart is used to connect with patients for Virtual Visits (Telemedicine).  Patients are able to view lab/test results, encounter notes, upcoming appointments, etc.   Non-urgent messages can be sent to your provider as well.   To learn more about what you can do with MyChart, go to .    Your next appointment:   2 week(s)  The format for your next appointment:   In Person  Provider:   [facility], MD or [email or web address], NP    Other Instructions  Recommend weighing daily and keeping a log. Please call our office if you have weight gain of 2 pounds overnight or 5 pounds in 1 week.   Date  Time Weight

## 2020-10-09 NOTE — Progress Notes (Signed)
Office Visit    Patient Name: [name] Date of Encounter: 10/09/2020  PCP:  Ralene Ok, MD   [facility]  Cardiologist:  Jodelle Red, MD  Advanced Practice Provider:  No care team member to display Electrophysiologist:  None      Chief Complaint    EMIYA [name] is a 70 y.o. 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 fatty liver   Sarcoidosis    Sarcoidosis of lung (HCC)    Sarcoidosis of lymph nodes    Past Surgical History:  Procedure Laterality Date   ABDOMINAL HYSTERECTOMY     ABDOMINAL SURGERY     CHOLECYSTECTOMY     HERNIA REPAIR     LEFT HEART CATH AND CORONARY ANGIOGRAPHY N/A 09/23/2020   Procedure: LEFT HEART CATH AND CORONARY ANGIOGRAPHY;  Surgeon: Lennette Bihari, MD;  Location: [facility] INVASIVE CV LAB;  Service: Cardiovascular;  Laterality: N/A;   nissenfundiplication      Allergies  Allergies  Allergen Reactions   Sulfa Antibiotics Other (See Comments)    High fever Other reaction(s): Fever   Atorvastatin Other (See Comments)    Extreme muscle cramps and muscle aches   Amoxicillin Rash    History of Present Illness    [name] is a 70 y.o. female with a hx of hypertension, hyperlipidemia, DM2, nonobstructive CAD, mitral regurgitation, sarcoidosis, LVOT obstruction, LVH, SAM last seen while hospitalized.  Sarcoid initially diagnosed in 2010.  She was evaluated in the ED 09/22/2020 due to new bilateral foot swelling with systolic blood pressure of 210.  She had atypical chest pain and underwent cardiac  catheterization showing mild coronary artery disease recommended for conservative management.  Her hydrochlorothiazide was discontinued and beta-blocker increased.  Cardiac MRI performed showed no scarring, moderate MR, mitral valve notable for S.A.M. with late peaking regurgitation.  Her antihypertensive regimen was adjusted.  Presents today for follow-up.  Has been more short of breath since hospital discharge as well as weight gain. She is having swelling in her bilateral lower extremities with right greater than left. She has been taking her Lasix as prescribed. Tells me "there is something wrong with my right leg". It hurts from her knee down.  No noted injury.  She did have some hypotension initially in hospital discharge and was instructed to hold some of her antihypertensive if systolic blood pressure less than 110 but has not had to do often.  Does endorse feeling occasionally off balance.  She drinks 2 to 416 ounce bottles of water throughout the day.  She drinks a protein drink for breakfast, salad for lunch, small dinner.  Reports no chest pain, pressure, tightness. EKGs/Labs/Other Studies Reviewed:   The following studies were reviewed today: Echocardiogram 09/23/20: 1. Left ventricular ejection fraction, by estimation, is 65 to 70%. The  left ventricle has normal function. The left ventricle has no regional  wall motion abnormalities. Left ventricular diastolic parameters are  consistent with Grade II diastolic  dysfunction (pseudonormalization).   2. Right ventricular systolic function is normal.  The right ventricular  size is normal.   3. Left atrial size was mildly dilated.   4. There is systolic anterior motion of the MV leaflet. . The mitral  valve is normal in structure. Moderate mitral valve regurgitation.   5. The aortic valve is normal in structure. Aortic valve regurgitation is  not visualized.    LHC 09/23/20: Prox Cx lesion is 20% stenosed.   Mild nonobstructive CAD with  tortuous vessels and smooth 20% narrowing in the proximal circumflex far artery.  The LAD appears angiographically normal.  The right coronary artery is a dominant vessel with superior takeoff.   Mild LV to aorta gradient on pullback with a peak to peak gradient at approximately 16 mmHg.   RECOMMENDATION: Medical therapy.  Cardiac MRI 09/25/20 CONTRAST:  8 cc  of Gadavist   FINDINGS: 1. Mild left ventricular enlargement, with LVEDD 49 mm, but LVEDVi 83 mL/m2.   Abnormal left ventricular thickness, with intraventricular septal thickness of 17 mm, posterior wall thickness of 9 mm, and septal to posterior ratio > 1.5. Myocardial mass index 72 g/m2, mildly increased for gender.   Hyperdynamic left ventricular systolic function (LVEF =70%). There are no regional wall motion abnormalities. There is systolic anterior motion of the mitral valve.   Left ventricular parametric mapping notable for mild elevation in ECV signal in inferior and inferolateral apex and mid (35 ms at greatest) and normal T2 signal.   There is no late gadolinium enhancement in the left ventricular myocardium (5SD standard used).   2. Normal right ventricular size with RVEDVI 61 mL/m2.   Normal right ventricular thickness.   Normal right ventricular systolic function (RVEF =64 %). There are no regional wall motion abnormalities or aneurysms.   3.  Normal left and right atrial size.   4. Normal size of the aortic root, ascending aorta. Mild dilation of the pulmonary artery, 29 mm, with PA ratio < 0.9.   5.  Valve evaluation notable for:   Tri-leaflet Aortic valve with mean gradient 2.7 mm Hg and regurgitant volume of -2.4 mL. No significant valve disease   Pulmonic valve mean gradient of 1 mm Hg and regurgitant volume of 0.1 mL No significant valve disease.   Qualitatively no significant tricuspid valve disease.   Mitral valve notable for SAM as above with late peaking regurgitation   Valve  regurgitation by LV-RV volumes: 37 mL; regurgitant fraction 34%   Valve regurgitation by LV-AO volumes: 43 mL; regurgitation fraction 39%   Valve regurgitation by LV-PApC volumes: 39 mL; regurgitation fraction 35%   Suggestive of moderate mitral regurgitation.   6.  Normal pericardium.  No pericardial effusion.   7. Grossly, no extracardiac findings. Recommended dedicated study if concerned for non-cardiac pathology.   IMPRESSION: Asymmetric septal hypertrophy without other evidence of infiltrative disease and systolic anterior motion of the mitral valve: This can be seen in hypertropic obstructive cardiomyopathy.   Moderate mitral regurgitation.    EKG:  No EKG today   Recent Labs: 09/22/2020: B Natriuretic Peptide 179.2 09/24/2020: BUN 11; Creatinine, Ser 0.62; Potassium 3.8; Sodium 139 09/25/2020: Hemoglobin 10.9; Platelets 100  Recent Lipid Panel    Component Value Date/Time   CHOL 116 09/23/2020 0210   TRIG 24 09/23/2020 0210   HDL 48 09/23/2020 0210   CHOLHDL 2.4 09/23/2020 0210   VLDL 5 09/23/2020 0210   LDLCALC 63 09/23/2020 0210    Home Medications   Current Meds  Medication Sig   acetaminophen (TYLENOL) 500 MG tablet  Take 500 mg by mouth every 6 (six) hours as needed for mild pain.   acyclovir (ZOVIRAX) 400 MG tablet Take 400 mg by mouth every 8 (eight) hours as needed. Fever blisters   aspirin 81 MG chewable tablet Chew and swallow 1 tablet (81 mg total) by mouth daily.   calcium-vitamin D (OSCAL WITH D) 500-200 MG-UNIT tablet Take 1 tablet by mouth daily with breakfast.   carvedilol (COREG) 12.5 MG tablet Take 1 tablet (12.5 mg total) by mouth 2 (two) times daily with a meal.   cetirizine (ZYRTEC) 10 MG tablet Take 10 mg by mouth daily as needed for allergies.   clindamycin (CLEOCIN) 300 MG capsule Take 300 mg by mouth every 8 (eight) hours.   furosemide (LASIX) 20 MG tablet Take 20 mg by mouth daily as needed for fluid or edema.    gabapentin (NEURONTIN)  300 MG capsule Take 300 mg by mouth 3 (three) times daily.   hydrALAZINE (APRESOLINE) 25 MG tablet Take 25 mg by mouth 2 (two) times daily.   irbesartan (AVAPRO) 75 MG tablet Take 1 tablet (75 mg total) by mouth daily.   levothyroxine (SYNTHROID, LEVOTHROID) 150 MCG tablet Take 75-150 mcg by mouth daily before breakfast. Pt alternates between 150 mcg and 75 mcg each day   LORazepam (ATIVAN) 1 MG tablet Take 1 mg by mouth 3 (three) times daily as needed for anxiety.   metFORMIN (GLUCOPHAGE-XR) 500 MG 24 hr tablet Take 2 tablets (1,000 mg total) by mouth 2 (two) times daily.   oxyCODONE-acetaminophen (PERCOCET) 5-325 MG tablet Take 1-2 tablets by mouth every 6 (six) hours as needed.   OZEMPIC, 1 MG/DOSE, 4 MG/3ML SOPN Inject 1 mg into the skin once a week. Monday   rosuvastatin (CRESTOR) 10 MG tablet Take 10 mg by mouth daily.   zolpidem (AMBIEN) 10 MG tablet Take 10 mg by mouth at bedtime as needed for sleep.   [DISCONTINUED] amLODipine (NORVASC) 10 MG tablet Take 1 tablet (10 mg total) by mouth daily.     Review of Systems      All other systems reviewed and are otherwise negative except as noted above.  Physical Exam    VS:  BP 130/68   Pulse 75   Ht 5\' 2"  (1.575 m)   Wt 185 lb (83.9 kg)   SpO2 97%   BMI 33.84 kg/m  , BMI Body mass index is 33.84 kg/m.  Wt Readings from Last 3 Encounters:  10/09/20 185 lb (83.9 kg)  09/22/20 180 lb (81.6 kg)  06/10/20 175 lb (79.4 kg)     GEN: Well nourished, well developed, in no acute distress. HEENT: normal. Neck: Supple, no JVD, carotid bruits, or masses. Cardiac: RRR, no murmurs, rubs, or gallops. No clubbing, cyanosis. RLE tender to palpation with 2+ pitting edema and LLE with 1+ pitting edema. No erythema bilaterally. .  Radials/PT 2+ and equal bilaterally.  Respiratory:  Respirations regular and unlabored, clear to auscultation bilaterally. GI: Soft, nontender, nondistended. MS: No deformity or atrophy. Skin: Warm and dry, no  rash. Neuro:  Strength and sensation are intact. Psych: Normal affect.  Assessment & Plan    Nonobstructive CAD- Stable with no anginal symptoms. No indication for ischemic evaluation. Right radial cath site healing appropriately with no hematoma nor ecchymosis. GDMT includes aspirin, coreg, rosuvastatin. Heart healthy diet and regular cardiovascular exercise encouraged.    HFpEf - Volume overloaded approx 15 pounds with goal weight 170 lbs. Daily weight encouraged. Lab work today including BMP,  BNP. Increased Lasix to 40mg  QD. Return to 20mg  QD when down to goal weight of 170 lbs. Low sodium diet and fluid restriction <2L encouraged.   LE edema R>L - Concern for baker's cyst vs DVT given recent hospital stay and worsening edema. Subsequent LE duplex today with no evidence of DVT. Increased diuresis, as above. Discontinue Amlodipine  as may be contributing to edema.   Hypertension - BP well controlled. Discontinue Amlodipine due to concern contributing to LE edema. Careful home monitoring encouraged.   LVOT/LVH/S.A.M./ MR -Echo 09/2020 with S.A.M. with moderate MR and c/f LVOT.  Cardiac MRI during recent admission detailed above. Careful titration of diuretics and antihypertensives. Future considerations include addition of Verapamil for LVOT benefit.   HLD, LDL goal less than 70-LDL 63 most recently.  Continue present dose Crestor.  DM2 - Most recent A1c 5.8. Follows with primary care.   Disposition: Follow up in 2 week(s) with Dr. or APP.  Signed, 10/2020, NP 10/09/2020, 7:10 PM [facility]

## 2020-10-10 LAB — BASIC METABOLIC PANEL
BUN/Creatinine Ratio: 27 (ref 12–28)
BUN: 17 mg/dL (ref 8–27)
CO2: 21 mmol/L (ref 20–29)
Calcium: 9.4 mg/dL (ref 8.7–10.3)
Chloride: 108 mmol/L — ABNORMAL HIGH (ref 96–106)
Creatinine, Ser: 0.63 mg/dL (ref 0.57–1.00)
Glucose: 97 mg/dL (ref 65–99)
Potassium: 4.9 mmol/L (ref 3.5–5.2)
Sodium: 142 mmol/L (ref 134–144)
eGFR: 94 mL/min/{1.73_m2} (ref >59)

## 2020-10-10 LAB — BRAIN NATRIURETIC PEPTIDE: BNP: 177.4 pg/mL — ABNORMAL HIGH (ref 0.0–100.0)

## 2020-10-13 ENCOUNTER — Telehealth (HOSPITAL_BASED_OUTPATIENT_CLINIC_OR_DEPARTMENT_OTHER): Payer: Self-pay | Admitting: Family

## 2020-10-13 NOTE — Telephone Encounter (Signed)
Reviewed lab results with patient. Tells me her edema is improving. She is not weighing at home but agreeable to do so. Will plan to get labs at her appointment next week.   Alver Sorrow, NP

## 2020-10-14 NOTE — Telephone Encounter (Signed)
Patient MyChart message routed to [facility] NP concerning BP management

## 2020-10-14 NOTE — Telephone Encounter (Signed)
Amlodipine was discontinued as it may be contributory to lower extremity edema. Do not recommend resuming. If BP elevated, can increase Hydralazine from 25mg  BID to 50mg  BID. Please ask her to send report of BP readings in about 1 week.  Thanks! , NP

## 2020-10-14 NOTE — Telephone Encounter (Signed)
Send message to patient via My Chart

## 2020-10-22 NOTE — Progress Notes (Signed)
Office Visit    Patient Name: [name] Date of Encounter: 10/23/2020  PCP:  Ralene Ok, MD   [facility]  Cardiologist:  Jodelle Red, MD  Advanced Practice Provider:  No care team member to display Electrophysiologist:  None      Chief Complaint    [name] is a 70 y.o. 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 fatty liver   Sarcoidosis    Sarcoidosis of lung (HCC)    Sarcoidosis of lymph nodes    Past Surgical History:  Procedure Laterality Date   ABDOMINAL HYSTERECTOMY     ABDOMINAL SURGERY     CHOLECYSTECTOMY     HERNIA REPAIR     LEFT HEART CATH AND CORONARY ANGIOGRAPHY N/A 09/23/2020   Procedure: LEFT HEART CATH AND CORONARY ANGIOGRAPHY;  Surgeon: Lennette Bihari, MD;  Location: [facility];  Service: Cardiovascular;  Laterality: N/A;   nissenfundiplication      Allergies  Allergies  Allergen Reactions   Sulfa Antibiotics Other (See Comments)    High fever Other reaction(s): Fever   Atorvastatin Other (See Comments)    Extreme muscle cramps and muscle aches   Amoxicillin Rash    History of Present Illness    [name] is a 70 y.o. female with a hx of hypertension, hyperlipidemia, DM2, nonobstructive CAD, mitral regurgitation, sarcoidosis, LVOT obstruction, LVH, SAM last seen 10/09/20  Sarcoid initially diagnosed in 2010.  She was evaluated in the ED 09/22/2020 due to new bilateral foot swelling with systolic blood pressure of 210.  She had atypical chest pain and underwent cardiac catheterization  showing mild coronary artery disease recommended for conservative management.  Her hydrochlorothiazide was discontinued and beta-blocker increased.  Cardiac MRI performed showed no scarring, moderate MR, mitral valve notable for S.A.M. with late peaking regurgitation.  Her antihypertensive regimen was adjusted.  She was seen 10/09/20 with increased shortness of breath noted to be up 15 pounds above dry weight. LE duplex performed due to leg swelling right greater than left which was negative for DVT bilaterally. Her Lasix was increased from 20mg  to 40mg  QD.   She presents today for follow up. Her weight is down 6 pounds from last clinic visit. Her lower extremity edema is improved. She still feels short of breath with exercise. She is trying to walk but noting dyspnea. This is slowly improving. She is walking 5-10 minutes daily with plans to gradually increased. Her primary care provider has filled out a handicap placard for her already. Feels her dyspnea is not back to her baseline pre September hospitalization. She has recently started reducing her sodium intake and buying salt-free cans. Reports no orthopnea, edema, PND.    EKGs/Labs/Other Studies Reviewed:   The following studies were reviewed today:  Echocardiogram 09/23/20: 1. Left ventricular ejection fraction, by estimation, is 65 to 70%. The  left ventricle has normal function. The left ventricle has no regional  wall motion abnormalities. Left ventricular diastolic parameters are  consistent with Grade II diastolic  dysfunction (pseudonormalization).   2. Right ventricular systolic function is normal. The right ventricular  size is normal.   3. Left atrial size was mildly dilated.   4. There is systolic anterior motion of the MV leaflet. . The mitral  valve is normal in structure. Moderate mitral valve regurgitation.   5. The aortic valve is normal in structure. Aortic valve regurgitation is  not visualized.    LHC 09/23/20: Prox Cx  lesion is 20% stenosed.   Mild nonobstructive CAD with tortuous vessels and smooth 20% narrowing in the proximal circumflex far artery.  The LAD appears angiographically normal.  The right coronary artery is a dominant vessel with superior takeoff.   Mild LV to aorta gradient on pullback with a peak to peak gradient at approximately 16 mmHg.   RECOMMENDATION: Medical therapy.  Cardiac MRI 09/25/20 CONTRAST:  8 cc  of Gadavist   FINDINGS: 1. Mild left ventricular enlargement, with LVEDD 49 mm, but LVEDVi 83 mL/m2.   Abnormal left ventricular thickness, with intraventricular septal thickness of 17 mm, posterior wall thickness of 9 mm, and septal to posterior ratio > 1.5. Myocardial mass index 72 g/m2, mildly increased for gender.   Hyperdynamic left ventricular systolic function (LVEF =70%). There are no regional wall motion abnormalities. There is systolic anterior motion of the mitral valve.   Left ventricular parametric mapping notable for mild elevation in ECV signal in inferior and inferolateral apex and mid (35 ms at greatest) and normal T2 signal.   There is no late gadolinium enhancement in the left ventricular myocardium (5SD standard used).   2. Normal right ventricular size with RVEDVI 61 mL/m2.   Normal right ventricular thickness.   Normal right ventricular systolic function (RVEF =64 %). There are no regional wall motion abnormalities or aneurysms.   3.  Normal left and right atrial size.   4. Normal size of the aortic root, ascending aorta. Mild dilation of the pulmonary artery, 29 mm, with PA ratio < 0.9.   5.  Valve evaluation notable for:   Tri-leaflet Aortic valve with mean gradient 2.7 mm Hg and regurgitant volume of -2.4 mL. No significant valve disease   Pulmonic valve mean gradient of 1 mm Hg and regurgitant volume of 0.1 mL No significant valve disease.   Qualitatively no significant tricuspid valve disease.   Mitral valve notable for SAM as  above with late peaking regurgitation   Valve regurgitation by LV-RV volumes: 37 mL; regurgitant fraction 34%   Valve regurgitation by LV-AO volumes: 43 mL; regurgitation fraction 39%   Valve regurgitation by LV-PApC volumes: 39 mL; regurgitation fraction 35%   Suggestive of moderate mitral regurgitation.   6.  Normal pericardium.  No pericardial effusion.   7. Grossly, no extracardiac findings. Recommended dedicated study if concerned for non-cardiac pathology.   IMPRESSION: Asymmetric septal hypertrophy without other evidence of infiltrative disease and systolic anterior motion of the mitral valve: This can be seen in hypertropic obstructive cardiomyopathy.   Moderate mitral regurgitation.   EKG:  No EKG today   Recent Labs: 09/25/2020: Hemoglobin 10.9; Platelets 100 10/09/2020: BNP 177.4; BUN 17; Creatinine, Ser 0.63; Potassium 4.9; Sodium 142  Recent Lipid Panel    Component Value Date/Time   CHOL 116 09/23/2020 0210   TRIG 24 09/23/2020 0210   HDL 48 09/23/2020 0210   CHOLHDL 2.4 09/23/2020 0210   VLDL 5 09/23/2020 0210   LDLCALC 63 09/23/2020 0210    Home Medications   Current Meds  Medication Sig  acetaminophen (TYLENOL) 500 MG tablet Take 500 mg by mouth every 6 (six) hours as needed for mild pain.   acyclovir (ZOVIRAX) 400 MG tablet Take 400 mg by mouth every 8 (eight) hours as needed. Fever blisters   aspirin 81 MG chewable tablet Chew and swallow 1 tablet (81 mg total) by mouth daily.   calcium-vitamin D (OSCAL WITH D) 500-200 MG-UNIT tablet Take 1 tablet by mouth daily with breakfast.   cetirizine (ZYRTEC) 10 MG tablet Take 10 mg by mouth daily as needed for allergies.   clindamycin (CLEOCIN) 300 MG capsule Take 300 mg by mouth every 8 (eight) hours.   furosemide (LASIX) 20 MG tablet Take 20 mg by mouth daily as needed for fluid or edema.    gabapentin (NEURONTIN) 300 MG capsule Take 300 mg by mouth 3 (three) times daily.   hydrALAZINE (APRESOLINE) 25  MG tablet Take 25 mg by mouth 2 (two) times daily.   levothyroxine (SYNTHROID, LEVOTHROID) 150 MCG tablet Take 75-150 mcg by mouth daily before breakfast. Pt alternates between 150 mcg and 75 mcg each day   LORazepam (ATIVAN) 1 MG tablet Take 1 mg by mouth 3 (three) times daily as needed for anxiety.   metFORMIN (GLUCOPHAGE-XR) 500 MG 24 hr tablet Take 2 tablets (1,000 mg total) by mouth 2 (two) times daily.   oxyCODONE-acetaminophen (PERCOCET) 5-325 MG tablet Take 1-2 tablets by mouth every 6 (six) hours as needed.   OZEMPIC, 1 MG/DOSE, 4 MG/3ML SOPN Inject 1 mg into the skin once a week. Monday   rosuvastatin (CRESTOR) 10 MG tablet Take 10 mg by mouth daily.   zolpidem (AMBIEN) 10 MG tablet Take 10 mg by mouth at bedtime as needed for sleep.   [DISCONTINUED] carvedilol (COREG) 12.5 MG tablet Take 1 tablet (12.5 mg total) by mouth 2 (two) times daily with a meal.   [DISCONTINUED] irbesartan (AVAPRO) 75 MG tablet Take 1 tablet (75 mg total) by mouth daily.     Review of Systems      All other systems reviewed and are otherwise negative except as noted above.  Physical Exam    VS:  BP (!) 130/58   Pulse 70   Ht 5\' 2"  (1.575 m)   Wt 179 lb 9.6 oz (81.5 kg)   SpO2 97%   BMI 32.85 kg/m  , BMI Body mass index is 32.85 kg/m.  Wt Readings from Last 3 Encounters:  10/23/20 179 lb 9.6 oz (81.5 kg)  10/09/20 185 lb (83.9 kg)  09/22/20 180 lb (81.6 kg)     GEN: Well nourished, well developed, in no acute distress. HEENT: normal. Neck: Supple, no JVD, carotid bruits, or masses. Cardiac: RRR, no murmurs, rubs, or gallops. No clubbing, cyanosis. RLE tender to palpation with 2+ pitting edema and LLE with 1+ pitting edema. No erythema bilaterally. .  Radials/PT 2+ and equal bilaterally.  Respiratory:  Respirations regular and unlabored, clear to auscultation bilaterally. GI: Soft, nontender, nondistended. MS: No deformity or atrophy. Skin: Warm and dry, no rash. Neuro:  Strength and  sensation are intact. Psych: Normal affect.  Assessment & Plan    Nonobstructive CAD- Stable with no anginal symptoms. No indication for ischemic evaluation. GDMT includes aspirin, coreg, rosuvastatin. Heart healthy diet and regular cardiovascular exercise encouraged.    DOE - Likely multifactorial HFpEF, deconditioning, LVH. She is walking 5-10 minutes daily and plans to gradually increase. Offered referral to PREP program which she politely declines.   HFpEf - Goal weight 170 lbs. Down  6 lbs since last seen. LE edema much improved. Low sodium diet and fluid restriction <2L encouraged. She has been making dietary changes. Continue Furosemide 40mg  QD. BMP, BNP today for monitoring. GDMT includes Carvedilol, Irbesartan, Furosemide.   LE edema - LE duplex 10/09/20 with no DVT bilaterally. Amlodipine previously discontinued as concern it was contributory. LE much improved after additional diuresis. Continued elevation of lower extremities and low salt diet encouraged.    Hypertension - BP well controlled. Continue current antihypertensive regimen.  Refills provided.   LVOT/LVH/S.A.M./ MR -Echo 09/2020 with S.A.M. with moderate MR and c/f LVOT.  Cardiac MRI during recent admission detailed above. Careful titration of diuretics and antihypertensives. Future considerations include addition of Verapamil for LVOT benefit.   HLD, LDL goal less than 70-LDL 63 most recently.  Continue present dose Crestor.  DM2 - Most recent A1c 5.8. Follows with primary care.   Disposition: Follow up  in 2 month(s) with Dr. 10/2020 or APP.  Signed, Cristal Deer, NP 10/23/2020, 10:27 AM [facility]

## 2020-10-23 ENCOUNTER — Other Ambulatory Visit: Payer: Self-pay

## 2020-10-23 ENCOUNTER — Ambulatory Visit (HOSPITAL_BASED_OUTPATIENT_CLINIC_OR_DEPARTMENT_OTHER): Payer: Medicare Other | Admitting: Family

## 2020-10-23 ENCOUNTER — Encounter (HOSPITAL_BASED_OUTPATIENT_CLINIC_OR_DEPARTMENT_OTHER): Payer: Self-pay | Admitting: Family

## 2020-10-23 VITALS — BP 130/58 | HR 70 | Ht 62.0 in | Wt 179.6 lb

## 2020-10-23 DIAGNOSIS — I10 Essential (primary) hypertension: Secondary | ICD-10-CM | POA: Diagnosis not present

## 2020-10-23 DIAGNOSIS — R600 Localized edema: Secondary | ICD-10-CM | POA: Diagnosis not present

## 2020-10-23 DIAGNOSIS — I5032 Chronic diastolic (congestive) heart failure: Secondary | ICD-10-CM

## 2020-10-23 DIAGNOSIS — I25118 Atherosclerotic heart disease of native coronary artery with other forms of angina pectoris: Secondary | ICD-10-CM | POA: Diagnosis not present

## 2020-10-23 DIAGNOSIS — I517 Cardiomegaly: Secondary | ICD-10-CM

## 2020-10-23 MED ORDER — CARVEDILOL 12.5 MG PO TABS: 12.5000 mg | ORAL_TABLET | Freq: Two times a day (BID) | ORAL | 3 refills | Status: DC

## 2020-10-23 MED ORDER — IRBESARTAN 75 MG PO TABS: 75.0000 mg | ORAL_TABLET | Freq: Every day | ORAL | 3 refills | Status: DC

## 2020-10-23 NOTE — Patient Instructions (Addendum)
Medication Instructions:  Continue your current medications.   *If you need a refill on your cardiac medications before your next appointment, please call your pharmacy*   Lab Work: Your physician recommends that you return for lab work today for BMP, BNP  If you have labs (blood work) drawn today and your tests are completely normal, you will receive your results only by: MyChart Message (if you have MyChart) OR A paper copy in the mail If you have any lab test that is abnormal or we need to change your treatment, we will call you to review the results.   Testing/Procedures: None ordered today.  Follow-Up: At [facility], you and your health needs are our priority.  As part of our continuing mission to provide you with exceptional heart care, we have created designated Provider Care Teams.  These Care Teams include your primary Cardiologist (physician) and Advanced Practice Providers (APPs -  Physician Assistants and Nurse Practitioners) who all work together to provide you with the care you need, when you need it.  We recommend signing up for the patient portal called "MyChart".  Sign up information is provided on this After Visit Summary.  MyChart is used to connect with patients for Virtual Visits (Telemedicine).  Patients are able to view lab/test results, encounter notes, upcoming appointments, etc.  Non-urgent messages can be sent to your provider as well.   To learn more about what you can do with MyChart, go to [email or web address].    Your next appointment:   2 months  The format for your next appointment:   In Person  Provider:   Jodelle Red, MD or Alver Sorrow, NP    Other Instructions  Heart Healthy Diet Recommendations: A low-salt diet is recommended. Meats should be grilled, baked, or boiled. Avoid fried foods. Focus on lean protein sources like fish or chicken with vegetables and fruits. The American Heart Association is a Chief Technology Officer!    Exercise recommendations: The American Heart Association recommends 150 minutes of moderate intensity exercise weekly. Try 30 minutes of moderate intensity exercise 4-5 times per week. This could include walking, jogging, or swimming.   To prevent or reduce lower extremity swelling: Eat a low salt diet. Salt makes the body hold onto extra fluid which causes swelling. Sit with legs elevated. For example, in the recliner or on an ottoman.  Wear knee-high compression stockings during the daytime. Ones labeled 15-20 mmHg provide good compression.  Recommend weighing daily and keeping a log. Please call our office if you have weight gain of 2 pounds overnight or 5 pounds in 1 week.   Date  Time Weight

## 2020-10-24 LAB — BASIC METABOLIC PANEL
BUN/Creatinine Ratio: 13 (ref 12–28)
BUN: 10 mg/dL (ref 8–27)
CO2: 22 mmol/L (ref 20–29)
Calcium: 9.9 mg/dL (ref 8.7–10.3)
Chloride: 104 mmol/L (ref 96–106)
Creatinine, Ser: 0.77 mg/dL (ref 0.57–1.00)
Glucose: 103 mg/dL — ABNORMAL HIGH (ref 70–99)
Potassium: 4.6 mmol/L (ref 3.5–5.2)
Sodium: 140 mmol/L (ref 134–144)
eGFR: 82 mL/min/{1.73_m2} (ref >59)

## 2020-10-24 LAB — BRAIN NATRIURETIC PEPTIDE: BNP: 56.2 pg/mL (ref 0.0–100.0)

## 2020-10-27 ENCOUNTER — Telehealth: Payer: Self-pay | Admitting: *Deleted

## 2020-10-27 NOTE — Telephone Encounter (Signed)
   Patient Name: [name]  DOB: Nov 10, 1950 MRN: [id number]  Primary Cardiologist: Jodelle Red, MD  Chart reviewed as part of pre-operative protocol coverage.   Recent cardiac catheterization showed minimal CAD, she is a low risk candidate for any dental procedure. She may hold aspirin for 5 days prior to the procedure and restart it as soon as possible afterward at the surgeon's discretion  The patient was advised that if she develops new symptoms prior to surgery to contact our office to arrange for a follow-up visit, and she verbalized understanding.  SBE prophylaxis is not required for the patient from a cardiac standpoint.  I will route this recommendation to the requesting party via Epic fax function and remove from pre-op pool.  Please call with questions.  [place], [place] 10/27/2020, 6:09 PM

## 2020-10-27 NOTE — Telephone Encounter (Signed)
   [facility] Pre-operative Risk Assessment    Patient Name: [name]  DOB: 1950/03/19 MRN: [id number]  HEARTCARE STAFF:  - IMPORTANT!!!!!! Under Visit Info/Reason for Call, type in Other and utilize the format Clearance MM/DD/YY or Clearance TBD. Do not use dashes or single digits. - Please review there is not already an duplicate clearance open for this procedure. - If request is for dental extraction, please clarify the # of teeth to be extracted. - If the patient is currently at the dentist's office, call Pre-Op Callback Staff (MA/nurse) to input urgent request.  - If the patient is not currently in the dentist office, please route to the Pre-Op pool.  Request for surgical clearance:  What type of surgery is being performed? ROOT CANAL  When is this surgery scheduled? 11/06/20  What type of clearance is required (medical clearance vs. Pharmacy clearance to hold med vs. Both)? MEDICAL  Are there any medications that need to be held prior to surgery and how long? ASA  Practice name and name of physician performing surgery? [facility]; DR. Jannifer Rodney, DMD  What is the office phone number? [phone]   7.   What is the office fax number? [phone]  8.   Anesthesia type (None, local, MAC, general) ? LOCAL   Julaine Hua 10/27/2020, 2:18 PM  _________________________________________________________________   (provider comments below)

## 2020-11-06 NOTE — Telephone Encounter (Signed)
[name] with [facility] is following up. She states they never received the recommendation and she would like to know if it can be resubmitted to fax #, [phone].

## 2020-11-10 ENCOUNTER — Encounter (HOSPITAL_BASED_OUTPATIENT_CLINIC_OR_DEPARTMENT_OTHER): Payer: Self-pay

## 2020-11-20 ENCOUNTER — Other Ambulatory Visit: Payer: Self-pay | Admitting: Internal Medicine

## 2020-11-20 DIAGNOSIS — N6489 Other specified disorders of breast: Secondary | ICD-10-CM

## 2020-11-25 ENCOUNTER — Other Ambulatory Visit: Payer: Self-pay | Admitting: Internal Medicine

## 2020-11-25 DIAGNOSIS — R9389 Abnormal findings on diagnostic imaging of other specified body structures: Secondary | ICD-10-CM

## 2020-11-27 ENCOUNTER — Ambulatory Visit
Admission: RE | Admit: 2020-11-27 | Discharge: 2020-11-27 | Disposition: A | Discharge status: Home / Self Care | Payer: Medicare Other | Source: Ambulatory Visit | Attending: Internal Medicine | Admitting: Internal Medicine

## 2020-11-27 DIAGNOSIS — R9389 Abnormal findings on diagnostic imaging of other specified body structures: Secondary | ICD-10-CM

## 2020-11-30 ENCOUNTER — Encounter (HOSPITAL_BASED_OUTPATIENT_CLINIC_OR_DEPARTMENT_OTHER): Payer: Self-pay

## 2020-12-02 ENCOUNTER — Encounter (HOSPITAL_BASED_OUTPATIENT_CLINIC_OR_DEPARTMENT_OTHER): Payer: Self-pay

## 2020-12-09 ENCOUNTER — Ambulatory Visit: Payer: Medicare Other | Admitting: Internal Medicine

## 2020-12-09 ENCOUNTER — Encounter: Payer: Self-pay | Admitting: Internal Medicine

## 2020-12-09 ENCOUNTER — Other Ambulatory Visit: Payer: Self-pay

## 2020-12-09 VITALS — BP 144/74 | HR 88 | Temp 98.8°F | Ht 62.0 in | Wt 186.2 lb

## 2020-12-09 DIAGNOSIS — J4540 Moderate persistent asthma, uncomplicated: Secondary | ICD-10-CM | POA: Diagnosis not present

## 2020-12-09 MED ORDER — ALBUTEROL SULFATE HFA 108 (90 BASE) MCG/ACT IN AERS: 2.0000 | INHALATION_SPRAY | Freq: Four times a day (QID) | RESPIRATORY_TRACT | 5 refills | Status: DC | PRN

## 2020-12-09 MED ORDER — DULERA 100-5 MCG/ACT IN AERO: 2.0000 | INHALATION_SPRAY | Freq: Two times a day (BID) | RESPIRATORY_TRACT | 5 refills | Status: DC

## 2020-12-09 NOTE — Progress Notes (Signed)
The patient has been prescribed the inhaler dulera. Inhaler technique was demonstrated to patient. The patient subsequently demonstrated correct technique.  

## 2020-12-09 NOTE — Patient Instructions (Addendum)
Please schedule follow up scheduled with myself in 2 months.  Before your next visit I would like you to have:  Spirometry/Feno - will get this at the next visit.   Start dulera inhaler 2 puffs twice a day. Gargle after use.  Take the albuterol rescue inhaler every 4 to 6 hours as needed for wheezing or shortness of breath. You can also take it 15 minutes before exercise or exertional activity. Side effects include heart racing or pounding, jitters or anxiety. If you have a history of an irregular heart rhythm, it can make this worse. Can also give some patients a hard time sleeping.  Follow up with your primary doctor about liver.   By learning about asthma and how it can be controlled, you take an important step toward managing this disease. Work closely with your asthma care team to learn all you can about your asthma, how to avoid triggers, what your medications do, and how to take them correctly. With proper care, you can live free of asthma symptoms and maintain a normal, healthy lifestyle.   What is asthma? Asthma is a chronic disease that affects the airways of the lungs. During normal breathing, the bands of muscle that surround the airways are relaxed and air moves freely. During an asthma episode or "attack," there are three main changes that stop air from moving easily through the airways: The bands of muscle that surround the airways tighten and make the airways narrow. This tightening is called bronchospasm.  The lining of the airways becomes swollen or inflamed.  The cells that line the airways produce more mucus, which is thicker than normal and clogs the airways.  These three factors - bronchospasm, inflammation, and mucus production - cause symptoms such as difficulty breathing, wheezing, and coughing.  What are the most common symptoms of asthma? Asthma symptoms are not the same for everyone. They can even change from episode to episode in the same person. Also, you may  have only one symptom of asthma, such as cough, but another person may have all the symptoms of asthma. It is important to know all the symptoms of asthma and to be aware that your asthma can present in any of these ways at any time. The most common symptoms include: Coughing, especially at night  Shortness of breath  Wheezing  Chest tightness, pain, or pressure   Who is affected by asthma? Asthma affects 22 million Americans; about 6 million of these are children under age 73. People who have a family history of asthma have an increased risk of developing the disease. Asthma is also more common in people who have allergies or who are exposed to tobacco smoke. However, anyone can develop asthma at any time. Some people may have asthma all of their lives, while others may develop it as adults.  What causes asthma? The airways in a person with asthma are very sensitive and react to many things, or "triggers." Contact with these triggers causes asthma symptoms. One of the most important parts of asthma control is to identify your triggers and then avoid them when possible. The only trigger you do not want to avoid is exercise. Pre-treatment with medicines before exercise can allow you to stay active yet avoid asthma symptoms. Common asthma triggers include: Infections (colds, viruses, flu, sinus infections)  Exercise  Weather (changes in temperature and/or humidity, cold air)  Tobacco smoke  Allergens (dust mites, pollens, pets, mold spores, cockroaches, and sometimes foods)  Irritants (strong odors from  cleaning products, perfume, wood smoke, air pollution)  Strong emotions such as crying or laughing hard  Some medications   How is asthma diagnosed? To diagnose asthma, your doctor will first review your medical history, family history, and symptoms. Your doctor will want to know any past history of breathing problems you may have had, as well as a family history of asthma, allergies, eczema (a  bumpy, itchy skin rash caused by allergies), or other lung disease. It is important that you describe your symptoms in detail (cough, wheeze, shortness of breath, chest tightness), including when and how often they occur. The doctor will perform a physical examination and listen to your heart and lungs. He or she may also order breathing tests, allergy tests, blood tests, and chest and sinus X-rays. The tests will find out if you do have asthma and if there are any other conditions that are contributing factors.  How is asthma treated? Asthma can be controlled, but not cured. It is not normal to have frequent symptoms, trouble sleeping, or trouble completing tasks. Appropriate asthma care will prevent symptoms and visits to the emergency room and hospital. Asthma medicines are one of the mainstays of asthma treatment. The drugs used to treat asthma are explained below.  Anti-inflammatories: These are the most important drugs for most people with asthma. Anti-inflammatory drugs reduce swelling and mucus production in the airways. As a result, airways are less sensitive and less likely to react to triggers. These medications need to be taken daily and may need to be taken for several weeks before they begin to control asthma. Anti-inflammatory medicines lead to fewer symptoms, better airflow, less sensitive airways, less airway damage, and fewer asthma attacks. If taken every day, they CONTROL or prevent asthma symptoms.   Bronchodilators: These drugs relax the muscle bands that tighten around the airways. This action opens the airways, letting more air in and out of the lungs and improving breathing. Bronchodilators also help clear mucus from the lungs. As the airways open, the mucus moves more freely and can be coughed out more easily. In short-acting forms, bronchodilators RELIEVE or stop asthma symptoms by quickly opening the airways and are very helpful during an asthma episode. In long-acting forms,  bronchodilators provide CONTROL of asthma symptoms and prevent asthma episodes.  Asthma drugs can be taken in a variety of ways. Inhaling the medications by using a metered dose inhaler, dry powder inhaler, or nebulizer is one way of taking asthma medicines. Oral medicines (pills or liquids you swallow) may also be prescribed.  Asthma severity Asthma is classified as either "intermittent" (comes and goes) or "persistent" (lasting). Persistent asthma is further described as being mild, moderate, or severe. The severity of asthma is based on how often you have symptoms both during the day and night, as well as by the results of lung function tests and by how well you can perform activities. The "severity" of asthma refers to how "intense" or "strong" your asthma is.  Asthma control Asthma control is the goal of asthma treatment. Regardless of your asthma severity, it may or may not be controlled. Asthma control means: You are able to do everything you want to do at work and home  You have no (or minimal) asthma symptoms  You do not wake up from your sleep or earlier than usual in the morning due to asthma  You rarely need to use your reliever medicine (inhaler)  Another major part of your treatment is that you are happy with  your asthma care and believe your asthma is controlled.  Monitoring symptoms A key part of treatment is keeping track of how well your lungs are working. Monitoring your symptoms  what they are, how and when they happen, and how severe they are  is an important part of being able to control your asthma.  Sometimes asthma is monitored using a peak flow meter. A peak flow (PF) meter measures how fast the air comes out of your lungs. It can help you know when your asthma is getting worse, sometimes even before you have symptoms. By taking daily peak flow readings, you can learn when to adjust medications to keep asthma under good control. It is also used to create your asthma  action plan (see below). Your doctor can use your peak flow readings to adjust your treatment plan in some cases.  Asthma Action Plan Based on your history and asthma severity, you and your doctor will develop a care plan called an "asthma action plan." The asthma action plan describes when and how to use your medicines, actions to take when asthma worsens, and when to seek emergency care. Make sure you understand this plan. If you do not, ask your asthma care provider any questions you may have. Your asthma action plan is one of the keys to controlling asthma. Keep it readily available to remind you of what you need to do every day to control asthma and what you need to do when symptoms occur.  Goals of asthma therapy These are the goals of asthma treatment: Live an active, normal life  Prevent chronic and troublesome symptoms  Attend work or school every day  Perform daily activities without difficulty  Stop urgent visits to the doctor, emergency department, or hospital  Use and adjust medications to control asthma with few or no side effects

## 2020-12-09 NOTE — Progress Notes (Signed)
[name]    [id number]    21-Oct-1950  Primary Care Physician:Moreira, Carloyn Manner, MD  Referring Physician: Jilda Panda, MD [address] [place],  [place] [address] Reason for Consultation: abnormal chest xray Date of Consultation: 12/09/2020  Chief complaint:   Chief Complaint  Patient presents with   Consult    Abnormal CXR and CT Heart failure      HPI: [name] is a 70 y.o. woman who presents for new patient evaluation of dyspnea and abnormal CT Chest. Was seen in September 2022 at [facility] for chest pain felt to be unstable angina. LHC was negative. Her blood pressure medications were increased. She notes worsening dyspnea since September. She can no longer get her horses out and saddle, ride and feed them. Dyspnea is at rest and with exertion. Overall she is better since September - slowly improving.   She's had bronchitis several times, but nothing in the last 4 years.   No childhood respiratory disease. When she was 64 she was told she had asthma with exertion.   No fevers, chills, night sweats, weight loss.  No cough, wheezing. She has a hard time exhaling vs inhaling.   She is not currently taking inhalers.  She has previously taken prednisone and this has helped her breathing  Social history:  Occupation: worked as a Marine scientist, retired in 2012 Exposures: lives at home with husband. Have horses and a dog Smoking history: never smoker, A spouse smoked for a few years.   Social History   Occupational History   Not on file  Tobacco Use   Smoking status: Never   Smokeless tobacco: Never  Vaping Use   Vaping Use: Never used  Substance and Sexual Activity   Alcohol use: No   Drug use: No   Sexual activity: Yes    Relevant family history:  Family History  Problem Relation Age of Onset   Heart failure Father    Hypertension Father    Heart attack Father    Heart attack Mother    Breast cancer Mother 79   Heart attack Brother    Heart attack Brother      Past Medical History:  Diagnosis Date   CAD (coronary artery disease)    Complication of anesthesia    trouble waking up once   Diabetes mellitus without complication ([place])    Hypertension    Hypothyroidism    Liver disease    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 fatty liver   Sarcoidosis    Sarcoidosis of lung ([place])    Sarcoidosis of lymph nodes     Past Surgical History:  Procedure Laterality Date   ABDOMINAL HYSTERECTOMY     ABDOMINAL SURGERY     CHOLECYSTECTOMY     HERNIA REPAIR     LEFT HEART CATH AND CORONARY ANGIOGRAPHY N/A 09/23/2020   Procedure: LEFT HEART CATH AND CORONARY ANGIOGRAPHY;  Surgeon: Troy Sine, MD;  Location: [facility];  Service: Cardiovascular;  Laterality: N/A;   nissenfundiplication       Physical Exam: Blood pressure (!) 144/74, pulse 88, temperature 98.8 F (37.1 C), temperature source Oral, height 5\' 2"  (1.575 m), weight 186 lb 3.2 oz (84.5 kg), SpO2 97 %. Gen:      No acute distress ENT:  mallampati IV, no thrush, no nasal polyps, mucus membranes moist Lungs:    No increased respiratory effort, symmetric chest wall excursion, clear to auscultation bilaterally, no wheezes  or crackles CV:         Regular rate and rhythm; no murmurs, rubs, or gallops.  No pedal edema Abd:      + bowel sounds; soft, non-tender; no distension MSK: no acute synovitis of DIP or PIP joints, no mechanics hands.  Skin:      Warm and dry; no rashes Neuro: normal speech, no focal facial asymmetry Psych: alert and oriented x3, normal mood and affect   Data Reviewed/Medical Decision Making:  Independent interpretation of tests: Imaging:  Review of patient's CT Chest images Nov 2022 revealed few ggos. Peribronchial thickening. The patient's images have been independently reviewed by me.    PFTs:  No flowsheet data found.  Labs:  Lab Results  Component Value Date   WBC 5.6 09/25/2020   HGB 10.9 (L) 09/25/2020   HCT 32.7 (L) 09/25/2020   MCV 87.9 09/25/2020    PLT 100 (L) 09/25/2020   Lab Results  Component Value Date   NA 140 10/23/2020   K 4.6 10/23/2020   CL 104 10/23/2020   CO2 22 10/23/2020   In 2010 ln biopsy shows chronic inflammation and granulomas  LHC sept 2022 20% obstructive CAD.   Echocardiogram: Sept 2022  1. Left ventricular ejection fraction, by estimation, is 65 to 70%. The  left ventricle has normal function. The left ventricle has no regional  wall motion abnormalities. Left ventricular diastolic parameters are  consistent with Grade II diastolic  dysfunction (pseudonormalization).   2. Right ventricular systolic function is normal. The right ventricular  size is normal.   3. Left atrial size was mildly dilated.   4. There is systolic anterior motion of the MV leaflet. . The mitral  valve is normal in structure. Moderate mitral valve regurgitation.   5. The aortic valve is normal in structure. Aortic valve regurgitation is  not visualized.   Immunization status:  Immunization History  Administered Date(s) Administered   Fluad Quad(high Dose 65+) 11/23/2020     I reviewed prior external note(s) from cardiology, hospital stay  I reviewed the result(s) of the labs and imaging as noted above.   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 Schexnider has symptoms of dyspnea improved with inhalers and prednisone consistent with asthma. Will start maintenance ICS-LABA with dulera and add prn albuterol.  Will obtain spiro and feno at next visit.  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  She needs a follow up CT scan in May 2023 to ensures resolution - these GGOs look inflammatory in nature and less likely to be malignancy.  Follow up with PCP for cirrhosis.  Had granulomas in LN biopsy in 2010 - her current adenopathy may be reactive or may be related to repeat CT scan.   We discussed disease management and progression at length today.    Return to Care: Return in about 2 months (around 02/08/2021).  Durel Salts, MD Pulmonary and Critical Care Medicine [facility] Office:[phone]  CC: Ralene Ok, MD

## 2020-12-15 ENCOUNTER — Other Ambulatory Visit (HOSPITAL_COMMUNITY): Payer: Self-pay

## 2020-12-15 ENCOUNTER — Telehealth: Payer: Self-pay

## 2020-12-15 NOTE — Telephone Encounter (Signed)
Patient Advocate Encounter  Received request from [facility] for a prior authorization for [facility] 100-75mcg aerosol.     Per Test Claim: $101.83, no PA needed.

## 2020-12-24 NOTE — Progress Notes (Signed)
Office Visit    Patient Name: [name] Date of Encounter: 12/25/2020  PCP:  Jilda Panda, [place]  Cardiologist:  Buford Dresser, MD  Advanced Practice Provider:  No care team member to display Electrophysiologist:  None      Chief Complaint    [name] is a 70 y.o. female with a hx of  hypertension, hyperlipidemia, DM2, nonobstructive CAD, mitral regurgitation, sarcoidosis, LVOT obstruction, LVH, SAM presents today for heart failure follow up  Past Medical History    Past Medical History:  Diagnosis Date   CAD (coronary artery disease)    Complication of anesthesia    trouble waking up once   Diabetes mellitus without complication ([place])    Hypertension    Hypothyroidism    Liver disease    hx tumor in liver, ? fatty liver   Sarcoidosis    Sarcoidosis of lung ([place])    Sarcoidosis of lymph nodes    Past Surgical History:  Procedure Laterality Date   ABDOMINAL HYSTERECTOMY     ABDOMINAL SURGERY     CHOLECYSTECTOMY     HERNIA REPAIR     LEFT HEART CATH AND CORONARY ANGIOGRAPHY N/A 09/23/2020   Procedure: LEFT HEART CATH AND CORONARY ANGIOGRAPHY;  Surgeon: Troy Sine, MD;  Location: [place] [facility];  Service: Cardiovascular;  Laterality: N/A;   nissenfundiplication      Allergies  Allergies  Allergen Reactions   Sulfa Antibiotics Other (See Comments)    High fever Other reaction(s): Fever   Atorvastatin Other (See Comments)    Extreme muscle cramps and muscle aches   Amoxicillin Rash    History of Present Illness    [name] is a 70 y.o. female with a hx of hypertension, hyperlipidemia, DM2, nonobstructive CAD, mitral regurgitation, sarcoidosis, LVOT obstruction, LVH, SAM last seen 10/23/20.  Sarcoid initially diagnosed in 2010.  She was evaluated in the ED 09/22/2020 due to new bilateral foot swelling with systolic blood pressure of 210.  She had atypical chest pain and underwent cardiac catheterization  showing mild coronary artery disease recommended for conservative management.  Her hydrochlorothiazide was discontinued and beta-blocker increased.  Cardiac MRI performed showed no scarring, moderate MR, mitral valve notable for S.A.M. with late peaking regurgitation.  Her antihypertensive regimen was adjusted.  She was seen 10/09/20 with increased shortness of breath noted to be up 15 pounds above dry weight. LE duplex performed due to leg swelling right greater than left which was negative for DVT bilaterally. Her Lasix was increased from 20mg  to 40mg  QD. Seen 10/23/20 weight down 6 lbs and LE edema improved. Lasix 40mg  QD was continued. She contacted the office 11/30/20 noting occasional elevated BP and encouraged to take Hydralazine for SBP >160 and monitor regularly.   She was seen by Dr. Shearon Stalls of pulmonology 12/09/20 and started on [facility] with plans for spirometry at next visit in 2 months.   Presents today for follow up. Tells me she did not start the [facility]. Using rescue inhaler with improvement in symptoms. She has used 2-3 times since seeing pulmonology in November. Encouraged to trial [facility] prior to her next pulmonology visit for at least 1-2 weeks. She feels she is taking lots of medications. Tells me her pulmonologist told her she had cirrhosis and is planning to discuss with primary care. Overall somewhat frustrated by number of medications. Reports no chest pain, pressure, tightness. BP at home in the morning 150s and  then goes down throughout the day but never at goal of <130/80. Re-education provided regarding goal BP as she was under the impression that SBP was at goal. Notes lower extremity edema to her ankles. Does sit with feet elevated. We did discuss that gabapentin can contribute to some lower extremity edema. Heart rate at home usually in the 70s. No palpitations, lightheadedness, dizziness, near syncope, syncope. She is still walking rfor exercise. 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 Plans to get back to riding her horses.    EKGs/Labs/Other Studies Reviewed:   The following studies were reviewed today:  Echocardiogram 09/23/20: 1. Left ventricular ejection fraction, by estimation, is 65 to 70%. The  left ventricle has normal function. The left ventricle has no regional  wall motion abnormalities. Left ventricular diastolic parameters are  consistent with Grade II diastolic  dysfunction (pseudonormalization).   2. Right ventricular systolic function is normal. The right ventricular  size is normal.   3. Left atrial size was mildly dilated.   4. There is systolic anterior motion of the MV leaflet. . The mitral  valve is normal in structure. Moderate mitral valve regurgitation.   5. The aortic valve is normal in structure. Aortic valve regurgitation is  not visualized.    LHC 09/23/20: Prox Cx lesion is 20% stenosed.   Mild nonobstructive CAD with tortuous vessels and smooth 20% narrowing in the proximal circumflex far artery.  The LAD appears angiographically normal.  The right coronary artery is a dominant vessel with superior takeoff.   Mild LV to aorta gradient on pullback with a peak to peak gradient at approximately 16 mmHg.   RECOMMENDATION: Medical therapy.  Cardiac MRI 09/25/20 CONTRAST:  8 cc  of Gadavist   FINDINGS: 1. Mild left ventricular enlargement, with LVEDD 49 mm, but LVEDVi 83 mL/m2.   Abnormal left ventricular thickness, with intraventricular septal thickness of 17 mm, posterior wall thickness of 9 mm, and septal to posterior ratio > 1.5. Myocardial mass index 72 g/m2, mildly increased for gender.   Hyperdynamic left ventricular systolic function (LVEF [id number]). There are no regional wall motion abnormalities. There is systolic anterior motion of the mitral valve.   Left ventricular parametric mapping notable for mild elevation in ECV signal in inferior and inferolateral apex and mid (35 ms at greatest) and normal T2 signal.    There is no late gadolinium enhancement in the left ventricular myocardium (5SD standard used).   2. Normal right ventricular size with RVEDVI 61 mL/m2.   Normal right ventricular thickness.   Normal right ventricular systolic function (RVEF [id number] %). There are no regional wall motion abnormalities or aneurysms.   3.  Normal left and right atrial size.   4. Normal size of the aortic root, ascending aorta. Mild dilation of the pulmonary artery, 29 mm, with PA ratio < 0.9.   5.  Valve evaluation notable for:   Tri-leaflet Aortic valve with mean gradient 2.7 mm Hg and regurgitant volume of -2.4 mL. No significant valve disease   Pulmonic valve mean gradient of 1 mm Hg and regurgitant volume of 0.1 mL No significant valve disease.   Qualitatively no significant tricuspid valve disease.   Mitral valve notable for SAM as above with late peaking regurgitation   Valve regurgitation by LV-RV volumes: 37 mL; regurgitant fraction 34%   Valve regurgitation by LV-AO volumes: 43 mL; regurgitation fraction 39%   Valve regurgitation by LV-PApC volumes: 39 mL; regurgitation fraction 35%   Suggestive of moderate  mitral regurgitation.   6.  Normal pericardium.  No pericardial effusion.   7. Grossly, no extracardiac findings. Recommended dedicated study if concerned for non-cardiac pathology.   IMPRESSION: Asymmetric septal hypertrophy without other evidence of infiltrative disease and systolic anterior motion of the mitral valve: This can be seen in hypertropic obstructive cardiomyopathy.   Moderate mitral regurgitation.   EKG:  No EKG today   Recent Labs: 09/25/2020: Hemoglobin 10.9; Platelets 100 10/23/2020: BNP 56.2; BUN 10; Creatinine, Ser 0.77; Potassium 4.6; Sodium 140  Recent Lipid Panel    Component Value Date/Time   CHOL 116 09/23/2020 0210   TRIG 24 09/23/2020 0210   HDL 48 09/23/2020 0210   CHOLHDL 2.4 09/23/2020 0210   VLDL 5 09/23/2020 0210   LDLCALC 63  09/23/2020 0210    Home Medications   Current Meds  Medication Sig   acetaminophen (TYLENOL) 500 MG tablet Take 500 mg by mouth every 6 (six) hours as needed for mild pain.   acyclovir (ZOVIRAX) 400 MG tablet Take 400 mg by mouth every 8 (eight) hours as needed. Fever blisters   albuterol (VENTOLIN HFA) 108 (90 Base) MCG/ACT inhaler Inhale 2 puffs into the lungs every 6 (six) hours as needed.   aspirin 81 MG chewable tablet Chew and swallow 1 tablet (81 mg total) by mouth daily.   calcium-vitamin D (OSCAL WITH D) 500-200 MG-UNIT tablet Take 1 tablet by mouth daily with breakfast.   carvedilol (COREG) 12.5 MG tablet Take 1 tablet (12.5 mg total) by mouth 2 (two) times daily with a meal.   cetirizine (ZYRTEC) 10 MG tablet Take 10 mg by mouth daily as needed for allergies.   furosemide (LASIX) 20 MG tablet Take 20-40 mg by mouth. Taking 20 mg every Sun., Tues., Thurs., and Sat. Taking 40 mg all other days.   gabapentin (NEURONTIN) 300 MG capsule Take 300 mg by mouth. 2-3 times daily   irbesartan (AVAPRO) 75 MG tablet Take 1 tablet (75 mg total) by mouth daily.   levothyroxine (SYNTHROID, LEVOTHROID) 150 MCG tablet Take 75-150 mcg by mouth daily before breakfast. Pt alternates between 150 mcg and 75 mcg each day   LORazepam (ATIVAN) 1 MG tablet Take 1 mg by mouth 3 (three) times daily as needed for anxiety.   metFORMIN (GLUCOPHAGE-XR) 500 MG 24 hr tablet Take 2 tablets (1,000 mg total) by mouth 2 (two) times daily.   oxyCODONE-acetaminophen (PERCOCET) 5-325 MG tablet Take 1-2 tablets by mouth every 6 (six) hours as needed.   OZEMPIC, 1 MG/DOSE, 4 MG/3ML SOPN Inject 1 mg into the skin once a week. Monday   rosuvastatin (CRESTOR) 10 MG tablet Take 10 mg by mouth daily.   zolpidem (AMBIEN) 10 MG tablet Take 10 mg by mouth at bedtime as needed for sleep.   [DISCONTINUED] furosemide (LASIX) 20 MG tablet Take 20 mg by mouth daily as needed for fluid or edema.    [DISCONTINUED] hydrALAZINE (APRESOLINE)  25 MG tablet Take 25 mg by mouth 2 (two) times daily.   [DISCONTINUED] hydrALAZINE (APRESOLINE) 25 MG tablet Take 25 mg by mouth 3 (three) times daily.     Review of Systems      All other systems reviewed and are otherwise negative except as noted above.  Physical Exam    VS:  BP 140/72 (BP Location: Right Arm, Patient Position: Sitting, Cuff Size: Normal)   Pulse 75   Ht 5\' 2"  (1.575 m)   Wt 183 lb (83 kg)   BMI 33.47 kg/m  ,  BMI Body mass index is 33.47 kg/m.  Wt Readings from Last 3 Encounters:  12/25/20 183 lb (83 kg)  12/09/20 186 lb 3.2 oz (84.5 kg)  10/23/20 179 lb 9.6 oz (81.5 kg)     GEN: Well nourished, well developed, in no acute distress. HEENT: normal. Neck: Supple, no JVD, carotid bruits, or masses. Cardiac: RRR, no  rubs, or gallops. Murmur appreciated. No clubbing, cyanosis. Trace bilateral pedal edema.  Respiratory:  Respirations regular and unlabored, clear to auscultation bilaterally. GI: Soft, nontender, nondistended. MS: No deformity or atrophy. Skin: Warm and dry, no rash. Neuro:  Strength and sensation are intact. Psych: Normal affect.  Assessment & Plan    Nonobstructive CAD- Stable with no anginal symptoms. No indication for ischemic evaluation. GDMT includes aspirin, coreg, rosuvastatin. Heart healthy diet and regular cardiovascular exercise encouraged.    DOE - Likely multifactorial HFpEF, deconditioning, LVH, asthma. Following with pulmonology due to asthma. Encouraged to take [facility] as prescribed. Will increase BP regimen, as detailed below.   HFpEF - Weight today up 4 lbs over last 2 months. Taking Lasix 40mg  and 20mg  QOD. Reports only mild lge edema. Low sodium diet and fluid restriction <2L encouraged. She has been making dietary changes. GDMT includes Carvedilol, Irbesartan, Furosemide. Hesitant to further increase diuretic dose due to known LVOT. Heart healthy diet and regular cardiovascular exercise encouraged.    LE edema - LE duplex  10/09/20 with no DVT bilaterally. Amlodipine previously discontinued as concern it was contributory.  Continued elevation of lower extremities and low salt diet encouraged.    Hypertension - BP not at goal. Education provided regarding BP goal <130/80. Continue Coreg 12.5mg  BID. Continue Irbesartan 75mg  QD. Increase Hydralazine to 25mg  TID. She will report BP in one week via MyChart. If not at goal, plan to increase to 50mg  TID. She prefers to adjust hydralazine rather than irbesartan at this time.  LVOT/LVH/S.A.M./ MR -Echo 09/2020 with S.A.M. with moderate MR and c/f LVOT.  Cardiac MRI during recent admission detailed above. Careful titration of diuretics and antihypertensives. Future considerations include addition of Verapamil for LVOT benefit. Defer today as she prefers not to add another medication.   HLD, LDL goal less than 70-LDL 63 most recently.  Continue present dose Crestor.  DM2 - Most recent A1c 5.8. Follows with primary care.   Disposition: Follow up  in 6 months with Dr. Harrell Gave or APP.  Signed, Loel Dubonnet, NP 12/25/2020, 11:18 AM [place]

## 2020-12-25 ENCOUNTER — Telehealth: Payer: Self-pay | Admitting: Internal Medicine

## 2020-12-25 ENCOUNTER — Encounter (HOSPITAL_BASED_OUTPATIENT_CLINIC_OR_DEPARTMENT_OTHER): Payer: Self-pay | Admitting: Family

## 2020-12-25 ENCOUNTER — Ambulatory Visit (HOSPITAL_BASED_OUTPATIENT_CLINIC_OR_DEPARTMENT_OTHER): Payer: Medicare Other | Admitting: Family

## 2020-12-25 ENCOUNTER — Other Ambulatory Visit: Payer: Self-pay

## 2020-12-25 VITALS — BP 140/72 | HR 75 | Ht 62.0 in | Wt 183.0 lb

## 2020-12-25 DIAGNOSIS — I517 Cardiomegaly: Secondary | ICD-10-CM | POA: Diagnosis not present

## 2020-12-25 DIAGNOSIS — I10 Essential (primary) hypertension: Secondary | ICD-10-CM | POA: Diagnosis not present

## 2020-12-25 DIAGNOSIS — Q248 Other specified congenital malformations of heart: Secondary | ICD-10-CM

## 2020-12-25 DIAGNOSIS — R600 Localized edema: Secondary | ICD-10-CM

## 2020-12-25 DIAGNOSIS — I340 Nonrheumatic mitral (valve) insufficiency: Secondary | ICD-10-CM

## 2020-12-25 DIAGNOSIS — I25118 Atherosclerotic heart disease of native coronary artery with other forms of angina pectoris: Secondary | ICD-10-CM

## 2020-12-25 DIAGNOSIS — I5032 Chronic diastolic (congestive) heart failure: Secondary | ICD-10-CM

## 2020-12-25 MED ORDER — HYDRALAZINE HCL 25 MG PO TABS: 25.0000 mg | ORAL_TABLET | Freq: Three times a day (TID) | ORAL | 3 refills | Status: DC

## 2020-12-25 NOTE — Telephone Encounter (Signed)
Called and spoke with pt who wants to know if she can switch from [facility] to Advair as the cost of the [facility] is too expensive for her and Advair is covered by insurance. Dr. Celine Mans, please advise.

## 2020-12-25 NOTE — Patient Instructions (Signed)
Medication Instructions:  Your physician has recommended you make the following change in your medication:   Change: Pleas start taking your Hydralazine ( Apresoline) 25mg  tablet 3 times per day everyday   *If you need a refill on your cardiac medications before your next appointment, please call your pharmacy*   Lab Work: None ordered today    Testing/Procedures: None ordered today    Follow-Up: At [facility], you and your health needs are our priority.  As part of our continuing mission to provide you with exceptional heart care, we have created designated Provider Care Teams.  These Care Teams include your primary Cardiologist (physician) and Advanced Practice Providers (APPs -  Physician Assistants and Nurse Practitioners) who all work together to provide you with the care you need, when you need it.  We recommend signing up for the patient portal called "MyChart".  Sign up information is provided on this After Visit Summary.  MyChart is used to connect with patients for Virtual Visits (Telemedicine).  Patients are able to view lab/test results, encounter notes, upcoming appointments, etc.  Non-urgent messages can be sent to your provider as well.   To learn more about what you can do with MyChart, go to [facility].    Your next appointment:   6 month(s)  The format for your next appointment:   In Person  Provider:   [email or web address], MD, Jodelle Red, NP, or Gillian Shields, NP    Other Instructions Tips to Measure your Blood Pressure Correctly  Our goal is for your blood pressure to be less than 130/80. If it is consistent more than that, please contact our office and we will consider further adjusting your Hydralazine.  National and international guidelines offer specific instructions for measuring blood pressure. If a doctor, nurse, or medical assistant isn't doing it right, don't hesitate to ask him or her to get with the guidelines.  Here's what  you can do to ensure a correct reading:  Don't drink a caffeinated beverage or smoke during the 30 minutes before the test.  Sit quietly for five minutes before the test begins.  During the measurement, sit in a chair with your feet on the floor and your arm supported so your elbow is at about heart level.  The inflatable part of the cuff should completely cover at least 80% of your upper arm, and the cuff should be placed on bare skin, not over a shirt.  Don't talk during the measurement.  Have your blood pressure measured twice, with a brief break in between. If the readings are different by 5 points or more, have it done a third time.

## 2020-12-26 ENCOUNTER — Ambulatory Visit
Admission: RE | Admit: 2020-12-26 | Discharge: 2020-12-26 | Disposition: A | Discharge status: Home / Self Care | Payer: Medicare Other | Source: Ambulatory Visit | Attending: Internal Medicine | Admitting: Internal Medicine

## 2020-12-26 DIAGNOSIS — N6489 Other specified disorders of breast: Secondary | ICD-10-CM

## 2020-12-26 MED ORDER — ADVAIR HFA 115-21 MCG/ACT IN AERO: 2.0000 | INHALATION_SPRAY | Freq: Two times a day (BID) | RESPIRATORY_TRACT | 12 refills | Status: DC

## 2020-12-26 NOTE — Telephone Encounter (Signed)
Attempted to call pt to let her know that [facility] sent Rx for Advair to pharmacy in place of [facility] but unable to reach. Left pt a detailed message letting her know this had been done. Nothing further needed.

## 2021-01-05 ENCOUNTER — Encounter (HOSPITAL_BASED_OUTPATIENT_CLINIC_OR_DEPARTMENT_OTHER): Payer: Self-pay

## 2021-02-10 MED ORDER — HYDRALAZINE HCL 50 MG PO TABS: 50.0000 mg | ORAL_TABLET | Freq: Three times a day (TID) | ORAL | 3 refills | Status: DC

## 2021-02-12 ENCOUNTER — Ambulatory Visit: Payer: Medicare Other | Admitting: Internal Medicine

## 2021-02-12 ENCOUNTER — Other Ambulatory Visit: Payer: Self-pay

## 2021-02-12 ENCOUNTER — Encounter: Payer: Self-pay | Admitting: Internal Medicine

## 2021-02-12 VITALS — BP 126/76 | HR 80 | Temp 98.1°F | Ht 62.0 in | Wt 181.8 lb

## 2021-02-12 DIAGNOSIS — R911 Solitary pulmonary nodule: Secondary | ICD-10-CM | POA: Diagnosis not present

## 2021-02-12 DIAGNOSIS — J4540 Moderate persistent asthma, uncomplicated: Secondary | ICD-10-CM

## 2021-02-12 DIAGNOSIS — D869 Sarcoidosis, unspecified: Secondary | ICD-10-CM | POA: Diagnosis not present

## 2021-02-12 LAB — CBC WITH DIFFERENTIAL/PLATELET
Basophils Absolute: 0 10*3/uL (ref 0.0–0.1)
Basophils Relative: 1 % (ref 0.0–3.0)
Eosinophils Absolute: 0.1 10*3/uL (ref 0.0–0.7)
Eosinophils Relative: 3.5 % (ref 0.0–5.0)
HCT: 36 % (ref 36.0–46.0)
Hemoglobin: 11.8 g/dL — ABNORMAL LOW (ref 12.0–15.0)
Lymphocytes Relative: 13 % (ref 12.0–46.0)
Lymphs Abs: 0.5 10*3/uL — ABNORMAL LOW (ref 0.7–4.0)
MCHC: 32.8 g/dL (ref 30.0–36.0)
MCV: 84.5 fl (ref 78.0–100.0)
Monocytes Absolute: 0.3 10*3/uL (ref 0.1–1.0)
Monocytes Relative: 8.2 % (ref 3.0–12.0)
Neutro Abs: 3.1 10*3/uL (ref 1.4–7.7)
Neutrophils Relative %: 74.3 % (ref 43.0–77.0)
Platelets: 78 10*3/uL — ABNORMAL LOW (ref 150.0–400.0)
RBC: 4.26 Mil/uL (ref 3.87–5.11)
RDW: 14.9 % (ref 11.5–15.5)
WBC: 4.2 10*3/uL (ref 4.0–10.5)

## 2021-02-12 LAB — COMPREHENSIVE METABOLIC PANEL
ALT: 32 U/L (ref 0–35)
AST: 43 U/L — ABNORMAL HIGH (ref 0–37)
Albumin: 3.8 g/dL (ref 3.5–5.2)
Alkaline Phosphatase: 95 U/L (ref 39–117)
BUN: 17 mg/dL (ref 6–23)
CO2: 24 mEq/L (ref 19–32)
Calcium: 9.9 mg/dL (ref 8.4–10.5)
Chloride: 106 mEq/L (ref 96–112)
Creatinine, Ser: 0.7 mg/dL (ref 0.40–1.20)
GFR: 86.4 mL/min (ref >60.00)
Glucose, Bld: 90 mg/dL (ref 70–99)
Potassium: 3.8 mEq/L (ref 3.5–5.1)
Sodium: 137 mEq/L (ref 135–145)
Total Bilirubin: 0.8 mg/dL (ref 0.2–1.2)
Total Protein: 7.2 g/dL (ref 6.0–8.3)

## 2021-02-12 NOTE — Progress Notes (Signed)
[name]    [id number]    [birth year]  Primary Care Physician:Moreira, Channing Mutters, MD Date of Appointment: 02/12/2021 Established Patient Visit  Chief complaint:   Chief Complaint  Patient presents with   Follow-up    She feels that she has non productive cough. She feel more short of breath with exertion and colder weather.      HPI: [name] is a 71 y.o. woman with shortness of breath likely asthma. Has significant cardiac history including HTN, Mitral regurgitation, LVOT, LVH and SAM. 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 She had covid infection about a month ago. Now improved   Had started advair [facility] after visit with prn albuterol She had spirometry and feno today which shows normal pulmonary function.   She has started the advair but hasn't noticed any change yet. Takes it once a day.  Prednisone has helped her breathing in the past.  Rescue inhaler she takes 2-3 times/day on some days, some days not at all.   Feels overall that it's harder to exhale than inhale.   Her voice has become more hoarse. She takes zyrtec for post nasal drainage.  She does not take any nasal sprays. She sometimes does a neti pot.   Has had some trouble with BP medication and has had changes made to medicines.   She isn't sure if her symptoms are related to her heart or her lungs.  ACT:  Asthma Control Test ACT Total Score  02/12/2021 13   FeNO: unable to complete test  I have reviewed the patient's family social and past medical history and updated as appropriate.   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 fatty liver   Sarcoidosis    Sarcoidosis of lung ([facility])     Sarcoidosis of lymph nodes     Past Surgical History:  Procedure Laterality Date   ABDOMINAL HYSTERECTOMY     ABDOMINAL SURGERY     CHOLECYSTECTOMY     HERNIA REPAIR     LEFT HEART CATH AND CORONARY ANGIOGRAPHY N/A 09/23/2020   Procedure: LEFT HEART CATH AND CORONARY ANGIOGRAPHY;  Surgeon: Lennette Bihari, MD;  Location: [facility];  Service: Cardiovascular;  Laterality: N/A;   nissenfundiplication      Family History  Problem Relation Age of Onset   Heart failure Father    Hypertension Father    Heart attack Father    Heart attack Mother    Breast cancer Mother 21   Heart attack Brother    Heart attack Brother     Social History   Occupational History   Not on file  Tobacco Use   Smoking status: Never   Smokeless tobacco: Never  Vaping Use   Vaping Use: Never used  Substance and Sexual Activity   Alcohol use: No   Drug use: No   Sexual activity: Yes    Physical Exam: Blood pressure 126/76, pulse 80, temperature 98.1 F (36.7 C), temperature source Oral, height 5\' 2"  (1.575 m), weight 181 lb 12.8 oz (82.5 kg), SpO2 97 %.  Gen:  No acute distress ENT:  no nasal polyps, mucus membranes moist Lungs:    No increased respiratory effort, symmetric chest wall excursion, clear to auscultation bilaterally, no wheezes or crackles CV:         Regular rate and rhythm; no murmurs, rubs, or gallops.  No pedal edema   Data Reviewed: Imaging: I have personally reviewed the CT Chest  PFTs: No flowsheet data found. I have personally reviewed the patient's PFTs and spirometry 02/12/2021 shows normal pulmonary function.  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 Possible that symptoms are related to sarcoidosis - she had a paraesophageal lymph node positive for non-caseating granulomas.  I'm also not sure if her symptoms are related to her significant cardiac disease   Will obtain sarcoidosis labs: Baseline serum creatinine for renal sarcoidosis Baseline serum alkaline phosphatase for hepatic sarcoidosis Baseline serum calcium Baseline serum complete blood count  She will need: Baseline eye examination to evaluate for ocular sarcoidosis   Return to Care: Return in about 6 weeks (around 03/26/2021).   Durel Salts, MD Pulmonary and Critical Care Medicine [facility] Office:[phone]

## 2021-02-12 NOTE — Patient Instructions (Addendum)
Please schedule follow up scheduled with myself in 6 weeks.  If my schedule is not open yet, we will contact you with a reminder closer to that time. Please call [phone] if you haven't heard from [place] a month before.   Before your next visit I would like you to have: Full set of PFTs - prior to next  Repeat CT scan chest. We will call to schedule.  Get labs today  Stop advair since it isn't helping. Ok to continue albuterol.

## 2021-02-18 ENCOUNTER — Telehealth (HOSPITAL_BASED_OUTPATIENT_CLINIC_OR_DEPARTMENT_OTHER): Payer: Self-pay

## 2021-02-18 ENCOUNTER — Encounter (HOSPITAL_BASED_OUTPATIENT_CLINIC_OR_DEPARTMENT_OTHER): Payer: Self-pay

## 2021-02-18 DIAGNOSIS — I10 Essential (primary) hypertension: Secondary | ICD-10-CM

## 2021-02-18 DIAGNOSIS — I5032 Chronic diastolic (congestive) heart failure: Secondary | ICD-10-CM

## 2021-02-18 MED ORDER — IRBESARTAN 150 MG PO TABS: 150.0000 mg | ORAL_TABLET | Freq: Every day | ORAL | 11 refills | Status: DC

## 2021-02-18 MED ORDER — HYDRALAZINE HCL 25 MG PO TABS: 25.0000 mg | ORAL_TABLET | Freq: Three times a day (TID) | ORAL | 11 refills | Status: DC

## 2021-02-18 NOTE — Telephone Encounter (Signed)
Called patient and was told by husband that she was sleeping! He asked if she could call us back when she woke up. RN told him that was perfectly fine and will be happy to speak with her! Will also respond to her mychart message with updated and number to call for questions!   Orders placed and lap slips mailed to patient!     "Recommend reduce Hydralazine to 25mg  TID. Increase Irbesartan to 150mg  daily. Please send 30 day supply with refills. Repeat BMP in 2 weeks with monitoring and ask her to please send updated BP log in two weeks. Check once per day at least 2 hours after medications after resting 5-10 minutes.    , NP"

## 2021-02-18 NOTE — Telephone Encounter (Signed)
Please advise 

## 2021-02-18 NOTE — Telephone Encounter (Signed)
Recommend reduce Hydralazine to 25mg  TID. Increase Irbesartan to 150mg  daily. Please send 30 day supply with refills. Repeat BMP in 2 weeks with monitoring and ask her to please send updated BP log in two weeks. Check once per day at least 2 hours after medications after resting 5-10 minutes.   , NP

## 2021-02-25 ENCOUNTER — Encounter (HOSPITAL_BASED_OUTPATIENT_CLINIC_OR_DEPARTMENT_OTHER): Payer: Self-pay

## 2021-02-27 ENCOUNTER — Encounter (HOSPITAL_BASED_OUTPATIENT_CLINIC_OR_DEPARTMENT_OTHER): Payer: Self-pay

## 2021-03-04 ENCOUNTER — Encounter (HOSPITAL_BASED_OUTPATIENT_CLINIC_OR_DEPARTMENT_OTHER): Payer: Self-pay

## 2021-03-04 LAB — BASIC METABOLIC PANEL
BUN/Creatinine Ratio: 19 (ref 12–28)
BUN: 13 mg/dL (ref 8–27)
CO2: 22 mmol/L (ref 20–29)
Calcium: 9.5 mg/dL (ref 8.7–10.3)
Chloride: 106 mmol/L (ref 96–106)
Creatinine, Ser: 0.69 mg/dL (ref 0.57–1.00)
Glucose: 101 mg/dL — ABNORMAL HIGH (ref 70–99)
Potassium: 3.8 mmol/L (ref 3.5–5.2)
Sodium: 140 mmol/L (ref 134–144)
eGFR: 92 mL/min/{1.73_m2} (ref >59)

## 2021-03-04 NOTE — Telephone Encounter (Signed)
Blood pressure log as requested!

## 2021-03-05 ENCOUNTER — Telehealth (HOSPITAL_BASED_OUTPATIENT_CLINIC_OR_DEPARTMENT_OTHER): Payer: Self-pay

## 2021-03-05 NOTE — Telephone Encounter (Addendum)
Results called to patient!   ----- Message from Alver Sorrow, NP sent at 03/05/2021  8:59 AM EST ----- Normal kidney function and electrolytes. Great result! Continue current medications.

## 2021-03-06 ENCOUNTER — Ambulatory Visit
Admission: RE | Admit: 2021-03-06 | Discharge: 2021-03-06 | Disposition: A | Discharge status: Home / Self Care | Payer: Medicare Other | Source: Ambulatory Visit | Attending: Internal Medicine | Admitting: Internal Medicine

## 2021-03-06 DIAGNOSIS — R911 Solitary pulmonary nodule: Secondary | ICD-10-CM

## 2021-03-19 ENCOUNTER — Encounter (HOSPITAL_BASED_OUTPATIENT_CLINIC_OR_DEPARTMENT_OTHER): Payer: Self-pay

## 2021-03-19 NOTE — Telephone Encounter (Signed)
Please advise 

## 2021-03-27 ENCOUNTER — Ambulatory Visit: Payer: Medicare Other | Admitting: Internal Medicine

## 2021-03-30 ENCOUNTER — Ambulatory Visit (HOSPITAL_BASED_OUTPATIENT_CLINIC_OR_DEPARTMENT_OTHER): Payer: Medicare Other | Admitting: Cardiology

## 2021-03-30 ENCOUNTER — Encounter (HOSPITAL_BASED_OUTPATIENT_CLINIC_OR_DEPARTMENT_OTHER): Payer: Self-pay | Admitting: Cardiology

## 2021-03-30 ENCOUNTER — Other Ambulatory Visit: Payer: Self-pay

## 2021-03-30 VITALS — BP 160/72 | HR 78 | Ht 62.0 in | Wt 172.6 lb

## 2021-03-30 DIAGNOSIS — I340 Nonrheumatic mitral (valve) insufficiency: Secondary | ICD-10-CM | POA: Diagnosis not present

## 2021-03-30 DIAGNOSIS — I3489 Other nonrheumatic mitral valve disorders: Secondary | ICD-10-CM | POA: Diagnosis not present

## 2021-03-30 DIAGNOSIS — E1159 Type 2 diabetes mellitus with other circulatory complications: Secondary | ICD-10-CM

## 2021-03-30 DIAGNOSIS — I2510 Atherosclerotic heart disease of native coronary artery without angina pectoris: Secondary | ICD-10-CM | POA: Diagnosis not present

## 2021-03-30 DIAGNOSIS — I10 Essential (primary) hypertension: Secondary | ICD-10-CM

## 2021-03-30 MED ORDER — CARVEDILOL 25 MG PO TABS: 25.0000 mg | ORAL_TABLET | Freq: Two times a day (BID) | ORAL | 3 refills | Status: DC

## 2021-03-30 NOTE — Patient Instructions (Signed)
Medication Instructions:  ?1). INCREASE: Carvedilol to 25 mg twice a day ?  ?*If you need a refill on your cardiac medications before your next appointment, please call your pharmacy* ? ? ?Lab Work: ?None ordered today ? ? ?Testing/Procedures: ?None ordered today ? ? ?Follow-Up: ?At [facility], you and your health needs are our priority.  As part of our continuing mission to provide you with exceptional heart care, we have created designated Provider Care Teams.  These Care Teams include your primary Cardiologist (physician) and Advanced Practice Providers (APPs -  Physician Assistants and Nurse Practitioners) who all work together to provide you with the care you need, when you need it. ? ?We recommend signing up for the patient portal called "MyChart".  Sign up information is provided on this After Visit Summary.  MyChart is used to connect with patients for Virtual Visits (Telemedicine).  Patients are able to view lab/test results, encounter notes, upcoming appointments, etc.  Non-urgent messages can be sent to your provider as well.   ?To learn more about what you can do with MyChart, go to [email or web address].   ? ?Your next appointment:   ?June 25, 2021 @ 8:20 am ? ?The format for your next appointment:   ?In Person ? ?Provider:   ?Buford Dresser, MD{ ? ? ? ?

## 2021-03-30 NOTE — Progress Notes (Signed)
Cardiology Office Note:    Date:  03/30/2021   ID:  [name], DOB 12-17-1950, MRN [id number]  PCP:  Jilda Panda, MD  Cardiologist:  Buford Dresser, MD PhD  Referring MD: Jilda Panda, MD   CC: follow-up  History of Present Illness:    [name] is a 71 y.o. female with a hx of type II diabetes, hypertension, sarcoidosis who is seen as a new consult at the request of Jilda Panda, MD for follow-up. She was initially seen 02/08/18 for the evaluation and management of chest pain.  Today:  She is doing better that she was last visit. He blood pressure has been lower. Initially, her pressure at home ranged from [id number] to [id number] systolic which caused headaches. She records her blood pressure once a day. However, she reports she will hold her arm out rather than laying it down. Recently, her blood pressure measurements average 0000000 systolic and she has not noticed the headaches.  She endorses occasional LE swelling.   Recently, she is trying to increase her activity to lose weight. However, her blood pressure will increase with activity which causes her to feel short of breath. She does not watch Nascar anymore because the racing will stress her and raise her blood pressure.  She takes her irbesartan, carvedilol, and hydralazine in the morning at 7 AM. She takes her hydralazine and carvedilol at 4 PM and 7 PM respectively. She feels fine on this regimen.  Denies chest pain. No PND, orthopnea, or unexpected weight gain. No syncope or palpitations.  Past Medical History:  Diagnosis Date   CAD (coronary artery disease)    Complication of anesthesia    trouble waking up once   Diabetes mellitus without complication ([place])    Hypertension    Hypothyroidism    Liver disease    hx tumor in liver, ? fatty liver   Sarcoidosis    Sarcoidosis of lung ([place])    Sarcoidosis of lymph nodes     Past Surgical History:  Procedure Laterality Date   ABDOMINAL HYSTERECTOMY     ABDOMINAL  SURGERY     CHOLECYSTECTOMY     HERNIA REPAIR     LEFT HEART CATH AND CORONARY ANGIOGRAPHY N/A 09/23/2020   Procedure: LEFT HEART CATH AND CORONARY ANGIOGRAPHY;  Surgeon: Troy Sine, MD;  Location: [facility];  Service: Cardiovascular;  Laterality: N/A;   nissenfundiplication      Current Medications: Current Outpatient Medications on File Prior to Visit  Medication Sig   acetaminophen (TYLENOL) 500 MG tablet Take 500 mg by mouth every 6 (six) hours as needed for mild pain.   acyclovir (ZOVIRAX) 400 MG tablet Take 400 mg by mouth every 8 (eight) hours as needed. Fever blisters   albuterol (VENTOLIN HFA) 108 (90 Base) MCG/ACT inhaler Inhale 2 puffs into the lungs every 6 (six) hours as needed.   aspirin 81 MG chewable tablet Chew and swallow 1 tablet (81 mg total) by mouth daily.   calcium-vitamin D (OSCAL WITH D) 500-200 MG-UNIT tablet Take 1 tablet by mouth daily with breakfast.   cetirizine (ZYRTEC) 10 MG tablet Take 10 mg by mouth daily as needed for allergies.   furosemide (LASIX) 20 MG tablet Take 20-40 mg by mouth. Taking 20 mg every Sun., Tues., Thurs., and Sat. Taking 40 mg all other days.   gabapentin (NEURONTIN) 300 MG capsule Take 300 mg by mouth. 2-3 times daily   hydrALAZINE (APRESOLINE) 25 MG tablet Take 1 tablet (  25 mg total) by mouth 3 (three) times daily.   irbesartan (AVAPRO) 150 MG tablet Take 1 tablet (150 mg total) by mouth daily.   levothyroxine (SYNTHROID, LEVOTHROID) 150 MCG tablet Take 75-150 mcg by mouth daily before breakfast. Pt alternates between 150 mcg and 75 mcg each day   LORazepam (ATIVAN) 1 MG tablet Take 1 mg by mouth 3 (three) times daily as needed for anxiety.   metFORMIN (GLUCOPHAGE-XR) 500 MG 24 hr tablet Take 2 tablets (1,000 mg total) by mouth 2 (two) times daily.   oxyCODONE-acetaminophen (PERCOCET) 5-325 MG tablet Take 1-2 tablets by mouth every 6 (six) hours as needed.   OZEMPIC, 1 MG/DOSE, 4 MG/3ML SOPN Inject 1 mg into the skin once a  week. Monday   rosuvastatin (CRESTOR) 10 MG tablet Take 10 mg by mouth daily.   zolpidem (AMBIEN) 10 MG tablet Take 10 mg by mouth at bedtime as needed for sleep.   No current facility-administered medications on file prior to visit.     Allergies:   Sulfa antibiotics, Atorvastatin, and Amoxicillin   Social History   Socioeconomic History   Marital status: Married    Spouse name: Not on file   Number of children: Not on file   Years of education: Not on file   Highest education level: Not on file  Occupational History   Not on file  Tobacco Use   Smoking status: Never   Smokeless tobacco: Never  Vaping Use   Vaping Use: Never used  Substance and Sexual Activity   Alcohol use: No   Drug use: No   Sexual activity: Yes  Other Topics Concern   Not on file  Social History Narrative   Not on file   Social Determinants of Health   Financial Resource Strain: Not on file  Food Insecurity: Not on file  Transportation Needs: Not on file  Physical Activity: Not on file  Stress: Not on file  Social Connections: Not on file   Former nurse, 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  Family History: The patient's family history includes Breast cancer (age of onset: 35) in her mother; Heart attack in her brother, brother, father, and mother; Heart failure in her father; Hypertension in her father.  ROS:   Please see the history of present illness.  Additional pertinent ROS: (+) LE edema (+) Shortness of breath  EKGs/Labs/Other Studies Reviewed:    The following studies were reviewed today: Cardiac MRI 09/25/20 IMPRESSION: Asymmetric septal hypertrophy without other evidence of infiltrative disease and systolic anterior motion of the mitral valve: This can be seen in hypertropic obstructive cardiomyopathy.   Moderate mitral regurgitation.  L Heart Cath 09/23/20   Prox Cx lesion is 20% stenosed.   Mild nonobstructive CAD with tortuous vessels and smooth 20%  narrowing in the proximal circumflex far artery.  The LAD appears angiographically normal.  The right coronary artery is a dominant vessel with superior takeoff.   Mild LV to aorta gradient on pullback with a peak to peak gradient at approximately 16 mmHg.   RECOMMENDATION: Medical therapy.  Echo 09/23/20  1. Left ventricular ejection fraction, by estimation, is 65 to 70%. The  left ventricle has normal function. The left ventricle has no regional  wall motion abnormalities. Left ventricular diastolic parameters are  consistent with Grade II diastolic  dysfunction (pseudonormalization).   2. Right ventricular systolic function is normal. The right ventricular  size is normal.   3. Left atrial  size was mildly dilated.   4. There is systolic anterior motion of the MV leaflet. . The mitral  valve is normal in structure. Moderate mitral valve regurgitation.   5. The aortic valve is normal in structure. Aortic valve regurgitation is  not visualized.   Monitor 02/23/18 3 days of data recorded on Zio monitor. Patient had a min HR of 58 bpm, max HR of 129 bpm, and avg HR of 75 bpm. Predominant underlying rhythm was Sinus Rhythm. No VT, SVT, atrial fibrillation, high degree block, or pauses noted. Isolated atrial ectopy was rare (<1%). Overall PVC burden was 4.8%, with rare couplets. Longest ventricular bigeminy episode was 11 seconds, and longest ventricular trigeminy episode was 1 minute 50 seconds. There were no patient triggered events.   Lexiscan 02/23/18 Nuclear stress EF: 63%. The study is normal. This is a low risk study. The left ventricular ejection fraction is normal (55-65%).   Normal stress nuclear study with no ischemia or infarction; EF 63 with normal wall motion.   Echo 2014 Left ventricle: The cavity size was normal. Wall thickness   was increased in a pattern of moderate LVH. Systolic   function was vigorous. The estimated ejection fraction was   in the range of 65% to 70%.  Wall motion was normal; there   were no regional wall motion abnormalities. - Mitral valve: Mild regurgitation.  EKG:  EKG was not ordered today 02/08/18: normal sinus rhythm, borderline LVH criteria  Recent Labs: 10/23/2020: BNP 56.2 02/12/2021: ALT 32; Hemoglobin 11.8; Platelets 78.0 03/04/2021: BUN 13; Creatinine, Ser 0.69; Potassium 3.8; Sodium 140  Recent Lipid Panel    Component Value Date/Time   CHOL 116 09/23/2020 0210   TRIG 24 09/23/2020 0210   HDL 48 09/23/2020 0210   CHOLHDL 2.4 09/23/2020 0210   VLDL 5 09/23/2020 0210   LDLCALC 63 09/23/2020 0210    Physical Exam:    VS:  BP (!) 160/72 (BP Location: Left Arm, Patient Position: Sitting, Cuff Size: Normal)    Pulse 78    Ht 5\' 2"  (1.575 m)    Wt 172 lb 9.6 oz (78.3 kg)    SpO2 99%    BMI 31.57 kg/m     Wt Readings from Last 3 Encounters:  03/30/21 172 lb 9.6 oz (78.3 kg)  02/12/21 181 lb 12.8 oz (82.5 kg)  12/25/20 183 lb (83 kg)     GEN: Well nourished, well developed in no acute distress HEENT: Normal NECK: No JVD; No carotid bruits LYMPHATICS: No lymphadenopathy CARDIAC: regular rhythm, normal S1 and S2, no rubs, gallops. 2/6 holosytolic murmur, does not change with valsalva. Radial and DP pulses 2+ bilaterally. RESPIRATORY:  Clear to auscultation without rales, wheezing or rhonchi  ABDOMEN: Soft, non-tender, non-distended MUSCULOSKELETAL:  No edema; No deformity  SKIN: Warm and dry NEUROLOGIC:  Alert and oriented x 3 PSYCHIATRIC:  Normal affect   ASSESSMENT:    1. Essential hypertension   2. Coronary artery disease involving native coronary artery of native heart without angina pectoris   3. Systolic anterior movement of mitral valve   4. Nonrheumatic mitral valve regurgitation   5. Type 2 diabetes mellitus with other circulatory complication, without long-term current use of insulin (HCC)     PLAN:    Nonobstructive CAD by cath Type II diabetes -no angina -continue aspirin, rosuvastatin -on  semaglutide (GLP1RA)  Mild aortic stenosis Moderate mitral regurgitation, systolic anterior motion of the mitral valve with asymmetric septal hypertrophy Hypertension -murmur today did not  change with valsalva -blood pressure above goal today -heart rate is 70s-80s -given SAM/LVOT flow, will increase beta blocker. Will see if this improves her shortness of breath on exertion as well -continue furosemide, irbesartan, hydralazine. Avoid dehydration/hyperdynamic LV given septal hypertrophy/SAM -no syncope -home blood pressure monitoring, review at follow up  Plan for follow up: 3 months or sooner based as needed  Medication Adjustments/Labs and Tests Ordered: Current medicines are reviewed at length with the patient today.  Concerns regarding medicines are outlined above.  No orders of the defined types were placed in this encounter.  Meds ordered this encounter  Medications   carvedilol (COREG) 25 MG tablet    Sig: Take 1 tablet (25 mg total) by mouth 2 (two) times daily with a meal.    Dispense:  180 tablet    Refill:  3    Please wait to fill until patient calls    Patient Instructions  Medication Instructions:  1). INCREASE: Carvedilol to 25 mg twice a day   *If you need a refill on your cardiac medications before your next appointment, please call your pharmacy*   Lab Work: None ordered today   Testing/Procedures: None ordered today   Follow-Up: At [facility], you and your health needs are our priority.  As part of our continuing mission to provide you with exceptional heart care, we have created designated Provider Care Teams.  These Care Teams include your primary Cardiologist (physician) and Advanced Practice Providers (APPs -  Physician Assistants and Nurse Practitioners) who all work together to provide you with the care you need, when you need it.  We recommend signing up for the patient portal called "MyChart".  Sign up information is provided on this After  Visit Summary.  MyChart is used to connect with patients for Virtual Visits (Telemedicine).  Patients are able to view lab/test results, encounter notes, upcoming appointments, etc.  Non-urgent messages can be sent to your provider as well.   To learn more about what you can do with MyChart, go to [email or web address].    Your next appointment:   June 25, 2021 @ 8:20 am  The format for your next appointment:   In Person  Provider:   Buford Dresser, MD{      Wilhemina Bonito as a scribe for Buford Dresser, MD.,have documented all relevant documentation on the behalf of Buford Dresser, MD,as directed by  Buford Dresser, MD while in the presence of Buford Dresser, MD.  I, Buford Dresser, MD, have reviewed all documentation for this visit. The documentation on 03/30/21 for the exam, diagnosis, procedures, and orders are all accurate and complete.   Signed, Buford Dresser, MD PhD 03/30/2021 7:12 PM    [facility]

## 2021-04-07 ENCOUNTER — Other Ambulatory Visit (HOSPITAL_COMMUNITY): Payer: Self-pay | Admitting: Gastroenterology

## 2021-04-07 ENCOUNTER — Other Ambulatory Visit: Payer: Self-pay | Admitting: Gastroenterology

## 2021-04-07 DIAGNOSIS — R7989 Other specified abnormal findings of blood chemistry: Secondary | ICD-10-CM

## 2021-04-10 ENCOUNTER — Other Ambulatory Visit: Payer: Self-pay | Admitting: Gastroenterology

## 2021-04-10 DIAGNOSIS — K745 Biliary cirrhosis, unspecified: Secondary | ICD-10-CM

## 2021-04-13 ENCOUNTER — Encounter (HOSPITAL_COMMUNITY): Payer: Self-pay

## 2021-04-13 ENCOUNTER — Ambulatory Visit (HOSPITAL_COMMUNITY): Payer: Medicare Other

## 2021-04-15 ENCOUNTER — Ambulatory Visit
Admission: RE | Admit: 2021-04-15 | Discharge: 2021-04-15 | Disposition: A | Discharge status: Home / Self Care | Payer: Medicare Other | Source: Ambulatory Visit | Attending: Gastroenterology | Admitting: Gastroenterology

## 2021-04-15 DIAGNOSIS — K745 Biliary cirrhosis, unspecified: Secondary | ICD-10-CM

## 2021-04-16 ENCOUNTER — Encounter (HOSPITAL_COMMUNITY): Payer: Self-pay | Admitting: Gastroenterology

## 2021-04-17 ENCOUNTER — Ambulatory Visit (HOSPITAL_COMMUNITY)
Admission: RE | Admit: 2021-04-17 | Discharge: 2021-04-17 | Disposition: A | Discharge status: Home / Self Care | Payer: Medicare Other | Attending: Gastroenterology | Admitting: Gastroenterology

## 2021-04-17 ENCOUNTER — Ambulatory Visit (HOSPITAL_COMMUNITY): Payer: Medicare Other | Admitting: Anesthesiology

## 2021-04-17 ENCOUNTER — Ambulatory Visit (HOSPITAL_BASED_OUTPATIENT_CLINIC_OR_DEPARTMENT_OTHER): Payer: Medicare Other | Admitting: Anesthesiology

## 2021-04-17 ENCOUNTER — Encounter (HOSPITAL_COMMUNITY): Payer: Self-pay | Admitting: Gastroenterology

## 2021-04-17 ENCOUNTER — Encounter (HOSPITAL_COMMUNITY)
Admission: RE | Disposition: A | Discharge status: Home / Self Care | Payer: Self-pay | Source: Home / Self Care | Attending: Gastroenterology

## 2021-04-17 ENCOUNTER — Other Ambulatory Visit (HOSPITAL_COMMUNITY): Payer: Self-pay | Admitting: Gastroenterology

## 2021-04-17 ENCOUNTER — Other Ambulatory Visit: Payer: Self-pay | Admitting: Gastroenterology

## 2021-04-17 DIAGNOSIS — K766 Portal hypertension: Secondary | ICD-10-CM

## 2021-04-17 DIAGNOSIS — I110 Hypertensive heart disease with heart failure: Secondary | ICD-10-CM | POA: Diagnosis not present

## 2021-04-17 DIAGNOSIS — D649 Anemia, unspecified: Secondary | ICD-10-CM | POA: Diagnosis not present

## 2021-04-17 DIAGNOSIS — K739 Chronic hepatitis, unspecified: Secondary | ICD-10-CM | POA: Insufficient documentation

## 2021-04-17 DIAGNOSIS — I509 Heart failure, unspecified: Secondary | ICD-10-CM | POA: Diagnosis not present

## 2021-04-17 DIAGNOSIS — K3189 Other diseases of stomach and duodenum: Secondary | ICD-10-CM

## 2021-04-17 DIAGNOSIS — K7460 Unspecified cirrhosis of liver: Secondary | ICD-10-CM

## 2021-04-17 DIAGNOSIS — Z86010 Personal history of colonic polyps: Secondary | ICD-10-CM | POA: Diagnosis not present

## 2021-04-17 DIAGNOSIS — Q446 Cystic disease of liver: Secondary | ICD-10-CM

## 2021-04-17 DIAGNOSIS — I8510 Secondary esophageal varices without bleeding: Secondary | ICD-10-CM

## 2021-04-17 DIAGNOSIS — Z13810 Encounter for screening for upper gastrointestinal disorder: Secondary | ICD-10-CM | POA: Diagnosis not present

## 2021-04-17 DIAGNOSIS — E119 Type 2 diabetes mellitus without complications: Secondary | ICD-10-CM | POA: Diagnosis not present

## 2021-04-17 DIAGNOSIS — D759 Disease of blood and blood-forming organs, unspecified: Secondary | ICD-10-CM | POA: Insufficient documentation

## 2021-04-17 HISTORY — PX: ESOPHAGOGASTRODUODENOSCOPY (EGD) WITH PROPOFOL: SHX5813

## 2021-04-17 LAB — GLUCOSE, CAPILLARY: Glucose-Capillary: 102 mg/dL — ABNORMAL HIGH (ref 70–99)

## 2021-04-17 SURGERY — ESOPHAGOGASTRODUODENOSCOPY (EGD) WITH PROPOFOL
Anesthesia: Monitor Anesthesia Care

## 2021-04-17 MED ORDER — LIDOCAINE HCL (CARDIAC) PF 100 MG/5ML IV SOSY
PREFILLED_SYRINGE | INTRAVENOUS | Status: DC | PRN
  Administered 2021-04-17: 100 mg via INTRAVENOUS

## 2021-04-17 MED ORDER — PROPOFOL 10 MG/ML IV BOLUS
INTRAVENOUS | Status: DC | PRN
  Administered 2021-04-17: 20 mg via INTRAVENOUS
  Administered 2021-04-17: 50 mg via INTRAVENOUS
  Administered 2021-04-17: 20 mg via INTRAVENOUS
  Administered 2021-04-17: 50 mg via INTRAVENOUS
  Administered 2021-04-17 (×2): 20 mg via INTRAVENOUS

## 2021-04-17 MED ORDER — LACTATED RINGERS IV SOLN: INTRAVENOUS | Status: DC | PRN

## 2021-04-17 SURGICAL SUPPLY — 15 items

## 2021-04-17 NOTE — H&P (Signed)
[name] ?HPI: This 71 year old white female presents to the office for evaluation of elevated liver functions. She has had elevated liver functions for at least 20 years. She had a liver biopsy done in 2006 which revealed chronic, active hepatitis with marked steatosis and increased fibrosis.  Her last lab work done on 03/25/2021 revealed an AST of 47 and an ALT of 39. A CBC done at the same time revealed platelet count of 80. She does not drink alcohol. She has lost over 10 pounds in the last 3 years.  She has 1 BM every 2 days with no obvious blood or mucus in the stool. She has a good appetite.  She denies having any complaints of abdominal pain, nausea, vomiting, acid reflux, dysphagia or odynophagia. She denies having a family history of colon cancer, celiac sprue or IBD. Her last colonoscopy was done on 01/23/2018 when sessile serrated adenomas and tubular adenomas were removed from the cecum and ascending colon. ? ?Past Medical History:  ?Diagnosis Date  ? CAD (coronary artery disease)   ? Complication of anesthesia   ? trouble waking up once  ? Diabetes mellitus without complication (HCC)   ? Hypertension   ? Hypothyroidism   ? Liver disease   ? 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 fatty liver  ? Sarcoidosis   ? Sarcoidosis of lung (HCC)   ? Sarcoidosis of lymph nodes   ? ? ?Past Surgical History:  ?Procedure Laterality Date  ? ABDOMINAL HYSTERECTOMY    ? ABDOMINAL SURGERY    ? CHOLECYSTECTOMY    ? HERNIA REPAIR    ? LEFT HEART CATH AND CORONARY ANGIOGRAPHY N/A 09/23/2020  ? Procedure: LEFT HEART CATH AND CORONARY ANGIOGRAPHY;  Surgeon: Lennette Bihari, MD;  Location: [facility] INVASIVE [facility];  Service: Cardiovascular;  Laterality: N/A;  ? nissenfundiplication    ? ? ?Family History  ?Problem Relation Age of Onset  ? Heart failure Father   ? Hypertension Father   ? Heart attack Father   ? Heart attack Mother   ? Breast cancer Mother 64  ? Heart attack Brother   ? Heart attack Brother   ? ? ?Social History:  reports that she  has never smoked. She has never used smokeless tobacco. She reports that she does not drink alcohol and does not use drugs. ? ?Allergies:  ?Allergies  ?Allergen Reactions  ? Sulfa Antibiotics Other (See Comments)  ?  High fever ?Body ache  ? Atorvastatin Other (See Comments)  ?  Extreme muscle cramps and muscle aches  ? Amoxicillin Rash  ? ? ?Medications: Scheduled: ?Continuous: ? ?Results for orders placed or performed during the hospital encounter of 04/17/21 (from the past 24 hour(s))  ?Glucose, capillary     Status: Abnormal  ? Collection Time: 04/17/21  7:23 AM  ?Result Value Ref Range  ? Glucose-Capillary 102 (H) 70 - 99 mg/dL  ?  ? ?US Abdomen Limited RUQ (LIVER/GB) ? ?Result Date: 04/16/2021 ?CLINICAL DATA:  Hepatic cirrhosis. EXAM: ULTRASOUND ABDOMEN LIMITED RIGHT UPPER QUADRANT COMPARISON:  None. FINDINGS: Gallbladder: Status post cholecystectomy. Common bile duct: Diameter: 8 mm which is within normal limits for post cholecystectomy status. Liver: Heterogeneous echotexture of hepatic parenchyma is noted with slightly nodular hepatic contours consistent with hepatic cirrhosis. 8 mm rounded hypoechoic area is noted most consistent with cyst in right hepatic lobe. 1 cm hypoechoic area is seen peripherally in right hepatic lobe also most consistent with cyst. However, multiple other smaller hypoechoic areas are  noted of uncertain etiology. Portal vein is patent on color Doppler imaging with normal direction of blood flow towards the liver. Other: None. IMPRESSION: Status post cholecystectomy. Findings consistent with hepatic cirrhosis. At least 2 rounded hypoechoic areas are noted in the right hepatic lobe most consistent with cysts. However, there are multiple other hypoechoic areas which do not appear to clearly represent cysts. MRI with and without gadolinium is recommended to rule out other pathology. Electronically Signed   By: Lupita Raider M.D.   On: 04/16/2021 13:39   ? ?ROS:  As stated above in the  HPI otherwise negative. ? ?Blood pressure (!) 145/35, pulse 67, temperature (!) 97.3 ?F (36.3 ?C), temperature source Temporal, resp. rate 20, height 5\' 2"  (1.575 m), weight 76.7 kg, SpO2 96 %.   ? ?PE: ?Gen: NAD, Alert and Oriented ?HEENT:  [place]/AT, EOMI ?Neck: Supple, no LAD ?Lungs: CTA Bilaterally ?CV: RRR without M/G/R ?ABD: Soft, NTND, +BS ?Ext: No C/C/E ? ?Assessment/Plan: ?1) Cirrhosis - EGD to screen for varices. ? ?Krishang Reading D ?04/17/2021, 7:41 AM  ? ? ?  ? ?

## 2021-04-17 NOTE — Transfer of Care (Signed)
Immediate Anesthesia Transfer of Care Note ? ?Patient: [name] ? ?Procedure(s) Performed: ESOPHAGOGASTRODUODENOSCOPY (EGD) WITH PROPOFOL ?ESOPHAGEAL BANDING ? ?Patient Location: PACU and Endoscopy Unit ? ?Anesthesia Type:MAC ? ?Level of Consciousness: drowsy ? ?Airway & Oxygen Therapy: Patient Spontanous Breathing ? ?Post-op Assessment: Report given to RN and Post -op Vital signs reviewed and stable ? ?Post vital signs: Reviewed and stable ? ?Last Vitals:  ?Vitals Value Taken Time  ?BP 117/34 04/17/21 0810  ?Temp    ?Pulse 64 04/17/21 0810  ?Resp 17 04/17/21 0810  ?SpO2 92 % 04/17/21 0810  ? ? ?Last Pain:  ?Vitals:  ? 04/17/21 0716  ?TempSrc: Temporal  ?PainSc: 4   ?   ? ?  ? ?Complications: No notable events documented. ?

## 2021-04-17 NOTE — Op Note (Signed)
[facility] ?Patient Name: [name] ?Procedure Date: 04/17/2021 ?MRN: [id number] ?Attending MD: Carol Ada , MD ?Date of Birth: 21-Sep-1950 ?CSN: [id number] ?Age: 71 ?Admit Type: Inpatient ?Procedure:                Upper GI endoscopy ?Indications:              Cirrhosis with suspected esophageal varices ?Providers:                Carol Ada, MD, Cathlean Marseilles, Justina  ?                          Purcell Nails, Merchant navy officer, Stephanie [place] CRNA ?Referring MD:              ?Medicines:                Propofol per Anesthesia ?Complications:            No immediate complications. ?Estimated Blood Loss:     Estimated blood loss: none. ?Procedure:                Pre-Anesthesia Assessment: ?                          - Prior to the procedure, a History and Physical  ?                          was performed, and patient medications and  ?                          allergies were reviewed. The patient's tolerance of  ?                          previous anesthesia was also reviewed. The risks  ?                          and benefits of the procedure and the sedation  ?                          options and risks were discussed with the patient.  ?                          All questions were answered, and informed consent  ?                          was obtained. Prior Anticoagulants: The patient has  ?                          taken no previous anticoagulant or antiplatelet  ?                          agents. ASA Grade Assessment: III - A patient with  ?                          severe systemic disease. After reviewing the risks  ?  and benefits, the patient was deemed in  ?                          satisfactory condition to undergo the procedure. ?                          - Sedation was administered by an anesthesia  ?                          professional. Deep sedation was attained. ?                          After obtaining informed consent, the endoscope was  ?                           passed under direct vision. Throughout the  ?                          procedure, the patient's blood pressure, pulse, and  ?                          oxygen saturations were monitored continuously. The  ?                          [id number]) Olympus endoscope was introduced  ?                          through the mouth, and advanced to the second part  ?                          of duodenum. The upper GI endoscopy was  ?                          accomplished without difficulty. The patient  ?                          tolerated the procedure well. ?Scope In: ?Scope Out: ?Findings: ?     Small (< 5 mm) varices were found in the lower third of the esophagus.  ?     They were small in size. ?     Mild portal hypertensive gastropathy was found in the gastric fundus and  ?     in the gastric body. ?     The examined duodenum was normal. ?Impression:               - Small (< 5 mm) esophageal varices. ?                          - Portal hypertensive gastropathy. ?                          - Normal examined duodenum. ?                          - No specimens collected. ?Moderate Sedation: ?     Not Applicable - Patient had  care per Anesthesia. ?Recommendation:           - Patient has a contact number available for  ?                          emergencies. The signs and symptoms of potential  ?                          delayed complications were discussed with the  ?                          patient. Return to normal activities tomorrow.  ?                          Written discharge instructions were provided to the  ?                          patient. ?                          - Resume previous diet. ?                          - Continue present medications. ?                          - Repeat upper endoscopy in 2 years for  ?                          surveillance. ?Procedure Code(s):        --- Professional --- ?                          [phone], Esophagogastroduodenoscopy, flexible,  ?                           transoral; diagnostic, including collection of  ?                          specimen(s) by brushing or washing, when performed  ?                          (separate procedure) ?Diagnosis Code(s):        --- Professional --- ?                          K74.60, Unspecified cirrhosis of liver ?                          I85.10, Secondary esophageal varices without  ?                          bleeding ?                          K76.6, Portal hypertension ?  K31.89, Other diseases of stomach and duodenum ?CPT copyright 2019 [employer] Medical Association. All rights reserved. ?The codes documented in this report are preliminary and upon coder review may  ?be revised to meet current compliance requirements. ?Carol Ada, MD ?Carol Ada, MD ?04/17/2021 8:15:39 AM ?This report has been signed electronically. ?Number of Addenda: 0 ?

## 2021-04-17 NOTE — Anesthesia Preprocedure Evaluation (Signed)
Anesthesia Evaluation  ?Patient identified by MRN, date of birth, ID band ?Patient awake ? ? ? ?Reviewed: ?Allergy & Precautions, NPO status  ? ?History of Anesthesia Complications ?(+) PROLONGED EMERGENCE and history of anesthetic complications ? ?Airway ?Mallampati: III ? ?TM Distance: >3 FB ?Neck ROM: Full ? ? ? Dental ? ?(+) Teeth Intact, Dental Advisory Given ?  ?Pulmonary ?shortness of breath, asthma , neg sleep apnea, neg recent URI,  ?  ?breath sounds clear to auscultation ? ? ? ? ? ? Cardiovascular ?hypertension, Pt. on medications and Pt. on home beta blockers ?(-) angina+ CAD and +CHF  ?(-) Past MI  ?Rhythm:Regular  ?2020 stress: ? ?? Nuclear stress EF: 63%. ?? The study is normal. ?? This is a low risk study. ?? The left ventricular ejection fraction is normal (55-65%). ?  ?Normal stress nuclear study with no ischemia or infarction; EF 63 with normal wall motion.  ? ? ?9/22 cath: ? ??  Prox Cx lesion is 20% stenosed. ?? ?Mild nonobstructive CAD with tortuous vessels and smooth 20% narrowing in the proximal circumflex far artery.  The LAD appears angiographically normal.  The right coronary artery is a dominant vessel with superior takeoff. ?? ?Mild LV to aorta gradient on pullback with a peak to peak gradient at approximately 16 mmHg. ?? ?RECOMMENDATION: ?Medical therapy. ?? ? ? ??1. Left ventricular ejection fraction, by estimation, is 65 to 70%. The  ?left ventricle has normal function. The left ventricle has no regional  ?wall motion abnormalities. Left ventricular diastolic parameters are  ?consistent with Grade II diastolic  ?dysfunction (pseudonormalization).  ??2. Right ventricular systolic function is normal. The right ventricular  ?size is normal.  ??3. Left atrial size was mildly dilated.  ??4. There is systolic anterior motion of the MV leaflet. . The mitral  ?valve is normal in structure. Moderate mitral valve regurgitation.  ??5. The aortic valve is normal  in structure. Aortic valve regurgitation is  ?not visualized.  ?  ?Neuro/Psych ?negative neurological ROS ? negative psych ROS  ? GI/Hepatic ?negative GI ROS, (+) Cirrhosis  ?  ?  ? , Lab Results ?     Component                Value               Date                 ?     ALT                      32                  02/12/2021           ?     AST                      43 (H)              02/12/2021           ?     ALKPHOS                  95                  02/12/2021           ?     BILITOT  0.8                 02/12/2021           ? ?  ?Endo/Other  ?diabetesHypothyroidism Lab Results ?     Component                Value               Date                 ?     HGBA1C                   5.8 (H)             09/23/2020           ? ? Renal/GU ?negative Renal ROSLab Results ?     Component                Value               Date                 ?     CREATININE               0.69                03/04/2021           ?Lab Results ?     Component                Value               Date                 ?     K                        3.8                 03/04/2021           ?  ? ?  ?Musculoskeletal ? ? Abdominal ?  ?Peds ? Hematology ? ?(+) Blood dyscrasia, anemia , Lab Results ?     Component                Value               Date                 ?     WBC                      4.2                 02/12/2021           ?     HGB                      11.8 (L)            02/12/2021           ?     HCT                      36.0                02/12/2021           ?     MCV  84.5                02/12/2021           ?     PLT                      78.0 (L)            02/12/2021           ?   ?Anesthesia Other Findings ? ? Reproductive/Obstetrics ? ?  ? ? ? ? ? ? ? ? ? ? ? ? ? ?  ?  ? ? ? ? ? ? ? ? ?Anesthesia Physical ?Anesthesia Plan ? ?ASA: 2 ? ?Anesthesia Plan: MAC  ? ?Post-op Pain Management: Minimal or no pain anticipated  ? ?Induction:  ? ?PONV Risk Score and Plan: 2 and Propofol infusion  and Treatment may vary due to age or medical condition ? ?Airway Management Planned: Nasal Cannula, Natural Airway and Simple Face Mask ? ?Additional Equipment: None ? ?Intra-op Plan:  ? ?Post-operative Plan:  ? ?Informed Consent: I have reviewed the patients History and Physical, chart, labs and discussed the procedure including the risks, benefits and alternatives for the proposed anesthesia with the patient or authorized representative who has indicated his/her understanding and acceptance.  ? ? ? ?Dental advisory given ? ?Plan Discussed with: CRNA and Anesthesiologist ? ?Anesthesia Plan Comments:   ? ? ? ? ? ? ?Anesthesia Quick Evaluation ? ?

## 2021-04-17 NOTE — Discharge Instructions (Signed)
YOU HAD AN ENDOSCOPIC PROCEDURE TODAY: Refer to the procedure report and other information in the discharge instructions given to you for any specific questions about what was found during the examination. If this information does not answer your questions, please call [facility] at [phone] to clarify.  ? ?YOU SHOULD EXPECT: Some feelings of bloating in the abdomen. Passage of more gas than usual. Walking can help get rid of the air that was put into your GI tract during the procedure and reduce the bloating. ? ?DIET: Your first meal following the procedure should be a light meal and then it is ok to progress to your normal diet. A half-sandwich or bowl of soup is an example of a good first meal. Heavy or fried foods are harder to digest and may make you feel nauseous or bloated. Drink plenty of fluids but you should avoid alcoholic beverages for 24 hours.  ? ?ACTIVITY: Your care partner should take you home directly after the procedure. You should plan to take it easy, moving slowly for the rest of the day. You can resume normal activity the day after the procedure however YOU SHOULD NOT DRIVE, use power tools, machinery or perform tasks that involve climbing or major physical exertion for 24 hours (because of the sedation medicines used during the test).  ? ?SYMPTOMS TO REPORT IMMEDIATELY: ?A gastroenterologist can be reached at any hour. Please call [phone]  for any of the following symptoms:  ? ?Following upper endoscopy (EGD, EUS, ERCP, esophageal dilation) ?Vomiting of blood or coffee ground material  ?New, significant abdominal pain  ?New, significant chest pain or pain under the shoulder blades  ?Painful or persistently difficult swallowing  ?New shortness of breath  ?Black, tarry-looking or red, bloody stools ? ?FOLLOW UP:  ?If any biopsies were taken you will be contacted by phone or by letter within the next 1-3 weeks. Call [phone]  if you have not heard about the biopsies in 3  weeks.  ?Please also call with any specific questions about appointments or follow up tests.  ?

## 2021-04-19 NOTE — Anesthesia Postprocedure Evaluation (Signed)
Anesthesia Post Note ? ?Patient: [name] ? ?Procedure(s) Performed: ESOPHAGOGASTRODUODENOSCOPY (EGD) WITH PROPOFOL ?ESOPHAGEAL BANDING ? ?  ? ?Patient location during evaluation: PACU ?Anesthesia Type: MAC ?Level of consciousness: awake and alert ?Pain management: pain level controlled ?Vital Signs Assessment: post-procedure vital signs reviewed and stable ?Respiratory status: spontaneous breathing, nonlabored ventilation and respiratory function stable ?Cardiovascular status: stable and blood pressure returned to baseline ?Postop Assessment: no apparent nausea or vomiting ?Anesthetic complications: no ? ? ?No notable events documented. ? ?Last Vitals:  ?Vitals:  ? 04/17/21 0830 04/17/21 0840  ?BP: (!) 126/52 (!) 122/40  ?Pulse: 63 63  ?Resp: 19 15  ?Temp:    ?SpO2: 94% 92%  ?  ?Last Pain:  ?Vitals:  ? 04/17/21 0840  ?TempSrc:   ?PainSc: 0-No pain  ? ? ?  ?  ?  ?  ?  ?  ? ?[name] ? ? ? ? ?

## 2021-04-20 ENCOUNTER — Encounter (HOSPITAL_COMMUNITY): Payer: Self-pay | Admitting: Gastroenterology

## 2021-04-29 ENCOUNTER — Emergency Department (HOSPITAL_COMMUNITY)
Admission: EM | Admit: 2021-04-29 | Discharge: 2021-04-30 | Disposition: A | Discharge status: Home / Self Care | Payer: Medicare Other | Attending: Emergency Medicine | Admitting: Emergency Medicine

## 2021-04-29 ENCOUNTER — Emergency Department (HOSPITAL_COMMUNITY): Payer: Medicare Other

## 2021-04-29 ENCOUNTER — Other Ambulatory Visit: Payer: Self-pay

## 2021-04-29 ENCOUNTER — Encounter (HOSPITAL_COMMUNITY): Payer: Self-pay

## 2021-04-29 ENCOUNTER — Emergency Department (HOSPITAL_COMMUNITY)
Admission: RE | Admit: 2021-04-29 | Discharge: 2021-04-29 | Disposition: A | Discharge status: Home / Self Care | Payer: Medicare Other | Source: Ambulatory Visit | Attending: Gastroenterology | Admitting: Gastroenterology

## 2021-04-29 DIAGNOSIS — Q446 Cystic disease of liver: Secondary | ICD-10-CM | POA: Insufficient documentation

## 2021-04-29 DIAGNOSIS — Z7982 Long term (current) use of aspirin: Secondary | ICD-10-CM | POA: Diagnosis not present

## 2021-04-29 DIAGNOSIS — R0789 Other chest pain: Secondary | ICD-10-CM | POA: Diagnosis not present

## 2021-04-29 DIAGNOSIS — R0781 Pleurodynia: Secondary | ICD-10-CM | POA: Diagnosis present

## 2021-04-29 LAB — BASIC METABOLIC PANEL
Anion gap: 7 (ref 5–15)
BUN: 16 mg/dL (ref 8–23)
CO2: 20 mmol/L — ABNORMAL LOW (ref 22–32)
Calcium: 9.2 mg/dL (ref 8.9–10.3)
Chloride: 107 mmol/L (ref 98–111)
Creatinine, Ser: 0.76 mg/dL (ref 0.44–1.00)
GFR, Estimated: >60 mL/min (ref >60)
Glucose, Bld: 111 mg/dL — ABNORMAL HIGH (ref 70–99)
Potassium: 3.9 mmol/L (ref 3.5–5.1)
Sodium: 134 mmol/L — ABNORMAL LOW (ref 135–145)

## 2021-04-29 LAB — CBC
HCT: 37.6 % (ref 36.0–46.0)
Hemoglobin: 12 g/dL (ref 12.0–15.0)
MCH: 27.5 pg (ref 26.0–34.0)
MCHC: 31.9 g/dL (ref 30.0–36.0)
MCV: 86.2 fL (ref 80.0–100.0)
Platelets: 100 10*3/uL — ABNORMAL LOW (ref 150–400)
RBC: 4.36 MIL/uL (ref 3.87–5.11)
RDW: 14.1 % (ref 11.5–15.5)
WBC: 4 10*3/uL (ref 4.0–10.5)
nRBC: 0 % (ref 0.0–0.2)

## 2021-04-29 LAB — TROPONIN I (HIGH SENSITIVITY): Troponin I (High Sensitivity): 9 ng/L (ref <18)

## 2021-04-29 MED ORDER — GADOBUTROL 1 MMOL/ML IV SOLN
7.0000 mL | Freq: Once | INTRAVENOUS | Status: AC | PRN
  Administered 2021-04-29: 7 mL via INTRAVENOUS

## 2021-04-29 NOTE — ED Provider Triage Note (Signed)
Emergency Medicine Provider Triage Evaluation Note ? ?[name] , a 71 y.o. female  was evaluated in triage.  Pt complains of sudden onset, constant, sharp, left rib pain that began earlier today.  Patient reports that she needs assistance getting out of the bed she needs a knee replacement.  When she was rolling in the bed with her husband helping her she felt a pop in her left ribs with severe pain.  She reports that she has improvement when she puts pressure on her ribs with a pillow. ? ?Review of Systems  ?Positive: + L rib pain, SOB ?Negative:  ? ?Physical Exam  ?BP (!) 172/63 (BP Location: Right Arm)   Pulse 73   Temp 98.3 ?F (36.8 ?C) (Oral)   Resp 17   SpO2 94%  ?Gen:   Awake, no distress   ?Resp:  Normal effort  ?MSK:   Moves extremities without difficulty  ?Other:  + TTP To lateral aspect of L ribs ? ?Medical Decision Making  ?Medically screening exam initiated at 10:53 PM.  Appropriate orders placed.  [name] was informed that the remainder of the evaluation will be completed by another provider, this initial triage assessment does not replace that evaluation, and the importance of remaining in the ED until their evaluation is complete. ? ? ?  ?Tanda Rockers, PA-C ?04/29/21 2255 ? ?

## 2021-04-29 NOTE — ED Triage Notes (Signed)
Pt reports having knee issues and states that when she got into bed she felt a pop in her left upper rib area.  ?

## 2021-04-30 MED ORDER — LIDOCAINE 5 % EX PTCH: 1.0000 | MEDICATED_PATCH | CUTANEOUS | 0 refills | Status: DC

## 2021-04-30 MED ORDER — CYCLOBENZAPRINE HCL 5 MG PO TABS: 5.0000 mg | ORAL_TABLET | Freq: Two times a day (BID) | ORAL | 0 refills | Status: DC | PRN

## 2021-04-30 MED ORDER — LIDOCAINE 5 % EX PTCH
1.0000 | MEDICATED_PATCH | CUTANEOUS | Status: DC
  Administered 2021-04-30: 1 via TRANSDERMAL
  Filled 2021-04-30: qty 1

## 2021-04-30 MED ORDER — CYCLOBENZAPRINE HCL 10 MG PO TABS
5.0000 mg | ORAL_TABLET | Freq: Once | ORAL | Status: AC
  Administered 2021-04-30: 5 mg via ORAL
  Filled 2021-04-30: qty 1

## 2021-04-30 NOTE — ED Provider Notes (Signed)
?[facility] DEPT ?Provider Note ? ? ?CSN: [id number] ?Arrival date & time: 04/29/21  2105 ? ?  ? ?History ? ?Chief Complaint  ?Patient presents with  ? Chest Pain  ? ? ?[name] is a 71 y.o. female. ? ?The history is provided by the patient, the spouse and medical records.  ?Chest Pain ?[name] is a 71 y.o. female who presents to the Emergency Department complaining of rib pain.  She presents to the ED for evaluation of left rib pain that started acutely at 3pm.  She was getting into bed and she felt a pop in her left axillary rib area just beneath the breast.  Pain is worse with movement, deep breaths.  Keeping a pillow against her chest helps the pain.   ? ?She cannot take nsaids per her cardiologist and cannot take acetaminophen due to cirrhosis.   ? ?Had a temp of 99 on Tuesday.  No sob.  No abdominal pain.  Has nausea but feels like it is due to pain.  Has chronic lower extremity edema - unchanged from baseline.   ? ?  ? ?Home Medications ?Prior to Admission medications   ?Medication Sig Start Date End Date Taking? Authorizing Provider  ?cyclobenzaprine (FLEXERIL) 5 MG tablet Take 1 tablet (5 mg total) by mouth 2 (two) times daily as needed for muscle spasms. 04/30/21  Yes Quintella Reichert, MD  ?lidocaine (LIDODERM) 5 % Place 1 patch onto the skin daily. Remove & Discard patch within 12 hours or as directed by MD 04/30/21  Yes Quintella Reichert, MD  ?acyclovir (ZOVIRAX) 400 MG tablet Take 400 mg by mouth every 8 (eight) hours as needed. Fever blisters 05/19/20   [provider]  ?albuterol (VENTOLIN HFA) 108 (90 Base) MCG/ACT inhaler Inhale 2 puffs into the lungs every 6 (six) hours as needed. 12/09/20   Spero Geralds, MD  ?aspirin 81 MG chewable tablet Chew and swallow 1 tablet (81 mg total) by mouth daily. 09/26/20   Charlynne Cousins, MD  ?calcium-vitamin D (OSCAL WITH D) 500-200 MG-UNIT tablet Take 1 tablet by mouth daily with breakfast.    [provider]   ?carvedilol (COREG) 25 MG tablet Take 1 tablet (25 mg total) by mouth 2 (two) times daily with a meal. 03/30/21   Buford Dresser, MD  ?cetirizine (ZYRTEC) 10 MG tablet Take 10 mg by mouth daily.    [provider]  ?furosemide (LASIX) 20 MG tablet Take 20-40 mg by mouth See admin instructions. Taking 20 mg every Sun., Tues., Thurs., and Sat. Taking 40 mg all other days.    [provider]  ?gabapentin (NEURONTIN) 300 MG capsule Take 300 mg by mouth 3 (three) times daily. 09/02/20   [provider]  ?hydrALAZINE (APRESOLINE) 25 MG tablet Take 1 tablet (25 mg total) by mouth 3 (three) times daily. 02/18/21 05/19/21  Loel Dubonnet, NP  ?irbesartan (AVAPRO) 150 MG tablet Take 1 tablet (150 mg total) by mouth daily. 02/18/21   Loel Dubonnet, NP  ?levothyroxine (SYNTHROID, LEVOTHROID) 150 MCG tablet Take 75-150 mcg by mouth See admin instructions. Pt alternates between 150 mcg and 75 mcg each day with before breakfast    [provider]  ?LORazepam (ATIVAN) 1 MG tablet Take 1 mg by mouth 3 (three) times daily as needed for anxiety. 07/15/20   [provider]  ?metFORMIN (GLUCOPHAGE-XR) 500 MG 24 hr tablet Take 2 tablets (1,000 mg total) by mouth 2 (two) times daily. ?  Patient taking differently: Take 1,000 mg by mouth daily with breakfast. 09/25/20   Charlynne Cousins, MD  ?oxyCODONE-acetaminophen (PERCOCET) 5-325 MG tablet Take 1-2 tablets by mouth every 6 (six) hours as needed. 06/10/20   Veryl Speak, MD  ?rosuvastatin (CRESTOR) 10 MG tablet Take 10 mg by mouth daily.    [provider]  ?zolpidem (AMBIEN) 10 MG tablet Take 10 mg by mouth at bedtime.    [provider]  ?   ? ?Allergies    ?Sulfa antibiotics, Atorvastatin, and Amoxicillin   ? ?Review of Systems   ?Review of Systems  ?Cardiovascular:  Positive for chest pain.  ? ?Physical Exam ?Updated Vital Signs ?BP (!) 172/63 (BP Location: Right Arm)   Pulse 73   Temp 98.3 ?F (36.8 ?C) (Oral)    Resp 17   SpO2 94%  ?Physical Exam ?Vitals and nursing note reviewed.  ?Constitutional:   ?   Appearance: She is well-developed.  ?HENT:  ?   Head: Normocephalic and atraumatic.  ?Cardiovascular:  ?   Rate and Rhythm: Normal rate and regular rhythm.  ?   Heart sounds: No murmur heard. ?Pulmonary:  ?   Effort: Pulmonary effort is normal. No respiratory distress.  ?   Breath sounds: Normal breath sounds.  ?   Comments: TTP over left axillary chest wall without overlying lesions ?Chest:  ?   Chest wall: Tenderness present.  ?Abdominal:  ?   Palpations: Abdomen is soft.  ?   Tenderness: There is no abdominal tenderness. There is no guarding or rebound.  ?Musculoskeletal:     ?   General: No tenderness.  ?   Comments: 2+ DP pulses bilaterally, 1+ nonpitting edema to BLE  ?Skin: ?   General: Skin is warm and dry.  ?Neurological:  ?   Mental Status: She is alert and oriented to person, place, and time.  ?Psychiatric:     ?   Behavior: Behavior normal.  ? ? ?ED Results / Procedures / Treatments   ?Labs ?(all labs ordered are listed, but only abnormal results are displayed) ?Labs Reviewed  ?BASIC METABOLIC PANEL - Abnormal; Notable for the following components:  ?    Result Value  ? Sodium 134 (*)   ? CO2 20 (*)   ? Glucose, Bld 111 (*)   ? All other components within normal limits  ?CBC - Abnormal; Notable for the following components:  ? Platelets 100 (*)   ? All other components within normal limits  ?TROPONIN I (HIGH SENSITIVITY)  ?TROPONIN I (HIGH SENSITIVITY)  ? ? ?EKG ?EKG Interpretation ? ?Date/Time:  Wednesday April 29 2021 22:45:14 EDT ?Ventricular Rate:  76 ?PR Interval:  177 ?QRS Duration: 101 ?QT Interval:  390 ?QTC Calculation: 439 ?R Axis:   53 ?Text Interpretation: Sinus rhythm Borderline T wave abnormalities Artifact in lead(s) I II aVR aVL Confirmed by Quintella Reichert [phone]) on 04/29/2021 11:42:53 PM ? ?Radiology ?DG Ribs Unilateral W/Chest Left ? ?Result Date: 04/29/2021 ?CLINICAL DATA:  Rib pain. Sudden  onset of left-sided rib pain today. Felt pop. EXAM: LEFT RIBS AND CHEST - 3+ VIEW COMPARISON:  Chest radiograph 09/22/2020, CT 03/06/2020 FINDINGS: A BB was placed at site of pain involving the left ribs. Left ribs in the area of the BB are not well assessed due to overlying soft tissue attenuation. Allowing for this, no evidence of rib fracture or focal lesion are seen. There is no evidence of pneumothorax or pleural effusion. There are streaky opacities at the  left lung base, favoring atelectasis. Heart size and mediastinal contours are within normal limits. IMPRESSION: 1. No acute rib fracture or focal lesion is seen. As clinically indicated, consider chest CT as the area of clinical concern is partially obscured by overlapping soft tissue attenuation. 2. Streaky opacities at the left lung base favoring atelectasis. Electronically Signed   By: Keith Rake M.D.   On: 04/29/2021 23:52  ? ?MR ABDOMEN WWO CONTRAST ? ?Result Date: 04/29/2021 ?CLINICAL DATA:  Hepatic cirrhosis, abnormal ultrasound EXAM: MRI ABDOMEN WITHOUT AND WITH CONTRAST TECHNIQUE: Multiplanar multisequence MR imaging of the abdomen was performed both before and after the administration of intravenous contrast. CONTRAST:  43mL GADAVIST GADOBUTROL 1 MMOL/ML IV SOLN COMPARISON:  Abdominal ultrasound 04/15/2021 FINDINGS: Lower chest: Trace bilateral pleural effusions. Hepatobiliary: Liver is nodular with inhomogeneous parenchyma consistent with cirrhosis. No suspicious hepatic mass visualized. Gallbladder is surgically absent. No significant biliary ductal dilatation appreciated. Pancreas: No mass, inflammatory changes, or other parenchymal abnormality identified. Spleen:  Enlarged measuring 14 cm in length. Adrenals/Urinary Tract: Adrenal glands appear grossly normal. 8 mm hemorrhagic/proteinaceous cyst in the anterior right kidney. No suspicious renal mass identified. No hydronephrosis. Stomach/Bowel: Small hiatal hernia. No evidence of bowel  obstruction. Vascular/Lymphatic: No pathologically enlarged lymph nodes identified. No abdominal aortic aneurysm demonstrated. Other:  Trace ascites in the upper abdomen. Musculoskeletal: No suspicious bone

## 2021-05-05 ENCOUNTER — Encounter (HOSPITAL_BASED_OUTPATIENT_CLINIC_OR_DEPARTMENT_OTHER): Payer: Self-pay

## 2021-05-06 ENCOUNTER — Telehealth (HOSPITAL_BASED_OUTPATIENT_CLINIC_OR_DEPARTMENT_OTHER): Payer: Self-pay

## 2021-05-06 NOTE — Telephone Encounter (Signed)
? ?  Pre-operative Risk Assessment  ?  ?Patient Name: [name]  ?DOB: [birth year] ?MRN: [id number]  ? ?  ? ?Request for Surgical Clearance   ? ?Procedure:   Right Total Knee Replacement  ? ?Date of Surgery:  Clearance TBD                              ?   ?Surgeon:  Dr. Alvan Dame  ?Surgeon's Group or Practice Name:  [facility]  ?Phone number:  [phone] ?Fax number:  Ebony Hail [phone] ?  ?Type of Clearance Requested:   ?- Medical  ?- Pharmacy:  Hold Does patient need to hold anything?     ?  ?Type of Anesthesia:  Spinal ?  ?Additional requests/questions:  Please fax any testing/ requirments/medication holding to the above mentioned Person and fax ?Signed, ?Gerald Stabs   ?05/06/2021, 1:14 PM  ? ?

## 2021-05-07 NOTE — Telephone Encounter (Signed)
? ?  Name: [name]  ?DOB: December 12, 1950  ?MRN: [id number] ? ?Primary Cardiologist: [name] Red, MD ? ?Chart reviewed as part of pre-operative protocol coverage. Because of [name]'s past medical history and time since last visit, she will require a follow-up in-office visit in order to better assess preoperative cardiovascular risk. ? ?Patient was recently seen by Dr. Cristal [name] 03/30/21, note outlined with nonobstructive CAD by cath 09/2020, DM, HTN, and cMRI 09/2020 with moderate MR with SAM of the MV with asymmetric septal hypertrophy. At that visit she was complaining of exertional dyspnea, blood pressure was elevated, and her medications were adjusted with plan for close follow-up. She has an appointment scheduled in June but let's see if we can move up to May so she can be optimized prior to surgery. (Epic has surgery date as 5/23.) Bring BP readings to visit. ? ?Pre-op covering staff: ?- Please schedule appointment and call patient to inform them.  ?- Please contact requesting surgeon's office via preferred method (i.e, phone, fax) to inform them of need for appointment prior to surgery. ? ?Only blood thinner listed is ASA and no specific conditions bar holding, pending upcoming OV. ? ?[name] Montana, PA-C  ?05/07/2021, 10:07 AM  ? ? ? ?

## 2021-05-07 NOTE — Telephone Encounter (Signed)
I called the pt to see if she wanted to mover her appt sooner for pre op clearance or did she want to wait until her appt in 06/25/21 for pre op clearance. Pt asked for sooner appt as her surgery has been scheduled for now for 05/19/21. Pt tells me the surgeon office called her the other day and scheduled the surgery. I informed the pt that the surgeon's office needs to be sure to let the cardiologist office know that the date was scheduled as they are asking for clearance.  ? ?I was able to find an appt tomorrow with Dr. Buford Dresser 05/08/21 at [facility] location @ 1:20. I will update the requesting office the pt has appt tomorrow for pre op assessment.  ? ?Will forward notes to MD for upcoming appt tomorrow.  ?

## 2021-05-08 ENCOUNTER — Ambulatory Visit (HOSPITAL_BASED_OUTPATIENT_CLINIC_OR_DEPARTMENT_OTHER): Payer: Medicare Other | Admitting: Cardiology

## 2021-05-08 VITALS — BP 150/72 | HR 70 | Ht 62.0 in | Wt 167.6 lb

## 2021-05-08 DIAGNOSIS — Z01810 Encounter for preprocedural cardiovascular examination: Secondary | ICD-10-CM

## 2021-05-08 DIAGNOSIS — E1159 Type 2 diabetes mellitus with other circulatory complications: Secondary | ICD-10-CM

## 2021-05-08 DIAGNOSIS — I10 Essential (primary) hypertension: Secondary | ICD-10-CM

## 2021-05-08 DIAGNOSIS — I2510 Atherosclerotic heart disease of native coronary artery without angina pectoris: Secondary | ICD-10-CM | POA: Diagnosis not present

## 2021-05-08 DIAGNOSIS — I340 Nonrheumatic mitral (valve) insufficiency: Secondary | ICD-10-CM

## 2021-05-08 MED ORDER — AMLODIPINE BESYLATE 5 MG PO TABS: 5.0000 mg | ORAL_TABLET | Freq: Every day | ORAL | 3 refills | Status: DC

## 2021-05-08 NOTE — Patient Instructions (Signed)
Medication Instructions:  ?Your physician has recommended you make the following change in your medication:  ? ?Stop: Hydralazine  ? ?Start: Amlodipine 5mg  tablet daily ? ? ?*If you need a refill on your cardiac medications before your next appointment, please call your pharmacy* ? ? ?Lab Work: ?None ordered today  ? ?Testing/Procedures: ?You are cleared for surgery. Dr. Harrell Gave will send your clearance.  ? ? ?Follow-Up: ?At [facility] [place], you and your health needs are our priority.  As part of our continuing mission to provide you with exceptional heart care, we have created designated Provider Care Teams.  These Care Teams include your primary Cardiologist (physician) and Advanced Practice Providers (APPs -  Physician Assistants and Nurse Practitioners) who all work together to provide you with the care you need, when you need it. ? ?We recommend signing up for the patient portal called "MyChart".  Sign up information is provided on this After Visit Summary.  MyChart is used to connect with patients for Virtual Visits (Telemedicine).  Patients are able to view lab/test results, encounter notes, upcoming appointments, etc.  Non-urgent messages can be sent to your provider as well.   ?To learn more about what you can do with MyChart, go to [email or web address].   ? ?Your next appointment:   ?6 month(s) ? ?The format for your next appointment:   ?In Person ? ?Provider:   ?Buford Dresser, MD{ ? ?Other Instructions ?None today! ? ?Important Information About Sugar ? ? ? ? ? ? ?

## 2021-05-08 NOTE — Progress Notes (Signed)
?Cardiology Office Note:   ? ?Date:  05/18/2021  ? ?ID:  [name], DOB January 23, 1950, MRN [id number] ? ?PCP:  Jilda Panda, MD  ?Cardiologist:  Buford Dresser, MD PhD ? ?Referring MD: Jilda Panda, MD  ? ?CC: follow-up ? ?History of Present Illness:   ? ?[name] is a 71 y.o. female with a hx of type II diabetes, hypertension, sarcoidosis who is seen as a new consult at the request of Jilda Panda, MD for follow-up. She was initially seen 02/08/18 for the evaluation and management of chest pain. ? ?Today: ?Here for preoperative evaluation. Scheduled for total knee arthroplasty on 06/09/21 with Dr. Alvan Dame. Hoping to move it up to 05/19/21. Uncertain what the type of anesthesia will be used. ? ?History of anesthesia complications: no significant issues (once was a little slow to wake up but otherwise did fine). ?Current symptoms: feels great, rib pain improving as below. ?Functional capacity: Able to walk without chest pain, limited by knee pain. Only has two steps in the house. Had only trivial mild nonobstructive disease about 7 mos ago by cath. ? ?She was seen in the ER on 04/29/21 for chest wall pain. Noted acute rib pain after feeling a pop, worse with movement/breathing, tender on palpation. This is now nearly resolved. ? ?Did an ANA due to joint pain, returned as positive. Concerned about possible drug induced lupus. ? ?Doing well on carvedilol, feels much better. Blood pressure typically [id number] systolic since the change. ? ?Past Medical History:  ?Diagnosis Date  ? Arthritis   ? Asthma   ? CAD (coronary artery disease)   ? CHF (congestive heart failure) ([place])   ? Complication of anesthesia   ? trouble waking up once  ? COPD (chronic obstructive pulmonary disease) ([place])   ? Diabetes mellitus without complication ([place])   ? Dysrhythmia   ? Heart murmur   ? Hypertension   ? Hypothyroidism   ? Liver disease   ? NAFLD cirrhosis  ? Mild aortic stenosis   ? Pneumonia   ? Sarcoidosis   ? Sarcoidosis of lung ([place])    ? Sarcoidosis of lymph nodes   ? ? ?Past Surgical History:  ?Procedure Laterality Date  ? ABDOMINAL HYSTERECTOMY    ? ABDOMINAL SURGERY    ? CHOLECYSTECTOMY    ? ESOPHAGOGASTRODUODENOSCOPY (EGD) WITH PROPOFOL N/A 04/17/2021  ? Procedure: ESOPHAGOGASTRODUODENOSCOPY (EGD) WITH PROPOFOL;  Surgeon: Carol Ada, MD;  Location: [facility];  Service: Gastroenterology;  Laterality: N/A;  ? HERNIA REPAIR    ? LEFT HEART CATH AND CORONARY ANGIOGRAPHY N/A 09/23/2020  ? Procedure: LEFT HEART CATH AND CORONARY ANGIOGRAPHY;  Surgeon: Troy Sine, MD;  Location: [facility];  Service: Cardiovascular;  Laterality: N/A;  ? nissenfundiplication    ? ? ?Current Medications: ?Current Outpatient Medications on File Prior to Visit  ?Medication Sig  ? acyclovir (ZOVIRAX) 400 MG tablet Take 400 mg by mouth every 8 (eight) hours as needed. Fever blisters  ? albuterol (VENTOLIN HFA) 108 (90 Base) MCG/ACT inhaler Inhale 2 puffs into the lungs every 6 (six) hours as needed.  ? aspirin 81 MG chewable tablet Chew and swallow 1 tablet (81 mg total) by mouth daily.  ? calcium-vitamin D (OSCAL WITH D) 500-200 MG-UNIT tablet Take 1 tablet by mouth daily with breakfast.  ? carvedilol (COREG) 25 MG tablet Take 1 tablet (25 mg total) by mouth 2 (two) times daily with a meal.  ? cetirizine (ZYRTEC) 10 MG tablet Take 10 mg by  mouth daily.  ? cyclobenzaprine (FLEXERIL) 5 MG tablet Take 1 tablet (5 mg total) by mouth 2 (two) times daily as needed for muscle spasms.  ? furosemide (LASIX) 20 MG tablet Take 20-40 mg by mouth See admin instructions. Taking 20 mg every Sun., Tues., Thurs., and Sat. Taking 40 mg all other days.  ? gabapentin (NEURONTIN) 300 MG capsule Take 300 mg by mouth 3 (three) times daily.  ? irbesartan (AVAPRO) 150 MG tablet Take 1 tablet (150 mg total) by mouth daily.  ? levothyroxine (SYNTHROID, LEVOTHROID) 150 MCG tablet Take 75-150 mcg by mouth See admin instructions. Pt alternates between 150 mcg and 75 mcg each day with  before breakfast  ? LORazepam (ATIVAN) 1 MG tablet Take 1 mg by mouth 3 (three) times daily as needed for anxiety.  ? metFORMIN (GLUCOPHAGE-XR) 500 MG 24 hr tablet Take 2 tablets (1,000 mg total) by mouth 2 (two) times daily. (Patient taking differently: Take 1,000 mg by mouth daily with breakfast.)  ? oxyCODONE-acetaminophen (PERCOCET) 5-325 MG tablet Take 1-2 tablets by mouth every 6 (six) hours as needed. (Patient not taking: Reported on 05/11/2021)  ? rosuvastatin (CRESTOR) 10 MG tablet Take 10 mg by mouth daily.  ? zolpidem (AMBIEN) 10 MG tablet Take 10 mg by mouth at bedtime.  ? ?No current facility-administered medications on file prior to visit.  ?  ? ?Allergies:   Sulfa antibiotics, Atorvastatin, and Amoxicillin  ? ?Social History  ? ?Socioeconomic History  ? Marital status: Married  ?  Spouse name: Not on file  ? Number of children: Not on file  ? Years of education: Not on file  ? Highest education level: Not on file  ?Occupational History  ? Not on file  ?Tobacco Use  ? Smoking status: Never  ? Smokeless tobacco: Never  ?Vaping Use  ? Vaping Use: Never used  ?Substance and Sexual Activity  ? Alcohol use: No  ? Drug use: No  ? Sexual activity: Not Currently  ?Other Topics Concern  ? Not on file  ?Social History Narrative  ? Not on file  ? ?Social Determinants of Health  ? ?Financial Resource Strain: Not on file  ?Food Insecurity: Not on file  ?Transportation Needs: Not on file  ?Physical Activity: Not on file  ?Stress: Not on file  ?Social Connections: Not on file  ? 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 ? ?Family History: ?The patient's family history includes Breast cancer (age of onset: 65) in her mother; Heart attack in her brother, brother, father, and mother; Heart failure in her father; Hypertension in her father. ? ?ROS:   ?Please see the history of present illness.  Additional pertinent ROS: ?(+) knee pain ? ?EKGs/Labs/Other Studies Reviewed:   ? ?The following  studies were reviewed today: ?Cardiac MRI 09/25/20 ?IMPRESSION: ?Asymmetric septal hypertrophy without other evidence of infiltrative ?disease and systolic anterior motion of the mitral valve: This can ?be seen in hypertropic obstructive cardiomyopathy. ?  ?Moderate mitral regurgitation. ? ?L Heart Cath 09/23/20 ?  Prox Cx lesion is 20% stenosed. ?  ?Mild nonobstructive CAD with tortuous vessels and smooth 20% narrowing in the proximal circumflex far artery.  The LAD appears angiographically normal.  The right coronary artery is a dominant vessel with superior takeoff. ?  ?Mild LV to aorta gradient on pullback with a peak to peak gradient at approximately 16 mmHg. ?  ?RECOMMENDATION: ?Medical therapy. ? ?Echo 09/23/20 ? 1. Left ventricular ejection fraction,  by estimation, is 65 to 70%. The  ?left ventricle has normal function. The left ventricle has no regional  ?wall motion abnormalities. Left ventricular diastolic parameters are  ?consistent with Grade II diastolic  ?dysfunction (pseudonormalization).  ? 2. Right ventricular systolic function is normal. The right ventricular  ?size is normal.  ? 3. Left atrial size was mildly dilated.  ? 4. There is systolic anterior motion of the MV leaflet. . The mitral  ?valve is normal in structure. Moderate mitral valve regurgitation.  ? 5. The aortic valve is normal in structure. Aortic valve regurgitation is  ?not visualized.  ? ?Monitor 02/23/18 ?3 days of data recorded on Zio monitor. Patient had a min HR of 58 bpm, max HR of 129 bpm, and avg HR of 75 bpm. Predominant underlying rhythm was Sinus Rhythm. No VT, SVT, atrial fibrillation, high degree block, or pauses noted. Isolated atrial ectopy was rare (<1%). Overall PVC burden was 4.8%, with rare couplets. Longest ventricular bigeminy episode was 11 seconds, and longest ventricular trigeminy episode was 1 minute 50 seconds. There were no patient triggered events.  ? ?Lexiscan 02/23/18 ?Nuclear stress EF: 63%. ?The study is  normal. ?This is a low risk study. ?The left ventricular ejection fraction is normal (55-65%). ?  ?Normal stress nuclear study with no ischemia or infarction; EF 63 with normal wall motion.  ? ?Echo 2014 ?Marin Comment

## 2021-05-12 ENCOUNTER — Encounter (HOSPITAL_COMMUNITY): Payer: Self-pay

## 2021-05-12 NOTE — Patient Instructions (Addendum)
DUE TO COVID-19 ONLY TWO VISITORS  (aged 73 and older)  ARE ALLOWED TO COME WITH YOU AND STAY IN THE WAITING ROOM ONLY DURING PRE OP AND PROCEDURE.   ?**NO VISITORS ARE ALLOWED IN THE SHORT STAY AREA OR RECOVERY ROOM!!** ? ? ?IF YOU WILL BE ADMITTED INTO THE HOSPITAL YOU ARE ALLOWED ONLY FOUR SUPPORT PEOPLE DURING VISITATION HOURS ONLY (7 AM -8PM)   ?The support person(s) must pass our screening, gel in and out, and wear a mask at all times, including in the patient?s room. ?Patients must also wear a mask when staff or their support person are in the room. ?Visitors GUEST BADGE MUST BE WORN VISIBLY  ?One adult visitor may remain with you overnight and MUST be in the room by 8 P.M. ?  ? ? Your procedure is scheduled on: 05-19-21 ? ? Report to [facility] Main Entrance ? ?  Report to admitting at         [facility] AM ? ? Call this number if you have problems the morning of surgery [phone] ? ? Do not eat food :After Midnight. ? ? After Midnight you may have the following liquids until __0700 ____ AM DAY OF SURGERY  then nothing by mouth ? ?Water ?Black Coffee (sugar ok, NO MILK/CREAM OR CREAMERS)  ?Tea (sugar ok, NO MILK/CREAM OR CREAMERS) regular and decaf                             ?Plain Jell-O (NO RED)                                           ?Fruit ices (not with fruit pulp, NO RED)                                     ?Popsicles (NO RED)                                                                  ?Juice: apple, WHITE grape, WHITE cranberry ?Sports drinks like Gatorade (NO RED) ?Clear broth(vegetable,chicken,beef) ? ?            ?  ?       If you have questions, please contact your surgeon?s office. ? ? ?FOLLOW ANY ADDITIONAL PRE OP INSTRUCTIONS YOU RECEIVED FROM YOUR SURGEON'S OFFICE!!! ?  ?  ?Oral Hygiene is also important to reduce your risk of infection.                                    ?Remember - BRUSH YOUR TEETH THE MORNING OF SURGERY WITH YOUR REGULAR TOOTHPASTE ? ? Do NOT smoke after  Midnight ? ? Take these medicines the morning of surgery with A SIP OF WATER: rosuvastatin, lorazepam if needed, levothyroxine, amlodipine, gabapentin, zyrtec, inhaler Bring with you  ? ?DO NOT TAKE ANY ORAL DIABETIC MEDICATIONS DAY OF YOUR SURGERY ? ? ?                  ?  You may not have any metal on your body including hair pins, jewelry, and body piercing ? ?           Do not wear make-up, lotions, powders, perfumes/cologne, or deodorant ? ?Do not wear nail polish including gel and S&S, artificial/acrylic nails, or any other type of covering on natural nails including finger and toenails. If you have artificial nails, gel coating, etc. that needs to be removed by a nail salon please have this removed prior to surgery or surgery may need to be canceled/ delayed if the surgeon/ anesthesia feels like they are unable to be safely monitored.  ? ?Do not shave  48 hours prior to surgery.  ? ?            ? ? Do not bring valuables to the hospital. [facility] NOT ?            RESPONSIBLE   FOR VALUABLES. ? ? Contacts, dentures or bridgework may not be worn into surgery. ? ? Bring small overnight bag day of surgery. ?  ? Patients discharged on the day of surgery will not be allowed to drive home.  Someone NEEDS to stay with you for the first 24 hours after anesthesia. ? ?  ?            Please read over the following fact sheets you were given: IF [place] [phone] ? ?   [facility] - Preparing for Surgery ?Before surgery, you can play an important role.  Because skin is not sterile, your skin needs to be as free of germs as possible.  You can reduce the number of germs on your skin by washing with CHG (chlorahexidine gluconate) soap before surgery.  CHG is an antiseptic cleaner which kills germs and bonds with the skin to continue killing germs even after washing. ?Please DO NOT use if you have an allergy to CHG or antibacterial soaps.  If your skin  becomes reddened/irritated stop using the CHG and inform your nurse when you arrive at Short Stay. ?Do not shave (including legs and underarms) for at least 48 hours prior to the first CHG shower.  You may shave your face/neck. ?Please follow these instructions carefully: ? 1.  Shower with CHG Soap the night before surgery and the  morning of Surgery. ? 2.  If you choose to wash your hair, wash your hair first as usual with your  normal  shampoo. ? 3.  After you shampoo, rinse your hair and body thoroughly to remove the  shampoo.                           4.  Use CHG as you would any other liquid soap.  You can apply chg directly  to the skin and wash  ?                     Gently with a scrungie or clean washcloth. ? 5.  Apply the CHG Soap to your body ONLY FROM THE NECK DOWN.   Do not use on face/ open      ?                     Wound or open sores. Avoid contact with eyes, ears mouth and genitals (private parts).  ?  Wash face,  Genitals (private parts) with your normal soap. ?            6.  Wash thoroughly, paying special attention to the area where your surgery  will be performed. ? 7.  Thoroughly rinse your body with warm water from the neck down. ? 8.  DO NOT shower/wash with your normal soap after using and rinsing off  the CHG Soap. ?               9.  Pat yourself dry with a clean towel. ?           10.  Wear clean pajamas. ?           11.  Place clean sheets on your bed the night of your first shower and do not  sleep with pets. ?Day of Surgery : ?Do not apply any lotions/deodorants the morning of surgery.  Please wear clean clothes to the hospital/surgery center. ? ?FAILURE TO FOLLOW THESE INSTRUCTIONS MAY RESULT IN THE CANCELLATION OF YOUR SURGERY ?PATIENT SIGNATURE_________________________________ ? ?NURSE SIGNATURE__________________________________ ? ?________________________________________________________________________  ? ?Incentive Spirometer ? ?An incentive spirometer is a  tool that can help keep your lungs clear and active. This tool measures how well you are filling your lungs with each breath. Taking long deep breaths may help reverse or decrease the chance of developing breathing (pulmonary) problems (especially infection) following: ?A long period of time when you are unable to move or be active. ?BEFORE THE PROCEDURE  ?If the spirometer includes an indicator to show your best effort, your nurse or respiratory therapist will set it to a desired goal. ?If possible, sit up straight or lean slightly forward. Try not to slouch. ?Hold the incentive spirometer in an upright position. ?INSTRUCTIONS FOR USE  ?Sit on the edge of your bed if possible, or sit up as far as you can in bed or on a chair. ?Hold the incentive spirometer in an upright position. ?Breathe out normally. ?Place the mouthpiece in your mouth and seal your lips tightly around it. ?Breathe in slowly and as deeply as possible, raising the piston or the ball toward the top of the column. ?Hold your breath for 3-5 seconds or for as long as possible. Allow the piston or ball to fall to the bottom of the column. ?Remove the mouthpiece from your mouth and breathe out normally. ?Rest for a few seconds and repeat Steps 1 through 7 at least 10 times every 1-2 hours when you are awake. Take your time and take a few normal breaths between deep breaths. ?The spirometer may include an indicator to show your best effort. Use the indicator as a goal to work toward during each repetition. ?After each set of 10 deep breaths, practice coughing to be sure your lungs are clear. If you have an incision (the cut made at the time of surgery), support your incision when coughing by placing a pillow or rolled up towels firmly against it. ?Once you are able to get out of bed, walk around indoors and cough well. You may stop using the incentive spirometer when instructed by your caregiver.  ?RISKS AND COMPLICATIONS ?Take your time so you do not  get dizzy or light-headed. ?If you are in pain, you may need to take or ask for pain medication before doing incentive spirometry. It is harder to take a deep breath if you are having pain. ?AFTER USE ?Rest and b

## 2021-05-12 NOTE — Progress Notes (Addendum)
PCP - Girtha Hake, MD clearance 05-05-21 on chart ?Cardiologist - Buford Dresser, MD LOV 05-08-21 epic clearance ? ?PPM/ICD -  ?Device Orders -  ?Rep Notified -  ? ?Chest x-ray -  CT chest- 03-06-21 epic ?EKG - 05-04-21 epic ?Stress Test -  ?ECHO - 09-23-20 epic ?Cardiac Cath - 09-23-20 epic ?MR cardiac- 09-25-20 ?CBC,BMP 04-29-21  Hgba1c 03-25-21 on chart CMP 04-28-21 ? ?Sleep Study -  ?CPAP -  ? ?Fasting Blood Sugar - 100 ?Checks Blood Sugar __some times___  ? ?Blood Thinner Instructions: ?Aspirin Instructions:81 mg asa  stop 1 week ? ?ERAS Protcol - ?PRE-SURGERY  or G2-  ? ?COVID vaccine -x4 moderna ? ?Activity-- SOB with activities not new for patient Better since carvedilol was increased per pt. ?Anesthesia review: Sarcodosis, mild arotic stenosis, HTN, DM, hepatic cirrhosis (nonalcoholic), CAD ? ?Patient denies shortness of breath, fever, cough and chest pain at PAT appointment ? ? ?All instructions explained to the patient, with a verbal understanding of the material. Patient agrees to go over the instructions while at home for a better understanding. Patient also instructed to self quarantine after being tested for COVID-19. The opportunity to ask questions was provided. ?  ?

## 2021-05-14 ENCOUNTER — Encounter (HOSPITAL_COMMUNITY): Payer: Self-pay

## 2021-05-14 ENCOUNTER — Other Ambulatory Visit: Payer: Self-pay

## 2021-05-14 ENCOUNTER — Encounter (HOSPITAL_COMMUNITY)
Admission: RE | Admit: 2021-05-14 | Discharge: 2021-05-14 | Disposition: A | Discharge status: Home / Self Care | Payer: Medicare Other | Source: Ambulatory Visit | Attending: Orthopedic Surgery | Admitting: Orthopedic Surgery

## 2021-05-14 VITALS — BP 125/57 | HR 58 | Temp 97.6°F | Ht 62.0 in | Wt 165.0 lb

## 2021-05-14 DIAGNOSIS — J449 Chronic obstructive pulmonary disease, unspecified: Secondary | ICD-10-CM | POA: Diagnosis not present

## 2021-05-14 DIAGNOSIS — Z01818 Encounter for other preprocedural examination: Secondary | ICD-10-CM

## 2021-05-14 DIAGNOSIS — Z7984 Long term (current) use of oral hypoglycemic drugs: Secondary | ICD-10-CM | POA: Insufficient documentation

## 2021-05-14 DIAGNOSIS — K760 Fatty (change of) liver, not elsewhere classified: Secondary | ICD-10-CM | POA: Insufficient documentation

## 2021-05-14 DIAGNOSIS — E1165 Type 2 diabetes mellitus with hyperglycemia: Secondary | ICD-10-CM | POA: Insufficient documentation

## 2021-05-14 DIAGNOSIS — K769 Liver disease, unspecified: Secondary | ICD-10-CM | POA: Diagnosis not present

## 2021-05-14 DIAGNOSIS — I2510 Atherosclerotic heart disease of native coronary artery without angina pectoris: Secondary | ICD-10-CM | POA: Diagnosis not present

## 2021-05-14 DIAGNOSIS — Z01812 Encounter for preprocedural laboratory examination: Secondary | ICD-10-CM | POA: Diagnosis present

## 2021-05-14 DIAGNOSIS — D869 Sarcoidosis, unspecified: Secondary | ICD-10-CM | POA: Diagnosis not present

## 2021-05-14 DIAGNOSIS — I509 Heart failure, unspecified: Secondary | ICD-10-CM | POA: Diagnosis not present

## 2021-05-14 DIAGNOSIS — Z7982 Long term (current) use of aspirin: Secondary | ICD-10-CM | POA: Insufficient documentation

## 2021-05-14 DIAGNOSIS — M1711 Unilateral primary osteoarthritis, right knee: Secondary | ICD-10-CM | POA: Diagnosis not present

## 2021-05-14 DIAGNOSIS — I110 Hypertensive heart disease with heart failure: Secondary | ICD-10-CM | POA: Diagnosis not present

## 2021-05-14 DIAGNOSIS — K7460 Unspecified cirrhosis of liver: Secondary | ICD-10-CM | POA: Diagnosis not present

## 2021-05-14 HISTORY — DX: Heart failure, unspecified: I50.9

## 2021-05-14 HISTORY — DX: Pneumonia, unspecified organism: J18.9

## 2021-05-14 HISTORY — DX: Chronic obstructive pulmonary disease, unspecified: J44.9

## 2021-05-14 HISTORY — DX: Cardiac murmur, unspecified: R01.1

## 2021-05-14 HISTORY — DX: Nonrheumatic aortic (valve) stenosis: I35.0

## 2021-05-14 HISTORY — DX: Cardiac arrhythmia, unspecified: I49.9

## 2021-05-14 HISTORY — DX: Unspecified asthma, uncomplicated: J45.909

## 2021-05-14 HISTORY — DX: Unspecified osteoarthritis, unspecified site: M19.90

## 2021-05-14 LAB — SURGICAL PCR SCREEN
MRSA, PCR: NEGATIVE
Staphylococcus aureus: NEGATIVE

## 2021-05-14 LAB — GLUCOSE, CAPILLARY: Glucose-Capillary: 103 mg/dL — ABNORMAL HIGH (ref 70–99)

## 2021-05-14 NOTE — Progress Notes (Signed)
Please place orders in epic pt. Has preop today ?

## 2021-05-14 NOTE — H&P (Signed)
TOTAL KNEE ADMISSION H&P ? ?Patient is being admitted for right total knee arthroplasty. ? ?Subjective: ? ?Chief Complaint:right knee pain. ? ?HPI: [name], 71 y.o. female, has a history of pain and functional disability in the right knee due to arthritis and has failed non-surgical conservative treatments for greater than 12 weeks to includeNSAID's and/or analgesics and activity modification.  Onset of symptoms was gradual, starting 2 years ago with gradually worsening course since that time. The patient noted no past surgery on the right knee(s).  Patient currently rates pain in the right knee(s) at 9 out of 10 with activity. Patient has worsening of pain with activity and weight bearing, pain that interferes with activities of daily living, and pain with passive range of motion.  Patient has evidence of joint space narrowing by imaging studies.  There is no active infection. ? ?Patient Active Problem List  ? Diagnosis Date Noted  ? Systolic anterior movement of mitral valve 03/30/2021  ? Chest pain 09/23/2020  ? Thrombocytopenia ([place]) 09/23/2020  ? Dehydration 06/22/2012  ? AKI (acute kidney injury) ([place]) 06/22/2012  ? Hypothyroidism 06/22/2012  ? HTN (hypertension) 06/22/2012  ? Uncontrolled type 2 diabetes mellitus with hyperglycemia ([place]) 06/22/2012  ? Sarcoidosis 06/22/2012  ? Heart murmur 06/22/2012  ? UTI (urinary tract infection) 06/22/2012  ? ?Past Medical History:  ?Diagnosis Date  ? Arthritis   ? Asthma   ? CAD (coronary artery disease)   ? CHF (congestive heart failure) ([place])   ? Complication of anesthesia   ? trouble waking up once  ? COPD (chronic obstructive pulmonary disease) ([place])   ? Diabetes mellitus without complication ([place])   ? Dysrhythmia   ? Heart murmur   ? Hypertension   ? Hypothyroidism   ? Liver disease   ? NAFLD cirrhosis  ? Mild aortic stenosis   ? Pneumonia   ? Sarcoidosis   ? Sarcoidosis of lung ([place])   ? Sarcoidosis of lymph nodes   ?  ?Past Surgical History:  ?Procedure  Laterality Date  ? ABDOMINAL HYSTERECTOMY    ? ABDOMINAL SURGERY    ? CHOLECYSTECTOMY    ? ESOPHAGOGASTRODUODENOSCOPY (EGD) WITH PROPOFOL N/A 04/17/2021  ? Procedure: ESOPHAGOGASTRODUODENOSCOPY (EGD) WITH PROPOFOL;  Surgeon: Carol Ada, MD;  [facility];  Service: Gastroenterology;  Laterality: N/A;  ? HERNIA REPAIR    ? LEFT HEART CATH AND CORONARY ANGIOGRAPHY N/A 09/23/2020  ? Procedure: LEFT HEART CATH AND CORONARY ANGIOGRAPHY;  Surgeon: Troy Sine, MD;  Location: [place] [facility];  Service: Cardiovascular;  Laterality: N/A;  ? nissenfundiplication    ?  ?No current facility-administered medications for this encounter.  ? ?Current Outpatient Medications  ?Medication Sig Dispense Refill Last Dose  ? acyclovir (ZOVIRAX) 400 MG tablet Take 400 mg by mouth every 8 (eight) hours as needed. Fever blisters     ? albuterol (VENTOLIN HFA) 108 (90 Base) MCG/ACT inhaler Inhale 2 puffs into the lungs every 6 (six) hours as needed. 18 g 5   ? amLODipine (NORVASC) 5 MG tablet Take 1 tablet (5 mg total) by mouth daily. 90 tablet 3   ? aspirin 81 MG chewable tablet Chew and swallow 1 tablet (81 mg total) by mouth daily. 30 tablet 3   ? calcium-vitamin D (OSCAL WITH D) 500-200 MG-UNIT tablet Take 1 tablet by mouth daily with breakfast.     ? carvedilol (COREG) 25 MG tablet Take 1 tablet (25 mg total) by mouth 2 (two) times daily with a meal.  180 tablet 3   ? cetirizine (ZYRTEC) 10 MG tablet Take 10 mg by mouth daily.     ? cyclobenzaprine (FLEXERIL) 5 MG tablet Take 1 tablet (5 mg total) by mouth 2 (two) times daily as needed for muscle spasms. 20 tablet 0   ? furosemide (LASIX) 20 MG tablet Take 20-40 mg by mouth See admin instructions. Taking 20 mg every Sun., Tues., Thurs., and Sat. Taking 40 mg all other days.     ? gabapentin (NEURONTIN) 300 MG capsule Take 300 mg by mouth 3 (three) times daily.     ? irbesartan (AVAPRO) 150 MG tablet Take 1 tablet (150 mg total) by mouth daily. 30 tablet 11   ?  levothyroxine (SYNTHROID, LEVOTHROID) 150 MCG tablet Take 75-150 mcg by mouth See admin instructions. Pt alternates between 150 mcg and 75 mcg each day with before breakfast     ? LORazepam (ATIVAN) 1 MG tablet Take 1 mg by mouth 3 (three) times daily as needed for anxiety.     ? metFORMIN (GLUCOPHAGE-XR) 500 MG 24 hr tablet Take 2 tablets (1,000 mg total) by mouth 2 (two) times daily. (Patient taking differently: Take 1,000 mg by mouth daily with breakfast.) 120 tablet 3   ? rosuvastatin (CRESTOR) 10 MG tablet Take 10 mg by mouth daily.     ? zolpidem (AMBIEN) 10 MG tablet Take 10 mg by mouth at bedtime.     ? oxyCODONE-acetaminophen (PERCOCET) 5-325 MG tablet Take 1-2 tablets by mouth every 6 (six) hours as needed. (Patient not taking: Reported on 05/11/2021) 20 tablet 0 Not Taking  ? traMADol (ULTRAM) 50 MG tablet Take by mouth 2 (two) times daily.     ? ?Allergies  ?Allergen Reactions  ? Sulfa Antibiotics Other (See Comments)  ?  High fever ?Body ache  ? Atorvastatin Other (See Comments)  ?  Extreme muscle cramps and muscle aches  ? Amoxicillin Rash  ?  ?Social History  ? ?Tobacco Use  ? Smoking status: Never  ? Smokeless tobacco: Never  ?Substance Use Topics  ? Alcohol use: No  ?  ?Family History  ?Problem Relation Age of Onset  ? Heart failure Father   ? Hypertension Father   ? Heart attack Father   ? Heart attack Mother   ? Breast cancer Mother 22  ? Heart attack Brother   ? Heart attack Brother   ?  ? ?Review of Systems  ?Constitutional:  Negative for chills and fever.  ?Respiratory:  Negative for cough and shortness of breath.   ?Cardiovascular:  Negative for chest pain.  ?Gastrointestinal:  Negative for nausea and vomiting.  ?Musculoskeletal:  Positive for arthralgias.  ? ? ?Objective: ? ?Physical Exam ?Well nourished and well developed. ?General: Alert and oriented x3, cooperative and pleasant, no acute distress. ?Head: normocephalic, atraumatic, neck supple. ?Eyes: EOMI. ? ?Musculoskeletal: ?Right Knee:  Mild tenderness to palpation about the medial joint line of the right knee. AROM 0-120 degrees. No effusion noted. No instability. No patellofemoral crepitus. ? ?Calves soft and nontender. Motor function intact in LE. Strength 5/5 LE bilaterally. ?Neuro: Distal pulses 2+. Sensation to light touch intact in LE. ? ?Vital signs in last 24 hours: ?Temp:  [97.6 ?F (36.4 ?C)] 97.6 ?F (36.4 ?C) (04/27 1417) ?Pulse Rate:  [58] 58 (04/27 1417) ?BP: (125)/(57) 125/57 (04/27 1417) ?SpO2:  [98 %] 98 % (04/27 1417) ?Weight:  [74.8 kg] 74.8 kg (04/27 1417) ? ?Labs: ? ? ?Estimated body mass index is 30.18 kg/m? as  calculated from the following: ?  Height as of 05/14/21: 5\' 2"  (1.575 m). ?  Weight as of 05/14/21: 74.8 kg. ? ? ?Imaging Review ?Plain radiographs demonstrate severe degenerative joint disease of the right knee(s). The overall alignment isneutral. The bone quality appears to be adequate for age and reported activity level. ? ? ? ? ? ?Assessment/Plan: ? ?End stage arthritis, right knee  ? ?The patient history, physical examination, clinical judgment of the provider and imaging studies are consistent with end stage degenerative joint disease of the right knee(s) and total knee arthroplasty is deemed medically necessary. The treatment options including medical management, injection therapy arthroscopy and arthroplasty were discussed at length. The risks and benefits of total knee arthroplasty were presented and reviewed. The risks due to aseptic loosening, infection, stiffness, patella tracking problems, thromboembolic complications and other imponderables were discussed. The patient acknowledged the explanation, agreed to proceed with the plan and consent was signed. Patient is being admitted for inpatient treatment for surgery, pain control, PT, OT, prophylactic antibiotics, VTE prophylaxis, progressive ambulation and ADL's and discharge planning. The patient is planning to be discharged  home. ? ?Therapy Plans: outpatient  the[facility] at [facility] ?Disposition: Home with husband ?Planned DVT Prophylaxis: aspirin 81mg  BID ?DME needed: none ?PCP: Dr. Mellody Drown, clearance received ?Cardiologist: Dr. Harrell Gave, clearance received ?GI: Dr. Collene Mares, clear

## 2021-05-15 NOTE — Progress Notes (Signed)
Anesthesia Chart Review ? ? Case: [id number] Date/Time: 05/19/21 0950  ? Procedure: TOTAL KNEE ARTHROPLASTY (Right: Knee)  ? Anesthesia type: Spinal  ? Pre-op diagnosis: Right knee osteoarthritis  ? [facility] 09 / [facility]  ? Surgeons: Paralee Cancel, MD  ? ?  ? ? ?DISCUSSION:71 y.o. never smoker with h/o HTN, COPD, sarcoidosis, CAD, CHF, DM II, NAFLD cirrhosis, right knee OA scheduled for above procedure 05/19/21 with Dr. Paralee Cancel.  ? ?Pt last seen by cardiology 05/08/21. Per OV note, "Preoperative cardiovascular exam ?Based on available date, patient's RCRI score = , which carries a (0-3.9, 1-6, 2-10.1, 3 or more-15)% 30-day risk of death, MI, or cardiac arrest. ?  ?The patient is not currently having active cardiac symptoms, and she has recently had a full cardiovascular evaluation in the last year without obstructive CAD. ?  ?According to ACC/AHA Guidelines, no further testing is needed.  Proceed with surgery at acceptable risk.  Our service is available as needed in the peri-operative period.   Ok to hold aspirin perioperatively." ? ?Anticipate pt can proceed with planned procedure barring acute status change.   ?VS: BP (!) 125/57   Pulse (!) 58   Temp 36.4 ?C (Oral)   Ht 5\' 2"  (1.575 m)   Wt 74.8 kg   SpO2 98%   BMI 30.18 kg/m?  ? ?PROVIDERS: ?Jilda Panda, MD is PCP  ? ?Buford Dresser, MD is Cardiologist  ?LABS: Labs reviewed: Acceptable for surgery. ?(all labs ordered are listed, but only abnormal results are displayed) ? ?Labs Reviewed  ?GLUCOSE, CAPILLARY - Abnormal; Notable for the following components:  ?    Result Value  ? Glucose-Capillary 103 (*)   ? All other components within normal limits  ?SURGICAL PCR SCREEN  ? ? ? ?IMAGES: ? ? ?EKG: ?05/04/2021 ?Rate 76 bpm  ?Sinus rhythm  ?Borderline T wave abnormalities ? ? ?CV: ?Cardiac Cath 09/23/2020 ?  Prox Cx lesion is 20% stenosed. ?  ?Mild nonobstructive CAD with tortuous vessels and smooth 20% narrowing in the proximal circumflex far  artery.  The LAD appears angiographically normal.  The right coronary artery is a dominant vessel with superior takeoff. ?  ?Mild LV to aorta gradient on pullback with a peak to peak gradient at approximately 16 mmHg. ?  ?RECOMMENDATION: ?Medical therapy. ? ?Echo 09/23/2020 ?1. Left ventricular ejection fraction, by estimation, is 65 to 70%. The  ?left ventricle has normal function. The left ventricle has no regional  ?wall motion abnormalities. Left ventricular diastolic parameters are  ?consistent with Grade II diastolic  ?dysfunction (pseudonormalization).  ? 2. Right ventricular systolic function is normal. The right ventricular  ?size is normal.  ? 3. Left atrial size was mildly dilated.  ? 4. There is systolic anterior motion of the MV leaflet. . The mitral  ?valve is normal in structure. Moderate mitral valve regurgitation.  ? 5. The aortic valve is normal in structure. Aortic valve regurgitation is  ?not visualized.  ? ?Myocardial Perfusion 02/23/2018 ?Nuclear stress EF: 63%. ?The study is normal. ?This is a low risk study. ?The left ventricular ejection fraction is normal (55-65%). ?  ?Normal stress nuclear study with no ischemia or infarction; EF 63 with normal wall motion.  ?Past Medical History:  ?Diagnosis Date  ? Arthritis   ? Asthma   ? CAD (coronary artery disease)   ? CHF (congestive heart failure) ([place])   ? Complication of anesthesia   ? trouble waking up once  ? COPD (chronic obstructive  pulmonary disease) ([place])   ? Diabetes mellitus without complication ([place])   ? Dysrhythmia   ? Heart murmur   ? Hypertension   ? Hypothyroidism   ? Liver disease   ? NAFLD cirrhosis  ? Mild aortic stenosis   ? Pneumonia   ? Sarcoidosis   ? Sarcoidosis of lung ([place])   ? Sarcoidosis of lymph nodes   ? ? ?Past Surgical History:  ?Procedure Laterality Date  ? ABDOMINAL HYSTERECTOMY    ? ABDOMINAL SURGERY    ? CHOLECYSTECTOMY    ? ESOPHAGOGASTRODUODENOSCOPY (EGD) WITH PROPOFOL N/A 04/17/2021  ? Procedure:  ESOPHAGOGASTRODUODENOSCOPY (EGD) WITH PROPOFOL;  Surgeon: Carol Ada, MD;  [facility];  Service: Gastroenterology;  Laterality: N/A;  ? HERNIA REPAIR    ? LEFT HEART CATH AND CORONARY ANGIOGRAPHY N/A 09/23/2020  ? Procedure: LEFT HEART CATH AND CORONARY ANGIOGRAPHY;  Surgeon: Troy Sine, MD;  Location: [place] [facility];  Service: Cardiovascular;  Laterality: N/A;  ? nissenfundiplication    ? ? ?MEDICATIONS: ? traMADol (ULTRAM) 50 MG tablet  ? acyclovir (ZOVIRAX) 400 MG tablet  ? albuterol (VENTOLIN HFA) 108 (90 Base) MCG/ACT inhaler  ? amLODipine (NORVASC) 5 MG tablet  ? aspirin 81 MG chewable tablet  ? calcium-vitamin D (OSCAL WITH D) 500-200 MG-UNIT tablet  ? carvedilol (COREG) 25 MG tablet  ? cetirizine (ZYRTEC) 10 MG tablet  ? cyclobenzaprine (FLEXERIL) 5 MG tablet  ? furosemide (LASIX) 20 MG tablet  ? gabapentin (NEURONTIN) 300 MG capsule  ? irbesartan (AVAPRO) 150 MG tablet  ? levothyroxine (SYNTHROID, LEVOTHROID) 150 MCG tablet  ? LORazepam (ATIVAN) 1 MG tablet  ? metFORMIN (GLUCOPHAGE-XR) 500 MG 24 hr tablet  ? oxyCODONE-acetaminophen (PERCOCET) 5-325 MG tablet  ? rosuvastatin (CRESTOR) 10 MG tablet  ? zolpidem (AMBIEN) 10 MG tablet  ? ?No current facility-administered medications for this encounter.  ? ? ?Konrad Felix Ward, PA-C ?[facility] ?[phone] ? ? ? ? ? ? ?

## 2021-05-15 NOTE — Anesthesia Preprocedure Evaluation (Addendum)
Anesthesia Evaluation  ?Patient identified by MRN, date of birth, ID band ?Patient awake ? ? ? ?Reviewed: ?Allergy & Precautions, NPO status , Patient's Chart, lab work & pertinent test results ? ?Airway ?Mallampati: II ? ?TM Distance: >3 FB ?Neck ROM: Full ? ? ? Dental ?no notable dental hx. ? ?  ?Pulmonary ?asthma , COPD,  ?  ?Pulmonary exam normal ?breath sounds clear to auscultation ? ? ? ? ? ? Cardiovascular ?hypertension, Pt. on medications ?+ CAD  ?Normal cardiovascular exam ?Rhythm:Regular Rate:Normal ? ? ?  ?Neuro/Psych ?negative neurological ROS ? negative psych ROS  ? GI/Hepatic ?negative GI ROS, Neg liver ROS,   ?Endo/Other  ?diabetes, Type 2Hypothyroidism  ? Renal/GU ?negative Renal ROS  ?negative genitourinary ?  ?Musculoskeletal ? ?(+) Arthritis , Osteoarthritis,   ? Abdominal ?(+) + obese,   ?Peds ?negative pediatric ROS ?(+)  Hematology ?negative hematology ROS ?(+)   ?Anesthesia Other Findings ? ? Reproductive/Obstetrics ?negative OB ROS ? ?  ? ? ? ? ? ? ? ? ? ? ? ? ? ?  ?  ? ? ? ? ? ? ? ?Anesthesia Physical ?Anesthesia Plan ? ?ASA: 3 ? ?Anesthesia Plan: Spinal  ? ?Post-op Pain Management: Regional block*  ? ?Induction: Intravenous ? ?PONV Risk Score and Plan: 2 and Ondansetron, Midazolam and Treatment may vary due to age or medical condition ? ?Airway Management Planned: Simple Face Mask ? ?Additional Equipment:  ? ?Intra-op Plan:  ? ?Post-operative Plan:  ? ?Informed Consent: I have reviewed the patients History and Physical, chart, labs and discussed the procedure including the risks, benefits and alternatives for the proposed anesthesia with the patient or authorized representative who has indicated his/her understanding and acceptance.  ? ? ? ?Dental advisory given ? ?Plan Discussed with: CRNA ? ?Anesthesia Plan Comments: (See PAT note 05/14/2021)  ? ? ? ? ? ?Anesthesia Quick Evaluation ? ?

## 2021-05-18 ENCOUNTER — Encounter (HOSPITAL_BASED_OUTPATIENT_CLINIC_OR_DEPARTMENT_OTHER): Payer: Self-pay | Admitting: Cardiology

## 2021-05-19 ENCOUNTER — Encounter (HOSPITAL_COMMUNITY)
Admission: RE | Disposition: A | Discharge status: Home / Self Care | Payer: Self-pay | Source: Home / Self Care | Attending: Orthopedic Surgery

## 2021-05-19 ENCOUNTER — Encounter (HOSPITAL_COMMUNITY): Payer: Self-pay | Admitting: Orthopedic Surgery

## 2021-05-19 ENCOUNTER — Other Ambulatory Visit: Payer: Self-pay

## 2021-05-19 ENCOUNTER — Ambulatory Visit (HOSPITAL_BASED_OUTPATIENT_CLINIC_OR_DEPARTMENT_OTHER): Payer: Medicare Other | Admitting: Certified Registered"

## 2021-05-19 ENCOUNTER — Observation Stay (HOSPITAL_COMMUNITY)
Admission: RE | Admit: 2021-05-19 | Discharge: 2021-05-20 | Disposition: A | Discharge status: Home / Self Care | Payer: Medicare Other | Attending: Orthopedic Surgery | Admitting: Orthopedic Surgery

## 2021-05-19 ENCOUNTER — Ambulatory Visit (HOSPITAL_COMMUNITY): Payer: Medicare Other | Admitting: Physician Assistant

## 2021-05-19 DIAGNOSIS — I10 Essential (primary) hypertension: Secondary | ICD-10-CM | POA: Diagnosis not present

## 2021-05-19 DIAGNOSIS — Z79899 Other long term (current) drug therapy: Secondary | ICD-10-CM | POA: Insufficient documentation

## 2021-05-19 DIAGNOSIS — J45909 Unspecified asthma, uncomplicated: Secondary | ICD-10-CM | POA: Insufficient documentation

## 2021-05-19 DIAGNOSIS — I509 Heart failure, unspecified: Secondary | ICD-10-CM | POA: Diagnosis not present

## 2021-05-19 DIAGNOSIS — I2510 Atherosclerotic heart disease of native coronary artery without angina pectoris: Secondary | ICD-10-CM | POA: Diagnosis not present

## 2021-05-19 DIAGNOSIS — Z7982 Long term (current) use of aspirin: Secondary | ICD-10-CM | POA: Diagnosis not present

## 2021-05-19 DIAGNOSIS — Z01818 Encounter for other preprocedural examination: Secondary | ICD-10-CM

## 2021-05-19 DIAGNOSIS — Z7984 Long term (current) use of oral hypoglycemic drugs: Secondary | ICD-10-CM | POA: Insufficient documentation

## 2021-05-19 DIAGNOSIS — E119 Type 2 diabetes mellitus without complications: Secondary | ICD-10-CM | POA: Diagnosis not present

## 2021-05-19 DIAGNOSIS — J449 Chronic obstructive pulmonary disease, unspecified: Secondary | ICD-10-CM | POA: Insufficient documentation

## 2021-05-19 DIAGNOSIS — E1165 Type 2 diabetes mellitus with hyperglycemia: Secondary | ICD-10-CM

## 2021-05-19 DIAGNOSIS — E039 Hypothyroidism, unspecified: Secondary | ICD-10-CM | POA: Diagnosis not present

## 2021-05-19 DIAGNOSIS — Z96651 Presence of right artificial knee joint: Secondary | ICD-10-CM

## 2021-05-19 DIAGNOSIS — I110 Hypertensive heart disease with heart failure: Secondary | ICD-10-CM | POA: Diagnosis not present

## 2021-05-19 DIAGNOSIS — M1711 Unilateral primary osteoarthritis, right knee: Principal | ICD-10-CM | POA: Insufficient documentation

## 2021-05-19 HISTORY — PX: TOTAL KNEE ARTHROPLASTY: SHX125

## 2021-05-19 LAB — GLUCOSE, CAPILLARY
Glucose-Capillary: 96 mg/dL (ref 70–99)
Glucose-Capillary: 119 mg/dL — ABNORMAL HIGH (ref 70–99)
Glucose-Capillary: 271 mg/dL — ABNORMAL HIGH (ref 70–99)
Glucose-Capillary: 205 mg/dL — ABNORMAL HIGH (ref 70–99)

## 2021-05-19 LAB — HEMOGLOBIN A1C
Hgb A1c MFr Bld: 5.5 % (ref 4.8–5.6)
Mean Plasma Glucose: 111.15 mg/dL

## 2021-05-19 SURGERY — ARTHROPLASTY, KNEE, TOTAL
Anesthesia: Spinal | Site: Knee | Laterality: Right

## 2021-05-19 MED ORDER — MENTHOL 3 MG MT LOZG: 1.0000 | LOZENGE | OROMUCOSAL | Status: DC | PRN

## 2021-05-19 MED ORDER — OXYCODONE HCL 5 MG PO TABS
5.0000 mg | ORAL_TABLET | ORAL | Status: DC | PRN
  Administered 2021-05-19 (×3): 10 mg via ORAL
  Filled 2021-05-19 (×4): qty 2

## 2021-05-19 MED ORDER — POVIDONE-IODINE 10 % EX SWAB
2.0000 "application " | Freq: Once | CUTANEOUS | Status: AC
  Administered 2021-05-19: 2 via TOPICAL

## 2021-05-19 MED ORDER — HYDROMORPHONE HCL 1 MG/ML IJ SOLN: 0.5000 mg | INTRAMUSCULAR | Status: DC | PRN

## 2021-05-19 MED ORDER — LEVOTHYROXINE SODIUM 75 MCG PO TABS: 150.0000 ug | ORAL_TABLET | ORAL | Status: DC

## 2021-05-19 MED ORDER — KETOROLAC TROMETHAMINE 30 MG/ML IJ SOLN
INTRAMUSCULAR | Status: AC
  Filled 2021-05-19: qty 1

## 2021-05-19 MED ORDER — ROSUVASTATIN CALCIUM 10 MG PO TABS
10.0000 mg | ORAL_TABLET | Freq: Every day | ORAL | Status: DC
  Administered 2021-05-20: 10 mg via ORAL
  Filled 2021-05-19: qty 1

## 2021-05-19 MED ORDER — PHENOL 1.4 % MT LIQD: 1.0000 | OROMUCOSAL | Status: DC | PRN

## 2021-05-19 MED ORDER — ONDANSETRON HCL 4 MG/2ML IJ SOLN
INTRAMUSCULAR | Status: AC
  Filled 2021-05-19: qty 2

## 2021-05-19 MED ORDER — BUPIVACAINE IN DEXTROSE 0.75-8.25 % IT SOLN
INTRATHECAL | Status: DC | PRN
  Administered 2021-05-19: 1.6 mL via INTRATHECAL

## 2021-05-19 MED ORDER — SODIUM CHLORIDE 0.9 % IV SOLN: INTRAVENOUS | Status: DC

## 2021-05-19 MED ORDER — OXYCODONE HCL 5 MG PO TABS
10.0000 mg | ORAL_TABLET | ORAL | Status: DC | PRN
  Administered 2021-05-20 (×3): 10 mg via ORAL
  Filled 2021-05-19 (×2): qty 2

## 2021-05-19 MED ORDER — BUPIVACAINE-EPINEPHRINE (PF) 0.25% -1:200000 IJ SOLN
INTRAMUSCULAR | Status: AC
  Filled 2021-05-19: qty 30

## 2021-05-19 MED ORDER — FUROSEMIDE 20 MG PO TABS: 20.0000 mg | ORAL_TABLET | ORAL | Status: DC

## 2021-05-19 MED ORDER — ORAL CARE MOUTH RINSE: 15.0000 mL | Freq: Once | OROMUCOSAL | Status: AC

## 2021-05-19 MED ORDER — AMLODIPINE BESYLATE 5 MG PO TABS
5.0000 mg | ORAL_TABLET | Freq: Every day | ORAL | Status: DC
  Administered 2021-05-19 – 2021-05-20 (×2): 5 mg via ORAL
  Filled 2021-05-19 (×2): qty 1

## 2021-05-19 MED ORDER — OXYCODONE HCL 5 MG PO TABS: 5.0000 mg | ORAL_TABLET | Freq: Once | ORAL | Status: DC | PRN

## 2021-05-19 MED ORDER — DEXAMETHASONE SODIUM PHOSPHATE 10 MG/ML IJ SOLN
8.0000 mg | Freq: Once | INTRAMUSCULAR | Status: AC
  Administered 2021-05-19: 8 mg via INTRAVENOUS

## 2021-05-19 MED ORDER — ZOLPIDEM TARTRATE 5 MG PO TABS
5.0000 mg | ORAL_TABLET | Freq: Every day | ORAL | Status: DC
  Administered 2021-05-19: 5 mg via ORAL
  Filled 2021-05-19: qty 1

## 2021-05-19 MED ORDER — CEFAZOLIN SODIUM-DEXTROSE 2-4 GM/100ML-% IV SOLN
2.0000 g | INTRAVENOUS | Status: AC
  Administered 2021-05-19: 2 g via INTRAVENOUS
  Filled 2021-05-19: qty 100

## 2021-05-19 MED ORDER — FENTANYL CITRATE PF 50 MCG/ML IJ SOSY
50.0000 ug | PREFILLED_SYRINGE | INTRAMUSCULAR | Status: DC
  Administered 2021-05-19: 50 ug via INTRAVENOUS
  Filled 2021-05-19: qty 2

## 2021-05-19 MED ORDER — ONDANSETRON HCL 4 MG PO TABS: 4.0000 mg | ORAL_TABLET | Freq: Four times a day (QID) | ORAL | Status: DC | PRN

## 2021-05-19 MED ORDER — METFORMIN HCL ER 500 MG PO TB24
1000.0000 mg | ORAL_TABLET | Freq: Every day | ORAL | Status: DC
Start: 2021-05-20 — End: 2021-05-20
  Administered 2021-05-20: 1000 mg via ORAL
  Filled 2021-05-19: qty 2

## 2021-05-19 MED ORDER — STERILE WATER FOR IRRIGATION IR SOLN
Status: DC | PRN
  Administered 2021-05-19: 2000 mL

## 2021-05-19 MED ORDER — BUPIVACAINE-EPINEPHRINE (PF) 0.25% -1:200000 IJ SOLN
INTRAMUSCULAR | Status: DC | PRN
  Administered 2021-05-19: 30 mL

## 2021-05-19 MED ORDER — 0.9 % SODIUM CHLORIDE (POUR BTL) OPTIME
TOPICAL | Status: DC | PRN
  Administered 2021-05-19: 1000 mL

## 2021-05-19 MED ORDER — FUROSEMIDE 40 MG PO TABS
40.0000 mg | ORAL_TABLET | ORAL | Status: DC
  Administered 2021-05-20: 40 mg via ORAL
  Filled 2021-05-19: qty 1

## 2021-05-19 MED ORDER — FERROUS SULFATE 325 (65 FE) MG PO TABS
325.0000 mg | ORAL_TABLET | Freq: Three times a day (TID) | ORAL | Status: DC
  Administered 2021-05-20 (×2): 325 mg via ORAL
  Filled 2021-05-19 (×2): qty 1

## 2021-05-19 MED ORDER — GABAPENTIN 300 MG PO CAPS
300.0000 mg | ORAL_CAPSULE | Freq: Three times a day (TID) | ORAL | Status: DC
  Administered 2021-05-19 – 2021-05-20 (×4): 300 mg via ORAL
  Filled 2021-05-19 (×4): qty 1

## 2021-05-19 MED ORDER — TRANEXAMIC ACID-NACL 1000-0.7 MG/100ML-% IV SOLN
1000.0000 mg | Freq: Once | INTRAVENOUS | Status: AC
  Administered 2021-05-19: 1000 mg via INTRAVENOUS
  Filled 2021-05-19: qty 100

## 2021-05-19 MED ORDER — OXYCODONE HCL 5 MG/5ML PO SOLN: 5.0000 mg | Freq: Once | ORAL | Status: DC | PRN

## 2021-05-19 MED ORDER — POLYETHYLENE GLYCOL 3350 17 G PO PACK: 17.0000 g | PACK | Freq: Every day | ORAL | Status: DC | PRN

## 2021-05-19 MED ORDER — LEVOTHYROXINE SODIUM 75 MCG PO TABS
75.0000 ug | ORAL_TABLET | ORAL | Status: DC
  Administered 2021-05-20: 75 ug via ORAL
  Filled 2021-05-19: qty 1

## 2021-05-19 MED ORDER — TRANEXAMIC ACID-NACL 1000-0.7 MG/100ML-% IV SOLN
1000.0000 mg | INTRAVENOUS | Status: AC
  Administered 2021-05-19: 1000 mg via INTRAVENOUS
  Filled 2021-05-19: qty 100

## 2021-05-19 MED ORDER — SODIUM CHLORIDE 0.9 % IR SOLN
Status: DC | PRN
  Administered 2021-05-19: 1000 mL

## 2021-05-19 MED ORDER — METOCLOPRAMIDE HCL 5 MG/ML IJ SOLN: 5.0000 mg | Freq: Three times a day (TID) | INTRAMUSCULAR | Status: DC | PRN

## 2021-05-19 MED ORDER — KETOROLAC TROMETHAMINE 30 MG/ML IJ SOLN
INTRAMUSCULAR | Status: DC | PRN
  Administered 2021-05-19: 30 mg

## 2021-05-19 MED ORDER — ROPIVACAINE HCL 5 MG/ML IJ SOLN
INTRAMUSCULAR | Status: DC | PRN
  Administered 2021-05-19: 20 mL via PERINEURAL

## 2021-05-19 MED ORDER — CEFAZOLIN SODIUM-DEXTROSE 2-4 GM/100ML-% IV SOLN
2.0000 g | Freq: Four times a day (QID) | INTRAVENOUS | Status: AC
  Administered 2021-05-19 (×2): 2 g via INTRAVENOUS
  Filled 2021-05-19 (×2): qty 100

## 2021-05-19 MED ORDER — CHLORHEXIDINE GLUCONATE 0.12 % MT SOLN
15.0000 mL | Freq: Once | OROMUCOSAL | Status: AC
  Administered 2021-05-19: 15 mL via OROMUCOSAL

## 2021-05-19 MED ORDER — METHOCARBAMOL 500 MG PO TABS
500.0000 mg | ORAL_TABLET | Freq: Four times a day (QID) | ORAL | Status: DC | PRN
  Administered 2021-05-19 – 2021-05-20 (×3): 500 mg via ORAL
  Filled 2021-05-19 (×3): qty 1

## 2021-05-19 MED ORDER — HYDROMORPHONE HCL 1 MG/ML IJ SOLN: 0.2500 mg | INTRAMUSCULAR | Status: DC | PRN

## 2021-05-19 MED ORDER — BISACODYL 10 MG RE SUPP: 10.0000 mg | Freq: Every day | RECTAL | Status: DC | PRN

## 2021-05-19 MED ORDER — MIDAZOLAM HCL 2 MG/2ML IJ SOLN
1.0000 mg | INTRAMUSCULAR | Status: DC
  Administered 2021-05-19: 2 mg via INTRAVENOUS
  Filled 2021-05-19: qty 2

## 2021-05-19 MED ORDER — DEXAMETHASONE SODIUM PHOSPHATE 10 MG/ML IJ SOLN
10.0000 mg | Freq: Once | INTRAMUSCULAR | Status: AC
  Administered 2021-05-20: 10 mg via INTRAVENOUS
  Filled 2021-05-19: qty 1

## 2021-05-19 MED ORDER — DEXAMETHASONE SODIUM PHOSPHATE 10 MG/ML IJ SOLN
INTRAMUSCULAR | Status: AC
  Filled 2021-05-19: qty 1

## 2021-05-19 MED ORDER — ASPIRIN 81 MG PO CHEW
81.0000 mg | CHEWABLE_TABLET | Freq: Two times a day (BID) | ORAL | Status: DC
  Administered 2021-05-19 – 2021-05-20 (×2): 81 mg via ORAL
  Filled 2021-05-19 (×2): qty 1

## 2021-05-19 MED ORDER — ONDANSETRON HCL 4 MG/2ML IJ SOLN: 4.0000 mg | Freq: Four times a day (QID) | INTRAMUSCULAR | Status: DC | PRN

## 2021-05-19 MED ORDER — IRBESARTAN 150 MG PO TABS
150.0000 mg | ORAL_TABLET | Freq: Every day | ORAL | Status: DC
  Administered 2021-05-20: 150 mg via ORAL
  Filled 2021-05-19: qty 1

## 2021-05-19 MED ORDER — SODIUM CHLORIDE (PF) 0.9 % IJ SOLN
INTRAMUSCULAR | Status: DC | PRN
  Administered 2021-05-19: 30 mL

## 2021-05-19 MED ORDER — METOCLOPRAMIDE HCL 5 MG PO TABS: 5.0000 mg | ORAL_TABLET | Freq: Three times a day (TID) | ORAL | Status: DC | PRN

## 2021-05-19 MED ORDER — LORATADINE 10 MG PO TABS
10.0000 mg | ORAL_TABLET | Freq: Every day | ORAL | Status: DC
  Administered 2021-05-20: 10 mg via ORAL
  Filled 2021-05-19: qty 1

## 2021-05-19 MED ORDER — LACTATED RINGERS IV SOLN: INTRAVENOUS | Status: DC

## 2021-05-19 MED ORDER — CARVEDILOL 25 MG PO TABS
25.0000 mg | ORAL_TABLET | Freq: Two times a day (BID) | ORAL | Status: DC
  Administered 2021-05-19 – 2021-05-20 (×2): 25 mg via ORAL
  Filled 2021-05-19 (×2): qty 1

## 2021-05-19 MED ORDER — DOCUSATE SODIUM 100 MG PO CAPS
100.0000 mg | ORAL_CAPSULE | Freq: Two times a day (BID) | ORAL | Status: DC
  Administered 2021-05-19 – 2021-05-20 (×2): 100 mg via ORAL
  Filled 2021-05-19 (×2): qty 1

## 2021-05-19 MED ORDER — ACETAMINOPHEN 325 MG PO TABS: 325.0000 mg | ORAL_TABLET | Freq: Four times a day (QID) | ORAL | Status: DC | PRN

## 2021-05-19 MED ORDER — PHENYLEPHRINE 80 MCG/ML (10ML) SYRINGE FOR IV PUSH (FOR BLOOD PRESSURE SUPPORT)
PREFILLED_SYRINGE | INTRAVENOUS | Status: AC
  Filled 2021-05-19: qty 10

## 2021-05-19 MED ORDER — ONDANSETRON HCL 4 MG/2ML IJ SOLN
INTRAMUSCULAR | Status: DC | PRN
  Administered 2021-05-19: 4 mg via INTRAVENOUS

## 2021-05-19 MED ORDER — INSULIN ASPART 100 UNIT/ML IJ SOLN
0.0000 [IU] | Freq: Three times a day (TID) | INTRAMUSCULAR | Status: DC
  Administered 2021-05-19: 8 [IU] via SUBCUTANEOUS
  Administered 2021-05-20: 2 [IU] via SUBCUTANEOUS

## 2021-05-19 MED ORDER — ALBUTEROL SULFATE (2.5 MG/3ML) 0.083% IN NEBU: 2.5000 mg | INHALATION_SOLUTION | Freq: Four times a day (QID) | RESPIRATORY_TRACT | Status: DC | PRN

## 2021-05-19 MED ORDER — PROPOFOL 500 MG/50ML IV EMUL
INTRAVENOUS | Status: DC | PRN
  Administered 2021-05-19: 100 ug/kg/min via INTRAVENOUS

## 2021-05-19 MED ORDER — PROPOFOL 1000 MG/100ML IV EMUL
INTRAVENOUS | Status: AC
  Filled 2021-05-19: qty 100

## 2021-05-19 MED ORDER — PROPOFOL 10 MG/ML IV BOLUS
INTRAVENOUS | Status: DC | PRN
  Administered 2021-05-19: 30 mg via INTRAVENOUS

## 2021-05-19 MED ORDER — METHOCARBAMOL 500 MG IVPB - SIMPLE MED
500.0000 mg | Freq: Four times a day (QID) | INTRAVENOUS | Status: DC | PRN
  Filled 2021-05-19: qty 50

## 2021-05-19 MED ORDER — PROMETHAZINE HCL 25 MG/ML IJ SOLN: 6.2500 mg | INTRAMUSCULAR | Status: DC | PRN

## 2021-05-19 MED ORDER — DIPHENHYDRAMINE HCL 12.5 MG/5ML PO ELIX: 12.5000 mg | ORAL_SOLUTION | ORAL | Status: DC | PRN

## 2021-05-19 MED ORDER — PHENYLEPHRINE 80 MCG/ML (10ML) SYRINGE FOR IV PUSH (FOR BLOOD PRESSURE SUPPORT)
PREFILLED_SYRINGE | INTRAVENOUS | Status: DC | PRN
  Administered 2021-05-19: 80 ug via INTRAVENOUS

## 2021-05-19 MED ORDER — SODIUM CHLORIDE (PF) 0.9 % IJ SOLN
INTRAMUSCULAR | Status: AC
  Filled 2021-05-19: qty 50

## 2021-05-19 SURGICAL SUPPLY — 61 items
ADH SKN CLS APL DERMABOND .7 (GAUZE/BANDAGES/DRESSINGS) ×1
ATTUNE MED ANAT PAT 35 KNEE (Knees) ×1 IMPLANT
ATTUNE PSFEM RTSZ5 NARCEM KNEE (Femur) ×1 IMPLANT
ATTUNE PSRP INSR SZ5 6 KNEE (Insert) ×1 IMPLANT
BAG COUNTER SPONGE SURGICOUNT (BAG) ×1 IMPLANT
BAG SPEC THK2 15X12 ZIP CLS (MISCELLANEOUS)
BAG SPNG CNTER NS LX DISP (BAG) ×1
BAG ZIPLOCK 12X15 (MISCELLANEOUS) IMPLANT
BASE TIBIAL ROT PLAT SZ 5 KNEE (Knees) IMPLANT
BLADE SAW SGTL 11.0X1.19X90.0M (BLADE) ×1 IMPLANT
BLADE SAW SGTL 13.0X1.19X90.0M (BLADE) ×3 IMPLANT
BNDG ELASTIC 6X5.8 VLCR STR LF (GAUZE/BANDAGES/DRESSINGS) ×3 IMPLANT
BOWL SMART MIX CTS (DISPOSABLE) ×3 IMPLANT
BSPLAT TIB 5 CMNT ROT PLAT STR (Knees) ×1 IMPLANT
CEMENT HV SMART SET (Cement) ×1 IMPLANT
CUFF TOURN SGL QUICK 34 (TOURNIQUET CUFF) ×2
CUFF TRNQT CYL 34X4.125X (TOURNIQUET CUFF) ×2 IMPLANT
DERMABOND ADVANCED (GAUZE/BANDAGES/DRESSINGS) ×1
DERMABOND ADVANCED .7 DNX12 (GAUZE/BANDAGES/DRESSINGS) ×2 IMPLANT
DRAPE SHEET LG 3/4 BI-LAMINATE (DRAPES) ×3 IMPLANT
DRAPE U-SHAPE 47X51 STRL (DRAPES) ×3 IMPLANT
DRESSING AQUACEL AG SP 3.5X10 (GAUZE/BANDAGES/DRESSINGS) ×2 IMPLANT
DRSG AQUACEL AG SP 3.5X10 (GAUZE/BANDAGES/DRESSINGS) ×2
DURAPREP 26ML APPLICATOR (WOUND CARE) ×6 IMPLANT
ELECT REM PT RETURN 15FT ADLT (MISCELLANEOUS) ×3 IMPLANT
FACESHIELD WRAPAROUND (MASK) ×8 IMPLANT
FACESHIELD WRAPAROUND OR TEAM (MASK) ×4 IMPLANT
GLOVE BIO SURGEON STRL SZ 6 (GLOVE) ×3 IMPLANT
GLOVE BIO SURGEON STRL SZ7.5 (GLOVE) ×3 IMPLANT
GLOVE BIOGEL PI IND STRL 6.5 (GLOVE) ×2 IMPLANT
GLOVE BIOGEL PI IND STRL 7.5 (GLOVE) ×2 IMPLANT
GLOVE BIOGEL PI INDICATOR 6.5 (GLOVE) ×1
GLOVE BIOGEL PI INDICATOR 7.5 (GLOVE) ×1
GOWN STRL REUS W/ TWL LRG LVL3 (GOWN DISPOSABLE) ×4 IMPLANT
GOWN STRL REUS W/TWL LRG LVL3 (GOWN DISPOSABLE) ×4
HANDPIECE INTERPULSE COAX TIP (DISPOSABLE) ×2
HOLDER FOLEY CATH W/STRAP (MISCELLANEOUS) IMPLANT
KIT TURNOVER KIT A (KITS) ×1 IMPLANT
MANIFOLD NEPTUNE II (INSTRUMENTS) ×3 IMPLANT
NDL SAFETY ECLIPSE 18X1.5 (NEEDLE) IMPLANT
NEEDLE HYPO 18GX1.5 SHARP (NEEDLE)
NS IRRIG 1000ML POUR BTL (IV SOLUTION) ×3 IMPLANT
PACK TOTAL KNEE CUSTOM (KITS) ×3 IMPLANT
PROTECTOR NERVE ULNAR (MISCELLANEOUS) ×3 IMPLANT
SET HNDPC FAN SPRY TIP SCT (DISPOSABLE) ×2 IMPLANT
SET PAD KNEE POSITIONER (MISCELLANEOUS) ×3 IMPLANT
SPIKE FLUID TRANSFER (MISCELLANEOUS) ×4 IMPLANT
SUT MNCRL AB 4-0 PS2 18 (SUTURE) ×3 IMPLANT
SUT STRATAFIX PDS+ 0 24IN (SUTURE) ×3 IMPLANT
SUT VIC AB 1 CT1 36 (SUTURE) ×3 IMPLANT
SUT VIC AB 2-0 CT1 27 (SUTURE) ×4
SUT VIC AB 2-0 CT1 TAPERPNT 27 (SUTURE) ×4 IMPLANT
SYR 3ML LL SCALE MARK (SYRINGE) ×2 IMPLANT
TIBIAL BASE ROT PLAT SZ 5 KNEE (Knees) ×2 IMPLANT
TOWEL GREEN STERILE FF (TOWEL DISPOSABLE) ×3 IMPLANT
TRAY CATH INTERMITTENT SS 16FR (CATHETERS) ×2 IMPLANT
TRAY FOLEY MTR SLVR 16FR STAT (SET/KITS/TRAYS/PACK) ×2 IMPLANT
TRAY FOLEY SLVR 14FR TEMP STAT (SET/KITS/TRAYS/PACK) ×1 IMPLANT
TUBE SUCTION HIGH CAP CLEAR NV (SUCTIONS) ×3 IMPLANT
WATER STERILE IRR 1000ML POUR (IV SOLUTION) ×6 IMPLANT
WRAP KNEE MAXI GEL POST OP (GAUZE/BANDAGES/DRESSINGS) ×3 IMPLANT

## 2021-05-19 NOTE — Anesthesia Procedure Notes (Signed)
Spinal ? ?Patient location during procedure: OR ?End time: 05/19/2021 10:21 AM ?Reason for block: surgical anesthesia ?Staffing ?Performed: resident/CRNA  ?Resident/CRNA: Marijo Conception, CRNA ?Preanesthetic Checklist ?Completed: patient identified, IV checked, site marked, risks and benefits discussed, surgical consent, monitors and equipment checked, pre-op evaluation and timeout performed ?Spinal Block ?Patient position: sitting ?Prep: DuraPrep ?Patient monitoring: heart rate, continuous pulse ox and blood pressure ?Approach: midline ?Location: L3-4 ?Injection technique: single-shot ?Needle ?Needle type: Pencan  ?Needle gauge: 24 G ?Needle length: 9 cm ?Assessment ?Sensory level: T6 ?Events: CSF return ? ? ? ?

## 2021-05-19 NOTE — Op Note (Signed)
NAME:  [name]                  ?   ? MEDICAL RECORD NO.:  [id number]  ?   ?                        FACILITY:  [facility]  ?   ? PHYSICIAN:  Pietro Cassis. Alvan Dame, M.D.  DATE OF BIRTH:  [birth year]  ?   ? DATE OF PROCEDURE:  05/19/2021   ?  ?   ?                             OPERATIVE REPORT  ?   ?   ? PREOPERATIVE DIAGNOSIS:  Right knee osteoarthritis.  ?   ? POSTOPERATIVE DIAGNOSIS:  Right knee osteoarthritis.  ?   ? FINDINGS:  The patient was noted to have complete loss of cartilage and  ? bone-on-bone arthritis with associated osteophytes in the medial and patellofemoral compartments of  ? the knee with a significant synovitis and associated effusion.  The patient had failed months of conservative treatment including medications, injection therapy, activity modification. ?   ? PROCEDURE:  Right total knee replacement.  ?   ? COMPONENTS USED:  DePuy Attune rotating platform posterior stabilized knee  ? system, a size 5N femur, 5 tibia, size 6 mm PS AOX insert, and 35 anatomic patellar  ? button.  ?   ? SURGEON:  Pietro Cassis. Alvan Dame, M.D.  ?   ? ASSISTANT:  Costella Hatcher, PA-C.  ?   ? ANESTHESIA:  Regional and Spinal.  ?   ? SPECIMENS:  None.  ?   ? COMPLICATION:  None.  ?   ? DRAINS:  None. ? ?EBL: <150cc  ?   ? TOURNIQUET TIME:  27 min @225mmHg  ?   ? The patient was stable to the recovery room.  ?   ? INDICATION FOR PROCEDURE:  [name] is a 71 y.o. female patient of  ? mine.  The patient had been seen, evaluated, and treated for months conservatively in the  ? office with medication, activity modification, and injections.  The patient had  ? radiographic changes of bone-on-bone arthritis with endplate sclerosis and osteophytes noted.  Based on the radiographic changes and failed conservative measures, the patient  ? decided to proceed with definitive treatment, total knee replacement.  Risks of infection, DVT, component failure, need for revision surgery, neurovascular injury were reviewed in the office setting.  The  postop course was reviewed stressing the efforts to maximize post-operative satisfaction and function.  Consent was obtained for benefit of pain  ? relief.  ?   ? PROCEDURE IN DETAIL:  The patient was brought to the operative theater.  ? Once adequate anesthesia, preoperative antibiotics, 2 gm of Ancef,1 gm of Tranexamic Acid, and 10 mg of Decadron administered, the patient was positioned supine with a right thigh tourniquet placed.  The  ?right lower extremity was prepped and draped in sterile fashion.  A time-  ? out was performed identifying the patient, planned procedure, and the appropriate extremity.  ?   ? The right lower extremity was placed in the [facility] leg holder.  The leg was  ? exsanguinated, tourniquet elevated to 250 mmHg.  A midline incision was  ? made followed by median parapatellar arthrotomy.  Following initial  ? exposure, attention was first directed to  the patella.  Precut  ? measurement was noted to be 23 mm.  I resected down to 14 mm and used a  ? 35 anatomic patellar button to restore patellar height as well as cover the cut surface.  ?   ? The lug holes were drilled and a metal shim was placed to protect the  ? patella from retractors and saw blade during the procedure.  ?   ? At this point, attention was now directed to the femur.  The femoral  ? canal was opened with a drill, irrigated to try to prevent fat emboli.  An  ? intramedullary rod was passed at 3 degrees valgus, 9 mm of bone was  ? resected off the distal femur.  Following this resection, the tibia was  ? subluxated anteriorly.  Using the extramedullary guide, 2 mm of bone was resected off  ? the proximal medial tibia.  We confirmed the gap would be  ? stable medially and laterally with a size 5 spacer block as well as confirmed that the tibial cut was perpendicular in the coronal plane, checking with an alignment rod.  ?   ? Once this was done, I sized the femur to be a size 5 in the anterior-  ? posterior dimension, chose a  narrow component based on medial and  ? lateral dimension.  The size 5 rotation block was then pinned in  ? position anterior referenced using the C-clamp to set rotation.  The  ? anterior, posterior, and  chamfer cuts were made without difficulty nor  ? notching making certain that I was along the anterior cortex to help  ? with flexion gap stability.  ?   ? The final box cut was made off the lateral aspect of distal femur.  ?   ? At this point, the tibia was sized to be a size 5.  The size 5 tray was  ? then pinned in position through the medial third of the tubercle,  ? drilled, and keel punched.  Trial reduction was now carried with a 5 femur, ? 5 tibia, a size 6 mm PS insert, and the 35 anatomic patella botton.  The knee was brought to full extension with good flexion stability with the patella  ? tracking through the trochlea without application of pressure.  Given  ? all these findings the trial components removed.  Final components were  ? opened and cement was mixed.  The knee was irrigated with normal saline solution and pulse lavage.  The synovial lining was  ? then injected with 30 cc of 0.25% Marcaine with epinephrine, 1 cc of Toradol and 30 cc of NS for a total of 61 cc.  ?   ?Final implants were then cemented onto cleaned and dried cut surfaces of bone with the knee brought to extension with a size 6 mm PS trial insert.  ?   ? Once the cement had fully cured, excess cement was removed  ? throughout the knee.  I confirmed that I was satisfied with the range of  ? motion and stability, and the final size 6 mm PS AOX insert was chosen.  It was  ? placed into the knee.  ? ?During this procedure I removed 2 bony spicules, one off the tibial tubercle and one off the dorsal lateral patella  ?   ? The tourniquet had been let down at 27 minutes.  No significant  ? hemostasis was required.  The extensor mechanism was  then reapproximated using #1 Vicryl and #1 Stratafix sutures with the knee  ? in flexion.  The   ? remaining wound was closed with 2-0 Vicryl and running 4-0 Monocryl.  ? The knee was cleaned, dried, dressed sterilely using Dermabond and  ? Aquacel dressing.  The patient was then  ? brought to recovery room in stable condition, tolerating the procedure  ? well.  ? ?Please note that Physician Assistant, Costella Hatcher, PA-C was present for the entirety of the case, and was utilized for pre-operative positioning, peri-operative retractor management, general facilitation of the procedure and for primary wound closure at the end of the case. ?   ?   ?   ?   ? Pietro Cassis Alvan Dame, M.D.  ? ? ?05/19/2021 10:26 AM  ?

## 2021-05-19 NOTE — Evaluation (Signed)
Physical Therapy Evaluation ?Patient Details ?Name: [name] ?MRN: [id number] ?DOB: [birth year] ?Today's Date: 05/19/2021 ? ?History of Present Illness ? 71 yo female s/P RTKA 05/19/21. PMH: HTN, COPD, sarcoidosis, DM  ?Clinical Impression ? Patient reports pain is mild, feels sleepy. Patient ambulated x 20', began to feel weak so assisted to recliner.  Patient plans Dc home with family. Pt admitted with above diagnosis.  Pt currently with functional limitations due to the deficits listed below (see PT Problem List). Pt will benefit from skilled PT to increase their independence and safety with mobility to allow discharge to the venue listed below.   ? ?   ? ?Recommendations for follow up therapy are one component of a multi-disciplinary discharge planning process, led by the attending physician.  Recommendations may be updated based on patient status, additional functional criteria and insurance authorization. ? ?Follow Up Recommendations Follow physician's recommendations for discharge plan and follow up therapies ? ?  ?Assistance Recommended at Discharge Intermittent Supervision/Assistance  ?Patient can return home with the following ? A little help with walking and/or transfers;Assistance with cooking/housework;Assist for transportation;A little help with bathing/dressing/bathroom;Help with stairs or ramp for entrance ? ?  ?Equipment Recommendations None recommended by PT  ?Recommendations for Other Services ?    ?  ?Functional Status Assessment Patient has had a recent decline in their functional status and demonstrates the ability to make significant improvements in function in a reasonable and predictable amount of time.  ? ?  ?Precautions / Restrictions Precautions ?Precautions: Fall;Knee ?Restrictions ?Weight Bearing Restrictions: No  ? ?  ? ?Mobility ? Bed Mobility ?Overal bed mobility: Needs Assistance ?Bed Mobility: Supine to Sit ?  ?  ?Supine to sit: Min assist, HOB elevated ?  ?  ?General bed mobility  comments: support tight leg ?  ? ?Transfers ?Overall transfer level: Needs assistance ?Equipment used: Rolling walker (2 wheels) ?Transfers: Sit to/from Stand ?Sit to Stand: Min assist ?  ?  ?  ?  ?  ?General transfer comment: cues for ahnd and right leg position ?  ? ?Ambulation/Gait ?Ambulation/Gait assistance: Min assist, +2 safety/equipment ?Gait Distance (Feet): 20 Feet ?Assistive device: Rolling walker (2 wheels) ?Gait Pattern/deviations: Step-through pattern, Step-to pattern, Antalgic ?  ?  ?  ?General Gait Details: patient feeling a little weak so had he r sit down ? ?Stairs ?  ?  ?  ?  ?  ? ?Wheelchair Mobility ?  ? ?Modified Rankin (Stroke Patients Only) ?  ? ?  ? ?Balance Overall balance assessment: Mild deficits observed, not formally tested ?  ?  ?  ?  ?  ?  ?  ?  ?  ?  ?  ?  ?  ?  ?  ?  ?  ?  ?   ? ? ? ?Pertinent Vitals/Pain Pain Assessment ?Pain Assessment: 0-10 ?Pain Score: 3  ?Pain Location: right knee ?Pain Descriptors / Indicators: Aching ?Pain Intervention(s): Monitored during session, Premedicated before session, Ice applied  ? ? ?Home Living Family/patient expects to be discharged to:: Private residence ?Living Arrangements: Spouse/significant other ?Available Help at Discharge: Family;Available 24 hours/day ?Type of Home: House ?Home Access: Stairs to enter ?Entrance Stairs-Rails: None ?Entrance Stairs-Number of Steps: 2 ?  ?Home Layout: One level ?Home Equipment: Conservation officer, nature (2 wheels);Toilet riser ?   ?  ?Prior Function Prior Level of Function : Independent/Modified Independent ?  ?  ?  ?  ?  ?  ?  ?  ?  ? ? ?Hand  Dominance  ?   ? ?  ?Extremity/Trunk Assessment  ? Upper Extremity Assessment ?Upper Extremity Assessment: Overall WFL for tasks assessed ?  ? ?Lower Extremity Assessment ?Lower Extremity Assessment: RLE deficits/detail ?RLE Deficits / Details: able to  SLR, nee flexed to 50* ?  ? ?   ?Communication  ?    ?Cognition Arousal/Alertness: Awake/alert ?Behavior During Therapy: [facility]  for tasks assessed/performed ?Overall Cognitive Status: Within Functional Limits for tasks assessed ?  ?  ?  ?  ?  ?  ?  ?  ?  ?  ?  ?  ?  ?  ?  ?  ?  ?  ?  ? ?  ?General Comments   ? ?  ?Exercises    ? ?Assessment/Plan  ?  ?PT Assessment Patient needs continued PT services  ?PT Problem List Decreased strength;Decreased mobility;Decreased safety awareness;Decreased range of motion;Decreased activity tolerance;Decreased balance;Decreased knowledge of use of DME ? ?   ?  ?PT Treatment Interventions DME instruction;Therapeutic activities;Gait training;Therapeutic exercise;Patient/family education;Stair training;Functional mobility training   ? ?PT Goals (Current goals can be found in the Care Plan section)  ?Acute Rehab PT Goals ?Patient Stated Goal: go home ?PT Goal Formulation: With patient/family ?Time For Goal Achievement: 05/26/21 ?Potential to Achieve Goals: Good ? ?  ?Frequency 7X/week ?  ? ? ?Co-evaluation   ?  ?  ?  ?  ? ? ?  ?AM-PAC PT "6 Clicks" Mobility  ?Outcome Measure Help needed turning from your back to your side while in a flat bed without using bedrails?: A Little ?Help needed moving from lying on your back to sitting on the side of a flat bed without using bedrails?: A Little ?Help needed moving to and from a bed to a chair (including a wheelchair)?: A Little ?Help needed standing up from a chair using your arms (e.g., wheelchair or bedside chair)?: A Little ?Help needed to walk in hospital room?: A Little ?Help needed climbing 3-5 steps with a railing? : A Lot ?6 Click Score: 17 ? ?  ?End of Session Equipment Utilized During Treatment: Gait belt ?Activity Tolerance: Patient tolerated treatment well ?Patient left: in chair;with call bell/phone within reach;with chair alarm set;with family/visitor present ?Nurse Communication: Mobility status ?PT Visit Diagnosis: Unsteadiness on feet (R26.81);Pain ?Pain - Right/Left: Right ?Pain - part of body: Knee ?  ? ?Time: [id number] ?PT Time Calculation (min)  (ACUTE ONLY): 16 min ? ? ?Charges:   PT Evaluation ?$PT Eval Low Complexity: 1 Low ?  ?  ?   ? ? ?Tresa Endo PT ?Acute Rehabilitation Services ?Pager [phone] ?Office [phone] ? ? ?Claretha Cooper ?05/19/2021, 4:16 PM ? ?

## 2021-05-19 NOTE — Anesthesia Procedure Notes (Signed)
Anesthesia Regional Block: Adductor canal block  ? ?Pre-Anesthetic Checklist: , timeout performed,  Correct Patient, Correct Site, Correct Laterality,  Correct Procedure, Correct Position, site marked,  Risks and benefits discussed,  Surgical consent,  Pre-op evaluation,  At surgeon's request and post-op pain management ? ?Laterality: Right ? ?Prep: chloraprep     ?  ?Needles:  ?Injection technique: Single-shot ? ?Needle Type: Stimiplex   ? ? ?Needle Length: 9cm  ?Needle Gauge: 21  ? ? ? ?Additional Needles: ? ? ?Procedures:,,,, ultrasound used (permanent image in chart),,    ?Narrative:  ?Start time: 05/19/2021 9:57 AM ?End time: 05/19/2021 10:02 AM ?Injection made incrementally with aspirations every 5 mL. ? ?Performed by: Personally  ?Anesthesiologist: Lowella Curb, MD ? ? ? ? ?

## 2021-05-19 NOTE — Anesthesia Procedure Notes (Deleted)
Spinal ? ?Patient location during procedure: OR ?Reason for block: surgical anesthesia ?Staffing ?Performed: resident/CRNA  ?Resident/CRNA: Atilano Median, CRNA ?Preanesthetic Checklist ?Completed: patient identified, IV checked, site marked, risks and benefits discussed, surgical consent, monitors and equipment checked, pre-op evaluation and timeout performed ?Spinal Block ?Patient position: sitting ?Prep: DuraPrep ?Patient monitoring: heart rate, continuous pulse ox and blood pressure ?Approach: midline ?Location: L3-4 ?Injection technique: single-shot ?Needle ?Needle type: Pencan  ?Needle gauge: 24 G ?Needle length: 9 cm ?Assessment ?Sensory level: T6 ?Events: CSF return ? ? ? ?

## 2021-05-19 NOTE — Progress Notes (Signed)
Assisted Dr. Miller with right, adductor canal, ultrasound guided block. Side rails up, monitors on throughout procedure. See vital signs in flow sheet. Tolerated Procedure well. ? ?

## 2021-05-19 NOTE — Transfer of Care (Signed)
Immediate Anesthesia Transfer of Care Note ? ?Patient: [name] ? ?Procedure(s) Performed: TOTAL KNEE ARTHROPLASTY (Right: Knee) ? ?Patient Location: PACU ? ?Anesthesia Type:Regional and Spinal ? ?Level of Consciousness: awake, alert  and oriented ? ?Airway & Oxygen Therapy: Patient Spontanous Breathing and Patient connected to face mask oxygen ? ?Post-op Assessment: Report given to RN and Post -op Vital signs reviewed and stable ? ?Post vital signs: Reviewed and stable ? ?Last Vitals:  ?Vitals Value Taken Time  ?BP 137/60 05/19/21 1153  ?Temp    ?Pulse 66 05/19/21 1154  ?Resp 18 05/19/21 1154  ?SpO2 100 % 05/19/21 1154  ?Vitals shown include unvalidated device data. ? ?Last Pain:  ?Vitals:  ? 05/19/21 1000  ?TempSrc:   ?PainSc: Asleep  ?   ? ?Patients Stated Pain Goal: 5 (05/19/21 0800) ? ?Complications: No notable events documented. ?

## 2021-05-19 NOTE — Anesthesia Postprocedure Evaluation (Signed)
Anesthesia Post Note ? ?Patient: Milon Dikes ? ?Procedure(s) Performed: TOTAL KNEE ARTHROPLASTY (Right: Knee) ? ?  ? ?Patient location during evaluation: PACU ?Anesthesia Type: Spinal ?Level of consciousness: awake and alert ?Pain management: pain level controlled ?Vital Signs Assessment: post-procedure vital signs reviewed and stable ?Respiratory status: spontaneous breathing, nonlabored ventilation and respiratory function stable ?Cardiovascular status: blood pressure returned to baseline and stable ?Postop Assessment: no apparent nausea or vomiting ?Anesthetic complications: no ? ? ?No notable events documented. ? ?Last Vitals:  ?Vitals:  ? 05/19/21 1318 05/19/21 1337  ?BP:  131/77  ?Pulse: 67   ?Resp: 17 18  ?Temp:  36.6 ?C  ?SpO2: 98% 99%  ?  ?Last Pain:  ?Vitals:  ? 05/19/21 1337  ?TempSrc: Oral  ?PainSc:   ? ? ?  ?  ?  ?  ?  ?  ? ?Lynda Rainwater ? ? ? ? ?

## 2021-05-19 NOTE — Plan of Care (Signed)
?  Problem: Activity: ?Goal: Risk for activity intolerance will decrease ?Outcome: Progressing ?  ?Problem: Safety: ?Goal: Ability to remain free from injury will improve ?Outcome: Progressing ?  ?Problem: Pain Managment: ?Goal: General experience of comfort will improve ?Outcome: Progressing ?  ?

## 2021-05-19 NOTE — Interval H&P Note (Signed)
History and Physical Interval Note: ? ?05/19/2021 ?9:01 AM ? ?[name]  has presented today for surgery, with the diagnosis of Right knee osteoarthritis.  The various methods of treatment have been discussed with the patient and family. After consideration of risks, benefits and other options for treatment, the patient has consented to  Procedure(s): ?TOTAL KNEE ARTHROPLASTY (Right) as a surgical intervention.  The patient's history has been reviewed, patient examined, no change in status, stable for surgery.  I have reviewed the patient's chart and labs.  Questions were answered to the patient's satisfaction.   ? ? ?Shelda Pal ? ? ?

## 2021-05-19 NOTE — Discharge Instructions (Addendum)
INSTRUCTIONS AFTER JOINT REPLACEMENT  ? ?Use of ativan while taking pain medication can increase sedation/drowsiness. Please minimize this medicine while on pain medication. ? ?Remove items at home which could result in a fall. This includes throw rugs or furniture in walking pathways ?ICE to the affected joint every three hours while awake for 30 minutes at a time, for at least the first 3-5 days, and then as needed for pain and swelling.  Continue to use ice for pain and swelling. You may notice swelling that will progress down to the foot and ankle.  This is normal after surgery.  Elevate your leg when you are not up walking on it.   ?Continue to use the breathing machine you got in the hospital (incentive spirometer) which will help keep your temperature down.  It is common for your temperature to cycle up and down following surgery, especially at night when you are not up moving around and exerting yourself.  The breathing machine keeps your lungs expanded and your temperature down. ? ? ?DIET:  As you were doing prior to hospitalization, we recommend a well-balanced diet. ? ?DRESSING / WOUND CARE / SHOWERING ? ?Keep the surgical dressing until follow up.  The dressing is water proof, so you can shower without any extra covering.  IF THE DRESSING FALLS OFF or the wound gets wet inside, change the dressing with sterile gauze.  Please use good hand washing techniques before changing the dressing.  Do not use any lotions or creams on the incision until instructed by your surgeon.   ? ?ACTIVITY ? ?Increase activity slowly as tolerated, but follow the weight bearing instructions below.   ?No driving for 6 weeks or until further direction given by your physician.  You cannot drive while taking narcotics.  ?No lifting or carrying greater than 10 lbs. until further directed by your surgeon. ?Avoid periods of inactivity such as sitting longer than an hour when not asleep. This helps prevent blood clots.  ?You may return  to work once you are authorized by your doctor.  ? ? ? ?WEIGHT BEARING  ? ?Weight bearing as tolerated with assist device (walker, cane, etc) as directed, use it as long as suggested by your surgeon or therapist, typically at least 4-6 weeks. ? ? ?EXERCISES ? ?Results after joint replacement surgery are often greatly improved when you follow the exercise, range of motion and muscle strengthening exercises prescribed by your doctor. Safety measures are also important to protect the joint from further injury. Any time any of these exercises cause you to have increased pain or swelling, decrease what you are doing until you are comfortable again and then slowly increase them. If you have problems or questions, call your caregiver or physical therapist for advice.  ? ?Rehabilitation is important following a joint replacement. After just a few days of immobilization, the muscles of the leg can become weakened and shrink (atrophy).  These exercises are designed to build up the tone and strength of the thigh and leg muscles and to improve motion. Often times heat used for twenty to thirty minutes before working out will loosen up your tissues and help with improving the range of motion but do not use heat for the first two weeks following surgery (sometimes heat can increase post-operative swelling).  ? ?These exercises can be done on a training (exercise) mat, on the floor, on a table or on a bed. Use whatever works the best and is most comfortable for you.  Use music or television while you are exercising so that the exercises are a pleasant break in your day. This will make your life better with the exercises acting as a break in your routine that you can look forward to.   Perform all exercises about fifteen times, three times per day or as directed.  You should exercise both the operative leg and the other leg as well. ? ?Exercises include: ?  ?Quad Sets - Tighten up the muscle on the front of the thigh (Quad) and  hold for 5-10 seconds.   ?Straight Leg Raises - With your knee straight (if you were given a brace, keep it on), lift the leg to 60 degrees, hold for 3 seconds, and slowly lower the leg.  Perform this exercise against resistance later as your leg gets stronger.  ?Leg Slides: Lying on your back, slowly slide your foot toward your buttocks, bending your knee up off the floor (only go as far as is comfortable). Then slowly slide your foot back down until your leg is flat on the floor again.  ?Angel Wings: Lying on your back spread your legs to the side as far apart as you can without causing discomfort.  ?Hamstring Strength:  Lying on your back, push your heel against the floor with your leg straight by tightening up the muscles of your buttocks.  Repeat, but this time bend your knee to a comfortable angle, and push your heel against the floor.  You may put a pillow under the heel to make it more comfortable if necessary.  ? ?A rehabilitation program following joint replacement surgery can speed recovery and prevent re-injury in the future due to weakened muscles. Contact your doctor or a physical therapist for more information on knee rehabilitation.  ? ? ?CONSTIPATION ? ?Constipation is defined medically as fewer than three stools per week and severe constipation as less than one stool per week.  Even if you have a regular bowel pattern at home, your normal regimen is likely to be disrupted due to multiple reasons following surgery.  Combination of anesthesia, postoperative narcotics, change in appetite and fluid intake all can affect your bowels.  ? ?YOU MUST use at least one of the following options; they are listed in order of increasing strength to get the job done.  They are all available over the counter, and you may need to use some, POSSIBLY even all of these options:   ? ?Drink plenty of fluids (prune juice may be helpful) and high fiber foods ?Colace 100 mg by mouth twice a day  ?Senokot for constipation as  directed and as needed Dulcolax (bisacodyl), take with full glass of water  ?Miralax (polyethylene glycol) once or twice a day as needed. ? ?If you have tried all these things and are unable to have a bowel movement in the first 3-4 days after surgery call either your surgeon or your primary doctor.   ? ?If you experience loose stools or diarrhea, hold the medications until you stool forms back up.  If your symptoms do not get better within 1 week or if they get worse, check with your doctor.  If you experience "the worst abdominal pain ever" or develop nausea or vomiting, please contact the office immediately for further recommendations for treatment. ? ? ?ITCHING:  If you experience itching with your medications, try taking only a single pain pill, or even half a pain pill at a time.  You can also use Benadryl over the counter  for itching or also to help with sleep.  ? ?TED HOSE STOCKINGS:  Use stockings on both legs until for at least 2 weeks or as directed by physician office. They may be removed at night for sleeping. ? ?MEDICATIONS:  See your medication summary on the ?After Visit Summary? that nursing will review with you.  You may have some home medications which will be placed on hold until you complete the course of blood thinner medication.  It is important for you to complete the blood thinner medication as prescribed. ? ?PRECAUTIONS:  If you experience chest pain or shortness of breath - call 911 immediately for transfer to the hospital emergency department.  ? ?If you develop a fever greater that 101 F, purulent drainage from wound, increased redness or drainage from wound, foul odor from the wound/dressing, or calf pain - CONTACT YOUR SURGEON.   ?                                                ?FOLLOW-UP APPOINTMENTS:  If you do not already have a post-op appointment, please call the office for an appointment to be seen by your surgeon.  Guidelines for how soon to be seen are listed in your ?After  Visit Summary?, but are typically between 1-4 weeks after surgery. ? ?OTHER INSTRUCTIONS:  ? ?Knee Replacement:  Do not place pillow under knee, focus on keeping the knee straight while resting. CPM inst

## 2021-05-20 ENCOUNTER — Encounter (HOSPITAL_COMMUNITY): Payer: Self-pay | Admitting: Orthopedic Surgery

## 2021-05-20 DIAGNOSIS — M1711 Unilateral primary osteoarthritis, right knee: Secondary | ICD-10-CM | POA: Diagnosis not present

## 2021-05-20 LAB — GLUCOSE, CAPILLARY
Glucose-Capillary: 139 mg/dL — ABNORMAL HIGH (ref 70–99)
Glucose-Capillary: 127 mg/dL — ABNORMAL HIGH (ref 70–99)

## 2021-05-20 LAB — BASIC METABOLIC PANEL
Anion gap: 3 — ABNORMAL LOW (ref 5–15)
BUN: 18 mg/dL (ref 8–23)
CO2: 24 mmol/L (ref 22–32)
Calcium: 8.9 mg/dL (ref 8.9–10.3)
Chloride: 109 mmol/L (ref 98–111)
Creatinine, Ser: 0.66 mg/dL (ref 0.44–1.00)
GFR, Estimated: >60 mL/min (ref >60)
Glucose, Bld: 170 mg/dL — ABNORMAL HIGH (ref 70–99)
Potassium: 4.9 mmol/L (ref 3.5–5.1)
Sodium: 136 mmol/L (ref 135–145)

## 2021-05-20 LAB — CBC
HCT: 29.7 % — ABNORMAL LOW (ref 36.0–46.0)
Hemoglobin: 9.4 g/dL — ABNORMAL LOW (ref 12.0–15.0)
MCH: 27.4 pg (ref 26.0–34.0)
MCHC: 31.6 g/dL (ref 30.0–36.0)
MCV: 86.6 fL (ref 80.0–100.0)
Platelets: 93 10*3/uL — ABNORMAL LOW (ref 150–400)
RBC: 3.43 MIL/uL — ABNORMAL LOW (ref 3.87–5.11)
RDW: 13.7 % (ref 11.5–15.5)
WBC: 7.1 10*3/uL (ref 4.0–10.5)
nRBC: 0 % (ref 0.0–0.2)

## 2021-05-20 MED ORDER — ASPIRIN 81 MG PO CHEW: 81.0000 mg | CHEWABLE_TABLET | Freq: Two times a day (BID) | ORAL | 0 refills | Status: AC

## 2021-05-20 MED ORDER — POLYETHYLENE GLYCOL 3350 17 G PO PACK: 17.0000 g | PACK | Freq: Every day | ORAL | 0 refills | Status: DC | PRN

## 2021-05-20 MED ORDER — DOCUSATE SODIUM 100 MG PO CAPS: 100.0000 mg | ORAL_CAPSULE | Freq: Two times a day (BID) | ORAL | 0 refills | Status: AC

## 2021-05-20 MED ORDER — METHOCARBAMOL 500 MG PO TABS: 500.0000 mg | ORAL_TABLET | Freq: Four times a day (QID) | ORAL | 0 refills | Status: DC | PRN

## 2021-05-20 NOTE — Progress Notes (Signed)
24 hour chart audit completed 

## 2021-05-20 NOTE — Progress Notes (Signed)
Physical Therapy Treatment ?Patient Details ?Name: [name] ?MRN: [id number] ?DOB: 02/18/1950 ?Today's Date: 05/20/2021 ? ? ?History of Present Illness 71 yo female s/P RTKA 05/19/21. PMH: HTN, COPD, sarcoidosis, DM ? ?  ?PT Comments  ? ? POD # 1 am session ?AxO x 3 very pleasant.  Spouse present during session.  Assisted OOB to amb to bathroom.  General bed mobility comments: demonstarted and instructed how to use a belt to self assist LE.  General transfer comment: 50% VC's on proper hand placement and safety with turns.  Also assisted with a toilet transfer. General Gait Details: only tolerated amb to and from bathroom 11 feet x 2 due to increased c/o fatigue.  "I thought I would do better [name] that". Performed a few TE's followed by ICE. ?Pt will need another PT session to increase her mobility and practice stairs.   ?Recommendations for follow up therapy are one component of a multi-disciplinary discharge planning process, led by the attending physician.  Recommendations may be updated based on patient status, additional functional criteria and insurance authorization. ? ?Follow Up Recommendations ? Follow physician's recommendations for discharge plan and follow up therapies ?  ?  ?Assistance Recommended at Discharge Intermittent Supervision/Assistance  ?Patient can return home with the following A little help with walking and/or transfers;Assistance with cooking/housework;Assist for transportation;A little help with bathing/dressing/bathroom;Help with stairs or ramp for entrance ?  ?Equipment Recommendations ? None recommended by PT  ?  ?Recommendations for Other Services   ? ? ?  ?Precautions / Restrictions Precautions ?Precautions: Fall;Knee ?Precaution Comments: instructed no pillow under knee ?Restrictions ?Weight Bearing Restrictions: No  ?  ? ?Mobility ? Bed Mobility ?Overal bed mobility: Needs Assistance ?Bed Mobility: Supine to Sit ?  ?  ?Supine to sit: Supervision, Min guard ?  ?  ?General bed mobility  comments: demonstarted and instructed how to use a belt to self assist LE ?  ? ?Transfers ?Overall transfer level: Needs assistance ?Equipment used: Rolling walker (2 wheels) ?Transfers: Sit to/from Stand ?Sit to Stand: Supervision, Min guard ?  ?  ?  ?  ?  ?General transfer comment: 50% VC's on proper hand placement and safety with turns.  Also assisted with a toilet transfer. ?  ? ?Ambulation/Gait ?Ambulation/Gait assistance: Supervision, Min guard ?Gait Distance (Feet): 22 Feet (11 feet x 2 to and from bathroom) ?Assistive device: Rolling walker (2 wheels) ?Gait Pattern/deviations: Step-through pattern, Step-to pattern, Antalgic, Decreased stance time - right ?Gait velocity: decreased ?  ?  ?General Gait Details: only tolerated amb to and from bathroom 11 feet x 2 due to increased c/o fatigue.  "I thought I would do better [name] that". ? ? ?Stairs ?  ?  ?  ?  ?  ? ? ?Wheelchair Mobility ?  ? ?Modified Rankin (Stroke Patients Only) ?  ? ? ?  ?Balance   ?  ?  ?  ?  ?  ?  ?  ?  ?  ?  ?  ?  ?  ?  ?  ?  ?  ?  ?  ? ?  ?Cognition Arousal/Alertness: Awake/alert ?Behavior During Therapy: [facility] for tasks assessed/performed ?Overall Cognitive Status: Within Functional Limits for tasks assessed ?  ?  ?  ?  ?  ?  ?  ?  ?  ?  ?  ?  ?  ?  ?  ?  ?General Comments: AxO x 3 very pleasant ?  ?  ? ?  ?Exercises  10 reps AP, knees presses ? ?  ?General Comments   ?  ?  ? ?Pertinent Vitals/Pain Pain Assessment ?Pain Assessment: 0-10 ?Pain Score: 5  ?Pain Location: right knee ?Pain Descriptors / Indicators: Aching, Discomfort, Grimacing, Operative site guarding, Tender ?Pain Intervention(s): Monitored during session, Premedicated before session, Repositioned, Ice applied  ? ? ?Home Living   ?  ?  ?  ?  ?  ?  ?  ?  ?  ?   ?  ?Prior Function    ?  ?  ?   ? ?PT Goals (current goals can now be found in the care plan section) Progress towards PT goals: Progressing toward goals ? ?  ?Frequency ? ? ? 7X/week ? ? ? ?  ?PT Plan Current plan  remains appropriate  ? ? ?Co-evaluation   ?  ?  ?  ?  ? ?  ?AM-PAC PT "6 Clicks" Mobility   ?Outcome Measure ? Help needed turning from your back to your side while in a flat bed without using bedrails?: A Little ?Help needed moving from lying on your back to sitting on the side of a flat bed without using bedrails?: A Little ?Help needed moving to and from a bed to a chair (including a wheelchair)?: A Little ?Help needed standing up from a chair using your arms (e.g., wheelchair or bedside chair)?: A Little ?Help needed to walk in hospital room?: A Little ?Help needed climbing 3-5 steps with a railing? : A Little ?6 Click Score: 18 ? ?  ?End of Session Equipment Utilized During Treatment: Gait belt ?Activity Tolerance: Patient tolerated treatment well ?Patient left: in chair;with call bell/phone within reach;with chair alarm set;with family/visitor present ?Nurse Communication: Mobility status ?PT Visit Diagnosis: Unsteadiness on feet (R26.81);Pain ?Pain - Right/Left: Right ?Pain - part of body: Knee ?  ? ? ?Time: [id number] ?PT Time Calculation (min) (ACUTE ONLY): 28 min ? ?Charges:  $Gait Training: 8-22 mins ?$Therapeutic Activity: 8-22 mins          ?          ?Rica Koyanagi  PTA ?Acute  Rehabilitation Services ?Pager      [phone] ?Office      [phone] ? ?

## 2021-05-20 NOTE — Progress Notes (Signed)
Physical Therapy Treatment ?Patient Details ?Name: [name] ?MRN: [id number] ?DOB: [birth year] ?Today's Date: 05/20/2021 ? ? ?History of Present Illness 71 yo female s/P RTKA 05/19/21. PMH: HTN, COPD, sarcoidosis, DM ? ?  ?PT Comments  ? ? POD # 1 pm session ?General Gait Details: tolerated a functional amb distance of 42 feet with spouse "hands on" assisted using gait belt.  Pt feeling better this afternoon. General stair comments: up forward with walker NO rails step to with Spouse "hands on" assisted and 50% VC's on proper walker placement as well as sequencing.  Perfomed twice. Reviewed HEP handout.  General bed mobility comments: asissted back to bed demonstarted and instructed how to use a one step stool to get onto HIGH bed.  "Up backward" with "good" leg on step and use walker to push up. Addressed all mobility questions, discussed appropriate activity, educated on use of ICE.  Pt ready for D/C to home. ?Pt plans to D/C to home with Spouse and pt has a 9:30 OP PT appt in morning. ?  ?Recommendations for follow up therapy are one component of a multi-disciplinary discharge planning process, led by the attending physician.  Recommendations may be updated based on patient status, additional functional criteria and insurance authorization. ? ?Follow Up Recommendations ? Follow physician's recommendations for discharge plan and follow up therapies ?  ?  ?Assistance Recommended at Discharge Intermittent Supervision/Assistance  ?Patient can return home with the following A little help with walking and/or transfers;Assistance with cooking/housework;Assist for transportation;A little help with bathing/dressing/bathroom;Help with stairs or ramp for entrance ?  ?Equipment Recommendations ? None recommended by PT  ?  ?Recommendations for Other Services   ? ? ?  ?Precautions / Restrictions Precautions ?Precautions: Fall;Knee ?Precaution Comments: instructed no pillow under knee ?Restrictions ?Weight Bearing Restrictions: No   ?  ? ?Mobility ? Bed Mobility ?Overal bed mobility: Needs Assistance ?Bed Mobility: Supine to Sit ?  ?  ?Supine to sit: Supervision, Min guard ?  ?  ?General bed mobility comments: asissted back to bed demonstarted and instructed how to use a one step stoll to get onto HIGH bed.  "Up backward" with "good" leg on step and use walker to push up. ?  ? ?Transfers ?Overall transfer level: Needs assistance ?Equipment used: Rolling walker (2 wheels) ?Transfers: Sit to/from Stand ?Sit to Stand: Supervision, Min guard ?  ?  ?  ?  ?  ?General transfer comment: 25% VC's on proper hand placement and safety with turns. ?  ? ?Ambulation/Gait ?Ambulation/Gait assistance: Supervision ?Gait Distance (Feet): 42 Feet ?Assistive device: Rolling walker (2 wheels) ?Gait Pattern/deviations: Step-through pattern, Step-to pattern, Antalgic, Decreased stance time - right ?Gait velocity: decreased ?  ?  ?General Gait Details: tolerated a functional amb distance of 42 feet with spouse "hands on" assisted using gait belt.  Pt feeling better this afternoon. ? ? ?Stairs ?Stairs: Yes ?Stairs assistance: Min assist ?Stair Management: No rails, Step to pattern, Forwards, With walker ?Number of Stairs: 2 ?General stair comments: up forward with walker NO rails step to with Spouse "hands on" assisted and 50% VC's on proper walker placement as well as sequencing.  Perfomed twice. ? ? ?Wheelchair Mobility ?  ? ?Modified Rankin (Stroke Patients Only) ?  ? ? ?  ?Balance   ?  ?  ?  ?  ?  ?  ?  ?  ?  ?  ?  ?  ?  ?  ?  ?  ?  ?  ?  ? ?  ?  Cognition Arousal/Alertness: Awake/alert ?Behavior During Therapy: [facility] for tasks assessed/performed ?Overall Cognitive Status: Within Functional Limits for tasks assessed ?  ?  ?  ?  ?  ?  ?  ?  ?  ?  ?  ?  ?  ?  ?  ?  ?General Comments: AxO x 3 very pleasant ?  ?  ? ?  ?Exercises   ? ?  ?General Comments   ?  ?  ? ?Pertinent Vitals/Pain Pain Assessment ?Pain Assessment: 0-10 ?Pain Score: 5  ?Pain Location: right knee ?Pain  Descriptors / Indicators: Aching, Discomfort, Grimacing, Operative site guarding, Tender ?Pain Intervention(s): Monitored during session, Premedicated before session, Repositioned, Ice applied  ? ? ?Home Living   ?  ?  ?  ?  ?  ?  ?  ?  ?  ?   ?  ?Prior Function    ?  ?  ?   ? ?PT Goals (current goals can now be found in the care plan section) Progress towards PT goals: Progressing toward goals ? ?  ?Frequency ? ? ? 7X/week ? ? ? ?  ?PT Plan Current plan remains appropriate  ? ? ?Co-evaluation   ?  ?  ?  ?  ? ?  ?AM-PAC PT "6 Clicks" Mobility   ?Outcome Measure ? Help needed turning from your back to your side while in a flat bed without using bedrails?: A Little ?Help needed moving from lying on your back to sitting on the side of a flat bed without using bedrails?: A Little ?Help needed moving to and from a bed to a chair (including a wheelchair)?: A Little ?Help needed standing up from a chair using your arms (e.g., wheelchair or bedside chair)?: A Little ?Help needed to walk in hospital room?: A Little ?Help needed climbing 3-5 steps with a railing? : A Little ?6 Click Score: 18 ? ?  ?End of Session Equipment Utilized During Treatment: Gait belt ?Activity Tolerance: Patient tolerated treatment well ?Patient left: in bed ?Nurse Communication: Mobility status ?PT Visit Diagnosis: Unsteadiness on feet (R26.81);Pain ?Pain - Right/Left: Right ?Pain - part of body: Knee ?  ? ? ?Time: 7564-3329 ?PT Time Calculation (min) (ACUTE ONLY): 28 min ? ?Charges:  $Gait Training: 8-22 mins ?$Therapeutic Activity: 8-22 mins          ?          ?Felecia Shelling  PTA ?Acute  Rehabilitation Services ?Pager      [phone] ?Office      [phone] ? ?

## 2021-05-20 NOTE — TOC Transition Note (Signed)
Transition of Care (TOC) - CM/SW Discharge Note ? ?Patient Details  ?Name: [name] ?MRN: [id number] ?Date of Birth: 1950-06-23 ? ?Transition of Care (TOC) CM/SW Contact:  ?Sherie Don, LCSW ?Phone Number: ?05/20/2021, 9:38 AM ? ?Clinical Narrative: Patient is expected to discharge home after working with PT. CSW met with patient to confirm discharge plan and needs. Patient will discharge home with OPPT at [facility]. Patient will need a youth rolling walker, which [employer] delivered to patient's room. TOC signing off. ? ?Final next level of care: OP Rehab ?Barriers to Discharge: No Barriers Identified ? ?Patient Goals and CMS Choice ?Patient states their goals for this hospitalization and ongoing recovery are:: Discharge home with OPPT at [facility] ?CMS Medicare.gov Compare Post Acute Care list provided to:: Patient ?Choice offered to / list presented to : Patient ? ?Discharge Plan and Services        ?DME Arranged: [employer] youth ?DME Agency: [employer] ?Representative spoke with at DME Agency: Prearranged in orthopedist's office ? ?Readmission Risk Interventions ?   ? View : No data to display.  ?  ?  ?  ? ?

## 2021-05-20 NOTE — Plan of Care (Signed)
Discharge order received, instructions given to patient + spouse, all questions answered. PIV removed. Pt transported to exit via wheelchair.  ? ? ?Problem: Education: ?Goal: Knowledge of General Education information will improve ?Description: Including pain rating scale, medication(s)/side effects and non-pharmacologic comfort measures ?Outcome: Adequate for Discharge ?  ?Problem: Health Behavior/Discharge Planning: ?Goal: Ability to manage health-related needs will improve ?Outcome: Adequate for Discharge ?  ?Problem: Clinical Measurements: ?Goal: Ability to maintain clinical measurements within normal limits will improve ?Outcome: Adequate for Discharge ?Goal: Will remain free from infection ?Outcome: Adequate for Discharge ?Goal: Diagnostic test results will improve ?Outcome: Adequate for Discharge ?Goal: Respiratory complications will improve ?Outcome: Adequate for Discharge ?Goal: Cardiovascular complication will be avoided ?Outcome: Adequate for Discharge ?  ?Problem: Activity: ?Goal: Risk for activity intolerance will decrease ?Outcome: Adequate for Discharge ?  ?Problem: Nutrition: ?Goal: Adequate nutrition will be maintained ?Outcome: Adequate for Discharge ?  ?Problem: Coping: ?Goal: Level of anxiety will decrease ?Outcome: Adequate for Discharge ?  ?Problem: Elimination: ?Goal: Will not experience complications related to bowel motility ?Outcome: Adequate for Discharge ?Goal: Will not experience complications related to urinary retention ?Outcome: Adequate for Discharge ?  ?Problem: Pain Managment: ?Goal: General experience of comfort will improve ?Outcome: Adequate for Discharge ?  ?Problem: Safety: ?Goal: Ability to remain free from injury will improve ?Outcome: Adequate for Discharge ?  ?Problem: Skin Integrity: ?Goal: Risk for impaired skin integrity will decrease ?Outcome: Adequate for Discharge ?  ?

## 2021-05-25 NOTE — Discharge Summary (Signed)
Physician Discharge Summary  ? ?Patient ID: ?Milon Dikes ?MRN: [id number] ?DOB/AGE: 73-Nov-1952 73 y.o. ? ?Admit date: 05/19/2021 ?Discharge date: 05/20/2021 ? ?Primary Diagnosis: Right knee osteoarthritis ? ?Admission Diagnoses:  ?Past Medical History:  ?Diagnosis Date  ? Arthritis   ? Asthma   ? CAD (coronary artery disease)   ? CHF (congestive heart failure) ([place])   ? Complication of anesthesia   ? trouble waking up once  ? COPD (chronic obstructive pulmonary disease) ([place])   ? Diabetes mellitus without complication ([place])   ? Dysrhythmia   ? Heart murmur   ? Hypertension   ? Hypothyroidism   ? Liver disease   ? NAFLD cirrhosis  ? Mild aortic stenosis   ? Pneumonia   ? Sarcoidosis   ? Sarcoidosis of lung ([place])   ? Sarcoidosis of lymph nodes   ? ?Discharge Diagnoses:   ?Principal Problem: ?  S/P total knee arthroplasty, right ? ?Estimated body mass index is 30.18 kg/m? as calculated from the following: ?  Height as of this encounter: 5\' 2"  (1.575 m). ?  Weight as of this encounter: 74.8 kg. ? ?Procedure:  ?Procedure(s) (LRB): ?TOTAL KNEE ARTHROPLASTY (Right)  ? ?Consults: None ? ?HPI: [name] is a 71 y.o. female patient of  ? mine.  The patient had been seen, evaluated, and treated for months conservatively in the  ? office with medication, activity modification, and injections.  The patient had  ? radiographic changes of bone-on-bone arthritis with endplate sclerosis and osteophytes noted.  Based on the radiographic changes and failed conservative measures, the patient  ? decided to proceed with definitive treatment, total knee replacement.  Risks of infection, DVT, component failure, need for revision surgery, neurovascular injury were reviewed in the office setting.  The postop course was reviewed stressing the efforts to maximize post-operative satisfaction and function.  Consent was obtained for benefit of pain  ? relief.  ? ?Laboratory Data: ?Admission on 05/19/2021, Discharged on 05/20/2021  ?Component Date  Value Ref Range Status  ? Glucose-Capillary 05/19/2021 96  70 - 99 mg/dL Final  ? Glucose reference range applies only to samples taken after fasting for at least 8 hours.  ? Glucose-Capillary 05/19/2021 119 (H)  70 - 99 mg/dL Final  ? Glucose reference range applies only to samples taken after fasting for at least 8 hours.  ? Hgb A1c MFr Bld 05/19/2021 5.5  4.8 - 5.6 % Final  ? Comment: (NOTE) ?Pre diabetes:          5.7%-6.4% ? ?Diabetes:              >6.4% ? ?Glycemic control for   <7.0% ?adults with diabetes ?  ? Mean Plasma Glucose 05/19/2021 111.15  mg/dL Final  ? Performed at [facility] [address] [place], [place] [address]  ? WBC 05/20/2021 7.1  4.0 - 10.5 K/uL Final  ? RBC 05/20/2021 3.43 (L)  3.87 - 5.11 MIL/uL Final  ? Hemoglobin 05/20/2021 9.4 (L)  12.0 - 15.0 g/dL Final  ? HCT 05/20/2021 29.7 (L)  36.0 - 46.0 % Final  ? MCV 05/20/2021 86.6  80.0 - 100.0 fL Final  ? MCH 05/20/2021 27.4  26.0 - 34.0 pg Final  ? MCHC 05/20/2021 31.6  30.0 - 36.0 g/dL Final  ? RDW 05/20/2021 13.7  11.5 - 15.5 % Final  ? Platelets 05/20/2021 93 (L)  150 - 400 K/uL Final  ? Comment: SPECIMEN CHECKED FOR CLOTS ?Immature Platelet Fraction may  be ?clinically indicated, consider ?ordering this additional test ?[id number] ?REPEATED TO VERIFY ?PLATELET COUNT CONFIRMED BY SMEAR ?  ? nRBC 05/20/2021 0.0  0.0 - 0.2 % Final  ? Performed at [facility], [place] [address] [place], [place] [address]  ? Sodium 05/20/2021 136  135 - 145 mmol/L Final  ? Potassium 05/20/2021 4.9  3.5 - 5.1 mmol/L Final  ? Chloride 05/20/2021 109  98 - 111 mmol/L Final  ? CO2 05/20/2021 24  22 - 32 mmol/L Final  ? Glucose, Bld 05/20/2021 170 (H)  70 - 99 mg/dL Final  ? Glucose reference range applies only to samples taken after fasting for at least 8 hours.  ? BUN 05/20/2021 18  8 - 23 mg/dL Final  ? Creatinine, Ser 05/20/2021 0.66  0.44 - 1.00 mg/dL Final  ? Calcium 05/20/2021 8.9  8.9 - 10.3 mg/dL Final  ? GFR, Estimated  05/20/2021 >60  >60 mL/min Final  ? Comment: (NOTE) ?Calculated using the CKD-EPI Creatinine Equation (2021) ?  ? Anion gap 05/20/2021 3 (L)  5 - 15 Final  ? Performed at [facility], [place] [address] [place], [place] [address]  ? Glucose-Capillary 05/19/2021 271 (H)  70 - 99 mg/dL Final  ? Glucose reference range applies only to samples taken after fasting for at least 8 hours.  ? Glucose-Capillary 05/19/2021 205 (H)  70 - 99 mg/dL Final  ? Glucose reference range applies only to samples taken after fasting for at least 8 hours.  ? Glucose-Capillary 05/20/2021 139 (H)  70 - 99 mg/dL Final  ? Glucose reference range applies only to samples taken after fasting for at least 8 hours.  ? Glucose-Capillary 05/20/2021 127 (H)  70 - 99 mg/dL Final  ? Glucose reference range applies only to samples taken after fasting for at least 8 hours.  ?Hospital Outpatient Visit on 05/14/2021  ?Component Date Value Ref Range Status  ? MRSA, PCR 05/14/2021 NEGATIVE  NEGATIVE Final  ? Staphylococcus aureus 05/14/2021 NEGATIVE  NEGATIVE Final  ? Comment: (NOTE) ?The Xpert SA Assay (FDA approved for NASAL specimens in patients 66 ?years of age and older), is one component of a comprehensive ?surveillance program. It is not intended to diagnose infection nor to ?guide or monitor treatment. ?Performed at [facility], [place] [address] ?[place], [place] [address] ?  ? Glucose-Capillary 05/14/2021 103 (H)  70 - 99 mg/dL Final  ? Glucose reference range applies only to samples taken after fasting for at least 8 hours.  ?Admission on 04/29/2021, Discharged on 04/30/2021  ?Component Date Value Ref Range Status  ? Sodium 04/29/2021 134 (L)  135 - 145 mmol/L Final  ? Potassium 04/29/2021 3.9  3.5 - 5.1 mmol/L Final  ? Chloride 04/29/2021 107  98 - 111 mmol/L Final  ? CO2 04/29/2021 20 (L)  22 - 32 mmol/L Final  ? Glucose, Bld 04/29/2021 111 (H)  70 - 99 mg/dL Final  ? Glucose reference range applies only to  samples taken after fasting for at least 8 hours.  ? BUN 04/29/2021 16  8 - 23 mg/dL Final  ? Creatinine, Ser 04/29/2021 0.76  0.44 - 1.00 mg/dL Final  ? Calcium 04/29/2021 9.2  8.9 - 10.3 mg/dL Final  ? GFR, Estimated 04/29/2021 >60  >60 mL/min Final  ? Comment: (NOTE) ?Calculated using the CKD-EPI Creatinine Equation (2021) ?  ? Anion gap 04/29/2021 7  5 - 15 Final  ? Performed at [facility],  [place] [address] [place], [place] [address]  ? WBC 04/29/2021 4.0  4.0 - 10.5 K/uL Final  ? RBC 04/29/2021 4.36  3.87 - 5.11 MIL/uL Final  ? Hemoglobin 04/29/2021 12.0  12.0 - 15.0 g/dL Final  ? HCT 04/29/2021 37.6  36.0 - 46.0 % Final  ? MCV 04/29/2021 86.2  80.0 - 100.0 fL Final  ? MCH 04/29/2021 27.5  26.0 - 34.0 pg Final  ? MCHC 04/29/2021 31.9  30.0 - 36.0 g/dL Final  ? RDW 04/29/2021 14.1  11.5 - 15.5 % Final  ? Platelets 04/29/2021 100 (L)  150 - 400 K/uL Final  ? Comment: SPECIMEN CHECKED FOR CLOTS ?Immature Platelet Fraction may be ?clinically indicated, consider ?ordering this additional test ?[id number] ?REPEATED TO VERIFY ?PLATELET COUNT CONFIRMED BY SMEAR ?  ? nRBC 04/29/2021 0.0  0.0 - 0.2 % Final  ? Performed at [facility], [place] [address], [place], [place] [address]  ? Troponin I (High Sensitivity) 04/29/2021 9  <18 ng/L Final  ? Comment: (NOTE) ?Elevated high sensitivity troponin I (hsTnI) values and significant  ?changes across serial measurements may suggest ACS but many other  ?chronic and acute conditions are known to elevate hsTnI results.  ?Refer to the "Links" section for chest pain algorithms and additional  ?guidance. ?Performed at [facility], [place] [address] ?[place], [place] [address] ?  ?Admission on 04/17/2021, Discharged on 04/17/2021  ?Component Date Value Ref Range Status  ? Glucose-Capillary 04/17/2021 102 (H)  70 - 99 mg/dL Final  ? Glucose reference range applies only to samples taken after fasting for at least 8 hours.  ?   ? ?X-Rays:DG Ribs Unilateral W/Chest Left ? ?Result Date: 04/29/2021 ?CLINICAL DATA:  Rib pain. Sudden onset of left-sided rib pain today. Felt pop. EXAM: LEFT RIBS AND CHEST - 3+ VIEW COMPARISON:  Chest radiograph 09/05/

## 2021-06-01 ENCOUNTER — Other Ambulatory Visit (HOSPITAL_COMMUNITY): Payer: Medicare Other

## 2021-06-10 ENCOUNTER — Other Ambulatory Visit: Payer: Self-pay | Admitting: Gastroenterology

## 2021-06-10 ENCOUNTER — Other Ambulatory Visit (HOSPITAL_BASED_OUTPATIENT_CLINIC_OR_DEPARTMENT_OTHER): Payer: Self-pay | Admitting: Gastroenterology

## 2021-06-10 ENCOUNTER — Ambulatory Visit (HOSPITAL_BASED_OUTPATIENT_CLINIC_OR_DEPARTMENT_OTHER)
Admission: RE | Admit: 2021-06-10 | Discharge: 2021-06-10 | Disposition: A | Discharge status: Home / Self Care | Payer: Medicare Other | Source: Ambulatory Visit | Attending: Gastroenterology | Admitting: Gastroenterology

## 2021-06-10 DIAGNOSIS — R1084 Generalized abdominal pain: Secondary | ICD-10-CM | POA: Diagnosis present

## 2021-06-10 MED ORDER — IOHEXOL 350 MG/ML SOLN
60.0000 mL | Freq: Once | INTRAVENOUS | Status: AC | PRN
  Administered 2021-06-10: 60 mL via INTRAVENOUS

## 2021-06-23 ENCOUNTER — Encounter (HOSPITAL_BASED_OUTPATIENT_CLINIC_OR_DEPARTMENT_OTHER): Payer: Self-pay

## 2021-06-23 NOTE — Progress Notes (Incomplete)
Cardiology Office Note:    Date:  06/23/2021   ID:  [name], DOB 07-06-1950, MRN [id number]  PCP:  Jilda Panda, MD  Cardiologist:  Buford Dresser, MD PhD  Referring MD: Jilda Panda, MD   CC: follow-up  History of Present Illness:    [name] is a 71 y.o. female with a hx of type II diabetes, hypertension, sarcoidosis who is seen for follow-up. She was initially seen 02/08/18 for the evaluation and management of chest pain.  She was seen in the ER on 04/29/21 for chest wall pain. Noted acute rib pain after feeling a pop, worse with movement/breathing, tender on palpation. This is now nearly resolved.  At her last appointment  Today: Here for preoperative evaluation. Scheduled for total knee arthroplasty on 06/09/21 with Dr. Alvan Dame. Hoping to move it up to 05/19/21. Uncertain what the type of anesthesia will be used.  History of anesthesia complications: no significant issues (once was a little slow to wake up but otherwise did fine). Current symptoms: feels great, rib pain improving as below. Functional capacity: Able to walk without chest pain, limited by knee pain. Only has two steps in the house. Had only trivial mild nonobstructive disease about 7 mos ago by cath.  Did an ANA due to joint pain, returned as positive. Concerned about possible drug induced lupus.  Doing well on carvedilol, feels much better. Blood pressure typically [id number] systolic since the change.  Today: ***  She denies any palpitations, chest pain, shortness of breath, or peripheral edema. No lightheadedness, headaches, syncope, orthopnea, or PND.  (+)  Past Medical History:  Diagnosis Date   Arthritis    Asthma    CAD (coronary artery disease)    CHF (congestive heart failure) (HCC)    Complication of anesthesia    trouble waking up once   COPD (chronic obstructive pulmonary disease) ([place])    Diabetes mellitus without complication ([place])    Dysrhythmia    Heart murmur    Hypertension     Hypothyroidism    Liver disease    NAFLD cirrhosis   Mild aortic stenosis    Pneumonia    Sarcoidosis    Sarcoidosis of lung ([place])    Sarcoidosis of lymph nodes     Past Surgical History:  Procedure Laterality Date   ABDOMINAL HYSTERECTOMY     ABDOMINAL SURGERY     CHOLECYSTECTOMY     ESOPHAGOGASTRODUODENOSCOPY (EGD) WITH PROPOFOL N/A 04/17/2021   Procedure: ESOPHAGOGASTRODUODENOSCOPY (EGD) WITH PROPOFOL;  Surgeon: Carol Ada, MD;  Location: [facility];  Service: Gastroenterology;  Laterality: N/A;   HERNIA REPAIR     LEFT HEART CATH AND CORONARY ANGIOGRAPHY N/A 09/23/2020   Procedure: LEFT HEART CATH AND CORONARY ANGIOGRAPHY;  Surgeon: Troy Sine, MD;  Location: [facility];  Service: Cardiovascular;  Laterality: N/A;   nissenfundiplication     TOTAL KNEE ARTHROPLASTY Right 05/19/2021   Procedure: TOTAL KNEE ARTHROPLASTY;  Surgeon: Paralee Cancel, MD;  Location: [facility];  Service: Orthopedics;  Laterality: Right;    Current Medications: Current Outpatient Medications on File Prior to Visit  Medication Sig   acyclovir (ZOVIRAX) 400 MG tablet Take 400 mg by mouth every 8 (eight) hours as needed. Fever blisters   albuterol (VENTOLIN HFA) 108 (90 Base) MCG/ACT inhaler Inhale 2 puffs into the lungs every 6 (six) hours as needed.   amLODipine (NORVASC) 5 MG tablet Take 1 tablet (5 mg total) by mouth daily.   calcium-vitamin D (OSCAL WITH  D) 500-200 MG-UNIT tablet Take 1 tablet by mouth daily with breakfast.   carvedilol (COREG) 25 MG tablet Take 1 tablet (25 mg total) by mouth 2 (two) times daily with a meal. (Patient not taking: Reported on 05/19/2021)   cetirizine (ZYRTEC) 10 MG tablet Take 10 mg by mouth daily.   docusate sodium (COLACE) 100 MG capsule Take 1 capsule (100 mg total) by mouth 2 (two) times daily.   furosemide (LASIX) 20 MG tablet Take 20-40 mg by mouth See admin instructions. Taking 20 mg every Sun., Tues., Thurs., and Sat. Taking 40 mg all other days.    gabapentin (NEURONTIN) 300 MG capsule Take 300 mg by mouth 3 (three) times daily.   irbesartan (AVAPRO) 150 MG tablet Take 1 tablet (150 mg total) by mouth daily.   levothyroxine (SYNTHROID, LEVOTHROID) 150 MCG tablet Take 75-150 mcg by mouth See admin instructions. Pt alternates between 150 mcg and 75 mcg each day with before breakfast   metFORMIN (GLUCOPHAGE-XR) 500 MG 24 hr tablet Take 2 tablets (1,000 mg total) by mouth 2 (two) times daily. (Patient taking differently: Take 1,000 mg by mouth daily with breakfast.)   methocarbamol (ROBAXIN) 500 MG tablet Take 1 tablet (500 mg total) by mouth every 6 (six) hours as needed for muscle spasms.   polyethylene glycol (MIRALAX / GLYCOLAX) 17 g packet Take 17 g by mouth daily as needed for mild constipation.   rosuvastatin (CRESTOR) 10 MG tablet Take 10 mg by mouth daily.   zolpidem (AMBIEN) 10 MG tablet Take 10 mg by mouth at bedtime.   No current facility-administered medications on file prior to visit.     Allergies:   Sulfa antibiotics, Atorvastatin, and Amoxicillin   Social History   Socioeconomic History   Marital status: Married    Spouse name: Not on file   Number of children: Not on file   Years of education: Not on file   Highest education level: Not on file  Occupational History   Not on file  Tobacco Use   Smoking status: Never   Smokeless tobacco: Never  Vaping Use   Vaping Use: Never used  Substance and Sexual Activity   Alcohol use: No   Drug use: No   Sexual activity: Not Currently  Other Topics Concern   Not on file  Social History Narrative   Not on file   Social Determinants of Health   Financial Resource Strain: Not on file  Food Insecurity: Not on file  Transportation Needs: Not on file  Physical Activity: Not on file  Stress: Not on file  Social Connections: Not on file   Former nurse, 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  Family History: The patient's family history includes  Breast cancer (age of onset: 30) in her mother; Heart attack in her brother, brother, father, and mother; Heart failure in her father; Hypertension in her father.  ROS:   Please see the history of present illness.  All other systems are reviewed and negative.    EKGs/Labs/Other Studies Reviewed:    The following studies were reviewed today:  CT Chest  03/06/2021: FINDINGS: Cardiovascular: Upper normal heart size. No pericardial effusion mild atherosclerosis of the thoracic aorta. No aortic aneurysm. Mild prominence of the central pulmonary artery at 3.2 cm.   Mediastinum/Nodes: Mediastinal adenopathy persists. Index right lower paratracheal node measures 14 mm short axis, series 2, image 45, previously 12 mm. Anterior precarinal node measures 15 mm, series 2, image  56, previously 16 mm. Increased number of small prevascular and highest mediastinal nodes. Limited assessment for hilar adenopathy in the absence of IV contrast. Diffusely enlarged thyroid gland, right thyroid calcification, unchanged. This has been evaluated on previous imaging. (ref: J Am Coll Radiol. 2015 Feb;12(2): 143-50).Thyroid ultrasound 12/20/2013 Small hiatal hernia, post Nissen fundoplication.   Lungs/Pleura: There is improvement in the anterior right upper lobe ground-glass opacity from prior exam. Mild faint residual perifissural ground-glass persists, for example series 8 images 68 and 70. This persistent ground-glass is ill-defined without solid component. Improvement in the left paramediastinal upper lobe ground-glass opacity, mild residual persisting, for example series 8, image 65. This residual ground-glass opacity is ill-defined without solid component. There is also improvement in the left lower lobe ground-glass opacity with mild ill-defined residual, series 8, image 99. No new or progressive ground-glass opacities or focal airspace disease. No solid pulmonary nodules. No pleural fluid.   Upper  Abdomen: Cirrhotic hepatic morphology again seen. Splenomegaly partially included. No acute upper abdominal findings.   Musculoskeletal: There are no acute or suspicious osseous abnormalities.   IMPRESSION: 1. Improvement in bilateral ground-glass opacities from prior exam, suggesting infectious or inflammatory etiology. Mild residual ill-defined ground-glass may represent scarring. No new or progressive ground-glass opacities or focal airspace disease. No solid pulmonary nodules. 2. Essentially unchanged mediastinal adenopathy, likely reactive. 3. Cirrhosis. 4. Mild prominence of the central pulmonary artery suggesting pulmonary arterial hypertension.   Aortic Atherosclerosis (ICD10-I70.0).  Right LE Venous Doppler 10/09/2020: Summary:  RIGHT:  - No evidence of deep vein thrombosis in the lower extremity. No indirect  evidence of obstruction proximal to the inguinal ligament.  - No cystic structure found in the popliteal fossa.     LEFT:  - No evidence of common femoral vein obstruction.   Cardiac MRI 09/25/20 IMPRESSION: Asymmetric septal hypertrophy without other evidence of infiltrative disease and systolic anterior motion of the mitral valve: This can be seen in hypertropic obstructive cardiomyopathy.   Moderate mitral regurgitation.  L Heart Cath 09/23/20   Prox Cx lesion is 20% stenosed.   Mild nonobstructive CAD with tortuous vessels and smooth 20% narrowing in the proximal circumflex far artery.  The LAD appears angiographically normal.  The right coronary artery is a dominant vessel with superior takeoff.   Mild LV to aorta gradient on pullback with a peak to peak gradient at approximately 16 mmHg.   RECOMMENDATION: Medical therapy.  Echo 09/23/20  1. Left ventricular ejection fraction, by estimation, is 65 to 70%. The  left ventricle has normal function. The left ventricle has no regional  wall motion abnormalities. Left ventricular diastolic parameters are   consistent with Grade II diastolic  dysfunction (pseudonormalization).   2. Right ventricular systolic function is normal. The right ventricular  size is normal.   3. Left atrial size was mildly dilated.   4. There is systolic anterior motion of the MV leaflet. . The mitral  valve is normal in structure. Moderate mitral valve regurgitation.   5. The aortic valve is normal in structure. Aortic valve regurgitation is  not visualized.   Monitor 02/23/18 3 days of data recorded on Zio monitor. Patient had a min HR of 58 bpm, max HR of 129 bpm, and avg HR of 75 bpm. Predominant underlying rhythm was Sinus Rhythm. No VT, SVT, atrial fibrillation, high degree block, or pauses noted. Isolated atrial ectopy was rare (<1%). Overall PVC burden was 4.8%, with rare couplets. Longest ventricular bigeminy episode was 11 seconds, and longest  ventricular trigeminy episode was 1 minute 50 seconds. There were no patient triggered events.   Lexiscan 02/23/18 Nuclear stress EF: 63%. The study is normal. This is a low risk study. The left ventricular ejection fraction is normal (55-65%).   Normal stress nuclear study with no ischemia or infarction; EF 63 with normal wall motion.   Echo 2014 Left ventricle: The cavity size was normal. Wall thickness   was increased in a pattern of moderate LVH. Systolic   function was vigorous. The estimated ejection fraction was   in the range of 65% to 70%. Wall motion was normal; there   were no regional wall motion abnormalities. - Mitral valve: Mild regurgitation.  EKG:   EKG is personally reviewed. 06/25/2021:  EKG was not ordered. 05/08/21: NSR at 70 bpm 02/08/18: normal sinus rhythm, borderline LVH criteria  Recent Labs: 10/23/2020: BNP 56.2 02/12/2021: ALT 32 05/20/2021: BUN 18; Creatinine, Ser 0.66; Hemoglobin 9.4; Platelets 93; Potassium 4.9; Sodium 136   Recent Lipid Panel    Component Value Date/Time   CHOL 116 09/23/2020 0210   TRIG 24 09/23/2020 0210   HDL  48 09/23/2020 0210   CHOLHDL 2.4 09/23/2020 0210   VLDL 5 09/23/2020 0210   LDLCALC 63 09/23/2020 0210    Physical Exam:    VS:  There were no vitals taken for this visit.    Wt Readings from Last 3 Encounters:  05/19/21 165 lb (74.8 kg)  05/14/21 165 lb (74.8 kg)  05/08/21 167 lb 9.6 oz (76 kg)     GEN: Well nourished, well developed in no acute distress HEENT: Normal NECK: No JVD; No carotid bruits LYMPHATICS: No lymphadenopathy CARDIAC: regular rhythm, normal S1 and S2, no rubs, gallops. ***2/6 holosytolic murmur, does not change with valsalva. Radial and DP pulses 2+ bilaterally. RESPIRATORY:  Clear to auscultation without rales, wheezing or rhonchi  ABDOMEN: Soft, non-tender, non-distended MUSCULOSKELETAL:  No edema; No deformity  SKIN: Warm and dry NEUROLOGIC:  Alert and oriented x 3 PSYCHIATRIC:  Normal affect   ASSESSMENT:    No diagnosis found.    PLAN:    Preoperative cardiovascular exam Based on available date, patient's RCRI score = 1, which carries a 0.9% 30-day risk of death, MI, or cardiac arrest.  The patient is not currently having active cardiac symptoms, and she has recently had a full cardiovascular evaluation in the last year without obstructive CAD.  According to ACC/AHA Guidelines, no further testing is needed.  Proceed with surgery at acceptable risk.  Our service is available as needed in the peri-operative period.   Ok to hold aspirin perioperatively.  Nonobstructive CAD by cath Type II diabetes -no angina -continue aspirin, rosuvastatin -on semaglutide (GLP1RA)  Mild aortic stenosis Moderate mitral regurgitation, systolic anterior motion of the mitral valve with asymmetric septal hypertrophy Hypertension -murmur today did not change with valsalva -blood pressure above goal today -heart rate is 70s-80s -given SAM/LVOT flow, increased carvedilol, now doing great without issues. -continue furosemide, irbesartan. Avoid  dehydration/hyperdynamic LV given septal hypertrophy/SAM -no syncope -home blood pressure monitoring reviewed -stopping hydralazine given abnormal ANA. Tolerated amlodipine 10 mg before but had leg swelling. Will stop hydralazine  and start 5 mg amlodipine today. Reviewed to watch leg swelling.  Plan for follow up: ***3 months or sooner based as needed  Medication Adjustments/Labs and Tests Ordered: Current medicines are reviewed at length with the patient today.  Concerns regarding medicines are outlined above.   No orders of the defined types were placed  in this encounter.  No orders of the defined types were placed in this encounter.  There are no Patient Instructions on file for this visit.  I,Mathew Stumpf,acting as a Education administrator for [employer], MD.,have documented all relevant documentation on the behalf of Buford Dresser, MD,as directed by  Buford Dresser, MD while in the presence of Buford Dresser, MD.  ***   Signed, Buford Dresser, MD PhD 06/23/2021 12:53 PM    [place]

## 2021-06-25 ENCOUNTER — Ambulatory Visit (HOSPITAL_BASED_OUTPATIENT_CLINIC_OR_DEPARTMENT_OTHER): Payer: Medicare Other | Admitting: Cardiology

## 2021-09-25 ENCOUNTER — Emergency Department (HOSPITAL_COMMUNITY): Payer: Medicare Other

## 2021-09-25 ENCOUNTER — Other Ambulatory Visit: Payer: Self-pay | Admitting: Internal Medicine

## 2021-09-25 ENCOUNTER — Other Ambulatory Visit: Payer: Self-pay

## 2021-09-25 ENCOUNTER — Emergency Department (HOSPITAL_COMMUNITY)
Admission: EM | Admit: 2021-09-25 | Discharge: 2021-09-25 | Disposition: A | Discharge status: Home / Self Care | Payer: Medicare Other | Attending: Emergency Medicine | Admitting: Emergency Medicine

## 2021-09-25 ENCOUNTER — Encounter (HOSPITAL_COMMUNITY): Payer: Self-pay

## 2021-09-25 DIAGNOSIS — K7460 Unspecified cirrhosis of liver: Secondary | ICD-10-CM | POA: Diagnosis not present

## 2021-09-25 DIAGNOSIS — R7989 Other specified abnormal findings of blood chemistry: Secondary | ICD-10-CM

## 2021-09-25 DIAGNOSIS — R079 Chest pain, unspecified: Secondary | ICD-10-CM

## 2021-09-25 DIAGNOSIS — J90 Pleural effusion, not elsewhere classified: Secondary | ICD-10-CM | POA: Diagnosis not present

## 2021-09-25 DIAGNOSIS — E119 Type 2 diabetes mellitus without complications: Secondary | ICD-10-CM | POA: Diagnosis not present

## 2021-09-25 DIAGNOSIS — R109 Unspecified abdominal pain: Secondary | ICD-10-CM

## 2021-09-25 DIAGNOSIS — Z7984 Long term (current) use of oral hypoglycemic drugs: Secondary | ICD-10-CM | POA: Diagnosis not present

## 2021-09-25 DIAGNOSIS — E039 Hypothyroidism, unspecified: Secondary | ICD-10-CM | POA: Insufficient documentation

## 2021-09-25 DIAGNOSIS — Z79899 Other long term (current) drug therapy: Secondary | ICD-10-CM | POA: Insufficient documentation

## 2021-09-25 DIAGNOSIS — R0789 Other chest pain: Secondary | ICD-10-CM | POA: Diagnosis present

## 2021-09-25 DIAGNOSIS — R791 Abnormal coagulation profile: Secondary | ICD-10-CM | POA: Diagnosis not present

## 2021-09-25 DIAGNOSIS — I10 Essential (primary) hypertension: Secondary | ICD-10-CM | POA: Diagnosis not present

## 2021-09-25 LAB — BASIC METABOLIC PANEL
Anion gap: 7 (ref 5–15)
BUN: 14 mg/dL (ref 8–23)
CO2: 29 mmol/L (ref 22–32)
Calcium: 9.4 mg/dL (ref 8.9–10.3)
Chloride: 104 mmol/L (ref 98–111)
Creatinine, Ser: 0.78 mg/dL (ref 0.44–1.00)
GFR, Estimated: >60 mL/min (ref >60)
Glucose, Bld: 152 mg/dL — ABNORMAL HIGH (ref 70–99)
Potassium: 3.6 mmol/L (ref 3.5–5.1)
Sodium: 140 mmol/L (ref 135–145)

## 2021-09-25 LAB — TROPONIN I (HIGH SENSITIVITY)
Troponin I (High Sensitivity): 10 ng/L (ref <18)
Troponin I (High Sensitivity): 14 ng/L (ref <18)

## 2021-09-25 LAB — LIPASE, BLOOD: Lipase: 43 U/L (ref 11–51)

## 2021-09-25 LAB — CBC
HCT: 34.8 % — ABNORMAL LOW (ref 36.0–46.0)
Hemoglobin: 11 g/dL — ABNORMAL LOW (ref 12.0–15.0)
MCH: 26.9 pg (ref 26.0–34.0)
MCHC: 31.6 g/dL (ref 30.0–36.0)
MCV: 85.1 fL (ref 80.0–100.0)
Platelets: 92 10*3/uL — ABNORMAL LOW (ref 150–400)
RBC: 4.09 MIL/uL (ref 3.87–5.11)
RDW: 15.6 % — ABNORMAL HIGH (ref 11.5–15.5)
WBC: 3.9 10*3/uL — ABNORMAL LOW (ref 4.0–10.5)
nRBC: 0 % (ref 0.0–0.2)

## 2021-09-25 LAB — BRAIN NATRIURETIC PEPTIDE: B Natriuretic Peptide: 68.2 pg/mL (ref 0.0–100.0)

## 2021-09-25 LAB — HEPATIC FUNCTION PANEL
ALT: 35 U/L (ref 0–44)
AST: 39 U/L (ref 15–41)
Albumin: 3.5 g/dL (ref 3.5–5.0)
Alkaline Phosphatase: 112 U/L (ref 38–126)
Bilirubin, Direct: 0.2 mg/dL (ref 0.0–0.2)
Indirect Bilirubin: 0.6 mg/dL (ref 0.3–0.9)
Total Bilirubin: 0.8 mg/dL (ref 0.3–1.2)
Total Protein: 6.9 g/dL (ref 6.5–8.1)

## 2021-09-25 LAB — D-DIMER, QUANTITATIVE: D-Dimer, Quant: 1.09 ug/mL-FEU — ABNORMAL HIGH (ref 0.00–0.50)

## 2021-09-25 MED ORDER — KETOROLAC TROMETHAMINE 30 MG/ML IJ SOLN
30.0000 mg | Freq: Once | INTRAMUSCULAR | Status: AC
  Administered 2021-09-25: 30 mg via INTRAVENOUS
  Filled 2021-09-25: qty 1

## 2021-09-25 MED ORDER — AMLODIPINE BESYLATE 5 MG PO TABS
5.0000 mg | ORAL_TABLET | Freq: Once | ORAL | Status: AC
  Administered 2021-09-25: 5 mg via ORAL
  Filled 2021-09-25: qty 1

## 2021-09-25 MED ORDER — IOHEXOL 350 MG/ML SOLN
75.0000 mL | Freq: Once | INTRAVENOUS | Status: AC | PRN
  Administered 2021-09-25: 75 mL via INTRAVENOUS

## 2021-09-25 NOTE — ED Provider Notes (Signed)
[facility]-EMERGENCY DEPT Provider Note   CSN: [id number] Arrival date & time: 09/25/21  1656     History  Chief Complaint  Patient presents with   Abnormal Lab   Chest Pain    [name] is a 71 y.o. female.  With PMH of hypothyroidism, HTN, DM2, sarcoidosis, grade 2 diastolic dysfunction who presents with elevated D-dimer and right chest wall pain.  She denies any recent injuries but notes for the past 4 days pain with palpation and movement to her right chest wall and right upper quadrant.  She got a Toradol shot yesterday at her office and said she had significantly improved pain.  She says pain is worse when laying down flat.  She denies any pleurisy or pleuritic component with deep breathing.  She has never had a PE or DVT.  She did have recent right knee surgery in May but denies any increased pain or swelling in her right leg.  She has not had any fevers, cough, vomiting, diarrhea, urinary symptoms, shortness of breath.  She thought because of how much pain she had of her ribs that she may have cracked a rib but she denies any recent injury. No melena, hematochezia, or hematesis.   Abnormal Lab Chest Pain      Home Medications Prior to Admission medications   Medication Sig Start Date End Date Taking? Authorizing Provider  acyclovir (ZOVIRAX) 400 MG tablet Take 400 mg by mouth every 8 (eight) hours as needed. Fever blisters 05/19/20   [provider]  albuterol (VENTOLIN HFA) 108 (90 Base) MCG/ACT inhaler Inhale 2 puffs into the lungs every 6 (six) hours as needed. 12/09/20   Charlott Holler, MD  amLODipine (NORVASC) 5 MG tablet Take 1 tablet (5 mg total) by mouth daily. 05/08/21 05/03/22  Jodelle Red, MD  calcium-vitamin D (OSCAL WITH D) 500-200 MG-UNIT tablet Take 1 tablet by mouth daily with breakfast.    [provider]  carvedilol (COREG) 25 MG tablet Take 1 tablet (25 mg total) by mouth 2 (two) times daily with a  meal. Patient not taking: Reported on 05/19/2021 03/30/21   Jodelle Red, MD  cetirizine (ZYRTEC) 10 MG tablet Take 10 mg by mouth daily.    [provider]  docusate sodium (COLACE) 100 MG capsule Take 1 capsule (100 mg total) by mouth 2 (two) times daily. 05/20/21   Cassandria Anger, PA-C  furosemide (LASIX) 20 MG tablet Take 20-40 mg by mouth See admin instructions. Taking 20 mg every Sun., Tues., Thurs., and Sat. Taking 40 mg all other days.    [provider]  gabapentin (NEURONTIN) 300 MG capsule Take 300 mg by mouth 3 (three) times daily. 09/02/20   [provider]  irbesartan (AVAPRO) 150 MG tablet Take 1 tablet (150 mg total) by mouth daily. 02/18/21   Alver Sorrow, NP  levothyroxine (SYNTHROID, LEVOTHROID) 150 MCG tablet Take 75-150 mcg by mouth See admin instructions. Pt alternates between 150 mcg and 75 mcg each day with before breakfast    [provider]  metFORMIN (GLUCOPHAGE-XR) 500 MG 24 hr tablet Take 2 tablets (1,000 mg total) by mouth 2 (two) times daily. Patient taking differently: Take 1,000 mg by mouth daily with breakfast. 09/25/20   [name] Elk, MD  methocarbamol (ROBAXIN) 500 MG tablet Take 1 tablet (500 mg total) by mouth every 6 (six) hours as needed for muscle spasms. 05/20/21   Cassandria Anger, PA-C  polyethylene glycol [facility] /  GLYCOLAX) 17 g packet Take 17 g by mouth daily as needed for mild constipation. 05/20/21   Cassandria Anger, PA-C  rosuvastatin (CRESTOR) 10 MG tablet Take 10 mg by mouth daily.    [provider]  zolpidem (AMBIEN) 10 MG tablet Take 10 mg by mouth at bedtime.    [provider]      Allergies    Sulfa antibiotics, Atorvastatin, and Amoxicillin    Review of Systems   Review of Systems  Cardiovascular:  Positive for chest pain.    Physical Exam Updated Vital Signs BP (!) 187/63 (BP Location: Right Arm)   Pulse 87   Temp 99.3 F (37.4 C) (Oral)   Resp (!) 25   Ht  5\' 2"  (1.575 m)   Wt 72.1 kg   SpO2 95%   BMI 29.08 kg/m  Physical Exam  ED Results / Procedures / Treatments   Labs (all labs ordered are listed, but only abnormal results are displayed) Labs Reviewed  BASIC METABOLIC PANEL  CBC  D-DIMER, QUANTITATIVE  TROPONIN I (HIGH SENSITIVITY)    EKG None  Radiology No results found.  Procedures Procedures  {Document cardiac monitor, telemetry assessment procedure when appropriate:1}  Medications Ordered in ED Medications - No data to display  ED Course/ Medical Decision Making/ A&P Clinical Course as of 09/25/21 2248  [facility] Sep 25, 2021  Sep 27, 2021 Personal interpretation of patient's chest x-ray shows no consolidation concerning for pneumonia, no pleural effusion.  No pulmonary edema. [VB]  0623 Patient has a mild pancytopenia but hemoglobin and platelet count at baseline.  White blood cell 3.9.  Hemoglobin 11.  Platelet 92.  Slightly elevated glucose 152.  Normal creatinine 0.78.  BMP unremarkable 68.  High sensitive troponin 10, not c/w NSTEMI. [VB]    Clinical Course User Index [VB] [id number], MD                           Medical Decision Making [name] is a 71 y.o. female.  With PMH of hypothyroidism, HTN, DM2, sarcoidosis, grade 2 diastolic dysfunction who presents with elevated D-dimer and right chest wall pain.  Patient presents with atypical right upper quadrant and right chest wall pain that is easily reproducible.  Consider musculoskeletal pain versus possible PE especially with elevated D-dimer although hemodynamically stable with no hypoxia or tachycardia versus intra-abdominal pathology such as hepatitis.  She has history of cholecystectomy which makes gallbladder etiology less likely.  A chest x-ray was obtained with no evidence of pneumonia, no pulmonary edema, no pleural effusions or evidence of rib fracture.  Based on location of pain and reproducibility and prolonged symptoms, unlikely to be atypical ACS but  will evaluate troponins.  Her EKG was normal sinus rhythm with nonspecific ST/T wave changes in lateral leads.  We will plan to obtain CTA chest PE protocol and CT abdomen pelvis with IV contrast to further evaluate for intra-abdominal pathology or possible PE contributing to symptoms.  Amount and/or Complexity of Data Reviewed Labs: ordered. Radiology: ordered.  Risk Prescription drug management.   ***  {Document critical care time when appropriate:1} {Document review of labs and clinical decision tools ie heart score, Chads2Vasc2 etc:1}  {Document your independent review of radiology images, and any outside records:1} {Document your discussion with family members, caretakers, and with consultants:1} {Document social determinants of health affecting pt's care:1} {Document your decision making why or why not admission, treatments were needed:1} Final Clinical  Impression(s) / ED Diagnoses Final diagnoses:  None    Rx / DC Orders ED Discharge Orders     None

## 2021-09-25 NOTE — ED Provider Triage Note (Addendum)
Emergency Medicine Provider Triage Evaluation Note  [name] , a 71 y.o. female  was evaluated in triage.  Pt complains of 4 days of R CP that was intermittent. She leaned rightward and had a sudden sharp pain in R chest wall it's hurt since then. Not SOB unless she lays down which makes the pain worse.   No recent surgeries, hospitalization, long travel, hemoptysis, estrogen containing OCP, cancer history.  No history of PE or VTE.  RLE swelling d/t hx of knee replacement (surgery was May 2nd).  She states she went to see PCP and had labs done but told today dimer was elevated. Uncertain of level and unable to pull up result on phone.   She is overdue for BP medication   Review of Systems  Positive: R chest/rib pain Negative: Fever   Physical Exam  BP (!) 187/63 (BP Location: Right Arm)   Pulse 87   Temp 99.3 F (37.4 C) (Oral)   Resp (!) 25   Ht 5\' 2"  (1.575 m)   Wt 72.1 kg   SpO2 95%   BMI 29.08 kg/m  Gen:   Awake, no distress   Resp:  Normal effort  MSK:   Moves extremities without difficulty  Other:  Reproducible point TTP of R chest wall (lateral)  Medical Decision Making  Medically screening exam initiated at 5:26 PM.  Appropriate orders placed.  was informed that the remainder of the evaluation will be completed by another provider, this initial triage assessment does not replace that evaluation, and the importance of remaining in the ED until their evaluation is complete.  Recheck labs (elevated dimer may be age adjustable). Uncertain of wait time for major care.    [name], Gailen Shelter 09/25/21 1731    11/25/21, Gailen Shelter 09/25/21 1732

## 2021-09-25 NOTE — Discharge Instructions (Addendum)
You were seen in the emergency department today for chest pain and abdominal pain. Your workup did not reveal a definite cause of your symptoms but was generally reassuring.  There was no evidence of blood clot.  Return to the emergency department immediately if you develop recurrent, severe chest pain, shortness of breath, fainting spells, sudden sweatiness, worsening abdominal pain, new vomiting, new fevers or any other concerning symptoms.   Please also make an appointment to follow up with your primary care doctor within one week to assure improvement or resolution in symptoms. Further testing may be necessary, so it is extremely important to keep your follow-up appointment with your primary doctor.   As we discussed, I have also provided you with a referral to gastroenterology as you may require further work-up and MRI of your abdomen at some point to further evaluate the gallbladder region and liver but as we discussed, these are changes typically seen after gallbladder removals.

## 2021-09-25 NOTE — ED Triage Notes (Signed)
Pt reports R lower chest pain and SHOb upon lying flat. Pt states she went to PCP yesterday and was told to come here due to elevated D-dimer.

## 2021-09-28 ENCOUNTER — Other Ambulatory Visit: Payer: Medicare Other

## 2021-10-15 ENCOUNTER — Encounter (HOSPITAL_BASED_OUTPATIENT_CLINIC_OR_DEPARTMENT_OTHER): Payer: Self-pay

## 2021-10-15 NOTE — Telephone Encounter (Signed)
Please advise 

## 2021-11-04 ENCOUNTER — Telehealth: Payer: Self-pay | Admitting: Physician Assistant

## 2021-11-04 ENCOUNTER — Ambulatory Visit (HOSPITAL_BASED_OUTPATIENT_CLINIC_OR_DEPARTMENT_OTHER): Payer: Medicare Other | Admitting: Cardiology

## 2021-11-04 ENCOUNTER — Encounter (HOSPITAL_BASED_OUTPATIENT_CLINIC_OR_DEPARTMENT_OTHER): Payer: Self-pay | Admitting: Cardiology

## 2021-11-04 VITALS — BP 198/78 | HR 76 | Ht 62.0 in | Wt 164.5 lb

## 2021-11-04 DIAGNOSIS — I2510 Atherosclerotic heart disease of native coronary artery without angina pectoris: Secondary | ICD-10-CM | POA: Diagnosis not present

## 2021-11-04 DIAGNOSIS — I10 Essential (primary) hypertension: Secondary | ICD-10-CM | POA: Diagnosis not present

## 2021-11-04 DIAGNOSIS — I340 Nonrheumatic mitral (valve) insufficiency: Secondary | ICD-10-CM

## 2021-11-04 DIAGNOSIS — E1159 Type 2 diabetes mellitus with other circulatory complications: Secondary | ICD-10-CM | POA: Diagnosis not present

## 2021-11-04 DIAGNOSIS — I517 Cardiomegaly: Secondary | ICD-10-CM

## 2021-11-04 MED ORDER — AMLODIPINE BESYLATE 10 MG PO TABS: 10.0000 mg | ORAL_TABLET | Freq: Every day | ORAL | 3 refills | Status: DC

## 2021-11-04 NOTE — Patient Instructions (Addendum)
Medication Instructions:  Increasing the amlodipine to 10 mg daily. Check BP at home, bring BP cuff and log to the next visit.  *If you need a refill on your cardiac medications before your next appointment, please call your pharmacy*   Lab Work: None ordered today   Testing/Procedures: None ordered today   Follow-Up: At [facility], you and your health needs are our priority.  As part of our continuing mission to provide you with exceptional heart care, we have created designated Provider Care Teams.  These Care Teams include your primary Cardiologist (physician) and Advanced Practice Providers (APPs -  Physician Assistants and Nurse Practitioners) who all work together to provide you with the care you need, when you need it.  We recommend signing up for the patient portal called "MyChart".  Sign up information is provided on this After Visit Summary.  MyChart is used to connect with patients for Virtual Visits (Telemedicine).  Patients are able to view lab/test results, encounter notes, upcoming appointments, etc.  Non-urgent messages can be sent to your provider as well.   To learn more about what you can do with MyChart, go to [email or web address].    Your next appointment:   3 week(s)  The format for your next appointment:   In Person  Provider:   Buford Dresser, MD

## 2021-11-04 NOTE — Telephone Encounter (Signed)
Paged by answering service.  She was seen by Dr. Harrell Gave this afternoon.  Pending note.  Reported elevated blood pressure.  She took amlodipine 5 mg at 1 PM and 4 p.m.  She took her evening dose of carvedilol 25 mg with hydralazine 25 mg around 6 PM.  Afterwards her blood pressure was 220/105.  She is asymptomatic.  During my evaluation blood pressure improved to 195/95.  I have advised to take additional Avapro 903 mg if systolic blood pressure remains above 170 at 8 PM.  She will take her morning medication as prescribed.  If blood pressure remains elevated she will give [place] a call for further instruction.

## 2021-11-04 NOTE — Progress Notes (Signed)
Cardiology Office Note:    Date:  11/04/2021   ID:  [name], DOB December 31, 1950, MRN [id number]  PCP:  Jilda Panda, MD  Cardiologist:  Buford Dresser, MD PhD  Referring MD: Jilda Panda, MD   CC: follow-up  History of Present Illness:    [name] is a 71 y.o. female with a hx of type II diabetes, hypertension, sarcoidosis who is seen for follow up. She was initially seen 02/08/18 at the request of Jilda Panda, MD for the evaluation and management of chest pain.  She presented to the ED 09/25/2021 with complaints of chest pain with palpitation to her right chest wall and right upper quadrant. Sent from her PCP when her d-dimer was positive She received a Toradol shot a day prior at her office which significantly improved pain. She stated that pain was worse when lying flat. EKG was normal sinus rhythm with nonspecific ST/T wave changes in lateral leads. CTA chest PE and CT abdomen pelvis were unremarkable.  Today: Doing ok. Her replaced knee is doing well, thinks she will need another replacement next year.   BP has been running high recently. Has been systolics [id number] at home, over 200 in the office today. Hasn't seen any numbers as high as the ones in the office today. Reviewed meds, taking as prescribed. Has hydralazine at home for PRN, takes less than a few times/month.   Took Azo this AM for bladder symptoms.  She denies any palpitations, chest pain, shortness of breath, or peripheral edema. No lightheadedness, headaches, syncope, orthopnea, or PND.  Past Medical History:  Diagnosis Date   Arthritis    Asthma    CAD (coronary artery disease)    CHF (congestive heart failure) (HCC)    Complication of anesthesia    trouble waking up once   COPD (chronic obstructive pulmonary disease) ([place])    Diabetes mellitus without complication ([place])    Dysrhythmia    Heart murmur    Hypertension    Hypothyroidism    Liver disease    NAFLD cirrhosis   Mild aortic stenosis     Pneumonia    Sarcoidosis    Sarcoidosis of lung ([place])    Sarcoidosis of lymph nodes     Past Surgical History:  Procedure Laterality Date   ABDOMINAL HYSTERECTOMY     ABDOMINAL SURGERY     CHOLECYSTECTOMY     ESOPHAGOGASTRODUODENOSCOPY (EGD) WITH PROPOFOL N/A 04/17/2021   Procedure: ESOPHAGOGASTRODUODENOSCOPY (EGD) WITH PROPOFOL;  Surgeon: Carol Ada, MD;  Location: [facility];  Service: Gastroenterology;  Laterality: N/A;   HERNIA REPAIR     LEFT HEART CATH AND CORONARY ANGIOGRAPHY N/A 09/23/2020   Procedure: LEFT HEART CATH AND CORONARY ANGIOGRAPHY;  Surgeon: Troy Sine, MD;  Location: [place] [facility];  Service: Cardiovascular;  Laterality: N/A;   nissenfundiplication     TOTAL KNEE ARTHROPLASTY Right 05/19/2021   Procedure: TOTAL KNEE ARTHROPLASTY;  Surgeon: Paralee Cancel, MD;  Location: [facility];  Service: Orthopedics;  Laterality: Right;    Current Medications: Current Outpatient Medications on File Prior to Visit  Medication Sig   acyclovir (ZOVIRAX) 400 MG tablet Take 400 mg by mouth every 8 (eight) hours as needed. Fever blisters   albuterol (VENTOLIN HFA) 108 (90 Base) MCG/ACT inhaler Inhale 2 puffs into the lungs every 6 (six) hours as needed.   amLODipine (NORVASC) 5 MG tablet Take 1 tablet (5 mg total) by mouth daily.   calcium-vitamin D (OSCAL WITH D) 500-200 MG-UNIT tablet  Take 1 tablet by mouth daily with breakfast.   carvedilol (COREG) 25 MG tablet Take 1 tablet (25 mg total) by mouth 2 (two) times daily with a meal. (Patient not taking: Reported on 05/19/2021)   cetirizine (ZYRTEC) 10 MG tablet Take 10 mg by mouth daily.   docusate sodium (COLACE) 100 MG capsule Take 1 capsule (100 mg total) by mouth 2 (two) times daily.   furosemide (LASIX) 20 MG tablet Take 20-40 mg by mouth See admin instructions. Taking 20 mg every Sun., Tues., Thurs., and Sat. Taking 40 mg all other days.   gabapentin (NEURONTIN) 300 MG capsule Take 300 mg by mouth 3 (three) times daily.    irbesartan (AVAPRO) 150 MG tablet Take 1 tablet (150 mg total) by mouth daily.   levothyroxine (SYNTHROID, LEVOTHROID) 150 MCG tablet Take 75-150 mcg by mouth See admin instructions. Pt alternates between 150 mcg and 75 mcg each day with before breakfast   metFORMIN (GLUCOPHAGE-XR) 500 MG 24 hr tablet Take 2 tablets (1,000 mg total) by mouth 2 (two) times daily. (Patient taking differently: Take 1,000 mg by mouth daily with breakfast.)   methocarbamol (ROBAXIN) 500 MG tablet Take 1 tablet (500 mg total) by mouth every 6 (six) hours as needed for muscle spasms.   polyethylene glycol (MIRALAX / GLYCOLAX) 17 g packet Take 17 g by mouth daily as needed for mild constipation.   rosuvastatin (CRESTOR) 10 MG tablet Take 10 mg by mouth daily.   zolpidem (AMBIEN) 10 MG tablet Take 10 mg by mouth at bedtime.   No current facility-administered medications on file prior to visit.     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  Family History: The patient's family history includes Breast cancer (age of onset: 61) in her mother; Heart attack in her brother, brother, father, and mother; Heart failure in her father; Hypertension in her father.  ROS:   Please see the history of present illness.    Additional pertinent ROS otherwise negative.   EKGs/Labs/Other Studies Reviewed:    The following studies were reviewed today:  RLE Venous Doppler 10/09/2020: Summary:  RIGHT:  - No evidence of deep vein thrombosis in the lower extremity. No indirect  evidence of obstruction proximal to the inguinal ligament.  - No cystic structure found in the popliteal fossa.     LEFT:  - No evidence of  common femoral vein obstruction.    Cardiac MRI 09/25/20 IMPRESSION: Asymmetric septal hypertrophy without other evidence of infiltrative disease and systolic anterior motion of the mitral valve: This can be seen in hypertropic obstructive cardiomyopathy.   Moderate mitral regurgitation.  L Heart Cath 09/23/20   Prox Cx lesion is 20% stenosed.   Mild nonobstructive CAD with tortuous vessels and smooth 20% narrowing in the proximal circumflex far artery.  The LAD appears angiographically normal.  The right coronary artery is a dominant vessel with superior takeoff.   Mild LV to aorta gradient on pullback with a peak to peak gradient at approximately 16 mmHg.   RECOMMENDATION: Medical therapy.  Echo 09/23/20  1. Left ventricular ejection fraction, by estimation, is 65  to 70%. The  left ventricle has normal function. The left ventricle has no regional  wall motion abnormalities. Left ventricular diastolic parameters are  consistent with Grade II diastolic  dysfunction (pseudonormalization).   2. Right ventricular systolic function is normal. The right ventricular  size is normal.   3. Left atrial size was mildly dilated.   4. There is systolic anterior motion of the MV leaflet. . The mitral  valve is normal in structure. Moderate mitral valve regurgitation.   5. The aortic valve is normal in structure. Aortic valve regurgitation is  not visualized.   Monitor 02/23/18 3 days of data recorded on Zio monitor. Patient had a min HR of 58 bpm, max HR of 129 bpm, and avg HR of 75 bpm. Predominant underlying rhythm was Sinus Rhythm. No VT, SVT, atrial fibrillation, high degree block, or pauses noted. Isolated atrial ectopy was rare (<1%). Overall PVC burden was 4.8%, with rare couplets. Longest ventricular bigeminy episode was 11 seconds, and longest ventricular trigeminy episode was 1 minute 50 seconds. There were no patient triggered events.   Lexiscan 02/23/18 Nuclear stress EF: 63%. The study  is normal. This is a low risk study. The left ventricular ejection fraction is normal (55-65%).   Normal stress nuclear study with no ischemia or infarction; EF 63 with normal wall motion.   Echo 2014 Left ventricle: The cavity size was normal. Wall thickness   was increased in a pattern of moderate LVH. Systolic   function was vigorous. The estimated ejection fraction was   in the range of 65% to 70%. Wall motion was normal; there   were no regional wall motion abnormalities. - Mitral valve: Mild regurgitation.  EKG:  EKG was not ordered today 11/04/21: not ordered today 05/08/21: NSR at 70 bpm 02/08/18: normal sinus rhythm, borderline LVH criteria  Recent Labs: 09/25/2021: ALT 35; B Natriuretic Peptide 68.2; BUN 14; Creatinine, Ser 0.78; Hemoglobin 11.0; Platelets 92; Potassium 3.6; Sodium 140  Recent Lipid Panel    Component Value Date/Time   CHOL 116 09/23/2020 0210   TRIG 24 09/23/2020 0210   HDL 48 09/23/2020 0210   CHOLHDL 2.4 09/23/2020 0210   VLDL 5 09/23/2020 0210   LDLCALC 63 09/23/2020 0210    Physical Exam:    VS:  BP (!) 198/78 (BP Location: Right Arm, Cuff Size: Normal)   Pulse 76   Ht 5\' 2"  (1.575 m)   Wt 164 lb 8 oz (74.6 kg)   SpO2 98%   BMI 30.09 kg/m     Wt Readings from Last 3 Encounters:  09/25/21 159 lb (72.1 kg)  05/19/21 165 lb (74.8 kg)  05/14/21 165 lb (74.8 kg)     GEN: Well nourished, well developed in no acute distress HEENT: Normal NECK: No JVD; No carotid bruits LYMPHATICS: No lymphadenopathy CARDIAC: regular rhythm, normal S1 and S2, no rubs, gallops. 2/6 holosytolic murmur, does not change with valsalva. Radial and DP pulses 2+ bilaterally. RESPIRATORY:  Clear to auscultation without rales, wheezing or rhonchi  ABDOMEN: Soft, non-tender, non-distended MUSCULOSKELETAL:  No edema; No deformity  SKIN: Warm and dry NEUROLOGIC:  Alert and oriented x 3 PSYCHIATRIC:  Normal affect   ASSESSMENT:    1. Essential hypertension   2.  Nonocclusive coronary atherosclerosis of native coronary artery   3. Type 2 diabetes mellitus with other circulatory complication, without long-term current use of insulin ([place])   4. Nonrheumatic mitral valve regurgitation   5. LVH (left ventricular hypertrophy)  PLAN:    Elevated blood pressure today Hypertension, chronic -increase amlodipine to 10 mg daily. Did have some edema at this dose prior -monitor BP at home -BP check in 3 weeks  Nonobstructive CAD by cath Type II diabetes -no angina -continue aspirin, rosuvastatin -on semaglutide (GLP1RA)  Mild aortic stenosis Moderate mitral regurgitation, systolic anterior motion of the mitral valve with asymmetric septal hypertrophy Hypertension -heart rate is 70s-80s -given SAM/LVOT flow, increased carvedilol, now doing great without issues. -continue furosemide, irbesartan. Avoid dehydration/hyperdynamic LV given septal hypertrophy/SAM -no syncope -home blood pressure monitoring reviewed -stopping hydralazine given abnormal ANA. [place] Kitchen  Plan for follow up: 3 weeks  Medication Adjustments/Labs and Tests Ordered: Current medicines are reviewed at length with the patient today.  Concerns regarding medicines are outlined above.  No orders of the defined types were placed in this encounter.  No orders of the defined types were placed in this encounter.  Patient Instructions  Medication Instructions:  Increasing the amlodipine to 10 mg daily. Check BP at home, bring BP cuff and log to the next visit.  *If you need a refill on your cardiac medications before your next appointment, please call your pharmacy*   Lab Work: None ordered today   Testing/Procedures: None ordered today   Follow-Up: At [facility], you and your health needs are our priority.  As part of our continuing mission to provide you with exceptional heart care, we have created designated Provider Care Teams.  These Care Teams include your primary  Cardiologist (physician) and Advanced Practice Providers (APPs -  Physician Assistants and Nurse Practitioners) who all work together to provide you with the care you need, when you need it.  We recommend signing up for the patient portal called "MyChart".  Sign up information is provided on this After Visit Summary.  MyChart is used to connect with patients for Virtual Visits (Telemedicine).  Patients are able to view lab/test results, encounter notes, upcoming appointments, etc.  Non-urgent messages can be sent to your provider as well.   To learn more about what you can do with MyChart, go to [email or web address].    Your next appointment:   3 week(s)  The format for your next appointment:   In Person  Provider:   Buford Dresser, MD        I,Breanna Adamick,acting as a scribe for Buford Dresser, MD.,have documented all relevant documentation on the behalf of Buford Dresser, MD,as directed by  Buford Dresser, MD while in the presence of Buford Dresser, MD.  I, Buford Dresser, MD, have reviewed all documentation for this visit. The documentation on 12/02/21 for the exam, diagnosis, procedures, and orders are all accurate and complete.   Signed, Buford Dresser, MD PhD 11/04/2021  [place]

## 2021-11-06 NOTE — Progress Notes (Signed)
Office Visit Note  Patient: [name]             Date of Birth: 29-Nov-1950           MRN: [id number]             PCP: Jilda Panda, MD Referring: Jilda Panda, MD Visit Date: 11/20/2021 Occupation: '@GUAROCC' @  Subjective:  Pain in multiple joints  History of Present Illness: AALEAH HIRSCH is a 71 y.o. 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  According the patient she has known history of sarcoidosis, osteoarthritis and degenerative disc disease.  She was diagnosed with sarcoidosis in 2010 after hiatal hernia repair.  She had a lymph node which was biopsied and it was positive.  She was evaluated by the pulmonologist but was not given any treatment.  She denies any shortness of breath.  She states that she has had problems with her neck for a long time and had ablation for her cervical spine which helped.  She has osteoarthritis in her knee joints for over 10 years and had cortisone injections over time by Dr. Alvan Dame.  She underwent right total knee replacement in May 2023 and had a good response to it.  She is planning to have left total knee replacement next year.  She states every afternoon around 4 PM her both legs start hurting.  She also gives history of discomfort in her feet occasionally.  She gives history of fluid retention in her bilateral lower extremities.  She complains of discomfort in her shoulders, elbows, wrist and her hands.  She has not noticed any joint swelling.  There is no history of oral ulcers, nasal ulcers, malar rash, photosensitivity, Raynaud's phenomenon or lymphadenopathy.  Recently had labs by her PCP and her ANA was positive for that reason she was referred to me.  There is no family history of autoimmune disease.  Gravida 2, para 2, miscarriages 0.  No history of DVTs.  Activities of Daily Living:  Patient reports morning stiffness for a few minutes.   Patient Reports nocturnal pain.  Difficulty dressing/grooming: Reports Difficulty  climbing stairs: Denies Difficulty getting out of chair: Reports Difficulty using hands for taps, buttons, cutlery, and/or writing: Denies  Review of Systems  Constitutional:  Positive for fatigue.  HENT:  Positive for mouth dryness. Negative for mouth sores.   Eyes:  Negative for dryness.  Respiratory:  Negative for shortness of breath.   Cardiovascular:  Negative for chest pain and palpitations.  Gastrointestinal:  Positive for blood in stool. Negative for constipation and diarrhea.  Endocrine: Negative for increased urination.  Genitourinary:  Negative for involuntary urination.  Musculoskeletal:  Positive for joint pain, joint pain, joint swelling, myalgias, muscle weakness, morning stiffness, muscle tenderness and myalgias. Negative for gait problem.  Skin:  Positive for hair loss. Negative for color change, rash and sensitivity to sunlight.  Allergic/Immunologic: Negative for susceptible to infections.  Neurological:  Negative for dizziness and headaches.  Hematological:  Negative for swollen glands.  Psychiatric/Behavioral:  Negative for depressed mood and sleep disturbance. The patient is nervous/anxious.     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   Chest pain 09/23/2020   Thrombocytopenia ([place]) 09/23/2020  Dehydration 06/22/2012   AKI (acute kidney injury) ([place]) 06/22/2012   Hypothyroidism 06/22/2012   HTN (hypertension) 06/22/2012   Uncontrolled type 2 diabetes mellitus with hyperglycemia ([place]) 06/22/2012   Sarcoidosis 06/22/2012   Heart murmur 06/22/2012   UTI (urinary tract infection) 06/22/2012    Past Medical History:  Diagnosis Date   Arthritis    Asthma    CAD (coronary artery disease)    CHF  (congestive heart failure) (HCC)    Complication of anesthesia    trouble waking up once   COPD (chronic obstructive pulmonary disease) ([place])    Diabetes mellitus without complication ([place])    Dysrhythmia    Heart murmur    Hypertension    Hypothyroidism    Liver disease    NAFLD cirrhosis   Mild aortic stenosis    Pneumonia    Sarcoidosis    Sarcoidosis of lung ([place])    Sarcoidosis of lymph nodes     Family History  Problem Relation Age of Onset   Diabetes Mother    Heart attack Mother    Heart failure Father    Hypertension Father    Heart attack Father    Heart Problems Sister    Blindness Sister    Diabetes Sister    Hypertension Sister    Heart attack Brother    Heart attack Brother    Throat cancer Brother    Healthy Son    Rheum arthritis Daughter    Past Surgical History:  Procedure Laterality Date   ABDOMINAL HYSTERECTOMY     ABDOMINAL SURGERY     CHOLECYSTECTOMY     ESOPHAGOGASTRODUODENOSCOPY (EGD) WITH PROPOFOL N/A 04/17/2021   Procedure: ESOPHAGOGASTRODUODENOSCOPY (EGD) WITH PROPOFOL;  Surgeon: Carol Ada, MD;  Location: [facility];  Service: Gastroenterology;  Laterality: N/A;   HERNIA REPAIR     LASIK  2001   LEFT HEART CATH AND CORONARY ANGIOGRAPHY N/A 09/23/2020   Procedure: LEFT HEART CATH AND CORONARY ANGIOGRAPHY;  Surgeon: Troy Sine, MD;  Location: [facility];  Service: Cardiovascular;  Laterality: N/A;   nissenfundiplication     TOTAL KNEE ARTHROPLASTY Right 05/19/2021   Procedure: TOTAL KNEE ARTHROPLASTY;  Surgeon: Paralee Cancel, MD;  Location: [facility];  Service: Orthopedics;  Laterality: Right;   Social History   Social History Narrative   Not on file   Immunization History  Administered Date(s) Administered   Fluad Quad(high Dose 65+) 11/23/2020     Objective: Vital Signs: BP (!) 152/64 (BP Location: Right Arm, Patient Position: Sitting, Cuff Size: Normal)   Pulse 65   Resp 16   Ht 5' 2.5" (1.588 m)   Wt 165 lb  6.4 oz (75 kg)   BMI 29.77 kg/m    Physical Exam Vitals and nursing note reviewed.  Constitutional:      Appearance: She is well-developed.  HENT:     Head: Normocephalic and atraumatic.  Eyes:     Conjunctiva/sclera: Conjunctivae normal.  Cardiovascular:     Rate and Rhythm: Normal rate and regular rhythm.     Heart sounds: Normal heart sounds.  Pulmonary:     Effort: Pulmonary effort is normal.     Breath sounds: Normal breath sounds.  Abdominal:     General: Bowel sounds are normal.     Palpations: Abdomen is soft.  Musculoskeletal:     Cervical back: Normal range of motion.     Right lower leg: Edema present.     Left lower leg: Edema present.  Lymphadenopathy:     Cervical: No cervical adenopathy.  Skin:    General: Skin is warm and dry.     Capillary Refill: Capillary refill takes less than 2 seconds.  Neurological:     Mental Status: She is alert and oriented to person, place, and time.  Psychiatric:        Behavior: Behavior normal.      Musculoskeletal Exam: She had good range of motion of the cervical spine.  Mild thoracic kyphosis was noted.  She had no tenderness over the lumbar spine.  No SI joint tenderness was noted.  Shoulder joints with good range of motion with some discomfort.  Elbow joints with good range of motion with no synovitis.  She had no tenderness over wrist joints, MCPs PIPs and DIPs.  PIP and DIP thickening with no synovitis was noted.  Hip joints with good range of motion.  Right knee joint was replaced with limited extension.  She had limited extension of the left knee joint without any warmth swelling or effusion.  There was no tenderness over ankles or MTPs.  Overcrowding of toes was noted.  Bilateral lower extremity edema was noted.  CDAI Exam: CDAI Score: -- Patient Global: --; Provider Global: -- Swollen: --; Tender: -- Joint Exam 11/20/2021   No joint exam has been documented for this visit   There is currently no information  documented on the homunculus. Go to the Rheumatology activity and complete the homunculus joint exam.  Investigation: No additional findings.  Imaging: No results found.  Recent Labs: Lab Results  Component Value Date   WBC 3.9 (L) 09/25/2021   HGB 11.0 (L) 09/25/2021   PLT 92 (L) 09/25/2021   NA 140 09/25/2021   K 3.6 09/25/2021   CL 104 09/25/2021   CO2 29 09/25/2021   GLUCOSE 152 (H) 09/25/2021   BUN 14 09/25/2021   CREATININE 0.78 09/25/2021   BILITOT 0.8 09/25/2021   ALKPHOS 112 09/25/2021   AST 39 09/25/2021   ALT 35 09/25/2021   PROT 6.9 09/25/2021   ALBUMIN 3.5 09/25/2021   CALCIUM 9.4 09/25/2021   GFRAA >60 02/07/2018   September 25, 2021 chest x-ray normal except for minimal scarring or atelectasis at the left base.   Speciality Comments: No specialty comments available.  Procedures:  No procedures performed Allergies: Sulfa antibiotics, Atorvastatin, and Amoxicillin   Assessment / Plan:     Visit Diagnoses: Pain in both hands -complains of pain and discomfort in her bilateral hands.  She has not noticed any swelling.  She states the discomfort is mostly in the wrist joints in her fingers.  No synovitis was noted on the examination.  I will obtain labs and x-rays.  Plan: XR Hand 2 View Right, XR Hand 2 View Left, x-rays of the bilateral hands were consistent with osteoarthritis.  Generalized osteopenia was noted.  Rheumatoid factor  Polyarthralgia-she gives history of pain in multiple joints which moves around.  She states pain is sometimes in her shoulders sometimes in her elbows, wrist, hands, knees and her feet.  She has not noticed any joint swelling.  She notices pedal edema.  She states her lower extremities hurt at night.  S/P total knee arthroplasty, right-May 19, 2021 by Dr. Alvan Dame.  She had good results to the surgery.  She had limited extension.  Primary osteoarthritis of left knee-she continues to have pain and discomfort in the left knee.  She had  limited extension of the left knee without any warmth  swelling or effusion.  Arthropathy of cervical facet joint-patient states that discomfort improved after the ablation procedure.  Positive ANA (antinuclear antibody) - 04/28/21:ANA 1280NH, chromatin 3.8, dsDNA 3, anti-CCP<16, RNP-, Sm-, ESR 14 -she was referred to me for the evaluation of positive ANA.  There is no history of oral ulcers, nasal ulcers, malar rash, photosensitivity, Raynaud's phenomenon or lymphadenopathy.  I will obtain some additional labs today.  Plan: Protein / creatinine ratio, urine, COMPLETE METABOLIC PANEL WITH GFR, ANA, Sjogrens syndrome-A extractable nuclear antibody, Sjogrens syndrome-B extractable nuclear antibody, C3 and C4  Sarcoidosis -patient states that her lymph node biopsy was positive when she had hiatal hernia surgery.  She did not require any surgery.  She never developed shortness of breath.  She denies shortness of breath today.  Dr. Shearon Stalls. 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 Elevated ACE 2012 - Plan: Angiotensin converting enzyme  Myalgia -she gives history of muscle pain in her lower extremities especially in the afternoon.  She believes the pain is related to her knee joint arthritis.  She had difficulty getting up from the chair due to knee joint pain.  I will check CK today.  Plan: CK  Thrombocytopenia (HCC)-she has longstanding history of thrombocytopenia.  Based on the chart review its been going on since 2019.  Patient wants me to refer her to hematology for evaluation.  Other medical problems are listed as follows:  Systolic anterior movement of mitral valve  Diastolic dysfunction - Note by Dr. Harrell Gave  Heart murmur  Primary hypertension-blood pressure reading was elevated which was repeated in the office.  She was advised to monitor blood pressure closely and follow-up with her PCP.  Uncontrolled type 2 diabetes mellitus with hyperglycemia (HCC)  Dyslipidemia  Other cirrhosis of liver ([place])  - followed by Dr. Collene Mares.  History of hiatal hernia  History of hypothyroidism  Orders: Orders Placed This Encounter  Procedures   XR Hand 2 View Right   XR Hand 2 View Left   Protein / creatinine ratio, urine   COMPLETE METABOLIC PANEL WITH GFR   Angiotensin converting enzyme   ANA   Sjogrens syndrome-A extractable nuclear antibody   Sjogrens syndrome-B extractable nuclear antibody   C3 and C4   Rheumatoid factor   CK   Ambulatory referral to Hematology / Oncology   No orders of the defined types were placed in this encounter.    Follow-Up Instructions: Return for pain in multiple joints.   Bo Merino, MD  Note - This record has been created using Editor, commissioning.  Chart creation errors have been sought, but may not always  have been located. Such creation errors do not reflect on  the standard of medical care.

## 2021-11-09 ENCOUNTER — Ambulatory Visit (HOSPITAL_BASED_OUTPATIENT_CLINIC_OR_DEPARTMENT_OTHER): Payer: Medicare Other | Admitting: Cardiology

## 2021-11-20 ENCOUNTER — Telehealth (HOSPITAL_BASED_OUTPATIENT_CLINIC_OR_DEPARTMENT_OTHER): Payer: Self-pay | Admitting: Cardiology

## 2021-11-20 ENCOUNTER — Ambulatory Visit: Payer: Medicare Other | Attending: Rheumatology | Admitting: Rheumatology

## 2021-11-20 ENCOUNTER — Ambulatory Visit (INDEPENDENT_AMBULATORY_CARE_PROVIDER_SITE_OTHER): Payer: Medicare Other

## 2021-11-20 ENCOUNTER — Encounter: Payer: Self-pay | Admitting: Rheumatology

## 2021-11-20 VITALS — BP 152/64 | HR 65 | Resp 16 | Ht 62.5 in | Wt 165.4 lb

## 2021-11-20 DIAGNOSIS — E1165 Type 2 diabetes mellitus with hyperglycemia: Secondary | ICD-10-CM

## 2021-11-20 DIAGNOSIS — M79641 Pain in right hand: Secondary | ICD-10-CM

## 2021-11-20 DIAGNOSIS — M47812 Spondylosis without myelopathy or radiculopathy, cervical region: Secondary | ICD-10-CM | POA: Diagnosis not present

## 2021-11-20 DIAGNOSIS — I5189 Other ill-defined heart diseases: Secondary | ICD-10-CM

## 2021-11-20 DIAGNOSIS — M79642 Pain in left hand: Secondary | ICD-10-CM

## 2021-11-20 DIAGNOSIS — D696 Thrombocytopenia, unspecified: Secondary | ICD-10-CM

## 2021-11-20 DIAGNOSIS — E785 Hyperlipidemia, unspecified: Secondary | ICD-10-CM

## 2021-11-20 DIAGNOSIS — R768 Other specified abnormal immunological findings in serum: Secondary | ICD-10-CM

## 2021-11-20 DIAGNOSIS — K7460 Unspecified cirrhosis of liver: Secondary | ICD-10-CM | POA: Insufficient documentation

## 2021-11-20 DIAGNOSIS — Z8639 Personal history of other endocrine, nutritional and metabolic disease: Secondary | ICD-10-CM

## 2021-11-20 DIAGNOSIS — Z96651 Presence of right artificial knee joint: Secondary | ICD-10-CM | POA: Diagnosis not present

## 2021-11-20 DIAGNOSIS — M1712 Unilateral primary osteoarthritis, left knee: Secondary | ICD-10-CM

## 2021-11-20 DIAGNOSIS — I10 Essential (primary) hypertension: Secondary | ICD-10-CM

## 2021-11-20 DIAGNOSIS — R011 Cardiac murmur, unspecified: Secondary | ICD-10-CM

## 2021-11-20 DIAGNOSIS — K760 Fatty (change of) liver, not elsewhere classified: Secondary | ICD-10-CM

## 2021-11-20 DIAGNOSIS — I3489 Other nonrheumatic mitral valve disorders: Secondary | ICD-10-CM

## 2021-11-20 DIAGNOSIS — M2550 Pain in unspecified joint: Secondary | ICD-10-CM

## 2021-11-20 DIAGNOSIS — Z8719 Personal history of other diseases of the digestive system: Secondary | ICD-10-CM

## 2021-11-20 DIAGNOSIS — K7469 Other cirrhosis of liver: Secondary | ICD-10-CM | POA: Insufficient documentation

## 2021-11-20 DIAGNOSIS — M791 Myalgia, unspecified site: Secondary | ICD-10-CM

## 2021-11-20 DIAGNOSIS — D869 Sarcoidosis, unspecified: Secondary | ICD-10-CM

## 2021-11-20 NOTE — Telephone Encounter (Signed)
Spoke with patient regarding new appointment date and time with Dr. Elmarie Mainland 12/02/21 at 11:40 am--Dr. Harrell Gave out of the office for a few hours on 11/30/21.  Patient voiced her understanding.

## 2021-11-20 NOTE — Telephone Encounter (Signed)
Spoke with patient regarding new appointment date and time---Wednesday 12/02/21 at 69:

## 2021-11-23 LAB — PROTEIN / CREATININE RATIO, URINE
Creatinine, Urine: 29 mg/dL (ref 20–275)
Protein/Creat Ratio: 138 mg/g creat (ref 24–184)
Protein/Creatinine Ratio: 0.138 mg/mg creat (ref 0.024–0.184)
Total Protein, Urine: 4 mg/dL — ABNORMAL LOW (ref 5–24)

## 2021-11-23 LAB — COMPLETE METABOLIC PANEL WITH GFR
AG Ratio: 1 (calc) (ref 1.0–2.5)
ALT: 41 U/L — ABNORMAL HIGH (ref 6–29)
AST: 45 U/L — ABNORMAL HIGH (ref 10–35)
Albumin: 3.7 g/dL (ref 3.6–5.1)
Alkaline phosphatase (APISO): 133 U/L (ref 37–153)
BUN: 12 mg/dL (ref 7–25)
CO2: 29 mmol/L (ref 20–32)
Calcium: 9.8 mg/dL (ref 8.6–10.4)
Chloride: 106 mmol/L (ref 98–110)
Creat: 0.7 mg/dL (ref 0.60–1.00)
Globulin: 3.7 g/dL (calc) (ref 1.9–3.7)
Glucose, Bld: 98 mg/dL (ref 65–99)
Potassium: 3.9 mmol/L (ref 3.5–5.3)
Sodium: 142 mmol/L (ref 135–146)
Total Bilirubin: 0.6 mg/dL (ref 0.2–1.2)
Total Protein: 7.4 g/dL (ref 6.1–8.1)
eGFR: 91 mL/min/{1.73_m2} (ref >=60)

## 2021-11-23 LAB — C3 AND C4
C3 Complement: 134 mg/dL (ref 83–193)
C4 Complement: 23 mg/dL (ref 15–57)

## 2021-11-23 LAB — ANTI-NUCLEAR AB-TITER (ANA TITER): ANA Titer 1: 1:1280 {titer} — ABNORMAL HIGH

## 2021-11-23 LAB — ANA: Anti Nuclear Antibody (ANA): POSITIVE — AB

## 2021-11-23 LAB — RHEUMATOID FACTOR: Rheumatoid fact SerPl-aCnc: <14 IU/mL (ref <14)

## 2021-11-23 LAB — SJOGRENS SYNDROME-A EXTRACTABLE NUCLEAR ANTIBODY: SSA (Ro) (ENA) Antibody, IgG: <1 AI

## 2021-11-23 LAB — ANGIOTENSIN CONVERTING ENZYME: Angiotensin-Converting Enzyme: 100 U/L — ABNORMAL HIGH (ref 9–67)

## 2021-11-23 LAB — CK: Total CK: 25 U/L — ABNORMAL LOW (ref 29–143)

## 2021-11-23 LAB — SJOGRENS SYNDROME-B EXTRACTABLE NUCLEAR ANTIBODY: SSB (La) (ENA) Antibody, IgG: <1 AI

## 2021-11-23 NOTE — Progress Notes (Signed)
I will discuss results at the fu visit. LFTs are elevated . Please forward results to her PCP.

## 2021-11-24 ENCOUNTER — Telehealth: Payer: Self-pay | Admitting: Hematology and Oncology

## 2021-11-24 NOTE — Telephone Encounter (Signed)
Scheduled appointment per 11/07 referral. Patient is aware of appointment date and time. Patient is aware to arrive 15 mins prior to appointment time and to bring updated insurance cards. Patient is aware of location.   

## 2021-11-30 ENCOUNTER — Ambulatory Visit (HOSPITAL_BASED_OUTPATIENT_CLINIC_OR_DEPARTMENT_OTHER): Payer: Medicare Other | Admitting: Cardiology

## 2021-12-02 ENCOUNTER — Ambulatory Visit (HOSPITAL_BASED_OUTPATIENT_CLINIC_OR_DEPARTMENT_OTHER): Payer: Medicare Other | Admitting: Cardiology

## 2021-12-02 ENCOUNTER — Encounter (HOSPITAL_BASED_OUTPATIENT_CLINIC_OR_DEPARTMENT_OTHER): Payer: Self-pay | Admitting: Cardiology

## 2021-12-02 VITALS — BP 162/78 | HR 74 | Ht 62.5 in | Wt 169.5 lb

## 2021-12-02 DIAGNOSIS — I2510 Atherosclerotic heart disease of native coronary artery without angina pectoris: Secondary | ICD-10-CM

## 2021-12-02 DIAGNOSIS — Z5181 Encounter for therapeutic drug level monitoring: Secondary | ICD-10-CM

## 2021-12-02 DIAGNOSIS — R768 Other specified abnormal immunological findings in serum: Secondary | ICD-10-CM

## 2021-12-02 DIAGNOSIS — I3489 Other nonrheumatic mitral valve disorders: Secondary | ICD-10-CM | POA: Diagnosis not present

## 2021-12-02 DIAGNOSIS — I517 Cardiomegaly: Secondary | ICD-10-CM

## 2021-12-02 DIAGNOSIS — I340 Nonrheumatic mitral (valve) insufficiency: Secondary | ICD-10-CM

## 2021-12-02 DIAGNOSIS — I10 Essential (primary) hypertension: Secondary | ICD-10-CM | POA: Diagnosis not present

## 2021-12-02 NOTE — Progress Notes (Signed)
Cardiology Office Note:    Date:  12/02/2021   ID:  [name], DOB 1950-07-28, MRN [id number]  PCP:  Ralene Ok, MD  Cardiologist:  Jodelle Red, MD PhD  Referring MD: Ralene Ok, MD   CC: follow-up  History of Present Illness:    [name] is a 71 y.o. female with a hx of type II diabetes, hypertension, sarcoidosis who is seen for follow up. She was initially seen 02/08/18 at the request of Ralene Ok, MD for the evaluation and management of chest pain.  She presented to the ED 09/25/2021 with complaints of chest pain with palpitation to her right chest wall and right upper quadrant. Sent from her PCP when her d-dimer was positive She received a Toradol shot a day prior at her office which significantly improved pain. She stated that pain was worse when lying flat. EKG was normal sinus rhythm with nonspecific ST/T wave changes in lateral leads. CTA chest PE and CT abdomen pelvis were unremarkable.  At her last visit her blood pressure was averaging 140-160 systolic at home, and was over 200 systolic in clinic. Reviewed meds,was taking as prescribed. Only needed prn hydralazine a few times/month. We stopped hydralazine given abnormal ANA. Restarted amlodipine.  Today, she reports her blood pressures have been labile. She presents a blood pressure log today. This morning her BP was 107/67 (2 hours after taking her antihypertensives), and in clinic today it is 192/72 (improved to 162/78 on recheck). Per her log, most of the readings are higher than 130/63 with the highest being 177/80. She denies feeling any differently when her blood pressure is very high or low. It is very seldom that she needs to take her prn hydralazine.  Due to significant LE pain, she states that she needs to take either Ibuprofen or tylenol. We discussed that tylenol would be recommended from a cardiovascular perspective.  Sometimes she feels off-balance. She has not fallen and denies any syncopal  episodes.  Lately she has been working on increasing her physical exercise.  She denies any palpitations, chest pain, shortness of breath, lightheadedness, headaches, orthopnea, or PND.   Past Medical History:  Diagnosis Date   Arthritis    Asthma    CAD (coronary artery disease)    CHF (congestive heart failure) (HCC)    Complication of anesthesia    trouble waking up once   COPD (chronic obstructive pulmonary disease) (HCC)    Diabetes mellitus without complication (HCC)    Dysrhythmia    Heart murmur    Hypertension    Hypothyroidism    Liver disease    NAFLD cirrhosis   Mild aortic stenosis    Pneumonia    Sarcoidosis    Sarcoidosis of lung (HCC)    Sarcoidosis of lymph nodes     Past Surgical History:  Procedure Laterality Date   ABDOMINAL HYSTERECTOMY     ABDOMINAL SURGERY     CHOLECYSTECTOMY     ESOPHAGOGASTRODUODENOSCOPY (EGD) WITH PROPOFOL N/A 04/17/2021   Procedure: ESOPHAGOGASTRODUODENOSCOPY (EGD) WITH PROPOFOL;  Surgeon: Jeani Hawking, MD;  Location: [facility];  Service: Gastroenterology;  Laterality: N/A;   HERNIA REPAIR     LASIK  2001   LEFT HEART CATH AND CORONARY ANGIOGRAPHY N/A 09/23/2020   Procedure: LEFT HEART CATH AND CORONARY ANGIOGRAPHY;  Surgeon: Lennette Bihari, MD;  Location: [facility];  Service: Cardiovascular;  Laterality: N/A;   nissenfundiplication     TOTAL KNEE ARTHROPLASTY Right 05/19/2021   Procedure: TOTAL  KNEE ARTHROPLASTY;  Surgeon: Durene Romans, MD;  Location: [facility];  Service: Orthopedics;  Laterality: Right;    Current Medications: Current Outpatient Medications on File Prior to Visit  Medication Sig   acyclovir (ZOVIRAX) 400 MG tablet Take 400 mg by mouth every 8 (eight) hours as needed. Fever blisters   albuterol (VENTOLIN HFA) 108 (90 Base) MCG/ACT inhaler Inhale 2 puffs into the lungs every 6 (six) hours as needed.   amLODipine (NORVASC) 10 MG tablet Take 1 tablet (10 mg total) by mouth daily.    calcium-vitamin D (OSCAL WITH D) 500-200 MG-UNIT tablet Take 1 tablet by mouth daily with breakfast.   carvedilol (COREG) 25 MG tablet Take 1 tablet (25 mg total) by mouth 2 (two) times daily with a meal.   cetirizine (ZYRTEC) 10 MG tablet Take 10 mg by mouth daily.   docusate sodium (COLACE) 100 MG capsule Take 1 capsule (100 mg total) by mouth 2 (two) times daily.   furosemide (LASIX) 20 MG tablet Take 20-40 mg by mouth See admin instructions. Taking 20 mg every Sun., Tues., Thurs., and Sat. Taking 40 mg all other days.   gabapentin (NEURONTIN) 300 MG capsule Take 300 mg by mouth as needed.   hydrALAZINE (APRESOLINE) 25 MG tablet Take 1 tablet by mouth as needed.   irbesartan (AVAPRO) 150 MG tablet Take 1 tablet (150 mg total) by mouth daily.   levothyroxine (SYNTHROID, LEVOTHROID) 150 MCG tablet Take 75-150 mcg by mouth See admin instructions. Pt alternates between 150 mcg and 75 mcg each day with before breakfast   metFORMIN (GLUCOPHAGE-XR) 500 MG 24 hr tablet Take 2 tablets (1,000 mg total) by mouth 2 (two) times daily. (Patient taking differently: Take 1,000 mg by mouth daily with breakfast.)   rosuvastatin (CRESTOR) 10 MG tablet Take 5 mg by mouth daily.   Semaglutide, 1 MG/DOSE, (OZEMPIC, 1 MG/DOSE,) 4 MG/3ML SOPN Inject 1 mg into the skin once a week. Pt take sometimes   spironolactone (ALDACTONE) 25 MG tablet Take 25 mg by mouth daily.   zolpidem (AMBIEN) 10 MG tablet Take 10 mg by mouth at bedtime.   No current facility-administered medications on file prior to visit.     Allergies:   Sulfa antibiotics, Atorvastatin, and Amoxicillin   Social History   Socioeconomic History   Marital status: Married    Spouse name: Not on file   Number of children: Not on file   Years of education: Not on file   Highest education level: Not on file  Occupational History   Not on file  Tobacco Use   Smoking status: Never    Passive exposure: Current (minimal)   Smokeless tobacco: Never   Vaping Use   Vaping Use: Never used  Substance and Sexual Activity   Alcohol use: No   Drug use: No   Sexual activity: Not Currently  Other Topics Concern   Not on file  Social History Narrative   Not on file   Social Determinants of Health   Financial Resource Strain: Not on file  Food Insecurity: Not on file  Transportation Needs: Not on file  Physical Activity: Not on file  Stress: Not on file  Social Connections: Not on file   Former nurse, 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  Family History: The patient's family history includes Blindness in her sister; Diabetes in her mother and sister; Healthy in her son; Heart Problems in her sister; Heart attack in her brother, brother,  father, and mother; Heart failure in her father; Hypertension in her father and sister; Rheum arthritis in her daughter; Throat cancer in her brother.  ROS:   Please see the history of present illness.   (+) Imbalance (+) LE pain Additional pertinent ROS otherwise negative.   EKGs/Labs/Other Studies Reviewed:    The following studies were reviewed today:  CTA Chest  09/25/2021: IMPRESSION: 1. Negative for acute pulmonary embolus. Trace pleural effusions. No focal airspace disease. 2. No CT evidence for acute intra-abdominal or pelvic abnormality. 3. Hepatic cirrhosis.  The spleen appears slightly enlarged 4. Status post cholecystectomy with slight interval enlargement of the common bile duct, correlate with LFTs with follow-up MRCP of suggestive of obstruction 5. Small free fluid in the abdomen and pelvis  RLE Venous Doppler 10/09/2020: Summary:  RIGHT:  - No evidence of deep vein thrombosis in the lower extremity. No indirect  evidence of obstruction proximal to the inguinal ligament.  - No cystic structure found in the popliteal fossa.     LEFT:  - No evidence of common femoral vein obstruction.    Cardiac MRI 09/25/20 IMPRESSION: Asymmetric septal hypertrophy  without other evidence of infiltrative disease and systolic anterior motion of the mitral valve: This can be seen in hypertropic obstructive cardiomyopathy.   Moderate mitral regurgitation.  L Heart Cath 09/23/20   Prox Cx lesion is 20% stenosed.   Mild nonobstructive CAD with tortuous vessels and smooth 20% narrowing in the proximal circumflex far artery.  The LAD appears angiographically normal.  The right coronary artery is a dominant vessel with superior takeoff.   Mild LV to aorta gradient on pullback with a peak to peak gradient at approximately 16 mmHg.   RECOMMENDATION: Medical therapy.  Echo 09/23/20  1. Left ventricular ejection fraction, by estimation, is 65 to 70%. The  left ventricle has normal function. The left ventricle has no regional  wall motion abnormalities. Left ventricular diastolic parameters are  consistent with Grade II diastolic  dysfunction (pseudonormalization).   2. Right ventricular systolic function is normal. The right ventricular  size is normal.   3. Left atrial size was mildly dilated.   4. There is systolic anterior motion of the MV leaflet. . The mitral  valve is normal in structure. Moderate mitral valve regurgitation.   5. The aortic valve is normal in structure. Aortic valve regurgitation is  not visualized.   Monitor 02/23/18 3 days of data recorded on Zio monitor. Patient had a min HR of 58 bpm, max HR of 129 bpm, and avg HR of 75 bpm. Predominant underlying rhythm was Sinus Rhythm. No VT, SVT, atrial fibrillation, high degree block, or pauses noted. Isolated atrial ectopy was rare (<1%). Overall PVC burden was 4.8%, with rare couplets. Longest ventricular bigeminy episode was 11 seconds, and longest ventricular trigeminy episode was 1 minute 50 seconds. There were no patient triggered events.   Lexiscan 02/23/18 Nuclear stress EF: 63%. The study is normal. This is a low risk study. The left ventricular ejection fraction is normal (55-65%).    Normal stress nuclear study with no ischemia or infarction; EF 63 with normal wall motion.   Echo 2014 Left ventricle: The cavity size was normal. Wall thickness   was increased in a pattern of moderate LVH. Systolic   function was vigorous. The estimated ejection fraction was   in the range of 65% to 70%. Wall motion was normal; there   were no regional wall motion abnormalities. - Mitral valve: Mild regurgitation.  EKG:  EKG is personally reviewed. 12/02/2021:  EKG was not ordered. 05/08/21: NSR at 70 bpm 02/08/18: normal sinus rhythm, borderline LVH criteria  Recent Labs: 09/25/2021: B Natriuretic Peptide 68.2; Hemoglobin 11.0; Platelets 92 11/20/2021: ALT 41; BUN 12; Creat 0.70; Potassium 3.9; Sodium 142  Recent Lipid Panel    Component Value Date/Time   CHOL 116 09/23/2020 0210   TRIG 24 09/23/2020 0210   HDL 48 09/23/2020 0210   CHOLHDL 2.4 09/23/2020 0210   VLDL 5 09/23/2020 0210   LDLCALC 63 09/23/2020 0210    Physical Exam:    VS:  BP (!) 162/78 (BP Location: Right Arm, Patient Position: Sitting, Cuff Size: Normal)   Pulse 74   Ht 5' 2.5" (1.588 m)   Wt 169 lb 8 oz (76.9 kg)   SpO2 98%   BMI 30.51 kg/m     Wt Readings from Last 3 Encounters:  12/02/21 169 lb 8 oz (76.9 kg)  11/20/21 165 lb 6.4 oz (75 kg)  11/04/21 164 lb 8 oz (74.6 kg)     GEN: Well nourished, well developed in no acute distress HEENT: Normal NECK: No JVD; No carotid bruits LYMPHATICS: No lymphadenopathy CARDIAC: regular rhythm, normal S1 and S2, no rubs, gallops. 2/6 holosytolic murmur, does not change with valsalva. Radial and DP pulses 2+ bilaterally. RESPIRATORY:  Clear to auscultation without rales, wheezing or rhonchi  ABDOMEN: Soft, non-tender, non-distended MUSCULOSKELETAL:  No edema; No deformity  SKIN: Warm and dry NEUROLOGIC:  Alert and oriented x 3 PSYCHIATRIC:  Normal affect   ASSESSMENT:    1. Essential hypertension   2. Nonocclusive coronary atherosclerosis of native  coronary artery   3. LVH (left ventricular hypertrophy)   4. Systolic anterior movement of mitral valve   5. Nonrheumatic mitral valve regurgitation   6. Therapeutic drug monitoring   7. Positive ANA (antinuclear antibody)     PLAN:    Labile hypertension -home blood pressure monitoring reviewed. Better readings at home than in office but average still above goal -continue amlodipine 10 mg daily, carvedilol 25 mg BID, furosemide 20-40 mg based on the day, irbesartan 150 mg daily -has been ordered for spironolactone but hasn't started yet. Last Cr/K normal. Recommended starting spironolactone, monitor BP, recheck BMET in 3 weeks. -we discussed stopping hydralazine given abnormal ANA. She only needs this very rarely. She is now being followed by Dr. Corliss Skains in rheumatology, had a positive ANA at 1:1280 with homogenous nuclear pattern. This could be SLE or drug induced lupus based on this. Defer to Dr. Corliss Skains, but would not use hydralazine unless blood pressure severely elevated until we have a better understanding of her ANA  Nonobstructive CAD by cath Type II diabetes -no angina -continue aspirin, rosuvastatin -on semaglutide (GLP1RA)  Mild aortic stenosis Moderate mitral regurgitation, systolic anterior motion of the mitral valve with asymmetric septal hypertrophy -murmur does not change with valsalva -heart rate is 70s-80s -given SAM/LVOT flow, increased carvedilol, now doing great without issues. -continue furosemide, irbesartan. Avoid dehydration/hyperdynamic LV given septal hypertrophy/SAM -no syncope  Plan for follow up: 6-8 weeks, or sooner as needed.  Medication Adjustments/Labs and Tests Ordered: Current medicines are reviewed at length with the patient today.  Concerns regarding medicines are outlined above.   Orders Placed This Encounter  Procedures   Basic metabolic panel   No orders of the defined types were placed in this encounter.  Patient Instructions   Medication Instructions:  START SPIRONOLACTONE DAILY AS RECOMMENDED   *If you need a  refill on your cardiac medications before your next appointment, please call your pharmacy*  Lab Work: BMET IN 3 WEEK S  If you have labs (blood work) drawn today and your tests are completely normal, you will receive your results only by: MyChart Message (if you have MyChart) OR A paper copy in the mail If you have any lab test that is abnormal or we need to change your treatment, we will call you to review the results.  Testing/Procedures: NONE  Follow-Up: At [facility], you and your health needs are our priority.  As part of our continuing mission to provide you with exceptional heart care, we have created designated Provider Care Teams.  These Care Teams include your primary Cardiologist (physician) and Advanced Practice Providers (APPs -  Physician Assistants and Nurse Practitioners) who all work together to provide you with the care you need, when you need it.  We recommend signing up for the patient portal called "MyChart".  Sign up information is provided on this After Visit Summary.  MyChart is used to connect with patients for Virtual Visits (Telemedicine).  Patients are able to view lab/test results, encounter notes, upcoming appointments, etc.  Non-urgent messages can be sent to your provider as well.   To learn more about what you can do with MyChart, go to [email or web address].    Your next appointment:   6 week(s)  The format for your next appointment:   In Person  Provider:   Jodelle RedBridgette Baeleigh Devincent, MD           [facility],Mathew Stumpf,acting as a scribe for Jodelle RedBridgette Yamen Castrogiovanni, MD.,have documented all relevant documentation on the behalf of Jodelle RedBridgette Consuelo Thayne, MD,as directed by  Jodelle RedBridgette Oswin Johal, MD while in the presence of Jodelle RedBridgette Jquan Egelston, MD.  I, Jodelle RedBridgette Dorathy Stallone, MD, have reviewed all documentation for this visit. The documentation on 12/02/21 for  the exam, diagnosis, procedures, and orders are all accurate and complete.   Signed, Jodelle RedBridgette Alyx Gee, MD PhD 12/02/2021 7:32 PM    [facility]

## 2021-12-02 NOTE — Patient Instructions (Addendum)
Medication Instructions:  START SPIRONOLACTONE DAILY AS RECOMMENDED   *If you need a refill on your cardiac medications before your next appointment, please call your pharmacy*  Lab Work: BMET IN 3 WEEK S  If you have labs (blood work) drawn today and your tests are completely normal, you will receive your results only by: MyChart Message (if you have MyChart) OR A paper copy in the mail If you have any lab test that is abnormal or we need to change your treatment, we will call you to review the results.  Testing/Procedures: NONE  Follow-Up: At [facility], you and your health needs are our priority.  As part of our continuing mission to provide you with exceptional heart care, we have created designated Provider Care Teams.  These Care Teams include your primary Cardiologist (physician) and Advanced Practice Providers (APPs -  Physician Assistants and Nurse Practitioners) who all work together to provide you with the care you need, when you need it.  We recommend signing up for the patient portal called "MyChart".  Sign up information is provided on this After Visit Summary.  MyChart is used to connect with patients for Virtual Visits (Telemedicine).  Patients are able to view lab/test results, encounter notes, upcoming appointments, etc.  Non-urgent messages can be sent to your provider as well.   To learn more about what you can do with MyChart, go to [email or web address].    Your next appointment:   6 week(s)  The format for your next appointment:   In Person  Provider:   Jodelle Red, MD

## 2021-12-08 NOTE — Progress Notes (Signed)
Office Visit Note  Patient: [name]             Date of Birth: 1950-02-08           MRN: [id number]             PCP: Jilda Panda, MD Referring: Jilda Panda, MD Visit Date: 12/22/2021 Occupation: _0 @  Subjective:  Pain in multiple joints  History of Present Illness: JASIEL APACHITO is a 71 y.o. female with history of osteoarthritis and positive ANA.  She continues to have pain and discomfort in her bilateral hands, knee joints and cervical spine.  She states she had cervical spine [facility] which was helpful for some time.  She still has a stiffness with range of motion of her cervical spine.  She has unstable gait due to osteoarthritis in her knee joints and discomfort.  She states she lives in constant discomfort.  She was evaluated by hematology for pancytopenia.  She continues to have dry mouth and dry eyes.  She denies any history of oral ulcers, nasal ulcers, malar rash, photosensitivity, Raynaud's phenomenon or lymphadenopathy.  Activities of Daily Living:  Patient reports morning stiffness for 4 hours.   Patient Reports nocturnal pain.  Difficulty dressing/grooming: Denies Difficulty climbing stairs: Reports Difficulty getting out of chair: Reports Difficulty using hands for taps, buttons, cutlery, and/or writing: Denies  Review of Systems  Constitutional:  Positive for fatigue.  HENT:  Positive for mouth dryness. Negative for mouth sores.   Eyes:  Positive for dryness.  Respiratory:  Negative for difficulty breathing.   Cardiovascular:  Negative for chest pain and palpitations.  Gastrointestinal:  Negative for blood in stool, constipation and diarrhea.  Endocrine: Negative for increased urination.  Genitourinary:  Negative for involuntary urination.  Musculoskeletal:  Positive for joint pain, joint pain, myalgias, muscle weakness, morning stiffness, muscle tenderness and myalgias. Negative for gait problem and joint swelling.  Skin:   Positive for hair loss. Negative for color change, rash and sensitivity to sunlight.  Allergic/Immunologic: Negative for susceptible to infections.  Neurological:  Positive for headaches. Negative for dizziness.  Hematological:  Positive for bruising/bleeding tendency. Negative for swollen glands.  Psychiatric/Behavioral:  Positive for sleep disturbance. Negative for depressed mood. The patient is nervous/anxious.     PMFS History:  Patient Active Problem List   Diagnosis Date Noted   Arthropathy of cervical facet joint 37/04/8887   Diastolic dysfunction 16/94/5038   Other cirrhosis of liver ([place]) 11/20/2021   S/P total knee arthroplasty, right 88/28/0034   Systolic anterior movement of mitral valve 03/30/2021   Chest pain 09/23/2020   Thrombocytopenia ([place]) 09/23/2020   Dehydration 06/22/2012   AKI (acute kidney injury) ([place]) 06/22/2012   Hypothyroidism 06/22/2012   HTN (hypertension) 06/22/2012   Uncontrolled type 2 diabetes mellitus with hyperglycemia ([place]) 06/22/2012   Sarcoidosis 06/22/2012   Heart murmur 06/22/2012   UTI (urinary tract infection) 06/22/2012    Past Medical History:  Diagnosis Date   Arthritis    Asthma    CAD (coronary artery disease)    CHF (congestive heart failure) (HCC)    Complication of anesthesia    trouble waking up once   COPD (chronic obstructive pulmonary disease) ([place])    Diabetes mellitus without complication ([place])    Dysrhythmia    Heart murmur    Hypertension    Hypothyroidism    Liver disease    NAFLD cirrhosis   Mild aortic stenosis    Pneumonia  Sarcoidosis    Sarcoidosis of lung ([place])    Sarcoidosis of lymph nodes     Family History  Problem Relation Age of Onset   Diabetes Mother    Heart attack Mother    Heart failure Father    Hypertension Father    Heart attack Father    Heart Problems Sister    Blindness Sister    Diabetes Sister    Hypertension Sister    Heart attack Brother    Heart attack Brother     Throat cancer Brother    Healthy Son    Rheum arthritis Daughter    Past Surgical History:  Procedure Laterality Date   ABDOMINAL HYSTERECTOMY     ABDOMINAL SURGERY     CHOLECYSTECTOMY     ESOPHAGOGASTRODUODENOSCOPY (EGD) WITH PROPOFOL N/A 04/17/2021   Procedure: ESOPHAGOGASTRODUODENOSCOPY (EGD) WITH PROPOFOL;  Surgeon: Carol Ada, MD;  Location: [facility];  Service: Gastroenterology;  Laterality: N/A;   HERNIA REPAIR     LASIK  2001   LEFT HEART CATH AND CORONARY ANGIOGRAPHY N/A 09/23/2020   Procedure: LEFT HEART CATH AND CORONARY ANGIOGRAPHY;  Surgeon: Troy Sine, MD;  Location: [facility];  Service: Cardiovascular;  Laterality: N/A;   nissenfundiplication     TOTAL KNEE ARTHROPLASTY Right 05/19/2021   Procedure: TOTAL KNEE ARTHROPLASTY;  Surgeon: Paralee Cancel, MD;  Location: [facility];  Service: Orthopedics;  Laterality: Right;   Social History   Social History Narrative   Not on file   Immunization History  Administered Date(s) Administered   Fluad Quad(high Dose 65+) 11/23/2020     Objective: Vital Signs: BP (!) 148/65 (BP Location: Left Arm, Patient Position: Sitting, Cuff Size: Normal)   Pulse 67   Resp 15   Ht 5' 2.5" (1.588 m)   Wt 171 lb (77.6 kg)   BMI 30.78 kg/m    Physical Exam Vitals and nursing note reviewed.  Constitutional:      Appearance: She is well-developed.  HENT:     Head: Normocephalic and atraumatic.  Eyes:     Conjunctiva/sclera: Conjunctivae normal.  Cardiovascular:     Rate and Rhythm: Normal rate and regular rhythm.     Heart sounds: Normal heart sounds.  Pulmonary:     Effort: Pulmonary effort is normal.     Breath sounds: Normal breath sounds.  Abdominal:     General: Bowel sounds are normal.     Palpations: Abdomen is soft.  Musculoskeletal:     Cervical back: Normal range of motion.  Lymphadenopathy:     Cervical: No cervical adenopathy.  Skin:    General: Skin is warm and dry.     Capillary Refill:  Capillary refill takes less than 2 seconds.  Neurological:     Mental Status: She is alert and oriented to person, place, and time.  Psychiatric:        Behavior: Behavior normal.      Musculoskeletal Exam: She had limited range of motion of the cervical spine.  Shoulder joints and elbow joints in good range of motion.  She had bilateral PIP and DIP thickening.  No synovitis was noted.  Hip joints were in good range of motion.  Right knee joint was replaced in had limited extension.  Left knee joint was in good range of motion without any swelling or warmth.  There was no tenderness over ankles or MTPs.  CDAI Exam: CDAI Score: -- Patient Global: --; Provider Global: -- Swollen: --; Tender: -- Joint Exam  12/22/2021   No joint exam has been documented for this visit   There is currently no information documented on the homunculus. Go to the Rheumatology activity and complete the homunculus joint exam.  Investigation: No additional findings.  Imaging: No results found.  Recent Labs: Lab Results  Component Value Date   WBC 3.2 (L) 12/16/2021   HGB 11.5 (L) 12/16/2021   PLT 95 (L) 12/16/2021   NA 140 12/16/2021   K 3.8 12/16/2021   CL 105 12/16/2021   CO2 30 12/16/2021   GLUCOSE 115 (H) 12/16/2021   BUN 13 12/16/2021   CREATININE 0.79 12/16/2021   BILITOT 0.5 12/16/2021   ALKPHOS 130 (H) 12/16/2021   AST 35 12/16/2021   ALT 25 12/16/2021   PROT 8.1 12/16/2021   ALBUMIN 3.9 12/16/2021   CALCIUM 10.6 (H) 12/16/2021   GFRAA >60 02/07/2018   11/03/2023ANA 1: 1280 NH, SSA negative, SSB negative, C3-C4 normal, urine protein creatinine ratio normal, RF negative, ACE 100, CK25  04/28/21:ANA1:1280NH, chromatin 3.8, dsDNA 3, anti-CCP<16, RNP-, Sm-, ESR 14   Speciality Comments: No specialty comments available.  Procedures:  No procedures performed Allergies: Sulfa antibiotics, Atorvastatin, and Amoxicillin   Assessment / Plan:     Visit Diagnoses: Polyarthralgia - History  of migratory polyarthralgia involving her shoulders, elbows, wrists, hands, knees and her feet.  No synovitis was noted.  Pain in both hands - History of pain in bilateral hands.  Note synovitis was noted.  X-rays are consistent with osteoarthritis and generalized osteopenia.  X-ray findings were discussed with the patient.  I advised patient that if she develops any swelling she should notify us.  Joint protection muscle strengthening was discussed.  A handout on hand exercises was given.  S/P total knee arthroplasty, right - May 19, 2021 by Dr. Alvan Dame.  She had limited extension.  Patient has instability with her gait.- Plan: Ambulatory referral to Physical Therapy  Primary osteoarthritis of left knee - History of chronic pain in her left knee joint.  No warmth swelling or effusion was noted.  I will refer her to physical therapy as she has unstable gait and arthritis in her bilateral knee joints.  She states she did well after physical therapy in the past.- Plan: Ambulatory referral to Physical Therapy  Unstable gait -will refer to physical therapy.  Plan: Ambulatory referral to Physical Therapy  Arthropathy of cervical facet joint - She had a good response to ablation procedure.  She has limited range of motion of the cervical spine with some discomfort.  A handout on cervical spine exercises was given.  Positive ANA (antinuclear antibody) - Significantly high titer of ANA.  ENA negative, C3-C4 normal.  There is no history of oral ulcers, nasal ulcers, malar rash, photosensitivity, Raynaud's -I will repeat labs in 6 months.  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  Patient had no pulmonary symptoms.  Chest x-ray was normal recently.  ACE level elevated.  She has no features of active sarcoidosis at this  point.  Myalgia - History of myalgias and lower extremity posterior to knee joint arthritis.  CK low.  Lab values were discussed with the patient.  Other medical problems listed as follows:  Heart murmur - Aortic stenosis and moderate mitral regurgitation.  Systolic anterior movement of mitral valve  Diastolic dysfunction - By Dr. Harrell Gave  Primary hypertension  Thrombocytopenia ([place]) - She appears to have pancytopenia.  She was referred to hematology for evaluation.  Dyslipidemia  Uncontrolled type 2 diabetes mellitus with hyperglycemia (HCC)  Other cirrhosis of liver ([place]) - Followed by Dr. Collene Mares  History of hiatal hernia  History of hypothyroidism  Orders: Orders Placed This Encounter  Procedures   Protein / creatinine ratio, urine   CBC with Differential/Platelet   COMPLETE METABOLIC PANEL WITH GFR   Anti-DNA antibody, double-stranded   C3 and C4   ANA   Sedimentation rate   Ambulatory referral to Physical Therapy   No orders of the defined types were placed in this encounter.    Follow-Up Instructions: Return in about 6 months (around 06/23/2022) for Osteoarthritis, +ANA, Sarcoidosis.   Bo Merino, MD  Note - This record has been created using Editor, commissioning.  Chart creation errors have been sought, but may not always  have been located. Such creation errors do not reflect on  the standard of medical care.

## 2021-12-16 ENCOUNTER — Inpatient Hospital Stay: Payer: Medicare Other

## 2021-12-16 ENCOUNTER — Inpatient Hospital Stay: Payer: Medicare Other | Attending: Hematology and Oncology | Admitting: Hematology and Oncology

## 2021-12-16 ENCOUNTER — Other Ambulatory Visit: Payer: Self-pay

## 2021-12-16 VITALS — BP 158/63 | HR 64 | Temp 97.9°F | Resp 15 | Wt 170.5 lb

## 2021-12-16 DIAGNOSIS — E119 Type 2 diabetes mellitus without complications: Secondary | ICD-10-CM | POA: Diagnosis not present

## 2021-12-16 DIAGNOSIS — K760 Fatty (change of) liver, not elsewhere classified: Secondary | ICD-10-CM | POA: Insufficient documentation

## 2021-12-16 DIAGNOSIS — I110 Hypertensive heart disease with heart failure: Secondary | ICD-10-CM | POA: Insufficient documentation

## 2021-12-16 DIAGNOSIS — K7460 Unspecified cirrhosis of liver: Secondary | ICD-10-CM | POA: Diagnosis not present

## 2021-12-16 DIAGNOSIS — D869 Sarcoidosis, unspecified: Secondary | ICD-10-CM | POA: Insufficient documentation

## 2021-12-16 DIAGNOSIS — D696 Thrombocytopenia, unspecified: Secondary | ICD-10-CM | POA: Diagnosis not present

## 2021-12-16 DIAGNOSIS — Z79899 Other long term (current) drug therapy: Secondary | ICD-10-CM | POA: Diagnosis not present

## 2021-12-16 DIAGNOSIS — I2510 Atherosclerotic heart disease of native coronary artery without angina pectoris: Secondary | ICD-10-CM | POA: Diagnosis not present

## 2021-12-16 DIAGNOSIS — J4489 Other specified chronic obstructive pulmonary disease: Secondary | ICD-10-CM | POA: Insufficient documentation

## 2021-12-16 LAB — CMP (CANCER CENTER ONLY)
ALT: 25 U/L (ref 0–44)
AST: 35 U/L (ref 15–41)
Albumin: 3.9 g/dL (ref 3.5–5.0)
Alkaline Phosphatase: 130 U/L — ABNORMAL HIGH (ref 38–126)
Anion gap: 5 (ref 5–15)
BUN: 13 mg/dL (ref 8–23)
CO2: 30 mmol/L (ref 22–32)
Calcium: 10.6 mg/dL — ABNORMAL HIGH (ref 8.9–10.3)
Chloride: 105 mmol/L (ref 98–111)
Creatinine: 0.79 mg/dL (ref 0.44–1.00)
GFR, Estimated: >60 mL/min (ref >60)
Glucose, Bld: 115 mg/dL — ABNORMAL HIGH (ref 70–99)
Potassium: 3.8 mmol/L (ref 3.5–5.1)
Sodium: 140 mmol/L (ref 135–145)
Total Bilirubin: 0.5 mg/dL (ref 0.3–1.2)
Total Protein: 8.1 g/dL (ref 6.5–8.1)

## 2021-12-16 LAB — CBC WITH DIFFERENTIAL (CANCER CENTER ONLY)
Abs Immature Granulocytes: 0.01 10*3/uL (ref 0.00–0.07)
Basophils Absolute: 0 10*3/uL (ref 0.0–0.1)
Basophils Relative: 1 %
Eosinophils Absolute: 0.2 10*3/uL (ref 0.0–0.5)
Eosinophils Relative: 5 %
HCT: 36.4 % (ref 36.0–46.0)
Hemoglobin: 11.5 g/dL — ABNORMAL LOW (ref 12.0–15.0)
Immature Granulocytes: 0 %
Lymphocytes Relative: 19 %
Lymphs Abs: 0.6 10*3/uL — ABNORMAL LOW (ref 0.7–4.0)
MCH: 26.7 pg (ref 26.0–34.0)
MCHC: 31.6 g/dL (ref 30.0–36.0)
MCV: 84.5 fL (ref 80.0–100.0)
Monocytes Absolute: 0.2 10*3/uL (ref 0.1–1.0)
Monocytes Relative: 7 %
Neutro Abs: 2.2 10*3/uL (ref 1.7–7.7)
Neutrophils Relative %: 68 %
Platelet Count: 95 10*3/uL — ABNORMAL LOW (ref 150–400)
RBC: 4.31 MIL/uL (ref 3.87–5.11)
RDW: 14 % (ref 11.5–15.5)
WBC Count: 3.2 10*3/uL — ABNORMAL LOW (ref 4.0–10.5)
nRBC: 0 % (ref 0.0–0.2)

## 2021-12-16 LAB — VITAMIN B12: Vitamin B-12: 247 pg/mL (ref 180–914)

## 2021-12-16 LAB — IMMATURE PLATELET FRACTION: Immature Platelet Fraction: 12.4 % — ABNORMAL HIGH (ref 1.2–8.6)

## 2021-12-16 LAB — FOLATE: Folate: 22.4 ng/mL (ref >5.9)

## 2021-12-16 NOTE — Progress Notes (Signed)
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 Lorenso Courier   CHIEF COMPLAINTS/PURPOSE OF CONSULTATION:  "Thrombocytopenia "  HISTORY OF PRESENTING ILLNESS:  [name] 71 y.o. female with medical history significant for nonalcoholic fatty liver disease, sarcoidosis, congestive heart failure, COPD, and type 2 diabetes who presents for evaluation of thrombocytopenia.  On review of the previous records [name] underwent a CT scan of the abdomen on 09/25/2021.  That scan noted that she had cirrhotic morphology of the liver as well as a spleen enlarged at 14 cm.  Her platelets were last normal on 02/07/2018 at which time she had a hemoglobin of 13.3 with a platelet count of 165.  Most recently on 09/25/2021 patient had a white blood cell count 3.9, hemoglobin 11.0, MCV 85.1, and platelets of 92.  On exam today [name] is accompanied by her husband.  She reports that she does have some occasional nosebleeds that occur approximately once or twice per month.  She reports that the only last about 5 minutes and are quite easy to stop.  She also does have some occasional gum bleeding.  She notes that she does pass some hard stools on occasion with some rare bright red blood.  She notes that she is a vegetarian and has been so for 45 years.  She does eat however eggs, milk, and cheese.  She has had no recent issues with infections or infectious symptoms.  She denies any easy bruising.  On further discussion she reports that her family history is remarkable for  prostate cancer in her father and a blood clot.  She reports her mother had breast cancer and her brother had head neck cancer.  She has 2 healthy children.  She reports that she is a never smoker and does not drink.  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  She notes that she follows rheumatology for joint pains.  She follows with Dr. Collene Mares for her cirrhosis.  She otherwise denies any fevers, chills, sweats, nausea, vomiting or diarrhea.  A full 10 point ROS was otherwise negative.  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     SURGICAL HISTORY: Past Surgical History:  Procedure Laterality Date   ABDOMINAL HYSTERECTOMY     ABDOMINAL SURGERY     CHOLECYSTECTOMY     ESOPHAGOGASTRODUODENOSCOPY (EGD) WITH PROPOFOL N/A 04/17/2021   Procedure: ESOPHAGOGASTRODUODENOSCOPY (EGD) WITH PROPOFOL;  Surgeon: Carol Ada, MD;  [facility];  Service: Gastroenterology;  Laterality: N/A;   HERNIA REPAIR     LASIK  2001   LEFT HEART CATH AND CORONARY ANGIOGRAPHY N/A 09/23/2020   Procedure: LEFT HEART CATH AND CORONARY ANGIOGRAPHY;  Surgeon: Troy Sine, MD;  Location: [facility];  Service: Cardiovascular;  Laterality: N/A;   nissenfundiplication     TOTAL KNEE ARTHROPLASTY Right 05/19/2021   Procedure: TOTAL KNEE ARTHROPLASTY;  Surgeon: Paralee Cancel, MD;  Location: [facility];  Service: Orthopedics;  Laterality: Right;    SOCIAL HISTORY: Social History   Socioeconomic History    Marital status: Married    Spouse name: Not on file   Number of children: Not on file   Years of education: Not on file   Highest education level: Not on file  Occupational History   Not on file  Tobacco Use   Smoking status: Never    Passive exposure: Current (minimal)   Smokeless tobacco: Never  Vaping Use   Vaping Use: Never used  Substance and Sexual Activity   Alcohol use: No   Drug use: No   Sexual activity: Not Currently  Other Topics Concern   Not on file  Social History Narrative   Not on file   Social Determinants of Health   Financial Resource Strain: Not on file  Food Insecurity: Not on file  Transportation Needs: Not on file  Physical Activity: Not on file  Stress: Not on file  Social Connections: Not on file  Intimate Partner Violence: Not on file    FAMILY HISTORY: Family History  Problem Relation Age of Onset   Diabetes Mother    Heart attack Mother    Heart failure Father    Hypertension Father    Heart attack Father    Heart Problems Sister    Blindness Sister    Diabetes Sister    Hypertension Sister    Heart attack Brother    Heart attack Brother    Throat cancer Brother    Healthy Son    Rheum arthritis Daughter     ALLERGIES:  is allergic to sulfa antibiotics, atorvastatin, and amoxicillin.  MEDICATIONS:  Current Outpatient Medications  Medication Sig Dispense Refill   acyclovir (ZOVIRAX) 400 MG tablet Take 400 mg by mouth every 8 (eight) hours as needed. Fever blisters     albuterol (VENTOLIN HFA) 108 (90 Base) MCG/ACT inhaler Inhale 2 puffs into the lungs every 6 (six) hours as needed. 18 g 5   amLODipine (NORVASC) 10 MG tablet Take 1 tablet (10 mg total) by mouth daily. 90 tablet 3   calcium-vitamin D (OSCAL WITH D) 500-200 MG-UNIT tablet Take 1 tablet by mouth daily with breakfast.     carvedilol (COREG) 25 MG tablet Take 1 tablet (25 mg total) by mouth 2 (two) times daily with a meal. 180 tablet 3   cetirizine (ZYRTEC) 10 MG  tablet Take 10 mg by mouth daily.     docusate sodium (COLACE) 100 MG capsule Take 1 capsule (100 mg total) by mouth 2 (two) times daily. 10 capsule 0   furosemide (LASIX) 20 MG tablet Take 20-40 mg by mouth See admin instructions. Taking 20 mg every Sun., Tues., Thurs., and Sat. Taking 40 mg all other days.     gabapentin (NEURONTIN) 300 MG capsule Take 300 mg by mouth as needed.     hydrALAZINE (APRESOLINE) 25 MG tablet Take 1 tablet by mouth as needed.     irbesartan (AVAPRO) 150 MG tablet Take 1 tablet (  150 mg total) by mouth daily. 30 tablet 11   levothyroxine (SYNTHROID, LEVOTHROID) 150 MCG tablet Take 75-150 mcg by mouth See admin instructions. Pt alternates between 150 mcg and 75 mcg each day with before breakfast     metFORMIN (GLUCOPHAGE-XR) 500 MG 24 hr tablet Take 2 tablets (1,000 mg total) by mouth 2 (two) times daily. (Patient taking differently: Take 1,000 mg by mouth daily with breakfast.) 120 tablet 3   rosuvastatin (CRESTOR) 10 MG tablet Take 5 mg by mouth daily.     Semaglutide, 1 MG/DOSE, (OZEMPIC, 1 MG/DOSE,) 4 MG/3ML SOPN Inject 1 mg into the skin once a week. Pt take sometimes     spironolactone (ALDACTONE) 25 MG tablet Take 25 mg by mouth daily.     zolpidem (AMBIEN) 10 MG tablet Take 10 mg by mouth at bedtime.     No current facility-administered medications for this visit.    REVIEW OF SYSTEMS:   Constitutional: ( - ) fevers, ( - )  chills , ( - ) night sweats Eyes: ( - ) blurriness of vision, ( - ) double vision, ( - ) watery eyes Ears, nose, mouth, throat, and face: ( - ) mucositis, ( - ) sore throat Respiratory: ( - ) cough, ( - ) dyspnea, ( - ) wheezes Cardiovascular: ( - ) palpitation, ( - ) chest discomfort, ( - ) lower extremity swelling Gastrointestinal:  ( - ) nausea, ( - ) heartburn, ( - ) change in bowel habits Skin: ( - ) abnormal skin rashes Lymphatics: ( - ) new lymphadenopathy, ( - ) easy bruising Neurological: ( - ) numbness, ( - ) tingling, ( - )  new weaknesses Behavioral/Psych: ( - ) mood change, ( - ) new changes  All other systems were reviewed with the patient and are negative.  PHYSICAL EXAMINATION:  Vitals:   12/16/21 0901  BP: (!) 158/63  Pulse: 64  Resp: 15  Temp: 97.9 F (36.6 C)  SpO2: 98%   Filed Weights   12/16/21 0901  Weight: 170 lb 8 oz (77.3 kg)    GENERAL: well appearing elderly Caucasian female in NAD  SKIN: skin color, texture, turgor are normal, no rashes or significant lesions EYES: conjunctiva are pink and non-injected, sclera clear LUNGS: clear to auscultation and percussion with normal breathing effort HEART: regular rate & rhythm and no murmurs and no lower extremity edema Musculoskeletal: no cyanosis of digits and no clubbing  PSYCH: alert & oriented x 3, fluent speech NEURO: no focal motor/sensory deficits  LABORATORY DATA:  I have reviewed the data as listed    Latest Ref Rng & Units 12/16/2021    9:57 AM 09/25/2021    5:59 PM 05/20/2021    3:15 AM  CBC  WBC 4.0 - 10.5 K/uL 3.2  3.9  7.1   Hemoglobin 12.0 - 15.0 g/dL 11.5  11.0  9.4   Hematocrit 36.0 - 46.0 % 36.4  34.8  29.7   Platelets 150 - 400 K/uL 95  92  93        Latest Ref Rng & Units 12/16/2021    9:57 AM 11/20/2021    9:14 AM 09/25/2021    9:31 PM  CMP  Glucose 70 - 99 mg/dL 115  98    BUN 8 - 23 mg/dL 13  12    Creatinine 0.44 - 1.00 mg/dL 0.79  0.70    Sodium 135 - 145 mmol/L 140  142    Potassium 3.5 -  5.1 mmol/L 3.8  3.9    Chloride 98 - 111 mmol/L 105  106    CO2 22 - 32 mmol/L 30  29    Calcium 8.9 - 10.3 mg/dL 10.6  9.8    Total Protein 6.5 - 8.1 g/dL 8.1  7.4  6.9   Total Bilirubin 0.3 - 1.2 mg/dL 0.5  0.6  0.8   Alkaline Phos 38 - 126 U/L 130   112   AST 15 - 41 U/L 35  45  39   ALT 0 - 44 U/L 25  41  35      ASSESSMENT & PLAN [name] 71 y.o. female with medical history significant for nonalcoholic fatty liver disease, sarcoidosis, congestive heart failure, COPD, and type 2 diabetes who presents for  evaluation of thrombocytopenia.  After review of the labs, review of the records, and discussion with the patient the patients findings are most consistent with thrombocytosis 2/2 to cirrhosis.   Thrombocytopenia is a common condition with a broad differential. The possible etiologies of thrombocytopenia include liver disease, splenomegaly, infectious process, nutritional deficiency, consumption/autoimmune destruction, pseudothrombocytopenia, and bone marrow disorders. Cirrhosis and liver disease the most common causes of moderate thrombocytopenia. Evaluation should include full hepatitis serologies (Hep B and C) as well as HIV. Imaging of the liver spleen should be performed with an abdominal US ( if prior imaging is no readily available). Nutritional etiologies should be ruled out with Vitamin b12 and folate testing.  A peripheral blood smear can help determine if there is clumping leading to pseudothrombocytopenia. If no clear etiology can be found would need to consider immune thrombocytopenia (ITP) with consideration of bone marrow biopsy.   #Thrombocytopenia in Setting of Cirrhosis --will order CBC, CMP, Vitamin b12 and folate.   --will review peripheral blood film to r/o clumping if concern for clumping on CBC. -- Patient follows with Dr. Collene Mares in gastroenterology for her cirrhosis.  Will defer hepatitis testing as this is likely been done for her cirrhosis workup. --RTC pending the results of the above studies.  If no abnormalities in her labs then her findings are most likely secondary to cirrhosis.  Would recommend continuing to follow-up with gastroenterology.    Orders Placed This Encounter  Procedures   CBC with Differential ([place] Only)    Standing Status:   Future    Number of Occurrences:   1    Standing Expiration Date:   12/17/2022   CMP ([place] only)    Standing Status:   Future    Number of Occurrences:   1    Standing Expiration Date:   12/17/2022   Immature  Platelet Fraction    Standing Status:   Future    Number of Occurrences:   1    Standing Expiration Date:   12/17/2022   Vitamin B12    Standing Status:   Future    Number of Occurrences:   1    Standing Expiration Date:   12/16/2022   Methylmalonic acid, serum    Standing Status:   Future    Number of Occurrences:   1    Standing Expiration Date:   12/16/2022   Folate, Serum    Standing Status:   Future    Number of Occurrences:   1    Standing Expiration Date:   12/16/2022    All questions were answered. The patient knows to call the clinic with any problems, questions or concerns.  A total of  more than 60 minutes were spent on this encounter with face-to-face time and non-face-to-face time, including preparing to see the patient, ordering tests and/or medications, counseling the patient and coordination of care as outlined above.   Ledell Peoples, MD Department of Hematology/Oncology [place] at [facility] Phone: [phone] Pager: [phone] Email: [email or web address]  12/16/2021 6:30 PM

## 2021-12-18 LAB — METHYLMALONIC ACID, SERUM: Methylmalonic Acid, Quantitative: 351 nmol/L (ref 0–378)

## 2021-12-22 ENCOUNTER — Encounter: Payer: Self-pay | Admitting: Rheumatology

## 2021-12-22 ENCOUNTER — Ambulatory Visit: Payer: Medicare Other | Attending: Rheumatology | Admitting: Rheumatology

## 2021-12-22 VITALS — BP 148/65 | HR 67 | Resp 15 | Ht 62.5 in | Wt 171.0 lb

## 2021-12-22 DIAGNOSIS — I3489 Other nonrheumatic mitral valve disorders: Secondary | ICD-10-CM

## 2021-12-22 DIAGNOSIS — I5189 Other ill-defined heart diseases: Secondary | ICD-10-CM

## 2021-12-22 DIAGNOSIS — M2550 Pain in unspecified joint: Secondary | ICD-10-CM | POA: Diagnosis not present

## 2021-12-22 DIAGNOSIS — M1712 Unilateral primary osteoarthritis, left knee: Secondary | ICD-10-CM

## 2021-12-22 DIAGNOSIS — D696 Thrombocytopenia, unspecified: Secondary | ICD-10-CM

## 2021-12-22 DIAGNOSIS — K7469 Other cirrhosis of liver: Secondary | ICD-10-CM

## 2021-12-22 DIAGNOSIS — E785 Hyperlipidemia, unspecified: Secondary | ICD-10-CM

## 2021-12-22 DIAGNOSIS — M79641 Pain in right hand: Secondary | ICD-10-CM

## 2021-12-22 DIAGNOSIS — M79642 Pain in left hand: Secondary | ICD-10-CM

## 2021-12-22 DIAGNOSIS — R2681 Unsteadiness on feet: Secondary | ICD-10-CM

## 2021-12-22 DIAGNOSIS — R7689 Other specified abnormal immunological findings in serum: Secondary | ICD-10-CM

## 2021-12-22 DIAGNOSIS — R011 Cardiac murmur, unspecified: Secondary | ICD-10-CM

## 2021-12-22 DIAGNOSIS — Z96651 Presence of right artificial knee joint: Secondary | ICD-10-CM

## 2021-12-22 DIAGNOSIS — M47812 Spondylosis without myelopathy or radiculopathy, cervical region: Secondary | ICD-10-CM

## 2021-12-22 DIAGNOSIS — Z8639 Personal history of other endocrine, nutritional and metabolic disease: Secondary | ICD-10-CM

## 2021-12-22 DIAGNOSIS — M791 Myalgia, unspecified site: Secondary | ICD-10-CM

## 2021-12-22 DIAGNOSIS — D869 Sarcoidosis, unspecified: Secondary | ICD-10-CM

## 2021-12-22 DIAGNOSIS — Z8719 Personal history of other diseases of the digestive system: Secondary | ICD-10-CM

## 2021-12-22 DIAGNOSIS — E1165 Type 2 diabetes mellitus with hyperglycemia: Secondary | ICD-10-CM

## 2021-12-22 DIAGNOSIS — R768 Other specified abnormal immunological findings in serum: Secondary | ICD-10-CM

## 2021-12-22 DIAGNOSIS — I10 Essential (primary) hypertension: Secondary | ICD-10-CM

## 2021-12-22 NOTE — Patient Instructions (Signed)
Cervical Strain and Sprain Rehab Ask your health care provider which exercises are safe for you. Do exercises exactly as told by your health care provider and adjust them as directed. It is normal to feel mild stretching, pulling, tightness, or discomfort as you do these exercises. Stop right away if you feel sudden pain or your pain gets worse. Do not begin these exercises until told by your health care provider. Stretching and range-of-motion exercises Cervical side bending  Using good posture, sit on a stable chair or stand up. Without moving your shoulders, slowly tilt your left / right ear to your shoulder until you feel a stretch in the neck muscles on the opposite side. You should be looking straight ahead. Hold for __________ seconds. Repeat with the other side of your neck. Repeat __________ times. Complete this exercise __________ times a day. Cervical rotation  Using good posture, sit on a stable chair or stand up. Slowly turn your head to the side as if you are looking over your left / right shoulder. Keep your eyes level with the ground. Stop when you feel a stretch along the side and the back of your neck. Hold for __________ seconds. Repeat this by turning to your other side. Repeat __________ times. Complete this exercise __________ times a day. Thoracic extension and pectoral stretch  Roll a towel or a small blanket so it is about 4 inches (10 cm) in diameter. Lie down on your back on a firm surface. Put the towel in the middle of your back across your spine. It should not be under your shoulder blades. Put your hands behind your head and let your elbows fall out to your sides. Hold for __________ seconds. Repeat __________ times. Complete this exercise __________ times a day. Strengthening exercises Upper cervical flexion  Lie on your back with a thin pillow behind your head or a small, rolled-up towel under your neck. Gently tuck your chin toward your chest and nod  your head down to look toward your feet. Do not lift your head off the pillow. Hold for __________ seconds. Release the tension slowly. Relax your neck muscles completely before you repeat this exercise. Repeat __________ times. Complete this exercise __________ times a day. Cervical extension  Stand about 6 inches (15 cm) away from a wall, with your back facing the wall. Place a soft object, about 6-8 inches (15-20 cm) in diameter, between the back of your head and the wall. A soft object could be a small pillow, a ball, or a folded towel. Gently tilt your head back and press into the soft object. Keep your jaw and forehead relaxed. Hold for __________ seconds. Release the tension slowly. Relax your neck muscles completely before you repeat this exercise. Repeat __________ times. Complete this exercise __________ times a day. Posture and body mechanics Body mechanics refer to the movements and positions of your body while you do your daily activities. Posture is part of body mechanics. Good posture and healthy body mechanics can help to relieve stress in your body's tissues and joints. Good posture means that your spine is in its natural S-curve position (your spine is neutral), your shoulders are pulled back slightly, and your head is not tipped forward. The following are general guidelines for using improved posture and body mechanics in your everyday activities. Sitting  When sitting, keep your spine neutral and keep your feet flat on the floor. Use a footrest, if needed, and keep your thighs parallel to the floor. Avoid rounding   your shoulders. Avoid tilting your head forward. When working at a desk or a computer, keep your desk at a height where your hands are slightly lower than your elbows. Slide your chair under your desk so you are close enough to maintain good posture. When working at a computer, place your monitor at a height where you are looking straight ahead and you do not have to  tilt your head forward or downward to look at the screen. Standing  When standing, keep your spine neutral and keep your feet about hip-width apart. Keep a slight bend in your knees. Your ears, shoulders, and hips should line up. When you do a task in which you stand in one place for a long time, place one foot up on a stable object that is 2-4 inches (5-10 cm) high, such as a footstool. This helps keep your spine neutral. Resting When lying down and resting, avoid positions that are most painful for you. Try to support your neck in a neutral position. You can use a contour pillow or a small rolled-up towel. Your pillow should support your neck but not push on it. This information is not intended to replace advice given to you by your health care provider. Make sure you discuss any questions you have with your health care provider. Document Revised: 07/27/2021 Document Reviewed: 07/27/2021 [employer] Patient Education  2023 [employer] Hand Exercises Hand exercises can be helpful for almost anyone. These exercises can strengthen the hands, improve flexibility and movement, and increase blood flow to the hands. These results can make work and daily tasks easier. Hand exercises can be especially helpful for people who have joint pain from arthritis or have nerve damage from overuse (carpal tunnel syndrome). These exercises can also help people who have injured a hand. Exercises Most of these hand exercises are gentle stretching and motion exercises. It is usually safe to do them often throughout the day. Warming up your hands before exercise may help to reduce stiffness. You can do this with gentle massage or by placing your hands in warm water for 10-15 minutes. It is normal to feel some stretching, pulling, tightness, or mild discomfort as you begin new exercises. This will gradually improve. Stop an exercise right away if you feel sudden, severe pain or your pain gets worse. Ask your health care  provider which exercises are best for you. Knuckle bend or "claw" fist  Stand or sit with your arm, hand, and all five fingers pointed straight up. Make sure to keep your wrist straight during the exercise. Gently bend your fingers down toward your palm until the tips of your fingers are touching the top of your palm. Keep your big knuckle straight and just bend the small knuckles in your fingers. Hold this position for __________ seconds. Straighten (extend) your fingers back to the starting position. Repeat this exercise 5-10 times with each hand. Full finger fist  Stand or sit with your arm, hand, and all five fingers pointed straight up. Make sure to keep your wrist straight during the exercise. Gently bend your fingers into your palm until the tips of your fingers are touching the middle of your palm. Hold this position for __________ seconds. Extend your fingers back to the starting position, stretching every joint fully. Repeat this exercise 5-10 times with each hand. Straight fist Stand or sit with your arm, hand, and all five fingers pointed straight up. Make sure to keep your wrist straight during the exercise. Gently bend your   fingers at the big knuckle, where your fingers meet your hand, and the middle knuckle. Keep the knuckle at the tips of your fingers straight and try to touch the bottom of your palm. Hold this position for __________ seconds. Extend your fingers back to the starting position, stretching every joint fully. Repeat this exercise 5-10 times with each hand. Tabletop  Stand or sit with your arm, hand, and all five fingers pointed straight up. Make sure to keep your wrist straight during the exercise. Gently bend your fingers at the big knuckle, where your fingers meet your hand, as far down as you can while keeping the small knuckles in your fingers straight. Think of forming a tabletop with your fingers. Hold this position for __________ seconds. Extend your  fingers back to the starting position, stretching every joint fully. Repeat this exercise 5-10 times with each hand. Finger spread  Place your hand flat on a table with your palm facing down. Make sure your wrist stays straight as you do this exercise. Spread your fingers and thumb apart from each other as far as you can until you feel a gentle stretch. Hold this position for __________ seconds. Bring your fingers and thumb tight together again. Hold this position for __________ seconds. Repeat this exercise 5-10 times with each hand. Making circles  Stand or sit with your arm, hand, and all five fingers pointed straight up. Make sure to keep your wrist straight during the exercise. Make a circle by touching the tip of your thumb to the tip of your index finger. Hold for __________ seconds. Then open your hand wide. Repeat this motion with your thumb and each finger on your hand. Repeat this exercise 5-10 times with each hand. Thumb motion  Sit with your forearm resting on a table and your wrist straight. Your thumb should be facing up toward the ceiling. Keep your fingers relaxed as you move your thumb. Lift your thumb up as high as you can toward the ceiling. Hold for __________ seconds. Bend your thumb across your palm as far as you can, reaching the tip of your thumb for the small finger (pinkie) side of your palm. Hold for __________ seconds. Repeat this exercise 5-10 times with each hand. Grip strengthening  Hold a stress ball or other soft ball in the middle of your hand. Slowly increase the pressure, squeezing the ball as much as you can without causing pain. Think of bringing the tips of your fingers into the middle of your palm. All of your finger joints should bend when doing this exercise. Hold your squeeze for __________ seconds, then relax. Repeat this exercise 5-10 times with each hand. Contact a health care provider if: Your hand pain or discomfort gets much worse when you  do an exercise. Your hand pain or discomfort does not improve within 2 hours after you exercise. If you have any of these problems, stop doing these exercises right away. Do not do them again unless your health care provider says that you can. Get help right away if: You develop sudden, severe hand pain or swelling. If this happens, stop doing these exercises right away. Do not do them again unless your health care provider says that you can. This information is not intended to replace advice given to you by your health care provider. Make sure you discuss any questions you have with your health care provider. Document Revised: 04/24/2020 Document Reviewed: 04/24/2020 [employer] Patient Education  2023 [employer] Exercises for Chronic Knee   Pain Chronic knee pain is pain that lasts longer than 3 months. For most people with chronic knee pain, exercise and weight loss is an important part of treatment. Your health care provider may want you to focus on: Strengthening the muscles that support your knee. This can take pressure off your knee and lessen pain. Preventing knee stiffness. Maintaining or increasing how far you can move your knee. Losing weight (if this applies) to take pressure off your knee, decrease your risk for injury, and make it easier for you to exercise. Your health care provider will help you develop an exercise program that matches your needs and physical abilities. Below are simple, low-impact exercises you can do at home. Ask your health care provider or a physical therapist how often you should do your exercise program and how many times to repeat each exercise. General safety tips Follow these safety tips for exercising with chronic knee pain: Get your health care provider's approval before doing any exercises. Start slowly and stop any time an exercise causes pain. Do not exercise if your knee pain is flaring up. Warm up first. Stretching a cold muscle can cause an injury.  Do 5-10 minutes of easy movement or light stretching before beginning your exercise routine. Do 5-10 minutes of low-impact activity (like walking or cycling) before starting strengthening exercises. Contact your health care provider any time you have pain during or after exercising. Exercise may cause discomfort but should not be painful. It is normal to be a little stiff or sore after exercising.  Stretching and range-of-motion exercises Front thigh stretch  Stand up straight and support your body by holding on to a chair or resting one hand on a wall. With your legs straight and close together, bend one knee to lift your heel up toward your buttocks. Using one hand for support, grab your ankle with your free hand. Pull your foot up closer toward your buttocks to feel the stretch in front of your thigh. Hold the stretch for 30 seconds. Repeat __________ times. Complete this exercise __________ times a day. Back thigh stretch  Sit on the floor with your back straight and your legs out straight in front of you. Place the palms of your hands on the floor and slide them toward your feet as you bend at the hip. Try to touch your nose to your knees and feel the stretch in the back of your thighs. Hold for 30 seconds. Repeat __________ times. Complete this exercise __________ times a day. Calf stretch  Stand facing a wall. Place the palms of your hands flat against the wall, arms extended, and lean slightly against the wall. Get into a lunge position with one leg bent at the knee and the other leg stretched out straight behind you. Keep both feet facing the wall and increase the bend in your knee while keeping the heel of the other leg flat on the ground. You should feel the stretch in your calf. Hold for 30 seconds. Repeat __________ times. Complete this exercise __________ times a day. Strengthening exercises Straight leg lift Lie on your back with one knee bent and the other leg out  straight. Slowly lift the straight leg without bending the knee. Lift until your foot is about 12 inches (30 cm) off the floor. Hold for 3-5 seconds and slowly lower your leg. Repeat __________ times. Complete this exercise __________ times a day. Single leg dip Stand between two chairs and put both hands on the backs of the   chairs for support. Extend one leg out straight with your body weight resting on the heel of the standing leg. Slowly bend your standing knee to dip your body to the level that is comfortable for you. Hold for 3-5 seconds. Repeat __________ times. Complete this exercise __________ times a day. Hamstring curls Stand straight, knees close together, facing the back of a chair. Hold on to the back of a chair with both hands. Keep one leg straight. Bend the other knee while bringing the heel up toward the buttock until the knee is bent at a 90-degree angle (right angle). Hold for 3-5 seconds. Repeat __________ times. Complete this exercise __________ times a day. Wall squat Stand straight with your back, hips, and head against a wall. Step forward one foot at a time with your back still against the wall. Your feet should be 2 feet (61 cm) from the wall at shoulder width. Keeping your back, hips, and head against the wall, slide down the wall to as close of a sitting position as you can get. Hold for 5-10 seconds, then slowly slide back up. Repeat __________ times. Complete this exercise __________ times a day. Step-ups Step up with one foot onto a sturdy platform or stool that is about 6 inches (15 cm) high. Face sideways with one foot on the platform and one on the ground. Place all your weight on the platform foot and lift your body off the ground until your knee extends. Let your other leg hang free to the side. Hold for 3-5 seconds then slowly lower your weight down to the floor foot. Repeat __________ times. Complete this exercise __________ times a day. Contact a  health care provider if: Your exercise causes pain. Your pain is worse after you exercise. Your pain prevents you from doing your exercises. This information is not intended to replace advice given to you by your health care provider. Make sure you discuss any questions you have with your health care provider. Document Revised: 05/10/2019 Document Reviewed: 01/01/2019 [employer] Patient Education  2023 [employer]  

## 2021-12-30 ENCOUNTER — Other Ambulatory Visit: Payer: Self-pay

## 2021-12-30 ENCOUNTER — Encounter: Payer: Self-pay | Admitting: Physical Therapy

## 2021-12-30 ENCOUNTER — Ambulatory Visit: Payer: Medicare Other | Attending: Cardiology | Admitting: Physical Therapy

## 2021-12-30 DIAGNOSIS — M25562 Pain in left knee: Secondary | ICD-10-CM | POA: Insufficient documentation

## 2021-12-30 DIAGNOSIS — M6281 Muscle weakness (generalized): Secondary | ICD-10-CM | POA: Insufficient documentation

## 2021-12-30 DIAGNOSIS — M25561 Pain in right knee: Secondary | ICD-10-CM | POA: Insufficient documentation

## 2021-12-30 DIAGNOSIS — M1712 Unilateral primary osteoarthritis, left knee: Secondary | ICD-10-CM | POA: Diagnosis not present

## 2021-12-30 DIAGNOSIS — R2689 Other abnormalities of gait and mobility: Secondary | ICD-10-CM | POA: Insufficient documentation

## 2021-12-30 DIAGNOSIS — Z96651 Presence of right artificial knee joint: Secondary | ICD-10-CM | POA: Diagnosis not present

## 2021-12-30 DIAGNOSIS — R2681 Unsteadiness on feet: Secondary | ICD-10-CM | POA: Insufficient documentation

## 2021-12-30 NOTE — Therapy (Signed)
OUTPATIENT PHYSICAL THERAPY LOWER EXTREMITY EVALUATION   Patient Name: [name] MRN: [id number] DOB:July 24, 1950, 71 y.o., female Today's Date: 12/30/2021  END OF SESSION:  PT End of Session - 12/30/21 1102     Visit Number 1    Date for PT Re-Evaluation 02/24/22    Authorization Type [employer] medicare    Progress Note Due on Visit 10    PT Start Time 1103    PT Stop Time 1147    PT Time Calculation (min) 44 min    Activity Tolerance Patient tolerated treatment well;No increased pain    Behavior During Therapy WFL for tasks assessed/performed             Past Medical History:  Diagnosis Date   Arthritis    Asthma    CAD (coronary artery disease)    CHF (congestive heart failure) (HCC)    Complication of anesthesia    trouble waking up once   COPD (chronic obstructive pulmonary disease) (HCC)    Diabetes mellitus without complication (HCC)    Dysrhythmia    Heart murmur    Hypertension    Hypothyroidism    Liver disease    NAFLD cirrhosis   Mild aortic stenosis    Pneumonia    Sarcoidosis    Sarcoidosis of lung (HCC)    Sarcoidosis of lymph nodes    Past Surgical History:  Procedure Laterality Date   ABDOMINAL HYSTERECTOMY     ABDOMINAL SURGERY     CHOLECYSTECTOMY     ESOPHAGOGASTRODUODENOSCOPY (EGD) WITH PROPOFOL N/[name] 04/17/2021   Procedure: ESOPHAGOGASTRODUODENOSCOPY (EGD) WITH PROPOFOL;  Surgeon: [name] HawkingHung, Patrick, MD;  Location: [facility];  Service: Gastroenterology;  Laterality: N/[name];   HERNIA REPAIR     LASIK  2001   LEFT HEART CATH AND CORONARY ANGIOGRAPHY N/[name] 09/23/2020   Procedure: LEFT HEART CATH AND CORONARY ANGIOGRAPHY;  Surgeon: [name] BihariKelly, Thomas A, MD;  Location: [facility];  Service: Cardiovascular;  Laterality: N/[name];   nissenfundiplication     TOTAL KNEE ARTHROPLASTY Right 05/19/2021   Procedure: TOTAL KNEE ARTHROPLASTY;  Surgeon: [name] Romanslin, Matthew, MD;  Location: [facility];  Service: Orthopedics;  Laterality: Right;   Patient Active Problem List    Diagnosis Date Noted   Arthropathy of cervical facet joint 11/20/2021   Diastolic dysfunction 11/20/2021   Other cirrhosis of liver (HCC) 11/20/2021   S/P total knee arthroplasty, right 05/19/2021   Systolic anterior movement of mitral valve 03/30/2021   Chest pain 09/23/2020   Thrombocytopenia (HCC) 09/23/2020   Dehydration 06/22/2012   AKI (acute kidney injury) (HCC) 06/22/2012   Hypothyroidism 06/22/2012   HTN (hypertension) 06/22/2012   Uncontrolled type 2 diabetes mellitus with hyperglycemia (HCC) 06/22/2012   Sarcoidosis 06/22/2012   Heart murmur 06/22/2012   UTI (urinary tract infection) 06/22/2012    PCP: [name] OkMoreira, Roy, MD  REFERRING PROVIDER: Pollyann Savoyeveshwar, Shaili, MD  REFERRING DIAG: [phone] (ICD-10-CM) - S/P total knee arthroplasty, right M17.12 (ICD-10-CM) - Primary osteoarthritis of left knee R26.81 (ICD-10-CM) - Unstable gait  THERAPY DIAG:  Right knee pain, unspecified chronicity  Left knee pain, unspecified chronicity  Other abnormalities of gait and mobility  Muscle weakness (generalized)  Rationale for Evaluation and Treatment: Rehabilitation  ONSET DATE: B knee pain ~5 years  SUBJECTIVE:   SUBJECTIVE STATEMENT: Pt states she has typically been quite active but had gradually worsening knee pain (B) that eventually resulted in TKA. Pt states she was doing well after TKA in May and had received therapy, states her pain has  started to worsen. Pt states both knees bother her quite [name] bit, L knee more troublesome. Has had knee pain for several years, received injections w/ minimal relief. Pt states variable pain, but afternoons tend to be worse after more activity. Pt states she has tried to increase in activity but is limited as she tends to have increased pain after. Denies N/T aside from post surgical numbness R knee.   PERTINENT HISTORY: HTN, DM2, R TKA (may 2023), CAD, CHF, HTN PAIN:  Are you having pain: no Location: B knees L>R, anterior and  medial How would you describe your pain? burning Best in past week: 0/10 Worst in past week: 7/10 Aggravating factors: transfers, walking/standing (variable, sometimes has difficulty w/ household distance), difficulty w/ stairs (more so descending) Easing factors: rest, heating pad, walking (sometimes)   PRECAUTIONS: Fall  WEIGHT BEARING RESTRICTIONS: No  FALLS:  Has patient fallen in last 6 months? No but does endorse some issues w/ balance  LIVING ENVIRONMENT: Lives w/ spouse; tub vs walk in shower; 1 story home 2STE w/ rail, difficulty with stair navigation Occasionally uses SPC; has [name] RW, wheelchair  OCCUPATION: retired - former Engineer, civil (consulting); enjoys riding horses (was doing up until about [name] year ago, stopped due to knee pain)  PLOF: Independent with occasional AD use  PATIENT GOALS: reduce pain, improve transfers, would like to be more active again  NEXT MD VISIT: January 2024  OBJECTIVE:   DIAGNOSTIC FINDINGS: none recently  PATIENT SURVEYS:  FOTO 47% initial, 53% predicted  COGNITION: Overall cognitive status: Within functional limits for tasks assessed     SENSATION: Grossly intact aside from numbness along medial/anterior R knee which pt states has been consistent since TKA; denies other sensory symptoms   PALPATION: RLE: TTP distal quad, gradually improving proximally LLE: TTP lateral/medial joint lines No patellar tenderness  LOWER EXTREMITY ROM:  Active ROM Right eval Left eval  Hip flexion    Hip extension    Hip abduction    Hip adduction    Hip internal rotation    Hip external rotation    Knee flexion    Knee extension    Ankle dorsiflexion    Ankle plantarflexion    Ankle inversion    Ankle eversion     (Blank rows = not tested)  LOWER EXTREMITY MMT:  MMT Right eval Left eval  Hip flexion 4- 4  Hip extension    Hip abduction modified sitting 4+ 4+  Hip adduction    Hip internal rotation    Hip external rotation    Knee flexion 3+p!  4-  Knee extension 4 4  Ankle dorsiflexion    Ankle plantarflexion    Ankle inversion    Ankle eversion     (Blank rows = not tested)    FUNCTIONAL TESTS:  30sec STS 3.5 repetitions from standard chair, heavy UE use, knee pain B  GAIT: Distance walked: within clinic Assistive device utilized: None Level of assistance: Complete Independence Comments: antalgic gait R>L, reduced knee ROM throughout all phases of gait (B but more so on R). Mildly flexed trunk posture. Partial step through pattern leading w/ R. Reduced cadence and gait speed.    TODAY'S TREATMENT:  [facility] Adult PT Treatment:                                                DATE: 12/30/21 Deferred given increased time w/ education/discussion  PATIENT EDUCATION:  Education details: Pt education on PT impairments, prognosis, and POC. Informed consent. Rationale for interventions, safe/appropriate HEP performance. Significant time spent with discussion/education re: pt goals, POC, and relevant anatomy/physiology Person educated: Patient Education method: Explanation, Demonstration, Tactile cues, Verbal cues, and Handouts Education comprehension: verbalized understanding, returned demonstration, verbal cues required, tactile cues required, and needs further education    HOME EXERCISE PROGRAM: Deferred given increased time w/ education/discussion  ASSESSMENT:  CLINICAL IMPRESSION: Patient is [name] 71 y.o. woman who was seen today for physical therapy evaluation and treatment for L knee pain and gait issues. Pt reports chronic B knee pain for several years, initially improved after R TKA and subsequent therapy but recently worsening. Pt reports some balance issues but denies falls. Reports variable symptoms but tend to be worst with repetitive weightbearing, most difficulty with sit to stand transfers. Pt  reports previously being quite active, enjoys working with horses, but has had to modify activities due to pain. On exam pt demonstrates notable weakness B LE, R more affected than L, limited by weakness/pain. Gait impairments as above. 30sec sit to stand score indicative of fall risk (<10sec for pt's cohort per [facility]). No adverse events. Significant time spent w/ pt education as above, subsequently will defer HEP establishment until next session. Pt departs today's session in no acute distress, all voiced questions/concerns addressed appropriately from PT perspective.    OBJECTIVE IMPAIRMENTS: Abnormal gait, decreased activity tolerance, decreased balance, decreased endurance, decreased mobility, difficulty walking, decreased ROM, decreased strength, improper body mechanics, and pain.   ACTIVITY LIMITATIONS: carrying, lifting, bending, sitting, standing, squatting, sleeping, stairs, transfers, and locomotion level  PARTICIPATION LIMITATIONS: cleaning, laundry, driving, and community activity  PERSONAL FACTORS: Age, Time since onset of injury/illness/exacerbation, and 3+ comorbidities: HTN, DM, RTKA May 2023, CAD/CHF, HTN  are also affecting patient's functional outcome.   REHAB POTENTIAL: Fair given chronicity and comorbidities  CLINICAL DECISION MAKING: Stable/uncomplicated  EVALUATION COMPLEXITY: Low   GOALS: Goals reviewed with patient? No  SHORT TERM GOALS: Target date: 01/27/2022   Pt will demonstrate appropriate understanding and performance of initially prescribed HEP in order to facilitate improved independence with management of symptoms.  Baseline: HEP provided on eval Goal status: INITIAL   2. Pt will score greater than or equal to 50 on FOTO in order to demonstrate improved perception of function due to symptoms.  Baseline: 47  Goal status: INITIAL   LONG TERM GOALS: Target date: 02/24/2022   Pt will score 53 or greater on FOTO in order to demonstrate improved perception of  function due to symptoms.  Baseline: 47 Goal status: INITIAL  2.  Pt will demonstrate 4+/5 knee flexion/extension MMT B in order to facilitate improved strength for transfers/WB. Baseline: see MMT chart above Goal status: INITIAL  3.  Pt will report ability to stand/walk for up to with less than 3/10 pain on NPS in order to improve tolerance for community ambulation and household tasks. Baseline: variable but up to 7/10 pain with above Goal status: INITIAL  4.  Pt will be able to perform at least 7 repetitions for 30sec sit to stand test in order  to indicate reduced fall risk and improved independence w/ transfers. (Per CDC cutoff score for fall risk <10 for women in pt age group) Baseline: 3.5 repetitions w/ knee pain Goal status: INITIAL  5. Pt will be able to ascend/descend 2 standard steps without rail safely w/ less than 3/10 pain on NPS in order to indicate improved safety w/ home entry Baseline: pain/difficulty with entering home Goal status: INITIAL       PLAN:  PT FREQUENCY: 1-2x/week  PT DURATION: 8 weeks  PLANNED INTERVENTIONS: Therapeutic exercises, Therapeutic activity, Neuromuscular re-education, Balance training, Gait training, Patient/Family education, Self Care, Joint mobilization, Stair training, Aquatic Therapy, Dry Needling, Electrical stimulation, Cryotherapy, Moist heat, Taping, Manual therapy, and Re-evaluation  PLAN FOR NEXT SESSION: Progress ROM/strengthening exercises as able/appropriate, review HEP.    Ashley Murrain PT, DPT 12/30/2021 2:53 PM

## 2022-01-04 NOTE — Therapy (Signed)
OUTPATIENT PHYSICAL THERAPY TREATMENT NOTE   Patient Name: [name] MRN: [id number] DOB:08-24-1950, 71 y.o., female Today's Date: 01/05/2022  PCP: Ralene Ok, MD   REFERRING PROVIDER: Pollyann Savoy, MD  END OF SESSION:   PT End of Session - 01/05/22 1136     Visit Number 2    Date for PT Re-Evaluation 02/24/22    Authorization Type [employer] medicare    Progress Note Due on Visit 10    PT Start Time 1138    PT Stop Time 1224    PT Time Calculation (min) 46 min    Activity Tolerance Patient tolerated treatment well;No increased pain    Behavior During Therapy WFL for tasks assessed/performed             Past Medical History:  Diagnosis Date   Arthritis    Asthma    CAD (coronary artery disease)    CHF (congestive heart failure) (HCC)    Complication of anesthesia    trouble waking up once   COPD (chronic obstructive pulmonary disease) (HCC)    Diabetes mellitus without complication (HCC)    Dysrhythmia    Heart murmur    Hypertension    Hypothyroidism    Liver disease    NAFLD cirrhosis   Mild aortic stenosis    Pneumonia    Sarcoidosis    Sarcoidosis of lung (HCC)    Sarcoidosis of lymph nodes    Past Surgical History:  Procedure Laterality Date   ABDOMINAL HYSTERECTOMY     ABDOMINAL SURGERY     CHOLECYSTECTOMY     ESOPHAGOGASTRODUODENOSCOPY (EGD) WITH PROPOFOL N/A 04/17/2021   Procedure: ESOPHAGOGASTRODUODENOSCOPY (EGD) WITH PROPOFOL;  Surgeon: Jeani Hawking, MD;  Location: [facility];  Service: Gastroenterology;  Laterality: N/A;   HERNIA REPAIR     LASIK  2001   LEFT HEART CATH AND CORONARY ANGIOGRAPHY N/A 09/23/2020   Procedure: LEFT HEART CATH AND CORONARY ANGIOGRAPHY;  Surgeon: Lennette Bihari, MD;  Location: [facility];  Service: Cardiovascular;  Laterality: N/A;   nissenfundiplication     TOTAL KNEE ARTHROPLASTY Right 05/19/2021   Procedure: TOTAL KNEE ARTHROPLASTY;  Surgeon: Durene Romans, MD;  Location: [facility];  Service:  Orthopedics;  Laterality: Right;   Patient Active Problem List   Diagnosis Date Noted   Arthropathy of cervical facet joint 11/20/2021   Diastolic dysfunction 11/20/2021   Other cirrhosis of liver (HCC) 11/20/2021   S/P total knee arthroplasty, right 05/19/2021   Systolic anterior movement of mitral valve 03/30/2021   Chest pain 09/23/2020   Thrombocytopenia (HCC) 09/23/2020   Dehydration 06/22/2012   AKI (acute kidney injury) (HCC) 06/22/2012   Hypothyroidism 06/22/2012   HTN (hypertension) 06/22/2012   Uncontrolled type 2 diabetes mellitus with hyperglycemia (HCC) 06/22/2012   Sarcoidosis 06/22/2012   Heart murmur 06/22/2012   UTI (urinary tract infection) 06/22/2012    REFERRING DIAG: D98.338 (ICD-10-CM) - S/P total knee arthroplasty, right M17.12 (ICD-10-CM) - Primary osteoarthritis of left knee R26.81 (ICD-10-CM) - Unstable gait  THERAPY DIAG:  Right knee pain, unspecified chronicity  Left knee pain, unspecified chronicity  Other abnormalities of gait and mobility  Muscle weakness (generalized)  Rationale for Evaluation and Treatment Rehabilitation  PERTINENT HISTORY: HTN, DM2, R TKA (may 2023), CAD, CHF, HTN   PRECAUTIONS: fall risk  SUBJECTIVE:  SUBJECTIVE STATEMENT:    Pt arrives w/ 3/10 pain, no other new updates although she notes she tried doing sit to stands w/o hands, had soreness the next day that resolved.  PAIN:  Are you having pain: yes 3/10 B knee Location: B knees L>R, anterior and medial How would you describe your pain? burning Best in past week: 0/10 Worst in past week: 7/10 Aggravating factors: transfers, walking/standing (variable, sometimes has difficulty w/ household distance), difficulty w/ stairs (more so descending) Easing factors: rest, heating pad, walking  (sometimes)     OBJECTIVE: (objective measures completed at initial evaluation unless otherwise dated)   DIAGNOSTIC FINDINGS: none recently   PATIENT SURVEYS:  FOTO 47% initial, 53% predicted   COGNITION: Overall cognitive status: Within functional limits for tasks assessed                         SENSATION: Grossly intact aside from numbness along medial/anterior R knee which pt states has been consistent since TKA; denies other sensory symptoms     PALPATION: RLE: TTP distal quad, gradually improving proximally LLE: TTP lateral/medial joint lines No patellar tenderness   LOWER EXTREMITY ROM:   Active ROM Right eval Left eval  Hip flexion      Hip extension      Hip abduction      Hip adduction      Hip internal rotation      Hip external rotation      Knee flexion      Knee extension      Ankle dorsiflexion      Ankle plantarflexion      Ankle inversion      Ankle eversion       (Blank rows = not tested)   LOWER EXTREMITY MMT:   MMT Right eval Left eval  Hip flexion 4- 4  Hip extension      Hip abduction modified sitting 4+ 4+  Hip adduction      Hip internal rotation      Hip external rotation      Knee flexion 3+p! 4-  Knee extension 4 4  Ankle dorsiflexion      Ankle plantarflexion      Ankle inversion      Ankle eversion       (Blank rows = not tested)       FUNCTIONAL TESTS:  30sec STS 3.5 repetitions from standard chair, heavy UE use, knee pain B   GAIT: Distance walked: within clinic Assistive device utilized: None Level of assistance: Complete Independence Comments: antalgic gait R>L, reduced knee ROM throughout all phases of gait (B but more so on R). Mildly flexed trunk posture. Partial step through pattern leading w/ R. Reduced cadence and gait speed.      TOD[facility]REATMENT:                                                                                                    [facility]  DATE: 01/05/22 Therapeutic Exercise: Nu step LE/UE during subjective Seated heel slides w/ strap + slider x10 B LE cues for setup and pacing  LAQ 2x5 cues for form and quad squeeze  Standing heel raises x12 B UE support 6inch step weight shifts + push downs 2x8 B w/ UE support for functional strength w/ stair navigation Establishing HEP, handout + review    PATIENT EDUCATION:  Education details: rationale for interventions, HEP review/education Person educated: Patient Education method: Explanation, Demonstration, Tactile cues, Verbal cues, and Handouts Education comprehension: verbalized understanding, returned demonstration, verbal cues required, tactile cues required, and needs further education     HOME EXERCISE PROGRAM: Access Code: [email or web address] URL: [email or web address] Date: 01/05/2022 Prepared by: Fransisco Hertz  Program Notes use chair for sitting heel slide  Exercises - Sitting Heel Slide with Towel  - 1 x daily - 7 x weekly - 3 sets - 10 reps - Seated Long Arc Quad  - 1 x daily - 7 x weekly - 2 sets - 5 reps - Heel Raises with Counter Support  - 1 x daily - 7 x weekly - 2 sets - 10 reps   ASSESSMENT:   CLINICAL IMPRESSION: 01/05/2022 Pt arrives with 3/10 pain, no other new updates. Tolerates session well with establishment of HEP focusing on knee tissue extensibility and quad strength/endurance. Introduction of closed chain activity with good tolerance, report of muscular fatigue but no overt increase in pain. Provided w/ HEP handout and reviewed in depth. Significant time spent w/ pt education, rationale for interventions, and HEP discussion. Pt receptive and verbalizes good understanding throughout. Denies any increase in symptoms on departure, notes that L knee feels some improvement after activity. No adverse events. Pt departs today's session in no acute distress, all voiced questions/concerns addressed appropriately from PT perspective.     Eval - Patient is a 71 y.o. woman who was seen today for physical therapy evaluation and treatment for L knee pain and gait issues. Pt reports chronic B knee pain for several years, initially improved after R TKA and subsequent therapy but recently worsening. Pt reports some balance issues but denies falls. Reports variable symptoms but tend to be worst with repetitive weightbearing, most difficulty with sit to stand transfers. Pt reports previously being quite active, enjoys working with horses, but has had to modify activities due to pain. On exam pt demonstrates notable weakness B LE, R more affected than L, limited by weakness/pain. Gait impairments as above. 30sec sit to stand score indicative of fall risk (<10sec for pt's cohort per [facility]). No adverse events. Significant time spent w/ pt education as above, subsequently will defer HEP establishment until next session. Pt departs today's session in no acute distress, all voiced questions/concerns addressed appropriately from PT perspective.     OBJECTIVE IMPAIRMENTS: Abnormal gait, decreased activity tolerance, decreased balance, decreased endurance, decreased mobility, difficulty walking, decreased ROM, decreased strength, improper body mechanics, and pain.    ACTIVITY LIMITATIONS: carrying, lifting, bending, sitting, standing, squatting, sleeping, stairs, transfers, and locomotion level   PARTICIPATION LIMITATIONS: cleaning, laundry, driving, and community activity   PERSONAL FACTORS: Age, Time since onset of injury/illness/exacerbation, and 3+ comorbidities: HTN, DM, RTKA May 2023, CAD/CHF, HTN  are also affecting patient's functional outcome.    REHAB POTENTIAL: Fair given chronicity and comorbidities   CLINICAL DECISION MAKING: Stable/uncomplicated   EVALUATION COMPLEXITY: Low     GOALS: Goals reviewed with patient? No   SHORT TERM GOALS: Target date: 01/27/2022   Pt will demonstrate  appropriate understanding and performance of  initially prescribed HEP in order to facilitate improved independence with management of symptoms.  Baseline: HEP provided on eval Goal status: INITIAL    2. Pt will score greater than or equal to 50 on FOTO in order to demonstrate improved perception of function due to symptoms.            Baseline: 47            Goal status: INITIAL    LONG TERM GOALS: Target date: 02/24/2022    Pt will score 53 or greater on FOTO in order to demonstrate improved perception of function due to symptoms.  Baseline: 47 Goal status: INITIAL   2.  Pt will demonstrate 4+/5 knee flexion/extension MMT B in order to facilitate improved strength for transfers/WB. Baseline: see MMT chart above Goal status: INITIAL   3.  Pt will report ability to stand/walk for up to with less than 3/10 pain on NPS in order to improve tolerance for community ambulation and household tasks. Baseline: variable but up to 7/10 pain with above Goal status: INITIAL   4.  Pt will be able to perform at least 7 repetitions for 30sec sit to stand test in order to indicate reduced fall risk and improved independence w/ transfers. (Per CDC cutoff score for fall risk <10 for women in pt age group) Baseline: 3.5 repetitions w/ knee pain Goal status: INITIAL   5. Pt will be able to ascend/descend 2 standard steps without rail safely w/ less than 3/10 pain on NPS in order to indicate improved safety w/ home entry Baseline: pain/difficulty with entering home Goal status: INITIAL           PLAN:   PT FREQUENCY: 1-2x/week   PT DURATION: 8 weeks   PLANNED INTERVENTIONS: Therapeutic exercises, Therapeutic activity, Neuromuscular re-education, Balance training, Gait training, Patient/Family education, Self Care, Joint mobilization, Stair training, Aquatic Therapy, Dry Needling, Electrical stimulation, Cryotherapy, Moist heat, Taping, Manual therapy, and Re-evaluation   PLAN FOR NEXT SESSION: Progress ROM/strengthening exercises as  able/appropriate, review HEP.    Ashley Murrain PT, DPT 01/05/2022 12:28 PM

## 2022-01-05 ENCOUNTER — Ambulatory Visit: Payer: Medicare Other | Admitting: Physical Therapy

## 2022-01-05 ENCOUNTER — Encounter: Payer: Self-pay | Admitting: Physical Therapy

## 2022-01-05 DIAGNOSIS — M25561 Pain in right knee: Secondary | ICD-10-CM | POA: Diagnosis not present

## 2022-01-05 DIAGNOSIS — R2689 Other abnormalities of gait and mobility: Secondary | ICD-10-CM

## 2022-01-05 DIAGNOSIS — M25562 Pain in left knee: Secondary | ICD-10-CM

## 2022-01-05 DIAGNOSIS — M6281 Muscle weakness (generalized): Secondary | ICD-10-CM

## 2022-01-06 NOTE — Therapy (Signed)
OUTPATIENT PHYSICAL THERAPY TREATMENT NOTE   Patient Name: [name] MRN: [id number] DOB:1950-02-28, 71 y.o., female Today's Date: 01/07/2022  PCP: Ralene Ok, MD   REFERRING PROVIDER: Pollyann Savoy, MD  END OF SESSION:   PT End of Session - 01/07/22 1143     Visit Number 3    Date for PT Re-Evaluation 02/24/22    Authorization Type [employer] medicare    Progress Note Due on Visit 10    PT Start Time 1143    PT Stop Time 1225    PT Time Calculation (min) 42 min    Activity Tolerance Patient tolerated treatment well;No increased pain    Behavior During Therapy WFL for tasks assessed/performed              Past Medical History:  Diagnosis Date   Arthritis    Asthma    CAD (coronary artery disease)    CHF (congestive heart failure) (HCC)    Complication of anesthesia    trouble waking up once   COPD (chronic obstructive pulmonary disease) (HCC)    Diabetes mellitus without complication (HCC)    Dysrhythmia    Heart murmur    Hypertension    Hypothyroidism    Liver disease    NAFLD cirrhosis   Mild aortic stenosis    Pneumonia    Sarcoidosis    Sarcoidosis of lung (HCC)    Sarcoidosis of lymph nodes    Past Surgical History:  Procedure Laterality Date   ABDOMINAL HYSTERECTOMY     ABDOMINAL SURGERY     CHOLECYSTECTOMY     ESOPHAGOGASTRODUODENOSCOPY (EGD) WITH PROPOFOL N/A 04/17/2021   Procedure: ESOPHAGOGASTRODUODENOSCOPY (EGD) WITH PROPOFOL;  Surgeon: Jeani Hawking, MD;  Location: [facility];  Service: Gastroenterology;  Laterality: N/A;   HERNIA REPAIR     LASIK  2001   LEFT HEART CATH AND CORONARY ANGIOGRAPHY N/A 09/23/2020   Procedure: LEFT HEART CATH AND CORONARY ANGIOGRAPHY;  Surgeon: Lennette Bihari, MD;  Location: [facility];  Service: Cardiovascular;  Laterality: N/A;   nissenfundiplication     TOTAL KNEE ARTHROPLASTY Right 05/19/2021   Procedure: TOTAL KNEE ARTHROPLASTY;  Surgeon: Durene Romans, MD;  Location: [facility];  Service:  Orthopedics;  Laterality: Right;   Patient Active Problem List   Diagnosis Date Noted   Arthropathy of cervical facet joint 11/20/2021   Diastolic dysfunction 11/20/2021   Other cirrhosis of liver (HCC) 11/20/2021   S/P total knee arthroplasty, right 05/19/2021   Systolic anterior movement of mitral valve 03/30/2021   Chest pain 09/23/2020   Thrombocytopenia (HCC) 09/23/2020   Dehydration 06/22/2012   AKI (acute kidney injury) (HCC) 06/22/2012   Hypothyroidism 06/22/2012   HTN (hypertension) 06/22/2012   Uncontrolled type 2 diabetes mellitus with hyperglycemia (HCC) 06/22/2012   Sarcoidosis 06/22/2012   Heart murmur 06/22/2012   UTI (urinary tract infection) 06/22/2012    REFERRING DIAG: W09.811 (ICD-10-CM) - S/P total knee arthroplasty, right M17.12 (ICD-10-CM) - Primary osteoarthritis of left knee R26.81 (ICD-10-CM) - Unstable gait  THERAPY DIAG:  Right knee pain, unspecified chronicity  Left knee pain, unspecified chronicity  Other abnormalities of gait and mobility  Muscle weakness (generalized)  Rationale for Evaluation and Treatment Rehabilitation  PERTINENT HISTORY: HTN, DM2, R TKA (may 2023), CAD, CHF, HTN   PRECAUTIONS: fall risk  SUBJECTIVE:  SUBJECTIVE STATEMENT:   Pt arrives w/o significant change in symptoms, notes that she felt great during last session but had pretty significant soreness later on the day, but she "worked it out" and is feeling better. No other new updates, good compliance w/ HEP reported.    PAIN:  Are you having pain: yes 2/10 B knee Location: B knees L>R, anterior and medial How would you describe your pain? burning Best in past week: 0/10 Worst in past week: 7/10 Aggravating factors: transfers, walking/standing (variable, sometimes has difficulty w/  household distance), difficulty w/ stairs (more so descending) Easing factors: rest, heating pad, walking (sometimes)     OBJECTIVE: (objective measures completed at initial evaluation unless otherwise dated)   DIAGNOSTIC FINDINGS: none recently   PATIENT SURVEYS:  FOTO 47% initial, 53% predicted   COGNITION: Overall cognitive status: Within functional limits for tasks assessed                         SENSATION: Grossly intact aside from numbness along medial/anterior R knee which pt states has been consistent since TKA; denies other sensory symptoms     PALPATION: RLE: TTP distal quad, gradually improving proximally LLE: TTP lateral/medial joint lines No patellar tenderness   LOWER EXTREMITY ROM:   Active ROM Right eval Left eval  Hip flexion      Hip extension      Hip abduction      Hip adduction      Hip internal rotation      Hip external rotation      Knee flexion      Knee extension      Ankle dorsiflexion      Ankle plantarflexion      Ankle inversion      Ankle eversion       (Blank rows = not tested)   LOWER EXTREMITY MMT:   MMT Right eval Left eval  Hip flexion 4- 4  Hip extension      Hip abduction modified sitting 4+ 4+  Hip adduction      Hip internal rotation      Hip external rotation      Knee flexion 3+p! 4-  Knee extension 4 4  Ankle dorsiflexion      Ankle plantarflexion      Ankle inversion      Ankle eversion       (Blank rows = not tested)       FUNCTIONAL TESTS:  30sec STS 3.5 repetitions from standard chair, heavy UE use, knee pain B   GAIT: Distance walked: within clinic Assistive device utilized: None Level of assistance: Complete Independence Comments: antalgic gait R>L, reduced knee ROM throughout all phases of gait (B but more so on R). Mildly flexed trunk posture. Partial step through pattern leading w/ R. Reduced cadence and gait speed.    01/07/22: HR 70s, SpO2 98% w/ SOB in supine position that resolves with  sitting position. VSS throughout remainder of session, no further symptoms.   TODAY'S TREATMENT:  [facility] Adult PT Treatment:                                                DATE: 01/07/22 Therapeutic Exercise: Nustep L5 LE/UE during subjective Supine heel slide x5 RLE only, discontinued d/t SOB in supine position (vitals stable as above and resolves in seated position) Seated heel slides 2x5 B LE with strap and slider  LAQ x8 B cues for form and quad squeeze  Standing hamstring curls no weight 2x5 each LE  Heel raises x12 cues for velocity  Standing hip kickbacks 45 deg x5 B UE support cues form and posture Adductor isos 2x8 cues for form, posture, and breath control      [facility] Adult PT Treatment:                                                DATE: 01/05/22 Therapeutic Exercise: Nu step LE/UE during subjective Seated heel slides w/ strap + slider x10 B LE cues for setup and pacing  LAQ 2x5 cues for form and quad squeeze  Standing heel raises x12 B UE support 6inch step weight shifts + push downs 2x8 B w/ UE support for functional strength w/ stair navigation Establishing HEP, handout + review    PATIENT EDUCATION:  Education details: rationale for interventions, HEP review/education Person educated: Patient Education method: Explanation, Demonstration, Tactile cues, Verbal cues, and Handouts Education comprehension: verbalized understanding, returned demonstration, verbal cues required, tactile cues required, and needs further education     HOME EXERCISE PROGRAM: Access Code: [email or web address] URL: [email or web address] Date: 01/05/2022 Prepared by: Fransisco Hertz  Program Notes use chair for sitting heel slide  Exercises - Sitting Heel Slide with Towel  - 1 x daily - 7 x weekly - 3 sets - 10 reps - Seated Long Arc Quad  - 1 x daily - 7 x weekly - 2 sets - 5 reps - Heel  Raises with Counter Support  - 1 x daily - 7 x weekly - 2 sets - 10 reps   ASSESSMENT:   CLINICAL IMPRESSION: 01/07/2022 Pt arrives with 2/10 pain at present, report of soreness after last session, improved today. Given significant soreness after last session, limited progression on this date to maximize adaptation and positive response to activity. Gentle progress for volume as able, rest breaks as needed and cues as above. Incorporated hamstring/hip work with good tolerance. Brief episode of SOB in supine position (which pt states is common for her), vitals stable and quickly resolves with return to sitting position. Vitals intermittently checked and stable throughout remainder of session w/o issue. Gradual improvement in pain throughout, reports 0-1/10 knee pain on departure. No adverse events, tolerates session well. Pt departs today's session in no acute distress, all voiced questions/concerns addressed appropriately from PT perspective.    Eval - Patient is a 71 y.o. woman who was seen today for physical therapy evaluation and treatment for L knee pain and gait issues. Pt reports chronic B knee pain for several years, initially improved after R TKA and subsequent therapy but recently worsening. Pt reports some balance issues but denies falls. Reports variable symptoms but tend to be worst with repetitive weightbearing, most difficulty with sit to stand  transfers. Pt reports previously being quite active, enjoys working with horses, but has had to modify activities due to pain. On exam pt demonstrates notable weakness B LE, R more affected than L, limited by weakness/pain. Gait impairments as above. 30sec sit to stand score indicative of fall risk (<10sec for pt's cohort per [facility]). No adverse events. Significant time spent w/ pt education as above, subsequently will defer HEP establishment until next session. Pt departs today's session in no acute distress, all voiced questions/concerns addressed  appropriately from PT perspective.     OBJECTIVE IMPAIRMENTS: Abnormal gait, decreased activity tolerance, decreased balance, decreased endurance, decreased mobility, difficulty walking, decreased ROM, decreased strength, improper body mechanics, and pain.    ACTIVITY LIMITATIONS: carrying, lifting, bending, sitting, standing, squatting, sleeping, stairs, transfers, and locomotion level   PARTICIPATION LIMITATIONS: cleaning, laundry, driving, and community activity   PERSONAL FACTORS: Age, Time since onset of injury/illness/exacerbation, and 3+ comorbidities: HTN, DM, RTKA May 2023, CAD/CHF, HTN  are also affecting patient's functional outcome.    REHAB POTENTIAL: Fair given chronicity and comorbidities   CLINICAL DECISION MAKING: Stable/uncomplicated   EVALUATION COMPLEXITY: Low     GOALS: Goals reviewed with patient? No   SHORT TERM GOALS: Target date: 01/27/2022   Pt will demonstrate appropriate understanding and performance of initially prescribed HEP in order to facilitate improved independence with management of symptoms.  Baseline: HEP provided on eval Goal status: INITIAL    2. Pt will score greater than or equal to 50 on FOTO in order to demonstrate improved perception of function due to symptoms.            Baseline: 47            Goal status: INITIAL    LONG TERM GOALS: Target date: 02/24/2022    Pt will score 53 or greater on FOTO in order to demonstrate improved perception of function due to symptoms.  Baseline: 47 Goal status: INITIAL   2.  Pt will demonstrate 4+/5 knee flexion/extension MMT B in order to facilitate improved strength for transfers/WB. Baseline: see MMT chart above Goal status: INITIAL   3.  Pt will report ability to stand/walk for up to with less than 3/10 pain on NPS in order to improve tolerance for community ambulation and household tasks. Baseline: variable but up to 7/10 pain with above Goal status: INITIAL   4.  Pt will be able to  perform at least 7 repetitions for 30sec sit to stand test in order to indicate reduced fall risk and improved independence w/ transfers. (Per CDC cutoff score for fall risk <10 for women in pt age group) Baseline: 3.5 repetitions w/ knee pain Goal status: INITIAL   5. Pt will be able to ascend/descend 2 standard steps without rail safely w/ less than 3/10 pain on NPS in order to indicate improved safety w/ home entry Baseline: pain/difficulty with entering home Goal status: INITIAL           PLAN:   PT FREQUENCY: 1-2x/week   PT DURATION: 8 weeks   PLANNED INTERVENTIONS: Therapeutic exercises, Therapeutic activity, Neuromuscular re-education, Balance training, Gait training, Patient/Family education, Self Care, Joint mobilization, Stair training, Aquatic Therapy, Dry Needling, Electrical stimulation, Cryotherapy, Moist heat, Taping, Manual therapy, and Re-evaluation   PLAN FOR NEXT SESSION:  Progress ROM/strengthening exercises as able/appropriate, review HEP.    Ashley Murrain PT, DPT 01/07/2022 12:32 PM

## 2022-01-07 ENCOUNTER — Ambulatory Visit: Payer: Medicare Other | Admitting: Physical Therapy

## 2022-01-07 ENCOUNTER — Encounter: Payer: Self-pay | Admitting: Physical Therapy

## 2022-01-07 DIAGNOSIS — R2689 Other abnormalities of gait and mobility: Secondary | ICD-10-CM

## 2022-01-07 DIAGNOSIS — M25561 Pain in right knee: Secondary | ICD-10-CM

## 2022-01-07 DIAGNOSIS — M6281 Muscle weakness (generalized): Secondary | ICD-10-CM

## 2022-01-07 DIAGNOSIS — M25562 Pain in left knee: Secondary | ICD-10-CM

## 2022-01-12 NOTE — Therapy (Signed)
OUTPATIENT PHYSICAL THERAPY TREATMENT NOTE   Patient Name: [name] MRN: [id number] DOB:1950/07/02, 71 y.o., female Today's Date: 01/13/2022  PCP: Ralene Ok, MD   REFERRING PROVIDER: Pollyann Savoy, MD  END OF SESSION:   PT End of Session - 01/13/22 1146     Visit Number 4    Date for PT Re-Evaluation 02/24/22    Authorization Type [employer] medicare    Progress Note Due on Visit 10    PT Start Time 1146    PT Stop Time 1225    PT Time Calculation (min) 39 min    Activity Tolerance Patient tolerated treatment well;No increased pain    Behavior During Therapy WFL for tasks assessed/performed               Past Medical History:  Diagnosis Date   Arthritis    Asthma    CAD (coronary artery disease)    CHF (congestive heart failure) (HCC)    Complication of anesthesia    trouble waking up once   COPD (chronic obstructive pulmonary disease) (HCC)    Diabetes mellitus without complication (HCC)    Dysrhythmia    Heart murmur    Hypertension    Hypothyroidism    Liver disease    NAFLD cirrhosis   Mild aortic stenosis    Pneumonia    Sarcoidosis    Sarcoidosis of lung (HCC)    Sarcoidosis of lymph nodes    Past Surgical History:  Procedure Laterality Date   ABDOMINAL HYSTERECTOMY     ABDOMINAL SURGERY     CHOLECYSTECTOMY     ESOPHAGOGASTRODUODENOSCOPY (EGD) WITH PROPOFOL N/A 04/17/2021   Procedure: ESOPHAGOGASTRODUODENOSCOPY (EGD) WITH PROPOFOL;  Surgeon: Jeani Hawking, MD;  Location: [facility];  Service: Gastroenterology;  Laterality: N/A;   HERNIA REPAIR     LASIK  2001   LEFT HEART CATH AND CORONARY ANGIOGRAPHY N/A 09/23/2020   Procedure: LEFT HEART CATH AND CORONARY ANGIOGRAPHY;  Surgeon: Lennette Bihari, MD;  Location: [facility];  Service: Cardiovascular;  Laterality: N/A;   nissenfundiplication     TOTAL KNEE ARTHROPLASTY Right 05/19/2021   Procedure: TOTAL KNEE ARTHROPLASTY;  Surgeon: Durene Romans, MD;  Location: [facility];  Service:  Orthopedics;  Laterality: Right;   Patient Active Problem List   Diagnosis Date Noted   Arthropathy of cervical facet joint 11/20/2021   Diastolic dysfunction 11/20/2021   Other cirrhosis of liver (HCC) 11/20/2021   S/P total knee arthroplasty, right 05/19/2021   Systolic anterior movement of mitral valve 03/30/2021   Chest pain 09/23/2020   Thrombocytopenia (HCC) 09/23/2020   Dehydration 06/22/2012   AKI (acute kidney injury) (HCC) 06/22/2012   Hypothyroidism 06/22/2012   HTN (hypertension) 06/22/2012   Uncontrolled type 2 diabetes mellitus with hyperglycemia (HCC) 06/22/2012   Sarcoidosis 06/22/2012   Heart murmur 06/22/2012   UTI (urinary tract infection) 06/22/2012    REFERRING DIAG: K34.917 (ICD-10-CM) - S/P total knee arthroplasty, right M17.12 (ICD-10-CM) - Primary osteoarthritis of left knee R26.81 (ICD-10-CM) - Unstable gait  THERAPY DIAG:  Right knee pain, unspecified chronicity  Left knee pain, unspecified chronicity  Other abnormalities of gait and mobility  Muscle weakness (generalized)  Rationale for Evaluation and Treatment Rehabilitation  PERTINENT HISTORY: HTN, DM2, R TKA (may 2023), CAD, CHF, HTN   PRECAUTIONS: fall risk  SUBJECTIVE:  SUBJECTIVE STATEMENT:   Pt arrives w/ 2-3/10 pain, continues to report good adherence to HEP. States she felt good after last session, no significant pain/soreness. Continues to have intermittent flareups but feels she is improving with transfers and stair navigation   PAIN:  Are you having pain: yes 2-3/10 B knee Location: B knees L>R, anterior and medial How would you describe your pain? burning Best in past week: 0/10 Worst in past week: 7/10 Aggravating factors: transfers, walking/standing (variable, sometimes has difficulty w/  household distance), difficulty w/ stairs (more so descending) Easing factors: rest, heating pad, walking (sometimes)     OBJECTIVE: (objective measures completed at initial evaluation unless otherwise dated)   DIAGNOSTIC FINDINGS: none recently   PATIENT SURVEYS:  FOTO 47% initial, 53% predicted   COGNITION: Overall cognitive status: Within functional limits for tasks assessed                         SENSATION: Grossly intact aside from numbness along medial/anterior R knee which pt states has been consistent since TKA; denies other sensory symptoms     PALPATION: RLE: TTP distal quad, gradually improving proximally LLE: TTP lateral/medial joint lines No patellar tenderness   LOWER EXTREMITY ROM:   Active ROM Right eval Left eval  Hip flexion      Hip extension      Hip abduction      Hip adduction      Hip internal rotation      Hip external rotation      Knee flexion      Knee extension      Ankle dorsiflexion      Ankle plantarflexion      Ankle inversion      Ankle eversion       (Blank rows = not tested)   LOWER EXTREMITY MMT:   MMT Right eval Left eval  Hip flexion 4- 4  Hip extension      Hip abduction modified sitting 4+ 4+  Hip adduction      Hip internal rotation      Hip external rotation      Knee flexion 3+p! 4-  Knee extension 4 4  Ankle dorsiflexion      Ankle plantarflexion      Ankle inversion      Ankle eversion       (Blank rows = not tested)       FUNCTIONAL TESTS:  30sec STS 3.5 repetitions from standard chair, heavy UE use, knee pain B   GAIT: Distance walked: within clinic Assistive device utilized: None Level of assistance: Complete Independence Comments: antalgic gait R>L, reduced knee ROM throughout all phases of gait (B but more so on R). Mildly flexed trunk posture. Partial step through pattern leading w/ R. Reduced cadence and gait speed.    01/07/22: HR 70s, SpO2 98% w/ SOB in supine position that resolves with  sitting position. VSS throughout remainder of session, no further symptoms.   TODAY'S TREATMENT:  [facility] Adult PT Treatment:                                                DATE: 01/13/22 Therapeutic Exercise: Nustep L6 LE/UE 5min STS from lowest mat + 1 airex 2x8 no UE support, cues for BOS and pacing Standing hamstring curls 2.5# 2x5 each LE, cues for posture Standing 45 deg hip kickbacks x8 each LE cues for posture and form Standing heel raises x15 UE support Adductor isos 2x10 cues for posture LAQ 2x10 cues for pacing Seated heel slides x10 each LE cues for pacing, w/ strap and slider HEP update/review   [facility] Adult PT Treatment:                                                DATE: 01/07/22 Therapeutic Exercise: Nustep L5 LE/UE 5min during subjective Supine heel slide x5 RLE only, discontinued d/t SOB in supine position (vitals stable as above and resolves in seated position) Seated heel slides 2x5 B LE with strap and slider  LAQ x8 B cues for form and quad squeeze  Standing hamstring curls no weight 2x5 each LE  Heel raises x12 cues for velocity  Standing hip kickbacks 45 deg x5 B UE support cues form and posture Adductor isos 2x8 cues for form, posture, and breath control      [facility] Adult PT Treatment:                                                DATE: 01/05/22 Therapeutic Exercise: Nu step LE/UE 5min during subjective Seated heel slides w/ strap + slider x10 B LE cues for setup and pacing  LAQ 2x5 cues for form and quad squeeze  Standing heel raises x12 B UE support 6inch step weight shifts + push downs 2x8 B w/ UE support for functional strength w/ stair navigation Establishing HEP, handout + review    PATIENT EDUCATION:  Education details: rationale for interventions, HEP review/education Person educated: Patient Education method: Explanation, Demonstration, Tactile cues,  Verbal cues, and Handouts Education comprehension: verbalized understanding, returned demonstration, verbal cues required, tactile cues required, and needs further education     HOME EXERCISE PROGRAM: Access Code: [email or web address] URL: [email or web address] Date: 01/13/2022 Prepared by: Fransisco Hertzavid Guilherme Schwenke  Program Notes use chair for sitting heel slide  Exercises - Sitting Heel Slide with Towel  - 1 x daily - 7 x weekly - 3 sets - 10 reps - Seated Long Arc Quad  - 1 x daily - 7 x weekly - 2 sets - 8 reps - Heel Raises with Counter Support  - 1 x daily - 7 x weekly - 2 sets - 12 reps - Standing Alternating Knee Flexion  - 1 x daily - 7 x weekly - 2 sets - 5 reps   ASSESSMENT:   CLINICAL IMPRESSION: 01/13/2022 Pt arrives w/ 2-3/10 pain on NPS, no issues after last session. Good HEP adherence reported. Today was able to progress for increased time spent in closed chain strengthening and progression for intensity of exercise. Pt tolerates session well, cues as above, continues  to be limited by reduced knee mobility and LE strength B. Mild knee discomfort intermittently (L more so than R) that tends to improve with repetition. No adverse events, reports 1-2/10 pain on NPS on departure. HEP updated as above, reviewed and pt verbalizes good understanding. Pt departs today's session in no acute distress, all voiced questions/concerns addressed appropriately from PT perspective.       Eval - Patient is a 71 y.o. woman who was seen today for physical therapy evaluation and treatment for L knee pain and gait issues. Pt reports chronic B knee pain for several years, initially improved after R TKA and subsequent therapy but recently worsening. Pt reports some balance issues but denies falls. Reports variable symptoms but tend to be worst with repetitive weightbearing, most difficulty with sit to stand transfers. Pt reports previously being quite active, enjoys working with horses, but has had to modify  activities due to pain. On exam pt demonstrates notable weakness B LE, R more affected than L, limited by weakness/pain. Gait impairments as above. 30sec sit to stand score indicative of fall risk (<10sec for pt's cohort per [facility]). No adverse events. Significant time spent w/ pt education as above, subsequently will defer HEP establishment until next session. Pt departs today's session in no acute distress, all voiced questions/concerns addressed appropriately from PT perspective.     OBJECTIVE IMPAIRMENTS: Abnormal gait, decreased activity tolerance, decreased balance, decreased endurance, decreased mobility, difficulty walking, decreased ROM, decreased strength, improper body mechanics, and pain.    ACTIVITY LIMITATIONS: carrying, lifting, bending, sitting, standing, squatting, sleeping, stairs, transfers, and locomotion level   PARTICIPATION LIMITATIONS: cleaning, laundry, driving, and community activity   PERSONAL FACTORS: Age, Time since onset of injury/illness/exacerbation, and 3+ comorbidities: HTN, DM, RTKA May 2023, CAD/CHF, HTN  are also affecting patient's functional outcome.    REHAB POTENTIAL: Fair given chronicity and comorbidities   CLINICAL DECISION MAKING: Stable/uncomplicated   EVALUATION COMPLEXITY: Low     GOALS: Goals reviewed with patient? No   SHORT TERM GOALS: Target date: 01/27/2022   Pt will demonstrate appropriate understanding and performance of initially prescribed HEP in order to facilitate improved independence with management of symptoms.  Baseline: HEP provided on eval Goal status: INITIAL    2. Pt will score greater than or equal to 50 on FOTO in order to demonstrate improved perception of function due to symptoms.            Baseline: 47            Goal status: INITIAL    LONG TERM GOALS: Target date: 02/24/2022    Pt will score 53 or greater on FOTO in order to demonstrate improved perception of function due to symptoms.  Baseline: 47 Goal status:  INITIAL   2.  Pt will demonstrate 4+/5 knee flexion/extension MMT B in order to facilitate improved strength for transfers/WB. Baseline: see MMT chart above Goal status: INITIAL   3.  Pt will report ability to stand/walk for up to with less than 3/10 pain on NPS in order to improve tolerance for community ambulation and household tasks. Baseline: variable but up to 7/10 pain with above Goal status: INITIAL   4.  Pt will be able to perform at least 7 repetitions for 30sec sit to stand test in order to indicate reduced fall risk and improved independence w/ transfers. (Per CDC cutoff score for fall risk <10 for women in pt age group) Baseline: 3.5 repetitions w/ knee pain Goal status: INITIAL  5. Pt will be able to ascend/descend 2 standard steps without rail safely w/ less than 3/10 pain on NPS in order to indicate improved safety w/ home entry Baseline: pain/difficulty with entering home Goal status: INITIAL           PLAN:   PT FREQUENCY: 1-2x/week   PT DURATION: 8 weeks   PLANNED INTERVENTIONS: Therapeutic exercises, Therapeutic activity, Neuromuscular re-education, Balance training, Gait training, Patient/Family education, Self Care, Joint mobilization, Stair training, Aquatic Therapy, Dry Needling, Electrical stimulation, Cryotherapy, Moist heat, Taping, Manual therapy, and Re-evaluation   PLAN FOR NEXT SESSION:  Progress ROM/strengthening exercises as able/appropriate, review HEP.    Ashley Murrain PT, DPT 01/13/2022 12:29 PM

## 2022-01-13 ENCOUNTER — Ambulatory Visit: Payer: Medicare Other | Admitting: Physical Therapy

## 2022-01-13 ENCOUNTER — Encounter: Payer: Self-pay | Admitting: Physical Therapy

## 2022-01-13 DIAGNOSIS — M25561 Pain in right knee: Secondary | ICD-10-CM

## 2022-01-13 DIAGNOSIS — M25562 Pain in left knee: Secondary | ICD-10-CM

## 2022-01-13 DIAGNOSIS — R2689 Other abnormalities of gait and mobility: Secondary | ICD-10-CM

## 2022-01-13 DIAGNOSIS — M6281 Muscle weakness (generalized): Secondary | ICD-10-CM

## 2022-01-19 ENCOUNTER — Ambulatory Visit: Payer: Medicare Other | Attending: Cardiology | Admitting: Physical Therapy

## 2022-01-19 ENCOUNTER — Encounter: Payer: Self-pay | Admitting: Physical Therapy

## 2022-01-19 DIAGNOSIS — M6281 Muscle weakness (generalized): Secondary | ICD-10-CM | POA: Diagnosis present

## 2022-01-19 DIAGNOSIS — M25562 Pain in left knee: Secondary | ICD-10-CM | POA: Diagnosis present

## 2022-01-19 DIAGNOSIS — M25561 Pain in right knee: Secondary | ICD-10-CM | POA: Insufficient documentation

## 2022-01-19 DIAGNOSIS — R2689 Other abnormalities of gait and mobility: Secondary | ICD-10-CM | POA: Insufficient documentation

## 2022-01-19 NOTE — Therapy (Signed)
OUTPATIENT PHYSICAL THERAPY TREATMENT NOTE   Patient Name: [name] MRN: [id number] DOB:08-31-1950, 72 y.o., female Today's Date: 01/19/2022  PCP: Ralene Ok, MD   REFERRING PROVIDER: Pollyann Savoy, MD  END OF SESSION:   PT End of Session - 01/19/22 1147     Visit Number 5    Date for PT Re-Evaluation 02/24/22    Authorization Type [employer] medicare    Progress Note Due on Visit 10    PT Start Time 1147    PT Stop Time 1225    PT Time Calculation (min) 38 min    Activity Tolerance Patient tolerated treatment well;No increased pain    Behavior During Therapy WFL for tasks assessed/performed                Past Medical History:  Diagnosis Date   Arthritis    Asthma    CAD (coronary artery disease)    CHF (congestive heart failure) (HCC)    Complication of anesthesia    trouble waking up once   COPD (chronic obstructive pulmonary disease) (HCC)    Diabetes mellitus without complication (HCC)    Dysrhythmia    Heart murmur    Hypertension    Hypothyroidism    Liver disease    NAFLD cirrhosis   Mild aortic stenosis    Pneumonia    Sarcoidosis    Sarcoidosis of lung (HCC)    Sarcoidosis of lymph nodes    Past Surgical History:  Procedure Laterality Date   ABDOMINAL HYSTERECTOMY     ABDOMINAL SURGERY     CHOLECYSTECTOMY     ESOPHAGOGASTRODUODENOSCOPY (EGD) WITH PROPOFOL N/A 04/17/2021   Procedure: ESOPHAGOGASTRODUODENOSCOPY (EGD) WITH PROPOFOL;  Surgeon: Jeani Hawking, MD;  Location: [facility];  Service: Gastroenterology;  Laterality: N/A;   HERNIA REPAIR     LASIK  2001   LEFT HEART CATH AND CORONARY ANGIOGRAPHY N/A 09/23/2020   Procedure: LEFT HEART CATH AND CORONARY ANGIOGRAPHY;  Surgeon: Lennette Bihari, MD;  Location: [facility];  Service: Cardiovascular;  Laterality: N/A;   nissenfundiplication     TOTAL KNEE ARTHROPLASTY Right 05/19/2021   Procedure: TOTAL KNEE ARTHROPLASTY;  Surgeon: Durene Romans, MD;  Location: [facility];  Service:  Orthopedics;  Laterality: Right;   Patient Active Problem List   Diagnosis Date Noted   Arthropathy of cervical facet joint 11/20/2021   Diastolic dysfunction 11/20/2021   Other cirrhosis of liver (HCC) 11/20/2021   S/P total knee arthroplasty, right 05/19/2021   Systolic anterior movement of mitral valve 03/30/2021   Chest pain 09/23/2020   Thrombocytopenia (HCC) 09/23/2020   Dehydration 06/22/2012   AKI (acute kidney injury) (HCC) 06/22/2012   Hypothyroidism 06/22/2012   HTN (hypertension) 06/22/2012   Uncontrolled type 2 diabetes mellitus with hyperglycemia (HCC) 06/22/2012   Sarcoidosis 06/22/2012   Heart murmur 06/22/2012   UTI (urinary tract infection) 06/22/2012    REFERRING DIAG: E42.353 (ICD-10-CM) - S/P total knee arthroplasty, right M17.12 (ICD-10-CM) - Primary osteoarthritis of left knee R26.81 (ICD-10-CM) - Unstable gait  THERAPY DIAG:  Right knee pain, unspecified chronicity  Left knee pain, unspecified chronicity  Other abnormalities of gait and mobility  Muscle weakness (generalized)  Rationale for Evaluation and Treatment Rehabilitation  PERTINENT HISTORY: HTN, DM2, R TKA (may 2023), CAD, CHF, HTN   PRECAUTIONS: fall risk  SUBJECTIVE:  SUBJECTIVE STATEMENT:   Pt arrives w/ 3/10 pain on NPS L knee more than R - pt notes that she felt a bit better after last session but over the weekend had a bit of a flareup, unsure of contributing factors. Good adherence to HEP reported   PAIN:  Are you having pain: yes 3/10 B knee Location: B knees L>R, anterior and medial How would you describe your pain? burning Best in past week: 0/10 Worst in past week: 7/10 Aggravating factors: transfers, walking/standing (variable, sometimes has difficulty w/ household distance), difficulty w/  stairs (more so descending) Easing factors: rest, heating pad, walking (sometimes)     OBJECTIVE: (objective measures completed at initial evaluation unless otherwise dated)   DIAGNOSTIC FINDINGS: none recently   PATIENT SURVEYS:  FOTO 47% initial, 53% predicted   COGNITION: Overall cognitive status: Within functional limits for tasks assessed                         SENSATION: Grossly intact aside from numbness along medial/anterior R knee which pt states has been consistent since TKA; denies other sensory symptoms     PALPATION: RLE: TTP distal quad, gradually improving proximally LLE: TTP lateral/medial joint lines No patellar tenderness   LOWER EXTREMITY ROM:   Active ROM Right eval Left eval  Hip flexion      Hip extension      Hip abduction      Hip adduction      Hip internal rotation      Hip external rotation      Knee flexion      Knee extension      Ankle dorsiflexion      Ankle plantarflexion      Ankle inversion      Ankle eversion       (Blank rows = not tested)   LOWER EXTREMITY MMT:   MMT Right eval Left eval  Hip flexion 4- 4  Hip extension      Hip abduction modified sitting 4+ 4+  Hip adduction      Hip internal rotation      Hip external rotation      Knee flexion 3+p! 4-  Knee extension 4 4  Ankle dorsiflexion      Ankle plantarflexion      Ankle inversion      Ankle eversion       (Blank rows = not tested)       FUNCTIONAL TESTS:  30sec STS 3.5 repetitions from standard chair, heavy UE use, knee pain B   GAIT: Distance walked: within clinic Assistive device utilized: None Level of assistance: Complete Independence Comments: antalgic gait R>L, reduced knee ROM throughout all phases of gait (B but more so on R). Mildly flexed trunk posture. Partial step through pattern leading w/ R. Reduced cadence and gait speed.    01/07/22: HR 70s, SpO2 98% w/ SOB in supine position that resolves with sitting position. VSS throughout  remainder of session, no further symptoms.   TODAY'S TREATMENT:  [facility] Adult PT Treatment:                                                DATE: 01/19/22 Therapeutic Exercise: Nu step LE/UE  Standing hamstring curls 2.5# 3x5 each LE cues for form and posture Standing 45deg kickbacks 2.5# 2x5 each LE cues for reduced trunk lean and pacing  Standing heel raises 2.5# each LE, counter support, 3x8 cues for form  STS 2x8 from raised mat, no UE support significant cues for mechanics and BOS, pacing  Adductor iso w/ ball squeeze 3x10 seated, cues for form and pacing, breath control Seated marches 2x10 each LE, cues for form HEP review   [facility] Adult PT Treatment:                                                DATE: 01/13/22 Therapeutic Exercise: Nustep L6 LE/UE STS from lowest mat + 1 airex 2x8 no UE support, cues for BOS and pacing Standing hamstring curls 2.5# 2x5 each LE, cues for posture Standing 45 deg hip kickbacks x8 each LE cues for posture and form Standing heel raises x15 UE support Adductor isos 2x10 cues for posture LAQ 2x10 cues for pacing Seated heel slides x10 each LE cues for pacing, w/ strap and slider HEP update/review   [facility] Adult PT Treatment:                                                DATE: 01/07/22 Therapeutic Exercise: Nustep L5 LE/UE during subjective Supine heel slide x5 RLE only, discontinued d/t SOB in supine position (vitals stable as above and resolves in seated position) Seated heel slides 2x5 B LE with strap and slider  LAQ x8 B cues for form and quad squeeze  Standing hamstring curls no weight 2x5 each LE  Heel raises x12 cues for velocity  Standing hip kickbacks 45 deg x5 B UE support cues form and posture Adductor isos 2x8 cues for form, posture, and breath control        PATIENT EDUCATION:  Education details: rationale for interventions,  HEP review/education Person educated: Patient Education method: Explanation, Demonstration, Tactile cues, Verbal cues, and Handouts Education comprehension: verbalized understanding, returned demonstration, verbal cues required, tactile cues required, and needs further education     HOME EXERCISE PROGRAM: Access Code: [email or web address] URL: [email or web address] Date: 01/13/2022 Prepared by: Fransisco Hertz  Program Notes use chair for sitting heel slide  Exercises - Sitting Heel Slide with Towel  - 1 x daily - 7 x weekly - 3 sets - 10 reps - Seated Long Arc Quad  - 1 x daily - 7 x weekly - 2 sets - 8 reps - Heel Raises with Counter Support  - 1 x daily - 7 x weekly - 2 sets - 12 reps - Standing Alternating Knee Flexion  - 1 x daily - 7 x weekly - 2 sets - 5 reps   ASSESSMENT:   CLINICAL IMPRESSION: 01/19/2022 Pt arrives w/ 3/10 pain on NPS, felt good after last session but did have a flareup over  the weekend. Continued to progress for increased time spent in WB position, pt tolerates session well with cues as above. Reduced need for rest breaks on this date. L knee remains more irritable than R but tends to improve with repetition. Significant time spent w/ mechanics for STS as pt is noted to have hip shift and trunk rotation towards L, altered BOS; improves significantly with cues and repetition, pt reports improved symptoms with repetition. Improvement in pain to 1/10 on departure mostly in L knee, no adverse events, pt continues to demonstrate improved activity tolerance each session. Pt departs today's session in no acute distress, all voiced questions/concerns addressed appropriately from PT perspective.       Eval - Patient is a 72 y.o. woman who was seen today for physical therapy evaluation and treatment for L knee pain and gait issues. Pt reports chronic B knee pain for several years, initially improved after R TKA and subsequent therapy but recently worsening. Pt reports some  balance issues but denies falls. Reports variable symptoms but tend to be worst with repetitive weightbearing, most difficulty with sit to stand transfers. Pt reports previously being quite active, enjoys working with horses, but has had to modify activities due to pain. On exam pt demonstrates notable weakness B LE, R more affected than L, limited by weakness/pain. Gait impairments as above. 30sec sit to stand score indicative of fall risk (<10sec for pt's cohort per [facility]). No adverse events. Significant time spent w/ pt education as above, subsequently will defer HEP establishment until next session. Pt departs today's session in no acute distress, all voiced questions/concerns addressed appropriately from PT perspective.     OBJECTIVE IMPAIRMENTS: Abnormal gait, decreased activity tolerance, decreased balance, decreased endurance, decreased mobility, difficulty walking, decreased ROM, decreased strength, improper body mechanics, and pain.    ACTIVITY LIMITATIONS: carrying, lifting, bending, sitting, standing, squatting, sleeping, stairs, transfers, and locomotion level   PARTICIPATION LIMITATIONS: cleaning, laundry, driving, and community activity   PERSONAL FACTORS: Age, Time since onset of injury/illness/exacerbation, and 3+ comorbidities: HTN, DM, RTKA May 2023, CAD/CHF, HTN  are also affecting patient's functional outcome.    REHAB POTENTIAL: Fair given chronicity and comorbidities   CLINICAL DECISION MAKING: Stable/uncomplicated   EVALUATION COMPLEXITY: Low     GOALS: Goals reviewed with patient? No   SHORT TERM GOALS: Target date: 01/27/2022   Pt will demonstrate appropriate understanding and performance of initially prescribed HEP in order to facilitate improved independence with management of symptoms.  Baseline: HEP provided on eval Goal status: INITIAL    2. Pt will score greater than or equal to 50 on FOTO in order to demonstrate improved perception of function due to  symptoms.            Baseline: 47            Goal status: INITIAL    LONG TERM GOALS: Target date: 02/24/2022    Pt will score 53 or greater on FOTO in order to demonstrate improved perception of function due to symptoms.  Baseline: 47 Goal status: INITIAL   2.  Pt will demonstrate 4+/5 knee flexion/extension MMT B in order to facilitate improved strength for transfers/WB. Baseline: see MMT chart above Goal status: INITIAL   3.  Pt will report ability to stand/walk for up to 28min with less than 3/10 pain on NPS in order to improve tolerance for community ambulation and household tasks. Baseline: variable but up to 7/10 pain with above Goal status: INITIAL   4.  Pt will be able to perform at least 7 repetitions for 30sec sit to stand test in order to indicate reduced fall risk and improved independence w/ transfers. (Per CDC cutoff score for fall risk <10 for women in pt age group) Baseline: 3.5 repetitions w/ knee pain Goal status: INITIAL   5. Pt will be able to ascend/descend 2 standard steps without rail safely w/ less than 3/10 pain on NPS in order to indicate improved safety w/ home entry Baseline: pain/difficulty with entering home Goal status: INITIAL           PLAN:   PT FREQUENCY: 1-2x/week   PT DURATION: 8 weeks   PLANNED INTERVENTIONS: Therapeutic exercises, Therapeutic activity, Neuromuscular re-education, Balance training, Gait training, Patient/Family education, Self Care, Joint mobilization, Stair training, Aquatic Therapy, Dry Needling, Electrical stimulation, Cryotherapy, Moist heat, Taping, Manual therapy, and Re-evaluation   PLAN FOR NEXT SESSION:   Progress ROM/strengthening exercises as able/appropriate, review HEP.    Leeroy Cha PT, DPT 01/19/2022 12:34 PM

## 2022-01-20 NOTE — Therapy (Signed)
OUTPATIENT PHYSICAL THERAPY TREATMENT NOTE   Patient Name: [name] MRN: [id number] DOB:November 21, 1950, 72 y.o., female Today's Date: 01/21/2022  PCP: Jilda Panda, MD   REFERRING PROVIDER: Bo Merino, MD  END OF SESSION:   PT End of Session - 01/21/22 1141     Visit Number 6    Date for PT Re-Evaluation 02/24/22    Authorization Type [employer] medicare    Progress Note Due on Visit 10    PT Start Time 5701    PT Stop Time 1227    PT Time Calculation (min) 42 min    Activity Tolerance Patient tolerated treatment well;No increased pain    Behavior During Therapy WFL for tasks assessed/performed                 Past Medical History:  Diagnosis Date   Arthritis    Asthma    CAD (coronary artery disease)    CHF (congestive heart failure) (HCC)    Complication of anesthesia    trouble waking up once   COPD (chronic obstructive pulmonary disease) ([place])    Diabetes mellitus without complication ([place])    Dysrhythmia    Heart murmur    Hypertension    Hypothyroidism    Liver disease    NAFLD cirrhosis   Mild aortic stenosis    Pneumonia    Sarcoidosis    Sarcoidosis of lung ([place])    Sarcoidosis of lymph nodes    Past Surgical History:  Procedure Laterality Date   ABDOMINAL HYSTERECTOMY     ABDOMINAL SURGERY     CHOLECYSTECTOMY     ESOPHAGOGASTRODUODENOSCOPY (EGD) WITH PROPOFOL N/A 04/17/2021   Procedure: ESOPHAGOGASTRODUODENOSCOPY (EGD) WITH PROPOFOL;  Surgeon: Carol Ada, MD;  Location: [facility];  Service: Gastroenterology;  Laterality: N/A;   HERNIA REPAIR     LASIK  2001   LEFT HEART CATH AND CORONARY ANGIOGRAPHY N/A 09/23/2020   Procedure: LEFT HEART CATH AND CORONARY ANGIOGRAPHY;  Surgeon: Troy Sine, MD;  Location: [facility];  Service: Cardiovascular;  Laterality: N/A;   nissenfundiplication     TOTAL KNEE ARTHROPLASTY Right 05/19/2021   Procedure: TOTAL KNEE ARTHROPLASTY;  Surgeon: Paralee Cancel, MD;  Location: [facility];  Service:  Orthopedics;  Laterality: Right;   Patient Active Problem List   Diagnosis Date Noted   Arthropathy of cervical facet joint 77/93/9030   Diastolic dysfunction 10/10/3005   Other cirrhosis of liver ([place]) 11/20/2021   S/P total knee arthroplasty, right 62/26/3335   Systolic anterior movement of mitral valve 03/30/2021   Chest pain 09/23/2020   Thrombocytopenia ([place]) 09/23/2020   Dehydration 06/22/2012   AKI (acute kidney injury) ([place]) 06/22/2012   Hypothyroidism 06/22/2012   HTN (hypertension) 06/22/2012   Uncontrolled type 2 diabetes mellitus with hyperglycemia ([place]) 06/22/2012   Sarcoidosis 06/22/2012   Heart murmur 06/22/2012   UTI (urinary tract infection) 06/22/2012    REFERRING DIAG: K56.256 (ICD-10-CM) - S/P total knee arthroplasty, right M17.12 (ICD-10-CM) - Primary osteoarthritis of left knee R26.81 (ICD-10-CM) - Unstable gait  THERAPY DIAG:  Right knee pain, unspecified chronicity  Left knee pain, unspecified chronicity  Other abnormalities of gait and mobility  Muscle weakness (generalized)  Rationale for Evaluation and Treatment Rehabilitation  PERTINENT HISTORY: HTN, DM2, R TKA (may 2023), CAD, CHF, HTN   PRECAUTIONS: fall risk  SUBJECTIVE:  SUBJECTIVE STATEMENT:   Arrives w/ 2/10 pain B, had a little transient pain/soreness after last session that improved next day, has had improved symptoms overall. Describes her pain as a "band" across B anterior knees. Good HEP adherence reported   PAIN:  Are you having pain: yes 2/10 B knee Location: B knees L>R, anterior and medial How would you describe your pain? burning Best in past week: 0/10 Worst in past week: 7/10 Aggravating factors: transfers, walking/standing (variable, sometimes has difficulty w/ household distance), difficulty  w/ stairs (more so descending) Easing factors: rest, heating pad, walking (sometimes)     OBJECTIVE: (objective measures completed at initial evaluation unless otherwise dated)   DIAGNOSTIC FINDINGS: none recently   PATIENT SURVEYS:  FOTO 47% initial, 53% predicted   COGNITION: Overall cognitive status: Within functional limits for tasks assessed                         SENSATION: Grossly intact aside from numbness along medial/anterior R knee which pt states has been consistent since TKA; denies other sensory symptoms     PALPATION: RLE: TTP distal quad, gradually improving proximally LLE: TTP lateral/medial joint lines No patellar tenderness   LOWER EXTREMITY ROM:   Active ROM Right eval Left eval  Hip flexion      Hip extension      Hip abduction      Hip adduction      Hip internal rotation      Hip external rotation      Knee flexion      Knee extension      Ankle dorsiflexion      Ankle plantarflexion      Ankle inversion      Ankle eversion       (Blank rows = not tested)   LOWER EXTREMITY MMT:   MMT Right eval Left eval  Hip flexion 4- 4  Hip extension      Hip abduction modified sitting 4+ 4+  Hip adduction      Hip internal rotation      Hip external rotation      Knee flexion 3+p! 4-  Knee extension 4 4  Ankle dorsiflexion      Ankle plantarflexion      Ankle inversion      Ankle eversion       (Blank rows = not tested)       FUNCTIONAL TESTS:  30sec STS 3.5 repetitions from standard chair, heavy UE use, knee pain B   GAIT: Distance walked: within clinic Assistive device utilized: None Level of assistance: Complete Independence Comments: antalgic gait R>L, reduced knee ROM throughout all phases of gait (B but more so on R). Mildly flexed trunk posture. Partial step through pattern leading w/ R. Reduced cadence and gait speed.    01/07/22: HR 70s, SpO2 98% w/ SOB in supine position that resolves with sitting position. VSS throughout  remainder of session, no further symptoms.   TODAY'S TREATMENT:  [facility] Adult PT Treatment:                                                DATE: 01/21/22 Therapeutic Exercise: Nu step LE/UE during subjective 4inch weight shift + push down 2x5 each LE w/ UE support, cues for isometric push and appropriate lean  Standing hamstring curls no weight 2x10 each LE cues for posture Standing hip kickbacks 45 deg 2x8 each LE cues for form and reduced lean  Standing heel raies x15 B UE support STS 2x8 from lowest mat + airex  cues for BOS and full extension Seated marches 3x8 cues for form and pacing  HEP review   [facility] Adult PT Treatment:                                                DATE: 01/19/22 Therapeutic Exercise: Nu step LE/UE  Standing hamstring curls 2.5# 3x5 each LE cues for form and posture Standing 45deg kickbacks 2.5# 2x5 each LE cues for reduced trunk lean and pacing  Standing heel raises 2.5# each LE, counter support, 3x8 cues for form  STS 2x8 from raised mat, no UE support significant cues for mechanics and BOS, pacing  Adductor iso w/ ball squeeze 3x10 seated, cues for form and pacing, breath control Seated marches 2x10 each LE, cues for form HEP review   [facility] Adult PT Treatment:                                                DATE: 01/13/22 Therapeutic Exercise: Nustep L6 LE/UE STS from lowest mat + 1 airex 2x8 no UE support, cues for BOS and pacing Standing hamstring curls 2.5# 2x5 each LE, cues for posture Standing 45 deg hip kickbacks x8 each LE cues for posture and form Standing heel raises x15 UE support Adductor isos 2x10 cues for posture LAQ 2x10 cues for pacing Seated heel slides x10 each LE cues for pacing, w/ strap and slider HEP update/review    PATIENT EDUCATION:  Education details: rationale for interventions, HEP review/education Person educated:  Patient Education method: Explanation, Demonstration, Tactile cues, Verbal cues, and Handouts Education comprehension: verbalized understanding, returned demonstration, verbal cues required, tactile cues required, and needs further education     HOME EXERCISE PROGRAM: Access Code: [email or web address] URL: [email or web address] Date: 01/13/2022 Prepared by: Fransisco Hertz  Program Notes use chair for sitting heel slide  Exercises - Sitting Heel Slide with Towel  - 1 x daily - 7 x weekly - 3 sets - 10 reps - Seated Long Arc Quad  - 1 x daily - 7 x weekly - 2 sets - 8 reps - Heel Raises with Counter Support  - 1 x daily - 7 x weekly - 2 sets - 12 reps - Standing Alternating Knee Flexion  - 1 x daily - 7 x weekly - 2 sets - 5 reps   ASSESSMENT:   CLINICAL IMPRESSION: 01/21/2022 Pt arrives w/ report of 2/10 pain at present, overall improving despite mild increase in pain/soreness after last session. Today's session continues to emphasis progression  for open/closed chain LE strength and endurance, reduced weight and instead focusing on increased repetition for endurance. No adverse events, pt tolerates session well, reports 0/10 pain on departure but does endorse fatigue/soreness. Remains limited by gait impairments and reduced LE strength/mobility, although tolerance to activities improves each session. Pt departs today's session in no acute distress, all voiced questions/concerns addressed appropriately from PT perspective.    Eval - Patient is a 72 y.o. woman who was seen today for physical therapy evaluation and treatment for L knee pain and gait issues. Pt reports chronic B knee pain for several years, initially improved after R TKA and subsequent therapy but recently worsening. Pt reports some balance issues but denies falls. Reports variable symptoms but tend to be worst with repetitive weightbearing, most difficulty with sit to stand transfers. Pt reports previously being quite active,  enjoys working with horses, but has had to modify activities due to pain. On exam pt demonstrates notable weakness B LE, R more affected than L, limited by weakness/pain. Gait impairments as above. 30sec sit to stand score indicative of fall risk (<10sec for pt's cohort per [facility]). No adverse events. Significant time spent w/ pt education as above, subsequently will defer HEP establishment until next session. Pt departs today's session in no acute distress, all voiced questions/concerns addressed appropriately from PT perspective.     OBJECTIVE IMPAIRMENTS: Abnormal gait, decreased activity tolerance, decreased balance, decreased endurance, decreased mobility, difficulty walking, decreased ROM, decreased strength, improper body mechanics, and pain.    ACTIVITY LIMITATIONS: carrying, lifting, bending, sitting, standing, squatting, sleeping, stairs, transfers, and locomotion level   PARTICIPATION LIMITATIONS: cleaning, laundry, driving, and community activity   PERSONAL FACTORS: Age, Time since onset of injury/illness/exacerbation, and 3+ comorbidities: HTN, DM, RTKA May 2023, CAD/CHF, HTN  are also affecting patient's functional outcome.    REHAB POTENTIAL: Fair given chronicity and comorbidities   CLINICAL DECISION MAKING: Stable/uncomplicated   EVALUATION COMPLEXITY: Low     GOALS: Goals reviewed with patient? No   SHORT TERM GOALS: Target date: 01/27/2022   Pt will demonstrate appropriate understanding and performance of initially prescribed HEP in order to facilitate improved independence with management of symptoms.  Baseline: HEP provided on eval Goal status: INITIAL    2. Pt will score greater than or equal to 50 on FOTO in order to demonstrate improved perception of function due to symptoms.            Baseline: 47            Goal status: INITIAL    LONG TERM GOALS: Target date: 02/24/2022    Pt will score 53 or greater on FOTO in order to demonstrate improved perception of function  due to symptoms.  Baseline: 47 Goal status: INITIAL   2.  Pt will demonstrate 4+/5 knee flexion/extension MMT B in order to facilitate improved strength for transfers/WB. Baseline: see MMT chart above Goal status: INITIAL   3.  Pt will report ability to stand/walk for up to 41min with less than 3/10 pain on NPS in order to improve tolerance for community ambulation and household tasks. Baseline: variable but up to 7/10 pain with above Goal status: INITIAL   4.  Pt will be able to perform at least 7 repetitions for 30sec sit to stand test in order to indicate reduced fall risk and improved independence w/ transfers. (Per CDC cutoff score for fall risk <10 for women in pt age group) Baseline: 3.5 repetitions w/ knee pain Goal status: INITIAL  5. Pt will be able to ascend/descend 2 standard steps without rail safely w/ less than 3/10 pain on NPS in order to indicate improved safety w/ home entry Baseline: pain/difficulty with entering home Goal status: INITIAL      PLAN:   PT FREQUENCY: 1-2x/week   PT DURATION: 8 weeks   PLANNED INTERVENTIONS: Therapeutic exercises, Therapeutic activity, Neuromuscular re-education, Balance training, Gait training, Patient/Family education, Self Care, Joint mobilization, Stair training, Aquatic Therapy, Dry Needling, Electrical stimulation, Cryotherapy, Moist heat, Taping, Manual therapy, and Re-evaluation   PLAN FOR NEXT SESSION:   Progress ROM/strengthening exercises as able/appropriate, review HEP. Plan to incorporate more functional mobility training   Ashley Murrain PT, DPT 01/21/2022 12:35 PM

## 2022-01-21 ENCOUNTER — Ambulatory Visit: Payer: Medicare Other | Admitting: Physical Therapy

## 2022-01-21 ENCOUNTER — Encounter: Payer: Self-pay | Admitting: Physical Therapy

## 2022-01-21 DIAGNOSIS — M6281 Muscle weakness (generalized): Secondary | ICD-10-CM

## 2022-01-21 DIAGNOSIS — M25561 Pain in right knee: Secondary | ICD-10-CM | POA: Diagnosis not present

## 2022-01-21 DIAGNOSIS — M25562 Pain in left knee: Secondary | ICD-10-CM

## 2022-01-21 DIAGNOSIS — R2689 Other abnormalities of gait and mobility: Secondary | ICD-10-CM

## 2022-01-21 NOTE — Progress Notes (Signed)
Cardiology Office Note:    Date:  01/24/2022   ID:  [name], DOB 06/26/1950, MRN [id number]  PCP:  Jilda Panda, MD  Cardiologist:  Buford Dresser, MD PhD  Referring MD: Jilda Panda, MD   CC: follow-up  History of Present Illness:    [name] is a 72 y.o. female with a hx of type II diabetes, hypertension, sarcoidosis who is seen for follow up. She was initially seen 02/08/18 at the request of Jilda Panda, MD for the evaluation and management of chest pain.  She presented to the ED 09/25/2021 with complaints of chest pain with palpitation to her right chest wall and right upper quadrant. Sent from her PCP when her d-dimer was positive She received a Toradol shot a day prior at her office which significantly improved pain. She stated that pain was worse when lying flat. EKG was normal sinus rhythm with nonspecific ST/T wave changes in lateral leads. CTA chest PE and CT abdomen pelvis were unremarkable.  At a previous visit her blood pressure was averaging 433-295 systolic at home, and was over 188 systolic in clinic. Only needed prn hydralazine a few times/month. We stopped hydralazine given abnormal ANA.  Today, she is feeling well. She reports her blood pressures have been great. Per her log, home blood pressures ranging 104/68 to 176/75. Most of her readings average [id number] systolic. At one time she experienced mild heaviness in her chest associated with high blood pressure. This was brief and resolved with rest.  Additionally she complains of swelling in her legs, but not her feet. Sometimes she feels that her legs are tight. Overall she feels as though her edema is manageable. She has been working with physical therapy regarding gait instability and difficulty with moving from chairs. She believes this may be positively affecting her leg swelling.  She denies any palpitations, or shortness of breath. No lightheadedness, headaches, syncope, orthopnea, or PND.   Past  Medical History:  Diagnosis Date   Arthritis    Asthma    CAD (coronary artery disease)    CHF (congestive heart failure) (HCC)    Complication of anesthesia    trouble waking up once   COPD (chronic obstructive pulmonary disease) ([place])    Diabetes mellitus without complication ([place])    Dysrhythmia    Heart murmur    Hypertension    Hypothyroidism    Liver disease    NAFLD cirrhosis   Mild aortic stenosis    Pneumonia    Sarcoidosis    Sarcoidosis of lung ([place])    Sarcoidosis of lymph nodes     Past Surgical History:  Procedure Laterality Date   ABDOMINAL HYSTERECTOMY     ABDOMINAL SURGERY     CHOLECYSTECTOMY     ESOPHAGOGASTRODUODENOSCOPY (EGD) WITH PROPOFOL N/A 04/17/2021   Procedure: ESOPHAGOGASTRODUODENOSCOPY (EGD) WITH PROPOFOL;  Surgeon: Carol Ada, MD;  Location: [facility];  Service: Gastroenterology;  Laterality: N/A;   HERNIA REPAIR     LASIK  2001   LEFT HEART CATH AND CORONARY ANGIOGRAPHY N/A 09/23/2020   Procedure: LEFT HEART CATH AND CORONARY ANGIOGRAPHY;  Surgeon: Troy Sine, MD;  Location: [facility];  Service: Cardiovascular;  Laterality: N/A;   nissenfundiplication     TOTAL KNEE ARTHROPLASTY Right 05/19/2021   Procedure: TOTAL KNEE ARTHROPLASTY;  Surgeon: Paralee Cancel, MD;  Location: [facility];  Service: Orthopedics;  Laterality: Right;    Current Medications: Current Outpatient Medications on File Prior to Visit  Medication Sig  acyclovir (ZOVIRAX) 400 MG tablet Take 400 mg by mouth every 8 (eight) hours as needed. Fever blisters   albuterol (VENTOLIN HFA) 108 (90 Base) MCG/ACT inhaler Inhale 2 puffs into the lungs every 6 (six) hours as needed.   amLODipine (NORVASC) 10 MG tablet Take 1 tablet (10 mg total) by mouth daily.   calcium-vitamin D (OSCAL WITH D) 500-200 MG-UNIT tablet Take 1 tablet by mouth daily with breakfast.   carvedilol (COREG) 25 MG tablet Take 1 tablet (25 mg total) by mouth 2 (two) times daily with a meal.    cetirizine (ZYRTEC) 10 MG tablet Take 10 mg by mouth daily.   docusate sodium (COLACE) 100 MG capsule Take 1 capsule (100 mg total) by mouth 2 (two) times daily.   furosemide (LASIX) 20 MG tablet Take 20-40 mg by mouth See admin instructions. Taking 20 mg every Sun., Tues., Thurs., and Sat. Taking 40 mg all other days.   gabapentin (NEURONTIN) 300 MG capsule Take 300 mg by mouth as needed.   hydrALAZINE (APRESOLINE) 25 MG tablet Take 1 tablet by mouth as needed.   irbesartan (AVAPRO) 150 MG tablet Take 1 tablet (150 mg total) by mouth daily.   levothyroxine (SYNTHROID, LEVOTHROID) 150 MCG tablet Take 75-150 mcg by mouth See admin instructions. Pt alternates between 150 mcg and 75 mcg each day with before breakfast   metFORMIN (GLUCOPHAGE-XR) 500 MG 24 hr tablet Take 2 tablets (1,000 mg total) by mouth 2 (two) times daily. (Patient taking differently: Take 1,000 mg by mouth daily with breakfast.)   rosuvastatin (CRESTOR) 10 MG tablet Take 5 mg by mouth daily.   Semaglutide, 1 MG/DOSE, (OZEMPIC, 1 MG/DOSE,) 4 MG/3ML SOPN Inject 1 mg into the skin once a week. Pt take sometimes   spironolactone (ALDACTONE) 25 MG tablet Take 25 mg by mouth daily.   zolpidem (AMBIEN) 10 MG tablet Take 10 mg by mouth at bedtime.   No current facility-administered medications on file prior to visit.     Allergies:   Sulfa antibiotics, Atorvastatin, and Amoxicillin   Social History   Socioeconomic History   Marital status: Married    Spouse name: Not on file   Number of children: Not on file   Years of education: Not on file   Highest education level: Not on file  Occupational History   Not on file  Tobacco Use   Smoking status: Never    Passive exposure: Past (minimal)   Smokeless tobacco: Never  Vaping Use   Vaping Use: Never used  Substance and Sexual Activity   Alcohol use: No   Drug use: No   Sexual activity: Not Currently  Other Topics Concern   Not on file  Social History Narrative   Not on  file   Social Determinants of Health   Financial Resource Strain: Not on file  Food Insecurity: Not on file  Transportation Needs: Not on file  Physical Activity: Not on file  Stress: Not on file  Social Connections: Not on file   Former nurse, 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  Family History: The patient's family history includes Blindness in her sister; Diabetes in her mother and sister; Healthy in her son; Heart Problems in her sister; Heart attack in her brother, brother, father, and mother; Heart failure in her father; Hypertension in her father and sister; Rheum arthritis in her daughter; Throat cancer in her brother.  ROS:   Please see the history of present illness.   (+)  Isolated episode of chest heaviness (+) BLE edema Additional pertinent ROS otherwise negative.   EKGs/Labs/Other Studies Reviewed:    The following studies were reviewed today:  CTA Chest  09/25/2021: IMPRESSION: 1. Negative for acute pulmonary embolus. Trace pleural effusions. No focal airspace disease. 2. No CT evidence for acute intra-abdominal or pelvic abnormality. 3. Hepatic cirrhosis.  The spleen appears slightly enlarged 4. Status post cholecystectomy with slight interval enlargement of the common bile duct, correlate with LFTs with follow-up MRCP of suggestive of obstruction 5. Small free fluid in the abdomen and pelvis  RLE Venous Doppler 10/09/2020: Summary:  RIGHT:  - No evidence of deep vein thrombosis in the lower extremity. No indirect  evidence of obstruction proximal to the inguinal ligament.  - No cystic structure found in the popliteal fossa.     LEFT:  - No evidence of common femoral vein obstruction.    Cardiac MRI 09/25/20 IMPRESSION: Asymmetric septal hypertrophy without other evidence of infiltrative disease and systolic anterior motion of the mitral valve: This can be seen in hypertropic obstructive cardiomyopathy.   Moderate mitral  regurgitation.  L Heart Cath 09/23/20   Prox Cx lesion is 20% stenosed.   Mild nonobstructive CAD with tortuous vessels and smooth 20% narrowing in the proximal circumflex far artery.  The LAD appears angiographically normal.  The right coronary artery is a dominant vessel with superior takeoff.   Mild LV to aorta gradient on pullback with a peak to peak gradient at approximately 16 mmHg.   RECOMMENDATION: Medical therapy.  Echo 09/23/20  1. Left ventricular ejection fraction, by estimation, is 65 to 70%. The  left ventricle has normal function. The left ventricle has no regional  wall motion abnormalities. Left ventricular diastolic parameters are  consistent with Grade II diastolic  dysfunction (pseudonormalization).   2. Right ventricular systolic function is normal. The right ventricular  size is normal.   3. Left atrial size was mildly dilated.   4. There is systolic anterior motion of the MV leaflet. . The mitral  valve is normal in structure. Moderate mitral valve regurgitation.   5. The aortic valve is normal in structure. Aortic valve regurgitation is  not visualized.   Monitor 02/23/18 3 days of data recorded on Zio monitor. Patient had a min HR of 58 bpm, max HR of 129 bpm, and avg HR of 75 bpm. Predominant underlying rhythm was Sinus Rhythm. No VT, SVT, atrial fibrillation, high degree block, or pauses noted. Isolated atrial ectopy was rare (<1%). Overall PVC burden was 4.8%, with rare couplets. Longest ventricular bigeminy episode was 11 seconds, and longest ventricular trigeminy episode was 1 minute 50 seconds. There were no patient triggered events.   Lexiscan 02/23/18 Nuclear stress EF: 63%. The study is normal. This is a low risk study. The left ventricular ejection fraction is normal (55-65%).   Normal stress nuclear study with no ischemia or infarction; EF 63 with normal wall motion.   Echo 2014 Left ventricle: The cavity size was normal. Wall thickness   was  increased in a pattern of moderate LVH. Systolic   function was vigorous. The estimated ejection fraction was   in the range of 65% to 70%. Wall motion was normal; there   were no regional wall motion abnormalities. - Mitral valve: Mild regurgitation.  EKG:  EKG is personally reviewed. 01/22/2022:  EKG was not ordered. 12/02/2021:  EKG was not ordered. 05/08/21: NSR at 70 bpm 02/08/18: normal sinus rhythm, borderline LVH criteria  Recent Labs:  09/25/2021: B Natriuretic Peptide 68.2 12/16/2021: ALT 25; BUN 13; Creatinine 0.79; Hemoglobin 11.5; Platelet Count 95; Potassium 3.8; Sodium 140   Recent Lipid Panel    Component Value Date/Time   CHOL 116 09/23/2020 0210   TRIG 24 09/23/2020 0210   HDL 48 09/23/2020 0210   CHOLHDL 2.4 09/23/2020 0210   VLDL 5 09/23/2020 0210   LDLCALC 63 09/23/2020 0210    Physical Exam:    VS:  BP 135/62 (BP Location: Right Arm, Patient Position: Standing, Cuff Size: Large)   Pulse 63   Ht 5' 2.5" (1.588 m)   Wt 178 lb 3.2 oz (80.8 kg)   SpO2 97%   BMI 32.07 kg/m     Wt Readings from Last 3 Encounters:  01/22/22 178 lb 3.2 oz (80.8 kg)  12/22/21 171 lb (77.6 kg)  12/16/21 170 lb 8 oz (77.3 kg)     GEN: Well nourished, well developed in no acute distress HEENT: Normal NECK: No JVD; No carotid bruits LYMPHATICS: No lymphadenopathy CARDIAC: regular rhythm, normal S1 and S2, no rubs, gallops. 1/6 holosytolic murmur, does not change with valsalva. Radial and DP pulses 2+ bilaterally. RESPIRATORY:  Clear to auscultation without rales, wheezing or rhonchi  ABDOMEN: Soft, non-tender, non-distended MUSCULOSKELETAL:  No edema; No deformity  SKIN: Warm and dry NEUROLOGIC:  Alert and oriented x 3 PSYCHIATRIC:  Normal affect   ASSESSMENT:    1. Essential hypertension   2. Nonocclusive coronary atherosclerosis of native coronary artery   3. LVH (left ventricular hypertrophy)   4. Nonrheumatic mitral valve regurgitation   5. Positive ANA (antinuclear  antibody)    PLAN:    Labile hypertension -home blood pressure monitoring reviewed , much improved with spironolactone -continue amlodipine 10 mg daily, carvedilol 25 mg BID, furosemide 20-40 mg based on the day, irbesartan 150 mg daily, spironolactone 25 mg daily -see prior notes; would not use hydralazine unless blood pressure severely elevated until we have a better understanding of her ANA. She has not required since our last visit  Nonobstructive CAD by cath Type II diabetes -no angina -continue aspirin, rosuvastatin -on semaglutide (GLP1RA)  Mild aortic stenosis Moderate mitral regurgitation, systolic anterior motion of the mitral valve with asymmetric septal hypertrophy -murmur does not change with valsalva -heart rate is 70s-80s -given SAM/LVOT flow, increased carvedilol, now doing great without issues. Murmur quieter. -continue furosemide, irbesartan. Avoid dehydration/hyperdynamic LV given septal hypertrophy/SAM -no syncope  Plan for follow up: 6 months, or sooner as needed.  Medication Adjustments/Labs and Tests Ordered: Current medicines are reviewed at length with the patient today.  Concerns regarding medicines are outlined above.   No orders of the defined types were placed in this encounter.  No orders of the defined types were placed in this encounter.  Patient Instructions  Medication Instructions:  Your physician recommends that you continue on your current medications as directed. Please refer to the Current Medication list given to you today.  *If you need a refill on your cardiac medications before your next appointment, please call your pharmacy*  Lab Work: NONE  Testing/Procedures: NONE  Follow-Up: At [facility], you and your health needs are our priority.  As part of our continuing mission to provide you with exceptional heart care, we have created designated Provider Care Teams.  These Care Teams include your primary Cardiologist  (physician) and Advanced Practice Providers (APPs -  Physician Assistants and Nurse Practitioners) who all work together to provide you with the care you need, when  you need it.  We recommend signing up for the patient portal called "MyChart".  Sign up information is provided on this After Visit Summary.  MyChart is used to connect with patients for Virtual Visits (Telemedicine).  Patients are able to view lab/test results, encounter notes, upcoming appointments, etc.  Non-urgent messages can be sent to your provider as well.   To learn more about what you can do with MyChart, go to [email or web address].    Your next appointment:   6 month(s)  The format for your next appointment:   In Person  Provider:   Buford Dresser, MD      [facility] Stumpf,acting as a scribe for Buford Dresser, MD.,have documented all relevant documentation on the behalf of Buford Dresser, MD,as directed by  Buford Dresser, MD while in the presence of Buford Dresser, MD.  I, Buford Dresser, MD, have reviewed all documentation for this visit. The documentation on 01/24/22 for the exam, diagnosis, procedures, and orders are all accurate and complete.   Signed, Buford Dresser, MD PhD 01/24/2022 9:57 PM    [place]

## 2022-01-22 ENCOUNTER — Ambulatory Visit (HOSPITAL_BASED_OUTPATIENT_CLINIC_OR_DEPARTMENT_OTHER): Payer: Medicare Other | Admitting: Cardiology

## 2022-01-22 ENCOUNTER — Encounter (HOSPITAL_BASED_OUTPATIENT_CLINIC_OR_DEPARTMENT_OTHER): Payer: Self-pay | Admitting: Cardiology

## 2022-01-22 VITALS — BP 135/62 | HR 63 | Ht 62.5 in | Wt 178.2 lb

## 2022-01-22 DIAGNOSIS — R768 Other specified abnormal immunological findings in serum: Secondary | ICD-10-CM

## 2022-01-22 DIAGNOSIS — I10 Essential (primary) hypertension: Secondary | ICD-10-CM | POA: Diagnosis not present

## 2022-01-22 DIAGNOSIS — I517 Cardiomegaly: Secondary | ICD-10-CM | POA: Diagnosis not present

## 2022-01-22 DIAGNOSIS — I2510 Atherosclerotic heart disease of native coronary artery without angina pectoris: Secondary | ICD-10-CM | POA: Diagnosis not present

## 2022-01-22 DIAGNOSIS — I340 Nonrheumatic mitral (valve) insufficiency: Secondary | ICD-10-CM

## 2022-01-22 NOTE — Patient Instructions (Signed)
Medication Instructions:  Your physician recommends that you continue on your current medications as directed. Please refer to the Current Medication list given to you today.   *If you need a refill on your cardiac medications before your next appointment, please call your pharmacy*  Lab Work: NONE  Testing/Procedures: NONE   Follow-Up: At [facility], you and your health needs are our priority.  As part of our continuing mission to provide you with exceptional heart care, we have created designated Provider Care Teams.  These Care Teams include your primary Cardiologist (physician) and Advanced Practice Providers (APPs -  Physician Assistants and Nurse Practitioners) who all work together to provide you with the care you need, when you need it.  We recommend signing up for the patient portal called "MyChart".  Sign up information is provided on this After Visit Summary.  MyChart is used to connect with patients for Virtual Visits (Telemedicine).  Patients are able to view lab/test results, encounter notes, upcoming appointments, etc.  Non-urgent messages can be sent to your provider as well.   To learn more about what you can do with MyChart, go to [email or web address].    Your next appointment:   6 month(s)  The format for your next appointment:   In Person  Provider:   Bridgette Christopher, MD             

## 2022-01-24 ENCOUNTER — Encounter (HOSPITAL_BASED_OUTPATIENT_CLINIC_OR_DEPARTMENT_OTHER): Payer: Self-pay | Admitting: Cardiology

## 2022-01-26 ENCOUNTER — Ambulatory Visit: Payer: Medicare Other | Admitting: Physical Therapy

## 2022-01-27 NOTE — Therapy (Signed)
OUTPATIENT PHYSICAL THERAPY TREATMENT NOTE   Patient Name: [name] MRN: [id number] DOB:Nov 14, 1950, 72 y.o., female Today's Date: 01/29/2022  PCP: Jilda Panda, MD   REFERRING PROVIDER: Bo Merino, MD  END OF SESSION:   PT End of Session - 01/29/22 1100     Visit Number 7    Number of Visits --   1-2x/week   Date for PT Re-Evaluation 02/24/22    Authorization Type [employer] medicare    Progress Note Due on Visit 10    PT Start Time 1101    PT Stop Time 1143    PT Time Calculation (min) 42 min    Activity Tolerance Patient tolerated treatment well;No increased pain    Behavior During Therapy WFL for tasks assessed/performed                  Past Medical History:  Diagnosis Date   Arthritis    Asthma    CAD (coronary artery disease)    CHF (congestive heart failure) (HCC)    Complication of anesthesia    trouble waking up once   COPD (chronic obstructive pulmonary disease) ([place])    Diabetes mellitus without complication ([place])    Dysrhythmia    Heart murmur    Hypertension    Hypothyroidism    Liver disease    NAFLD cirrhosis   Mild aortic stenosis    Pneumonia    Sarcoidosis    Sarcoidosis of lung ([place])    Sarcoidosis of lymph nodes    Past Surgical History:  Procedure Laterality Date   ABDOMINAL HYSTERECTOMY     ABDOMINAL SURGERY     CHOLECYSTECTOMY     ESOPHAGOGASTRODUODENOSCOPY (EGD) WITH PROPOFOL N/A 04/17/2021   Procedure: ESOPHAGOGASTRODUODENOSCOPY (EGD) WITH PROPOFOL;  Surgeon: Carol Ada, MD;  Location: [facility];  Service: Gastroenterology;  Laterality: N/A;   HERNIA REPAIR     LASIK  2001   LEFT HEART CATH AND CORONARY ANGIOGRAPHY N/A 09/23/2020   Procedure: LEFT HEART CATH AND CORONARY ANGIOGRAPHY;  Surgeon: Troy Sine, MD;  Location: [facility];  Service: Cardiovascular;  Laterality: N/A;   nissenfundiplication     TOTAL KNEE ARTHROPLASTY Right 05/19/2021   Procedure: TOTAL KNEE ARTHROPLASTY;  Surgeon: Paralee Cancel, MD;  Location: [facility];  Service: Orthopedics;  Laterality: Right;   Patient Active Problem List   Diagnosis Date Noted   Arthropathy of cervical facet joint 76/28/3151   Diastolic dysfunction 76/16/0737   Other cirrhosis of liver ([place]) 11/20/2021   S/P total knee arthroplasty, right 10/62/6948   Systolic anterior movement of mitral valve 03/30/2021   Chest pain 09/23/2020   Thrombocytopenia ([place]) 09/23/2020   Dehydration 06/22/2012   AKI (acute kidney injury) ([place]) 06/22/2012   Hypothyroidism 06/22/2012   HTN (hypertension) 06/22/2012   Uncontrolled type 2 diabetes mellitus with hyperglycemia ([place]) 06/22/2012   Sarcoidosis 06/22/2012   Heart murmur 06/22/2012   UTI (urinary tract infection) 06/22/2012    REFERRING DIAG: N46.270 (ICD-10-CM) - S/P total knee arthroplasty, right M17.12 (ICD-10-CM) - Primary osteoarthritis of left knee R26.81 (ICD-10-CM) - Unstable gait  THERAPY DIAG:  Right knee pain, unspecified chronicity  Left knee pain, unspecified chronicity  Other abnormalities of gait and mobility  Muscle weakness (generalized)  Rationale for Evaluation and Treatment Rehabilitation  PERTINENT HISTORY: HTN, DM2, R TKA (may 2023), CAD, CHF, HTN   PRECAUTIONS: fall risk  SUBJECTIVE:  SUBJECTIVE STATEMENT:   Pt arrives with increased pain in R knee over past 3-4 days, unsure of what has contributed to exacerbation, had some soreness after last session. Pt endorses some stomach illness a few days ago and that seems to have coincided with increase in pain. Pt states she was still able to perform HEP, were helpful at times but did seem to increase pain at other times, says she may have been doing increased repetitions   PAIN:  Are you having pain: 01/29/2022 yes 4-5/10 B knee Location: B  knees L>R, anterior and medial How would you describe your pain? burning Best in past week: 0/10 Worst in past week: 7/10 Aggravating factors: transfers, walking/standing (variable, sometimes has difficulty w/ household distance), difficulty w/ stairs (more so descending) Easing factors: rest, heating pad, walking (sometimes)     OBJECTIVE: (objective measures completed at initial evaluation unless otherwise dated)   DIAGNOSTIC FINDINGS: none recently   PATIENT SURVEYS:  FOTO 47% initial, 53% predicted   COGNITION: Overall cognitive status: Within functional limits for tasks assessed                         SENSATION: Grossly intact aside from numbness along medial/anterior R knee which pt states has been consistent since TKA; denies other sensory symptoms     PALPATION: RLE: TTP distal quad, gradually improving proximally LLE: TTP lateral/medial joint lines No patellar tenderness   LOWER EXTREMITY ROM:   Active ROM Right eval Left eval  Hip flexion      Hip extension      Hip abduction      Hip adduction      Hip internal rotation      Hip external rotation      Knee flexion      Knee extension      Ankle dorsiflexion      Ankle plantarflexion      Ankle inversion      Ankle eversion       (Blank rows = not tested)   LOWER EXTREMITY MMT:   MMT Right eval Left eval  Hip flexion 4- 4  Hip extension      Hip abduction modified sitting 4+ 4+  Hip adduction      Hip internal rotation      Hip external rotation      Knee flexion 3+p! 4-  Knee extension 4 4  Ankle dorsiflexion      Ankle plantarflexion      Ankle inversion      Ankle eversion       (Blank rows = not tested)       FUNCTIONAL TESTS:  30sec STS 3.5 repetitions from standard chair, heavy UE use, knee pain B   GAIT: Distance walked: within clinic Assistive device utilized: None Level of assistance: Complete Independence Comments: antalgic gait R>L, reduced knee ROM throughout all phases  of gait (B but more so on R). Mildly flexed trunk posture. Partial step through pattern leading w/ R. Reduced cadence and gait speed.    01/07/22: HR 70s, SpO2 98% w/ SOB in supine position that resolves with sitting position. VSS throughout remainder of session, no further symptoms.   TODAY'S TREATMENT:  [facility] Adult PT Treatment:                                                DATE: 01/29/22 Therapeutic Exercise: Seated LAQ 2x5 cues for pacing and rest breaks  Seated hip abd RTB 2x8 cues for form and pacing  Seated marches x8, x10 cues for form and pacing Heel raises standing at counter 2x12 Standing hamstring curls x10 cues for form and control HEP review with emphasis on appropriate performance as prescribed  Therapeutic Activity: Significant time spent with education re: activity modification, SPC use as needed, monitoring symptoms, communication w/ provider, introductory PNE as it pertains to exercise participation and activity  [facility] Adult PT Treatment:                                                DATE: 01/21/22 Therapeutic Exercise: Nu step LE/UE during subjective 4inch weight shift + push down 2x5 each LE w/ UE support, cues for isometric push and appropriate lean  Standing hamstring curls no weight 2x10 each LE cues for posture Standing hip kickbacks 45 deg 2x8 each LE cues for form and reduced lean  Standing heel raies x15 B UE support STS 2x8 from lowest mat + airex  cues for BOS and full extension Seated marches 3x8 cues for form and pacing  HEP review   [facility] Adult PT Treatment:                                                DATE: 01/19/22 Therapeutic Exercise: Nu step LE/UE  Standing hamstring curls 2.5# 3x5 each LE cues for form and posture Standing 45deg kickbacks 2.5# 2x5 each LE cues for reduced trunk lean and pacing  Standing heel raises 2.5# each LE, counter support,  3x8 cues for form  STS 2x8 from raised mat, no UE support significant cues for mechanics and BOS, pacing  Adductor iso w/ ball squeeze 3x10 seated, cues for form and pacing, breath control Seated marches 2x10 each LE, cues for form HEP review     PATIENT EDUCATION:  Education details: rationale for interventions, HEP review/education Person educated: Patient Education method: Explanation, Demonstration, Tactile cues, Verbal cues, and Handouts Education comprehension: verbalized understanding, returned demonstration, verbal cues required, tactile cues required, and needs further education     HOME EXERCISE PROGRAM: Access Code: [email or web address] URL: [email or web address] Date: 01/13/2022 Prepared by: Fransisco Hertz  Program Notes use chair for sitting heel slide  Exercises - Sitting Heel Slide with Towel  - 1 x daily - 7 x weekly - 3 sets - 10 reps - Seated Long Arc Quad  - 1 x daily - 7 x weekly - 2 sets - 8 reps - Heel Raises with Counter Support  - 1 x daily - 7 x weekly - 2 sets - 12 reps - Standing Alternating Knee Flexion  - 1 x daily - 7 x weekly - 2 sets - 5 reps   ASSESSMENT:   CLINICAL IMPRESSION: 01/29/2022 Pt arrives with 4-5/10 pain at present, reports increased pain over past few days, possibly attributable  to stomach illness and reduced activity levels. Despite high pain levels on arrival pt reports gradual improvement in R knee pain as session goes on (0/10 on departure), mild irritation in L knee that is comparable to baseline. Reviewed HEP, significant time spent with education as above (monitoring symptoms, appropriate HEP, communication w/ provider as appropriate, and introductory PNE). No adverse events.  Pt departs today's session in no acute distress, all voiced questions/concerns addressed appropriately from PT perspective.     Eval - Patient is a 72 y.o. woman who was seen today for physical therapy evaluation and treatment for L knee pain and gait  issues. Pt reports chronic B knee pain for several years, initially improved after R TKA and subsequent therapy but recently worsening. Pt reports some balance issues but denies falls. Reports variable symptoms but tend to be worst with repetitive weightbearing, most difficulty with sit to stand transfers. Pt reports previously being quite active, enjoys working with horses, but has had to modify activities due to pain. On exam pt demonstrates notable weakness B LE, R more affected than L, limited by weakness/pain. Gait impairments as above. 30sec sit to stand score indicative of fall risk (<10sec for pt's cohort per [facility]). No adverse events. Significant time spent w/ pt education as above, subsequently will defer HEP establishment until next session. Pt departs today's session in no acute distress, all voiced questions/concerns addressed appropriately from PT perspective.     OBJECTIVE IMPAIRMENTS: Abnormal gait, decreased activity tolerance, decreased balance, decreased endurance, decreased mobility, difficulty walking, decreased ROM, decreased strength, improper body mechanics, and pain.    ACTIVITY LIMITATIONS: carrying, lifting, bending, sitting, standing, squatting, sleeping, stairs, transfers, and locomotion level   PARTICIPATION LIMITATIONS: cleaning, laundry, driving, and community activity   PERSONAL FACTORS: Age, Time since onset of injury/illness/exacerbation, and 3+ comorbidities: HTN, DM, RTKA May 2023, CAD/CHF, HTN  are also affecting patient's functional outcome.    REHAB POTENTIAL: Fair given chronicity and comorbidities   CLINICAL DECISION MAKING: Stable/uncomplicated   EVALUATION COMPLEXITY: Low     GOALS: Goals reviewed with patient? No   SHORT TERM GOALS: Target date: 01/27/2022   Pt will demonstrate appropriate understanding and performance of initially prescribed HEP in order to facilitate improved independence with management of symptoms.  Baseline: HEP provided on  eval Goal status: INITIAL    2. Pt will score greater than or equal to 50 on FOTO in order to demonstrate improved perception of function due to symptoms.            Baseline: 47            Goal status: INITIAL    LONG TERM GOALS: Target date: 02/24/2022    Pt will score 53 or greater on FOTO in order to demonstrate improved perception of function due to symptoms.  Baseline: 47 Goal status: INITIAL   2.  Pt will demonstrate 4+/5 knee flexion/extension MMT B in order to facilitate improved strength for transfers/WB. Baseline: see MMT chart above Goal status: INITIAL   3.  Pt will report ability to stand/walk for up to 78min with less than 3/10 pain on NPS in order to improve tolerance for community ambulation and household tasks. Baseline: variable but up to 7/10 pain with above Goal status: INITIAL   4.  Pt will be able to perform at least 7 repetitions for 30sec sit to stand test in order to indicate reduced fall risk and improved independence w/ transfers. (Per CDC cutoff score for fall risk <10  for women in pt age group) Baseline: 3.5 repetitions w/ knee pain Goal status: INITIAL   5. Pt will be able to ascend/descend 2 standard steps without rail safely w/ less than 3/10 pain on NPS in order to indicate improved safety w/ home entry Baseline: pain/difficulty with entering home Goal status: INITIAL      PLAN:   PT FREQUENCY: 1-2x/week   PT DURATION: 8 weeks   PLANNED INTERVENTIONS: Therapeutic exercises, Therapeutic activity, Neuromuscular re-education, Balance training, Gait training, Patient/Family education, Self Care, Joint mobilization, Stair training, Aquatic Therapy, Dry Needling, Electrical stimulation, Cryotherapy, Moist heat, Taping, Manual therapy, and Re-evaluation   PLAN FOR NEXT SESSION:    Progress ROM/strengthening exercises as able/appropriate, review HEP. Plan to incorporate more functional mobility training as tolerated  Ashley Murrain PT, DPT 01/29/2022  12:04 PM

## 2022-01-29 ENCOUNTER — Ambulatory Visit: Payer: Medicare Other | Admitting: Physical Therapy

## 2022-01-29 ENCOUNTER — Encounter: Payer: Self-pay | Admitting: Physical Therapy

## 2022-01-29 DIAGNOSIS — M25562 Pain in left knee: Secondary | ICD-10-CM

## 2022-01-29 DIAGNOSIS — M6281 Muscle weakness (generalized): Secondary | ICD-10-CM

## 2022-01-29 DIAGNOSIS — M25561 Pain in right knee: Secondary | ICD-10-CM | POA: Diagnosis not present

## 2022-01-29 DIAGNOSIS — R2689 Other abnormalities of gait and mobility: Secondary | ICD-10-CM

## 2022-02-01 NOTE — Therapy (Signed)
OUTPATIENT PHYSICAL THERAPY TREATMENT NOTE   Patient Name: [name] MRN: [id number] DOB:Aug 08, 1950, 72 y.o., female Today's Date: 02/02/2022  PCP: Ralene Ok, MD   REFERRING PROVIDER: Pollyann Savoy, MD  END OF SESSION:   PT End of Session - 02/02/22 1148     Visit Number 8    Number of Visits --   1-2x/week   Date for PT Re-Evaluation 02/24/22    Authorization Type [employer] medicare    Progress Note Due on Visit 10    PT Start Time 1148   pt late arrival   PT Stop Time 1229    PT Time Calculation (min) 41 min    Activity Tolerance Patient tolerated treatment well;No increased pain    Behavior During Therapy WFL for tasks assessed/performed                   Past Medical History:  Diagnosis Date   Arthritis    Asthma    CAD (coronary artery disease)    CHF (congestive heart failure) (HCC)    Complication of anesthesia    trouble waking up once   COPD (chronic obstructive pulmonary disease) (HCC)    Diabetes mellitus without complication (HCC)    Dysrhythmia    Heart murmur    Hypertension    Hypothyroidism    Liver disease    NAFLD cirrhosis   Mild aortic stenosis    Pneumonia    Sarcoidosis    Sarcoidosis of lung (HCC)    Sarcoidosis of lymph nodes    Past Surgical History:  Procedure Laterality Date   ABDOMINAL HYSTERECTOMY     ABDOMINAL SURGERY     CHOLECYSTECTOMY     ESOPHAGOGASTRODUODENOSCOPY (EGD) WITH PROPOFOL N/A 04/17/2021   Procedure: ESOPHAGOGASTRODUODENOSCOPY (EGD) WITH PROPOFOL;  Surgeon: Jeani Hawking, MD;  Location: [facility];  Service: Gastroenterology;  Laterality: N/A;   HERNIA REPAIR     LASIK  2001   LEFT HEART CATH AND CORONARY ANGIOGRAPHY N/A 09/23/2020   Procedure: LEFT HEART CATH AND CORONARY ANGIOGRAPHY;  Surgeon: Lennette Bihari, MD;  Location: [facility];  Service: Cardiovascular;  Laterality: N/A;   nissenfundiplication     TOTAL KNEE ARTHROPLASTY Right 05/19/2021   Procedure: TOTAL KNEE ARTHROPLASTY;   Surgeon: Durene Romans, MD;  Location: [facility];  Service: Orthopedics;  Laterality: Right;   Patient Active Problem List   Diagnosis Date Noted   Arthropathy of cervical facet joint 11/20/2021   Diastolic dysfunction 11/20/2021   Other cirrhosis of liver (HCC) 11/20/2021   S/P total knee arthroplasty, right 05/19/2021   Systolic anterior movement of mitral valve 03/30/2021   Chest pain 09/23/2020   Thrombocytopenia (HCC) 09/23/2020   Dehydration 06/22/2012   AKI (acute kidney injury) (HCC) 06/22/2012   Hypothyroidism 06/22/2012   HTN (hypertension) 06/22/2012   Uncontrolled type 2 diabetes mellitus with hyperglycemia (HCC) 06/22/2012   Sarcoidosis 06/22/2012   Heart murmur 06/22/2012   UTI (urinary tract infection) 06/22/2012    REFERRING DIAG: H08.657 (ICD-10-CM) - S/P total knee arthroplasty, right M17.12 (ICD-10-CM) - Primary osteoarthritis of left knee R26.81 (ICD-10-CM) - Unstable gait  THERAPY DIAG:  Right knee pain, unspecified chronicity  Left knee pain, unspecified chronicity  Other abnormalities of gait and mobility  Muscle weakness (generalized)  Rationale for Evaluation and Treatment Rehabilitation  PERTINENT HISTORY: HTN, DM2, R TKA (may 2023), CAD, CHF, HTN   PRECAUTIONS: fall risk  SUBJECTIVE:  SUBJECTIVE STATEMENT:   Pt states her knees are aggravated due to weather but feeling better than before, about 3-4/10 today, mostly in L knee. No other new issues, felt okay after last session.    PAIN:  Are you having pain: 02/02/2022  yes 3-4/10 B knee Location: B knees L>R, anterior and medial How would you describe your pain? burning Best in past week: 0/10 Worst in past week: 7/10 Aggravating factors: transfers, walking/standing (variable, sometimes has difficulty w/ household  distance), difficulty w/ stairs (more so descending) Easing factors: rest, heating pad, walking (sometimes)     OBJECTIVE: (objective measures completed at initial evaluation unless otherwise dated)   DIAGNOSTIC FINDINGS: none recently   PATIENT SURVEYS:  FOTO 47% initial, 53% predicted   COGNITION: Overall cognitive status: Within functional limits for tasks assessed                         SENSATION: Grossly intact aside from numbness along medial/anterior R knee which pt states has been consistent since TKA; denies other sensory symptoms     PALPATION: RLE: TTP distal quad, gradually improving proximally LLE: TTP lateral/medial joint lines No patellar tenderness   LOWER EXTREMITY ROM:   Active ROM Right eval Left eval  Hip flexion      Hip extension      Hip abduction      Hip adduction      Hip internal rotation      Hip external rotation      Knee flexion      Knee extension      Ankle dorsiflexion      Ankle plantarflexion      Ankle inversion      Ankle eversion       (Blank rows = not tested)   LOWER EXTREMITY MMT:   MMT Right eval Left eval  Hip flexion 4- 4  Hip extension      Hip abduction modified sitting 4+ 4+  Hip adduction      Hip internal rotation      Hip external rotation      Knee flexion 3+p! 4-  Knee extension 4 4  Ankle dorsiflexion      Ankle plantarflexion      Ankle inversion      Ankle eversion       (Blank rows = not tested)       FUNCTIONAL TESTS:  30sec STS 3.5 repetitions from standard chair, heavy UE use, knee pain B   GAIT: Distance walked: within clinic Assistive device utilized: None Level of assistance: Complete Independence Comments: antalgic gait R>L, reduced knee ROM throughout all phases of gait (B but more so on R). Mildly flexed trunk posture. Partial step through pattern leading w/ R. Reduced cadence and gait speed.    01/07/22: HR 70s, SpO2 98% w/ SOB in supine position that resolves with sitting  position. VSS throughout remainder of session, no further symptoms.   TODAY'S TREATMENT:  [facility] Adult PT Treatment:                                                DATE: 02/02/22 Therapeutic Exercise: STS 3x5 from chair + 1 airex cues for symmetrical WB and BOS, trunk lean Standing hip kickback 45 deg 2x8 cues for trunk posture and glute contraction Standing marches 2x5 each LE cues for form and comfortable ROM  Seated hip ER and IR (ball + RTB) 2x5 each cues for form  Therapeutic Activity: Discussion re: walking program in home, activity modification/pacing, monitoring symptoms, cane use PRN   [facility] Adult PT Treatment:                                                DATE: 01/29/22 Therapeutic Exercise: Seated LAQ 2x5 cues for pacing and rest breaks  Seated hip abd RTB 2x8 cues for form and pacing  Seated marches x8, x10 cues for form and pacing Heel raises standing at counter 2x12 Standing hamstring curls x10 cues for form and control HEP review with emphasis on appropriate performance as prescribed  Therapeutic Activity: Significant time spent with education re: activity modification, SPC use as needed, monitoring symptoms, communication w/ provider, introductory PNE as it pertains to exercise participation and activity  [facility] Adult PT Treatment:                                                DATE: 01/21/22 Therapeutic Exercise: Nu step LE/UE during subjective 4inch weight shift + push down 2x5 each LE w/ UE support, cues for isometric push and appropriate lean  Standing hamstring curls no weight 2x10 each LE cues for posture Standing hip kickbacks 45 deg 2x8 each LE cues for form and reduced lean  Standing heel raies x15 B UE support STS 2x8 from lowest mat + airex  cues for BOS and full extension Seated marches 3x8 cues for form and pacing  HEP review      PATIENT EDUCATION:   Education details: rationale for interventions, HEP review/education Person educated: Patient Education method: Explanation, Demonstration, Tactile cues, Verbal cues, and Handouts Education comprehension: verbalized understanding, returned demonstration, verbal cues required, tactile cues required, and needs further education     HOME EXERCISE PROGRAM: Access Code: [email or web address] URL: [email or web address] Date: 01/13/2022 Prepared by: Fransisco Hertz  Program Notes use chair for sitting heel slide  Exercises - Sitting Heel Slide with Towel  - 1 x daily - 7 x weekly - 3 sets - 10 reps - Seated Long Arc Quad  - 1 x daily - 7 x weekly - 2 sets - 8 reps - Heel Raises with Counter Support  - 1 x daily - 7 x weekly - 2 sets - 12 reps - Standing Alternating Knee Flexion  - 1 x daily - 7 x weekly - 2 sets - 5 reps   ASSESSMENT:   CLINICAL IMPRESSION: 02/02/2022 Pt arrives w/ 3-4/10 pain in B knees L>R, no issues after last session. Today able to reintroduce more closed chain exercises with good tolerance, reports resolution of knee pain as  session progresses. Cues as above. Education on activity modification, pacing of activity, etc as above. No adverse events, pt tolerates session well. Pt departs today's session in no acute distress, all voiced questions/concerns addressed appropriately from PT perspective.     Eval - Patient is a 72 y.o. woman who was seen today for physical therapy evaluation and treatment for L knee pain and gait issues. Pt reports chronic B knee pain for several years, initially improved after R TKA and subsequent therapy but recently worsening. Pt reports some balance issues but denies falls. Reports variable symptoms but tend to be worst with repetitive weightbearing, most difficulty with sit to stand transfers. Pt reports previously being quite active, enjoys working with horses, but has had to modify activities due to pain. On exam pt demonstrates notable weakness  B LE, R more affected than L, limited by weakness/pain. Gait impairments as above. 30sec sit to stand score indicative of fall risk (<10sec for pt's cohort per [facility]). No adverse events. Significant time spent w/ pt education as above, subsequently will defer HEP establishment until next session. Pt departs today's session in no acute distress, all voiced questions/concerns addressed appropriately from PT perspective.     OBJECTIVE IMPAIRMENTS: Abnormal gait, decreased activity tolerance, decreased balance, decreased endurance, decreased mobility, difficulty walking, decreased ROM, decreased strength, improper body mechanics, and pain.    ACTIVITY LIMITATIONS: carrying, lifting, bending, sitting, standing, squatting, sleeping, stairs, transfers, and locomotion level   PARTICIPATION LIMITATIONS: cleaning, laundry, driving, and community activity   PERSONAL FACTORS: Age, Time since onset of injury/illness/exacerbation, and 3+ comorbidities: HTN, DM, RTKA May 2023, CAD/CHF, HTN  are also affecting patient's functional outcome.    REHAB POTENTIAL: Fair given chronicity and comorbidities   CLINICAL DECISION MAKING: Stable/uncomplicated   EVALUATION COMPLEXITY: Low     GOALS: Goals reviewed with patient? No   SHORT TERM GOALS: Target date: 01/27/2022   Pt will demonstrate appropriate understanding and performance of initially prescribed HEP in order to facilitate improved independence with management of symptoms.  Baseline: HEP provided on eval Goal status: INITIAL    2. Pt will score greater than or equal to 50 on FOTO in order to demonstrate improved perception of function due to symptoms.            Baseline: 47            Goal status: INITIAL    LONG TERM GOALS: Target date: 02/24/2022    Pt will score 53 or greater on FOTO in order to demonstrate improved perception of function due to symptoms.  Baseline: 47 Goal status: INITIAL   2.  Pt will demonstrate 4+/5 knee flexion/extension MMT  B in order to facilitate improved strength for transfers/WB. Baseline: see MMT chart above Goal status: INITIAL   3.  Pt will report ability to stand/walk for up to 23min with less than 3/10 pain on NPS in order to improve tolerance for community ambulation and household tasks. Baseline: variable but up to 7/10 pain with above Goal status: INITIAL   4.  Pt will be able to perform at least 7 repetitions for 30sec sit to stand test in order to indicate reduced fall risk and improved independence w/ transfers. (Per CDC cutoff score for fall risk <10 for women in pt age group) Baseline: 3.5 repetitions w/ knee pain Goal status: INITIAL   5. Pt will be able to ascend/descend 2 standard steps without rail safely w/ less than 3/10 pain on NPS in order to indicate  improved safety w/ home entry Baseline: pain/difficulty with entering home Goal status: INITIAL      PLAN:   PT FREQUENCY: 1-2x/week   PT DURATION: 8 weeks   PLANNED INTERVENTIONS: Therapeutic exercises, Therapeutic activity, Neuromuscular re-education, Balance training, Gait training, Patient/Family education, Self Care, Joint mobilization, Stair training, Aquatic Therapy, Dry Needling, Electrical stimulation, Cryotherapy, Moist heat, Taping, Manual therapy, and Re-evaluation   PLAN FOR NEXT SESSION:   reintroduce step weight shifts, more closed chain activity  Ashley Murrain PT, DPT 02/02/2022 12:35 PM

## 2022-02-02 ENCOUNTER — Ambulatory Visit: Payer: Medicare Other | Admitting: Physical Therapy

## 2022-02-02 ENCOUNTER — Encounter: Payer: Self-pay | Admitting: Physical Therapy

## 2022-02-02 DIAGNOSIS — M25561 Pain in right knee: Secondary | ICD-10-CM

## 2022-02-02 DIAGNOSIS — M25562 Pain in left knee: Secondary | ICD-10-CM

## 2022-02-02 DIAGNOSIS — M6281 Muscle weakness (generalized): Secondary | ICD-10-CM

## 2022-02-02 DIAGNOSIS — R2689 Other abnormalities of gait and mobility: Secondary | ICD-10-CM

## 2022-02-02 NOTE — Therapy (Signed)
OUTPATIENT PHYSICAL THERAPY TREATMENT NOTE   Patient Name: [name] MRN: [id number] DOB:Oct 24, 1950, 72 y.o., female Today's Date: 02/04/2022  PCP: Ralene Ok, MD   REFERRING PROVIDER: Pollyann Savoy, MD  END OF SESSION:   PT End of Session - 02/04/22 1328     Visit Number 9    Number of Visits --   1-2x/week   Date for PT Re-Evaluation 02/24/22    Authorization Type [employer] medicare    Progress Note Due on Visit 10    PT Start Time 1328    PT Stop Time 1410    PT Time Calculation (min) 42 min    Activity Tolerance Patient tolerated treatment well;No increased pain    Behavior During Therapy WFL for tasks assessed/performed                    Past Medical History:  Diagnosis Date   Arthritis    Asthma    CAD (coronary artery disease)    CHF (congestive heart failure) (HCC)    Complication of anesthesia    trouble waking up once   COPD (chronic obstructive pulmonary disease) (HCC)    Diabetes mellitus without complication (HCC)    Dysrhythmia    Heart murmur    Hypertension    Hypothyroidism    Liver disease    NAFLD cirrhosis   Mild aortic stenosis    Pneumonia    Sarcoidosis    Sarcoidosis of lung (HCC)    Sarcoidosis of lymph nodes    Past Surgical History:  Procedure Laterality Date   ABDOMINAL HYSTERECTOMY     ABDOMINAL SURGERY     CHOLECYSTECTOMY     ESOPHAGOGASTRODUODENOSCOPY (EGD) WITH PROPOFOL N/A 04/17/2021   Procedure: ESOPHAGOGASTRODUODENOSCOPY (EGD) WITH PROPOFOL;  Surgeon: Jeani Hawking, MD;  Location: [facility];  Service: Gastroenterology;  Laterality: N/A;   HERNIA REPAIR     LASIK  2001   LEFT HEART CATH AND CORONARY ANGIOGRAPHY N/A 09/23/2020   Procedure: LEFT HEART CATH AND CORONARY ANGIOGRAPHY;  Surgeon: Lennette Bihari, MD;  Location: [facility];  Service: Cardiovascular;  Laterality: N/A;   nissenfundiplication     TOTAL KNEE ARTHROPLASTY Right 05/19/2021   Procedure: TOTAL KNEE ARTHROPLASTY;  Surgeon: Durene Romans, MD;  Location: [facility];  Service: Orthopedics;  Laterality: Right;   Patient Active Problem List   Diagnosis Date Noted   Arthropathy of cervical facet joint 11/20/2021   Diastolic dysfunction 11/20/2021   Other cirrhosis of liver (HCC) 11/20/2021   S/P total knee arthroplasty, right 05/19/2021   Systolic anterior movement of mitral valve 03/30/2021   Chest pain 09/23/2020   Thrombocytopenia (HCC) 09/23/2020   Dehydration 06/22/2012   AKI (acute kidney injury) (HCC) 06/22/2012   Hypothyroidism 06/22/2012   HTN (hypertension) 06/22/2012   Uncontrolled type 2 diabetes mellitus with hyperglycemia (HCC) 06/22/2012   Sarcoidosis 06/22/2012   Heart murmur 06/22/2012   UTI (urinary tract infection) 06/22/2012    REFERRING DIAG: D74.128 (ICD-10-CM) - S/P total knee arthroplasty, right M17.12 (ICD-10-CM) - Primary osteoarthritis of left knee R26.81 (ICD-10-CM) - Unstable gait  THERAPY DIAG:  Right knee pain, unspecified chronicity  Left knee pain, unspecified chronicity  Other abnormalities of gait and mobility  Muscle weakness (generalized)  Rationale for Evaluation and Treatment Rehabilitation  PERTINENT HISTORY: HTN, DM2, R TKA (may 2023), CAD, CHF, HTN   PRECAUTIONS: fall risk  SUBJECTIVE:  SUBJECTIVE STATEMENT:   No right sided pain, 2-3/10 pain in L knee. Denies any issues after last session, no significant soreness. Good adherence w/ HEP. Mostly feeling tightness today.    PAIN:  Are you having pain: 02/04/2022  yes 2-3/10 L knee only  Location: B knees L>R, anterior and medial How would you describe your pain? burning Best in past week: 0/10 Worst in past week: 5/10 (7/10 on eval) Aggravating factors: transfers, walking/standing (variable, sometimes has difficulty w/ household  distance), difficulty w/ stairs (more so descending) Easing factors: rest, heating pad, walking (sometimes)     OBJECTIVE: (objective measures completed at initial evaluation unless otherwise dated)   DIAGNOSTIC FINDINGS: none recently   PATIENT SURVEYS:  FOTO 47% initial, 53% predicted   COGNITION: Overall cognitive status: Within functional limits for tasks assessed                         SENSATION: Grossly intact aside from numbness along medial/anterior R knee which pt states has been consistent since TKA; denies other sensory symptoms     PALPATION: RLE: TTP distal quad, gradually improving proximally LLE: TTP lateral/medial joint lines No patellar tenderness   LOWER EXTREMITY ROM:   Active ROM Right eval Left eval  Hip flexion      Hip extension      Hip abduction      Hip adduction      Hip internal rotation      Hip external rotation      Knee flexion      Knee extension      Ankle dorsiflexion      Ankle plantarflexion      Ankle inversion      Ankle eversion       (Blank rows = not tested)   LOWER EXTREMITY MMT:   MMT Right eval Left eval  Hip flexion 4- 4  Hip extension      Hip abduction modified sitting 4+ 4+  Hip adduction      Hip internal rotation      Hip external rotation      Knee flexion 3+p! 4-  Knee extension 4 4  Ankle dorsiflexion      Ankle plantarflexion      Ankle inversion      Ankle eversion       (Blank rows = not tested)       FUNCTIONAL TESTS:  30sec STS 3.5 repetitions from standard chair, heavy UE use, knee pain B 02/04/22: 30sec STS 6.5 repetitions from standard chair with no UE support, no R knee pain, L knee pain during but no increase after  GAIT: Distance walked: within clinic Assistive device utilized: None Level of assistance: Complete Independence Comments: antalgic gait R>L, reduced knee ROM throughout all phases of gait (B but more so on R). Mildly flexed trunk posture. Partial step through pattern  leading w/ R. Reduced cadence and gait speed.    01/07/22: HR 70s, SpO2 98% w/ SOB in supine position that resolves with sitting position. VSS throughout remainder of session, no further symptoms.   TODAY'S TREATMENT:  [facility] Adult PT Treatment:                                                DATE: 02/04/22 Therapeutic Exercise: 4inch weight shifts 2x8 each LE cues for mechanics and setup STS from chair x7, 2x5 cues for form and pacing  Hip 45 deg kickbacks 2.5# ankle weights 3x5 cues for form and trunk posture  HEP review + education  Gait Training: Discussion/education re: SPC use, appropriate sizing and sequencing Laps in gym with standing rest breaks using SPC, SBA, education/cues for sequencing, posture, step length   [facility] Adult PT Treatment:                                                DATE: 02/02/22 Therapeutic Exercise: STS 3x5 from chair + 1 airex cues for symmetrical WB and BOS, trunk lean Standing hip kickback 45 deg 2x8 cues for trunk posture and glute contraction Standing marches 2x5 each LE cues for form and comfortable ROM  Seated hip ER and IR (ball + RTB) 2x5 each cues for form  Therapeutic Activity: Discussion re: walking program in home, activity modification/pacing, monitoring symptoms, cane use PRN   [facility] Adult PT Treatment:                                                DATE: 01/29/22 Therapeutic Exercise: Seated LAQ 2x5 cues for pacing and rest breaks  Seated hip abd RTB 2x8 cues for form and pacing  Seated marches x8, x10 cues for form and pacing Heel raises standing at counter 2x12 Standing hamstring curls x10 cues for form and control HEP review with emphasis on appropriate performance as prescribed  Therapeutic Activity: Significant time spent with education re: activity modification, SPC use as needed, monitoring symptoms, communication w/ provider, introductory  PNE as it pertains to exercise participation and activity     PATIENT EDUCATION:  Education details: rationale for interventions, HEP review/education Person educated: Patient Education method: Explanation, Demonstration, Tactile cues, Verbal cues, and Handouts Education comprehension: verbalized understanding, returned demonstration, verbal cues required, tactile cues required, and needs further education     HOME EXERCISE PROGRAM: Access Code: [email or web address] URL: [email or web address] Date: 01/13/2022 Prepared by: Fransisco Hertzavid Hermen Mario  Program Notes use chair for sitting heel slide  Exercises - Sitting Heel Slide with Towel  - 1 x daily - 7 x weekly - 3 sets - 10 reps - Seated Long Arc Quad  - 1 x daily - 7 x weekly - 2 sets - 8 reps - Heel Raises with Counter Support  - 1 x daily - 7 x weekly - 2 sets - 12 reps - Standing Alternating Knee Flexion  - 1 x daily - 7 x weekly - 2 sets - 5 reps   ASSESSMENT:   CLINICAL IMPRESSION: 02/04/2022 Pt arrives w/o R knee pain, report of 2-3/10 pain in L knee. Reports feeling better after last session. Pt reports excellent adherence to HEP and increasing activity levels as tolerated, pt reporting improving energy levels. Pt tolerates session, mild L knee discomfort that improves w/ repetition.  30sec STS significantly improved compared to last session as described above. End of session focusing on gait training with SPC as pt reports difficulty using it when symptoms worse. Pt tolerates session well, denies any pain on departure, reports feeling good with progress thus far. No adverse events. Pt departs today's session in no acute distress, all voiced questions/concerns addressed appropriately from PT perspective.    Eval - Patient is a 72 y.o. woman who was seen today for physical therapy evaluation and treatment for L knee pain and gait issues. Pt reports chronic B knee pain for several years, initially improved after R TKA and subsequent therapy  but recently worsening. Pt reports some balance issues but denies falls. Reports variable symptoms but tend to be worst with repetitive weightbearing, most difficulty with sit to stand transfers. Pt reports previously being quite active, enjoys working with horses, but has had to modify activities due to pain. On exam pt demonstrates notable weakness B LE, R more affected than L, limited by weakness/pain. Gait impairments as above. 30sec sit to stand score indicative of fall risk (<10sec for pt's cohort per [facility]). No adverse events. Significant time spent w/ pt education as above, subsequently will defer HEP establishment until next session. Pt departs today's session in no acute distress, all voiced questions/concerns addressed appropriately from PT perspective.     OBJECTIVE IMPAIRMENTS: Abnormal gait, decreased activity tolerance, decreased balance, decreased endurance, decreased mobility, difficulty walking, decreased ROM, decreased strength, improper body mechanics, and pain.    ACTIVITY LIMITATIONS: carrying, lifting, bending, sitting, standing, squatting, sleeping, stairs, transfers, and locomotion level   PARTICIPATION LIMITATIONS: cleaning, laundry, driving, and community activity   PERSONAL FACTORS: Age, Time since onset of injury/illness/exacerbation, and 3+ comorbidities: HTN, DM, RTKA May 2023, CAD/CHF, HTN  are also affecting patient's functional outcome.    REHAB POTENTIAL: Fair given chronicity and comorbidities   CLINICAL DECISION MAKING: Stable/uncomplicated   EVALUATION COMPLEXITY: Low     GOALS: Goals reviewed with patient? No   SHORT TERM GOALS: Target date: 01/27/2022   Pt will demonstrate appropriate understanding and performance of initially prescribed HEP in order to facilitate improved independence with management of symptoms.  Baseline: HEP provided on eval Goal status: INITIAL    2. Pt will score greater than or equal to 50 on FOTO in order to demonstrate improved  perception of function due to symptoms.            Baseline: 47            Goal status: INITIAL    LONG TERM GOALS: Target date: 02/24/2022    Pt will score 53 or greater on FOTO in order to demonstrate improved perception of function due to symptoms.  Baseline: 47 Goal status: INITIAL   2.  Pt will demonstrate 4+/5 knee flexion/extension MMT B in order to facilitate improved strength for transfers/WB. Baseline: see MMT chart above Goal status: INITIAL   3.  Pt will report ability to stand/walk for up to 60min with less than 3/10 pain on NPS in order to improve tolerance for community ambulation and household tasks. Baseline: variable but up to 7/10 pain with above Goal status: INITIAL   4.  Pt will be able to perform at least 7 repetitions for 30sec sit to stand test in order to indicate reduced fall risk and improved independence w/ transfers. (Per CDC cutoff score for fall risk <10 for women in pt age group) Baseline: 3.5 repetitions w/ knee pain Goal status: INITIAL  5. Pt will be able to ascend/descend 2 standard steps without rail safely w/ less than 3/10 pain on NPS in order to indicate improved safety w/ home entry Baseline: pain/difficulty with entering home Goal status: INITIAL      PLAN:   PT FREQUENCY: 1-2x/week   PT DURATION: 8 weeks   PLANNED INTERVENTIONS: Therapeutic exercises, Therapeutic activity, Neuromuscular re-education, Balance training, Gait training, Patient/Family education, Self Care, Joint mobilization, Stair training, Aquatic Therapy, Dry Needling, Electrical stimulation, Cryotherapy, Moist heat, Taping, Manual therapy, and Re-evaluation   PLAN FOR NEXT SESSION:  update HEP, continue to progress closed chain activity as tolerated  Leeroy Cha PT, DPT 02/04/2022 2:13 PM

## 2022-02-04 ENCOUNTER — Encounter: Payer: Self-pay | Admitting: Physical Therapy

## 2022-02-04 ENCOUNTER — Ambulatory Visit: Payer: Medicare Other | Admitting: Physical Therapy

## 2022-02-04 DIAGNOSIS — M25561 Pain in right knee: Secondary | ICD-10-CM

## 2022-02-04 DIAGNOSIS — R2689 Other abnormalities of gait and mobility: Secondary | ICD-10-CM

## 2022-02-04 DIAGNOSIS — M25562 Pain in left knee: Secondary | ICD-10-CM

## 2022-02-04 DIAGNOSIS — M6281 Muscle weakness (generalized): Secondary | ICD-10-CM

## 2022-02-08 NOTE — Therapy (Incomplete)
OUTPATIENT PHYSICAL THERAPY TREATMENT NOTE   Patient Name: [name] MRN: [id number] DOB:1950/07/03, 72 y.o., female Today's Date: 02/08/2022  PCP: Ralene Ok, MD   REFERRING PROVIDER: Pollyann Savoy, MD  END OF SESSION:            Past Medical History:  Diagnosis Date   Arthritis    Asthma    CAD (coronary artery disease)    CHF (congestive heart failure) (HCC)    Complication of anesthesia    trouble waking up once   COPD (chronic obstructive pulmonary disease) (HCC)    Diabetes mellitus without complication (HCC)    Dysrhythmia    Heart murmur    Hypertension    Hypothyroidism    Liver disease    NAFLD cirrhosis   Mild aortic stenosis    Pneumonia    Sarcoidosis    Sarcoidosis of lung (HCC)    Sarcoidosis of lymph nodes    Past Surgical History:  Procedure Laterality Date   ABDOMINAL HYSTERECTOMY     ABDOMINAL SURGERY     CHOLECYSTECTOMY     ESOPHAGOGASTRODUODENOSCOPY (EGD) WITH PROPOFOL N/A 04/17/2021   Procedure: ESOPHAGOGASTRODUODENOSCOPY (EGD) WITH PROPOFOL;  Surgeon: Jeani Hawking, MD;  Location: [facility];  Service: Gastroenterology;  Laterality: N/A;   HERNIA REPAIR     LASIK  2001   LEFT HEART CATH AND CORONARY ANGIOGRAPHY N/A 09/23/2020   Procedure: LEFT HEART CATH AND CORONARY ANGIOGRAPHY;  Surgeon: Lennette Bihari, MD;  Location: [facility];  Service: Cardiovascular;  Laterality: N/A;   nissenfundiplication     TOTAL KNEE ARTHROPLASTY Right 05/19/2021   Procedure: TOTAL KNEE ARTHROPLASTY;  Surgeon: Durene Romans, MD;  Location: [facility];  Service: Orthopedics;  Laterality: Right;   Patient Active Problem List   Diagnosis Date Noted   Arthropathy of cervical facet joint 11/20/2021   Diastolic dysfunction 11/20/2021   Other cirrhosis of liver (HCC) 11/20/2021   S/P total knee arthroplasty, right 05/19/2021   Systolic anterior movement of mitral valve 03/30/2021   Chest pain 09/23/2020   Thrombocytopenia (HCC) 09/23/2020    Dehydration 06/22/2012   AKI (acute kidney injury) (HCC) 06/22/2012   Hypothyroidism 06/22/2012   HTN (hypertension) 06/22/2012   Uncontrolled type 2 diabetes mellitus with hyperglycemia (HCC) 06/22/2012   Sarcoidosis 06/22/2012   Heart murmur 06/22/2012   UTI (urinary tract infection) 06/22/2012    REFERRING DIAG: O70.786 (ICD-10-CM) - S/P total knee arthroplasty, right M17.12 (ICD-10-CM) - Primary osteoarthritis of left knee R26.81 (ICD-10-CM) - Unstable gait  THERAPY DIAG:  No diagnosis found.  Rationale for Evaluation and Treatment Rehabilitation  PERTINENT HISTORY: HTN, DM2, R TKA (may 2023), CAD, CHF, HTN   PRECAUTIONS: fall risk  SUBJECTIVE:  SUBJECTIVE STATEMENT:   ***  *** No right sided pain, 2-3/10 pain in L knee. Denies any issues after last session, no significant soreness. Good adherence w/ HEP. Mostly feeling tightness today.    PAIN:  Are you having pain: 02/08/2022  *** yes 2-3/10 L knee only  Location: B knees L>R, anterior and medial How would you describe your pain? burning Best in past week: 0/10 Worst in past week: 5/10 (7/10 on eval) Aggravating factors: transfers, walking/standing (variable, sometimes has difficulty w/ household distance), difficulty w/ stairs (more so descending) Easing factors: rest, heating pad, walking (sometimes)     OBJECTIVE: (objective measures completed at initial evaluation unless otherwise dated)   DIAGNOSTIC FINDINGS: none recently   PATIENT SURVEYS:  FOTO 47% initial, 53% predicted   COGNITION: Overall cognitive status: Within functional limits for tasks assessed                         SENSATION: Grossly intact aside from numbness along medial/anterior R knee which pt states has been consistent since TKA; denies other sensory  symptoms     PALPATION: RLE: TTP distal quad, gradually improving proximally LLE: TTP lateral/medial joint lines No patellar tenderness   LOWER EXTREMITY ROM:   Active ROM Right eval Left eval  Hip flexion      Hip extension      Hip abduction      Hip adduction      Hip internal rotation      Hip external rotation      Knee flexion      Knee extension      Ankle dorsiflexion      Ankle plantarflexion      Ankle inversion      Ankle eversion       (Blank rows = not tested)   LOWER EXTREMITY MMT:   MMT Right eval Left eval  Hip flexion 4- 4  Hip extension      Hip abduction modified sitting 4+ 4+  Hip adduction      Hip internal rotation      Hip external rotation      Knee flexion 3+p! 4-  Knee extension 4 4  Ankle dorsiflexion      Ankle plantarflexion      Ankle inversion      Ankle eversion       (Blank rows = not tested)       FUNCTIONAL TESTS:  30sec STS 3.5 repetitions from standard chair, heavy UE use, knee pain B 02/04/22: 30sec STS 6.5 repetitions from standard chair with no UE support, no R knee pain, L knee pain during but no increase after  GAIT: Distance walked: within clinic Assistive device utilized: None Level of assistance: Complete Independence Comments: antalgic gait R>L, reduced knee ROM throughout all phases of gait (B but more so on R). Mildly flexed trunk posture. Partial step through pattern leading w/ R. Reduced cadence and gait speed.    01/07/22: HR 70s, SpO2 98% w/ SOB in supine position that resolves with sitting position. VSS throughout remainder of session, no further symptoms.   TODAY'S TREATMENT:  [facility] Treatment:                                                DATE: 02/09/22 Therapeutic Exercise: *** Manual Therapy: *** Neuromuscular re-ed: *** Therapeutic Activity: *** Modalities: *** Self Care: ***   [facility]  Adult PT Treatment:                                                DATE: 02/04/22 Therapeutic Exercise: 4inch weight shifts 2x8 each LE cues for mechanics and setup STS from chair x7, 2x5 cues for form and pacing  Hip 45 deg kickbacks 2.5# ankle weights 3x5 cues for form and trunk posture  HEP review + education  Gait Training: Discussion/education re: SPC use, appropriate sizing and sequencing Laps in gym with standing rest breaks using SPC, SBA, education/cues for sequencing, posture, step length   [facility] Adult PT Treatment:                                                DATE: 02/02/22 Therapeutic Exercise: STS 3x5 from chair + 1 airex cues for symmetrical WB and BOS, trunk lean Standing hip kickback 45 deg 2x8 cues for trunk posture and glute contraction Standing marches 2x5 each LE cues for form and comfortable ROM  Seated hip ER and IR (ball + RTB) 2x5 each cues for form  Therapeutic Activity: Discussion re: walking program in home, activity modification/pacing, monitoring symptoms, cane use PRN     PATIENT EDUCATION:  Education details: rationale for interventions, HEP review/education Person educated: Patient Education method: Explanation, Demonstration, Tactile cues, Verbal cues, and Handouts Education comprehension: verbalized understanding, returned demonstration, verbal cues required, tactile cues required, and needs further education     HOME EXERCISE PROGRAM: Access Code: [email or web address] URL: [email or web address] Date: 01/13/2022 Prepared by: Enis Slipper  Program Notes use chair for sitting heel slide  Exercises - Sitting Heel Slide with Towel  - 1 x daily - 7 x weekly - 3 sets - 10 reps - Seated Long Arc Quad  - 1 x daily - 7 x weekly - 2 sets - 8 reps - Heel Raises with Counter Support  - 1 x daily - 7 x weekly - 2 sets - 12 reps - Standing Alternating Knee Flexion  - 1 x daily - 7 x weekly - 2 sets - 5 reps   ASSESSMENT:   CLINICAL  IMPRESSION: 02/08/2022 ***  *** Pt arrives w/o R knee pain, report of 2-3/10 pain in L knee. Reports feeling better after last session. Pt reports excellent adherence to HEP and increasing activity levels as tolerated, pt reporting improving energy levels. Pt tolerates session, mild L knee discomfort that improves w/ repetition. 30sec STS significantly improved compared to last session as described above. End of session focusing on gait training with SPC as pt reports difficulty using it when symptoms worse. Pt tolerates session well, denies any pain on departure, reports feeling good with progress thus far. No adverse events. Pt departs today's session in no acute distress, all voiced questions/concerns addressed appropriately from PT perspective.  Eval - Patient is a 72 y.o. woman who was seen today for physical therapy evaluation and treatment for L knee pain and gait issues. Pt reports chronic B knee pain for several years, initially improved after R TKA and subsequent therapy but recently worsening. Pt reports some balance issues but denies falls. Reports variable symptoms but tend to be worst with repetitive weightbearing, most difficulty with sit to stand transfers. Pt reports previously being quite active, enjoys working with horses, but has had to modify activities due to pain. On exam pt demonstrates notable weakness B LE, R more affected than L, limited by weakness/pain. Gait impairments as above. 30sec sit to stand score indicative of fall risk (<10sec for pt's cohort per [facility]). No adverse events. Significant time spent w/ pt education as above, subsequently will defer HEP establishment until next session. Pt departs today's session in no acute distress, all voiced questions/concerns addressed appropriately from PT perspective.     OBJECTIVE IMPAIRMENTS: Abnormal gait, decreased activity tolerance, decreased balance, decreased endurance, decreased mobility, difficulty walking, decreased ROM,  decreased strength, improper body mechanics, and pain.    ACTIVITY LIMITATIONS: carrying, lifting, bending, sitting, standing, squatting, sleeping, stairs, transfers, and locomotion level   PARTICIPATION LIMITATIONS: cleaning, laundry, driving, and community activity   PERSONAL FACTORS: Age, Time since onset of injury/illness/exacerbation, and 3+ comorbidities: HTN, DM, RTKA May 2023, CAD/CHF, HTN  are also affecting patient's functional outcome.    REHAB POTENTIAL: Fair given chronicity and comorbidities   CLINICAL DECISION MAKING: Stable/uncomplicated   EVALUATION COMPLEXITY: Low     GOALS: Goals reviewed with patient? No   SHORT TERM GOALS: Target date: 01/27/2022   Pt will demonstrate appropriate understanding and performance of initially prescribed HEP in order to facilitate improved independence with management of symptoms.  Baseline: HEP provided on eval Goal status: INITIAL    2. Pt will score greater than or equal to 50 on FOTO in order to demonstrate improved perception of function due to symptoms.            Baseline: 47            Goal status: INITIAL    LONG TERM GOALS: Target date: 02/24/2022    Pt will score 53 or greater on FOTO in order to demonstrate improved perception of function due to symptoms.  Baseline: 47 Goal status: INITIAL   2.  Pt will demonstrate 4+/5 knee flexion/extension MMT B in order to facilitate improved strength for transfers/WB. Baseline: see MMT chart above Goal status: INITIAL   3.  Pt will report ability to stand/walk for up to 47min with less than 3/10 pain on NPS in order to improve tolerance for community ambulation and household tasks. Baseline: variable but up to 7/10 pain with above Goal status: INITIAL   4.  Pt will be able to perform at least 7 repetitions for 30sec sit to stand test in order to indicate reduced fall risk and improved independence w/ transfers. (Per CDC cutoff score for fall risk <10 for women in pt age  group) Baseline: 3.5 repetitions w/ knee pain Goal status: INITIAL   5. Pt will be able to ascend/descend 2 standard steps without rail safely w/ less than 3/10 pain on NPS in order to indicate improved safety w/ home entry Baseline: pain/difficulty with entering home Goal status: INITIAL      PLAN:   PT FREQUENCY: 1-2x/week   PT DURATION: 8 weeks   PLANNED INTERVENTIONS: Therapeutic exercises, Therapeutic activity, Neuromuscular re-education, Balance training,  Gait training, Patient/Family education, Self Care, Joint mobilization, Stair training, Aquatic Therapy, Dry Needling, Electrical stimulation, Cryotherapy, Moist heat, Taping, Manual therapy, and Re-evaluation   PLAN FOR NEXT SESSION: *** update HEP, continue to progress closed chain activity as tolerated  Ashley Murrain PT, DPT 02/08/2022 4:17 PM

## 2022-02-09 ENCOUNTER — Ambulatory Visit: Payer: Medicare Other | Admitting: Physical Therapy

## 2022-02-09 ENCOUNTER — Encounter: Payer: Self-pay | Admitting: Physical Therapy

## 2022-02-09 DIAGNOSIS — M25562 Pain in left knee: Secondary | ICD-10-CM

## 2022-02-09 DIAGNOSIS — R2689 Other abnormalities of gait and mobility: Secondary | ICD-10-CM

## 2022-02-09 DIAGNOSIS — M25561 Pain in right knee: Secondary | ICD-10-CM | POA: Diagnosis not present

## 2022-02-09 DIAGNOSIS — M6281 Muscle weakness (generalized): Secondary | ICD-10-CM

## 2022-02-09 NOTE — Therapy (Signed)
OUTPATIENT PHYSICAL THERAPY PROGRESS NOTE + RECERTIFICATION   Patient Name: [name] MRN: [id number] DOB:[birth year], 72 y.o., female Today's Date: 02/09/2022  Progress Note Reporting Period 12/30/21 to 02/09/22  See note below for Objective Data and Assessment of Progress/Goals.    PCP: Ralene Ok, MD   REFERRING PROVIDER: Pollyann Savoy, MD  END OF SESSION:   PT End of Session - 02/09/22 1146     Visit Number 10    Number of Visits 19    Date for PT Re-Evaluation 03/12/22    Authorization Type [employer] medicare    Progress Note Due on Visit 20    PT Start Time 1146    PT Stop Time 1231    PT Time Calculation (min) 45 min    Activity Tolerance Patient tolerated treatment well;No increased pain    Behavior During Therapy WFL for tasks assessed/performed              Past Medical History:  Diagnosis Date   Arthritis    Asthma    CAD (coronary artery disease)    CHF (congestive heart failure) (HCC)    Complication of anesthesia    trouble waking up once   COPD (chronic obstructive pulmonary disease) (HCC)    Diabetes mellitus without complication (HCC)    Dysrhythmia    Heart murmur    Hypertension    Hypothyroidism    Liver disease    NAFLD cirrhosis   Mild aortic stenosis    Pneumonia    Sarcoidosis    Sarcoidosis of lung (HCC)    Sarcoidosis of lymph nodes    Past Surgical History:  Procedure Laterality Date   ABDOMINAL HYSTERECTOMY     ABDOMINAL SURGERY     CHOLECYSTECTOMY     ESOPHAGOGASTRODUODENOSCOPY (EGD) WITH PROPOFOL N/A 04/17/2021   Procedure: ESOPHAGOGASTRODUODENOSCOPY (EGD) WITH PROPOFOL;  Surgeon: Jeani Hawking, MD;  Location: [facility];  Service: Gastroenterology;  Laterality: N/A;   HERNIA REPAIR     LASIK  2001   LEFT HEART CATH AND CORONARY ANGIOGRAPHY N/A 09/23/2020   Procedure: LEFT HEART CATH AND CORONARY ANGIOGRAPHY;  Surgeon: Lennette Bihari, MD;  Location: [facility];  Service: Cardiovascular;  Laterality: N/A;    nissenfundiplication     TOTAL KNEE ARTHROPLASTY Right 05/19/2021   Procedure: TOTAL KNEE ARTHROPLASTY;  Surgeon: Durene Romans, MD;  Location: [facility];  Service: Orthopedics;  Laterality: Right;   Patient Active Problem List   Diagnosis Date Noted   Arthropathy of cervical facet joint 11/20/2021   Diastolic dysfunction 11/20/2021   Other cirrhosis of liver (HCC) 11/20/2021   S/P total knee arthroplasty, right 05/19/2021   Systolic anterior movement of mitral valve 03/30/2021   Chest pain 09/23/2020   Thrombocytopenia (HCC) 09/23/2020   Dehydration 06/22/2012   AKI (acute kidney injury) (HCC) 06/22/2012   Hypothyroidism 06/22/2012   HTN (hypertension) 06/22/2012   Uncontrolled type 2 diabetes mellitus with hyperglycemia (HCC) 06/22/2012   Sarcoidosis 06/22/2012   Heart murmur 06/22/2012   UTI (urinary tract infection) 06/22/2012    REFERRING DIAG: Y30.160 (ICD-10-CM) - S/P total knee arthroplasty, right M17.12 (ICD-10-CM) - Primary osteoarthritis of left knee R26.81 (ICD-10-CM) - Unstable gait  THERAPY DIAG:  Right knee pain, unspecified chronicity  Left knee pain, unspecified chronicity  Other abnormalities of gait and mobility  Muscle weakness (generalized)  Rationale for Evaluation and Treatment Rehabilitation  PERTINENT HISTORY: HTN, DM2, R TKA (may 2023), CAD, CHF, HTN   PRECAUTIONS: fall risk  SUBJECTIVE:  SUBJECTIVE STATEMENT:   2-3/10 pain in L knee at present. Mild soreness after last session, no issues reported. Notes overall symptoms fluctuate still but less intense, mobility improving overall, stairs remain difficult.     PAIN:  Are you having pain: 02/09/2022   yes 2-3/10 B knees Location: B knees L>R, anterior and medial How would you describe your pain? burning Best in  past week: 0/10 Worst in past week: 4/10 (7/10 on eval) Aggravating factors: transfers, walking/standing (variable, sometimes has difficulty w/ household distance), difficulty w/ stairs (more so descending) Easing factors: rest, heating pad, walking (sometimes)     OBJECTIVE: (objective measures completed at initial evaluation unless otherwise dated)   DIAGNOSTIC FINDINGS: none recently   PATIENT SURVEYS:  FOTO 47% initial, 53% predicted FOTO 02/09/22: 55%    COGNITION: Overall cognitive status: Within functional limits for tasks assessed                         SENSATION: Grossly intact aside from numbness along medial/anterior R knee which pt states has been consistent since TKA; denies other sensory symptoms     PALPATION: RLE: TTP distal quad, gradually improving proximally LLE: TTP lateral/medial joint lines No patellar tenderness   LOWER EXTREMITY ROM:   Active ROM Right eval Left eval  Hip flexion      Hip extension      Hip abduction      Hip adduction      Hip internal rotation      Hip external rotation      Knee flexion      Knee extension      Ankle dorsiflexion      Ankle plantarflexion      Ankle inversion      Ankle eversion       (Blank rows = not tested)   LOWER EXTREMITY MMT:   MMT Right eval Left eval R/L 02/09/22  Hip flexion 4- 4 4+/4+  Hip extension       Hip abduction modified sitting 4+ 4+ 5/5  Hip adduction       Hip internal rotation       Hip external rotation       Knee flexion 3+p! 4- 4+/4+ painless BIL   Knee extension 4 4 4p! /4+  Ankle dorsiflexion       Ankle plantarflexion       Ankle inversion       Ankle eversion        (Blank rows = not tested)       FUNCTIONAL TESTS:  30sec STS 3.5 repetitions from standard chair, heavy UE use, knee pain B 02/04/22: 30sec STS 6.5 repetitions from standard chair with no UE support, no R knee pain, L knee pain during but no increase after   GAIT: Distance walked: within  clinic Assistive device utilized: None Level of assistance: Complete Independence Comments: antalgic gait R>L, reduced knee ROM throughout all phases of gait (B but more so on R). Mildly flexed trunk posture. Partial step through pattern leading w/ R. Reduced cadence and gait speed.    01/07/22: HR 70s, SpO2 98% w/ SOB in supine position that resolves with sitting position. VSS throughout remainder of session, no further symptoms.   TODAY'S TREATMENT:  [facility]:                                                DATE: 02/09/22 Therapeutic Exercise: 2inch step up x5 each LE cues for control and form 2inch step up + RTB TKE x5 cues for weight shift and form  Knee curls standing 2.5# x8 each LE cues for hip/knee positioning  Hip 45 kickbacks 2.5# 2x5 each LE cues for form Heel raises 2.5# 2x10 cues for form and pacing  Heel slide w/ strap x12 each LE   Therapeutic Activity: FOTO + education MSK assessment + education   [facility] Adult PT Treatment:                                                DATE: 02/04/22 Therapeutic Exercise: 4inch weight shifts 2x8 each LE cues for mechanics and setup STS from chair x7, 2x5 cues for form and pacing  Hip 45 deg kickbacks 2.5# ankle weights 3x5 cues for form and trunk posture  HEP review + education  Gait Training: Discussion/education re: SPC use, appropriate sizing and sequencing Laps in gym with standing rest breaks using SPC, SBA, education/cues for sequencing, posture, step length  [facility] Adult PT Treatment:                                                DATE: 02/02/22 Therapeutic Exercise: STS 3x5 from chair + 1 airex cues for symmetrical WB and BOS, trunk lean Standing hip kickback 45 deg 2x8 cues for trunk posture and glute contraction Standing marches 2x5 each LE cues for form and comfortable ROM  Seated hip ER and IR (ball + RTB) 2x5 each  cues for form  Therapeutic Activity: Discussion re: walking program in home, activity modification/pacing, monitoring symptoms, cane use PRN     PATIENT EDUCATION:  Education details: rationale for interventions, HEP review/education Person educated: Patient Education method: Explanation, Demonstration, Tactile cues, Verbal cues, and Handouts Education comprehension: verbalized understanding, returned demonstration, verbal cues required, tactile cues required, and needs further education     HOME EXERCISE PROGRAM: Access Code: [email or web address] URL: [email or web address] Date: 01/13/2022 Prepared by: Enis Slipper  Program Notes use chair for sitting heel slide  Exercises - Sitting Heel Slide with Towel  - 1 x daily - 7 x weekly - 3 sets - 10 reps - Seated Long Arc Quad  - 1 x daily - 7 x weekly - 2 sets - 8 reps - Heel Raises with Counter Support  - 1 x daily - 7 x weekly - 2 sets - 12 reps - Standing Alternating Knee Flexion  - 1 x daily - 7 x weekly - 2 sets - 5 reps   ASSESSMENT:   CLINICAL IMPRESSION: 02/09/2022 Pt arrives w/ 2-3/10 knee pain for today's progress note, overall notes significant improvement in mobility and intensity of pain since starting therapy. This is supported by improvement in FOTO score of 55% today, which is improved compared to initial evaluation and has met predicted score. STG/LTGs addressed as below, pt progressing well. Continues to report most  difficulty with stairs. Given progress thus far and anticipated potential for continued improvement, recommend extension of POC for additional 4 weeks in order to facilitate improved tolerance to closed chain activity. Pt tolerates exercise well on this date with improved symptoms on departure, no adverse events. Time spent discussing assessment and POC, pt agreeable to plan. Pt departs today's session in no acute distress, all voiced questions/concerns addressed appropriately from PT perspective.    Eval  - Patient is a 72 y.o. woman who was seen today for physical therapy evaluation and treatment for L knee pain and gait issues. Pt reports chronic B knee pain for several years, initially improved after R TKA and subsequent therapy but recently worsening. Pt reports some balance issues but denies falls. Reports variable symptoms but tend to be worst with repetitive weightbearing, most difficulty with sit to stand transfers. Pt reports previously being quite active, enjoys working with horses, but has had to modify activities due to pain. On exam pt demonstrates notable weakness B LE, R more affected than L, limited by weakness/pain. Gait impairments as above. 30sec sit to stand score indicative of fall risk (<10sec for pt's cohort per [facility]). No adverse events. Significant time spent w/ pt education as above, subsequently will defer HEP establishment until next session. Pt departs today's session in no acute distress, all voiced questions/concerns addressed appropriately from PT perspective.     OBJECTIVE IMPAIRMENTS: Abnormal gait, decreased activity tolerance, decreased balance, decreased endurance, decreased mobility, difficulty walking, decreased ROM, decreased strength, improper body mechanics, and pain.    ACTIVITY LIMITATIONS: carrying, lifting, bending, sitting, standing, squatting, sleeping, stairs, transfers, and locomotion level   PARTICIPATION LIMITATIONS: cleaning, laundry, driving, and community activity   PERSONAL FACTORS: Age, Time since onset of injury/illness/exacerbation, and 3+ comorbidities: HTN, DM, RTKA May 2023, CAD/CHF, HTN  are also affecting patient's functional outcome.    REHAB POTENTIAL: Fair given chronicity and comorbidities   CLINICAL DECISION MAKING: Stable/uncomplicated   EVALUATION COMPLEXITY: Low     GOALS: Goals reviewed with patient? No   SHORT TERM GOALS: Target date: 01/27/2022   Pt will demonstrate appropriate understanding and performance of initially  prescribed HEP in order to facilitate improved independence with management of symptoms.  Baseline: HEP provided on eval  Goal status: MET   2. Pt will score greater than or equal to 50 on FOTO in order to demonstrate improved perception of function due to symptoms.            Baseline: 47 02/09/22: 55%            Goal status: MET   LONG TERM GOALS: Target date: 03/12/22 (updated 02/09/22)    Pt will score 53 or greater on FOTO in order to demonstrate improved perception of function due to symptoms.  Baseline: 47 02/09/22: 55% Goal status: MET   2.  Pt will demonstrate 4+/5 knee flexion/extension MMT B in order to facilitate improved strength for transfers/WB. Baseline: see MMT chart above Goal status: MET   3.  Pt will report ability to stand/walk for up to 14min with less than 3/10 pain on NPS in order to improve tolerance for community ambulation and household tasks. Baseline: variable but up to 7/10 pain with above 02/09/22: remains variable, but generally 1-2 pt increase with ~90min Goal status: PARTIALLY MET   4.  Pt will be able to perform at least 7 repetitions for 30sec sit to stand test in order to indicate reduced fall risk and improved independence w/ transfers. (  Per CDC cutoff score for fall risk <10 for women in pt age group) Baseline: 3.5 repetitions w/ knee pain Goal status: INITIAL   5. Pt will be able to ascend/descend 2 standard steps without rail safely w/ less than 3/10 pain on NPS in order to indicate improved safety w/ home entry Baseline: pain/difficulty with entering home 02/09/22: up to 4/10 pain with stairs Goal status: ONGOING    PLAN (updated 02/09/22):   PT FREQUENCY: 2x/week   PT DURATION: 4 weeks   PLANNED INTERVENTIONS: Therapeutic exercises, Therapeutic activity, Neuromuscular re-education, Balance training, Gait training, Patient/Family education, Self Care, Joint mobilization, Stair training, Aquatic Therapy, Dry Needling, Electrical stimulation,  Cryotherapy, Moist heat, Taping, Manual therapy, and Re-evaluation   PLAN FOR NEXT SESSION:  update HEP, continue to progress closed chain activity as tolerated  Ashley Murrain PT, DPT 02/09/2022 1:08 PM

## 2022-02-10 NOTE — Therapy (Signed)
OUTPATIENT PHYSICAL THERAPY TREATMENT NOTE   Patient Name: [name] MRN: [id number] DOB:Dec 03, 1950, 72 y.o., female Today's Date: 02/11/2022   PCP: Jilda Panda, MD   REFERRING PROVIDER: Bo Merino, MD  END OF SESSION:   PT End of Session - 02/11/22 1055     Visit Number 11    Number of Visits 19    Date for PT Re-Evaluation 03/12/22    Authorization Type [employer] medicare    Progress Note Due on Visit 20    PT Start Time 1057    PT Stop Time 1142    PT Time Calculation (min) 45 min    Activity Tolerance Patient tolerated treatment well;No increased pain    Behavior During Therapy WFL for tasks assessed/performed               Past Medical History:  Diagnosis Date   Arthritis    Asthma    CAD (coronary artery disease)    CHF (congestive heart failure) (HCC)    Complication of anesthesia    trouble waking up once   COPD (chronic obstructive pulmonary disease) ([place])    Diabetes mellitus without complication ([place])    Dysrhythmia    Heart murmur    Hypertension    Hypothyroidism    Liver disease    NAFLD cirrhosis   Mild aortic stenosis    Pneumonia    Sarcoidosis    Sarcoidosis of lung ([place])    Sarcoidosis of lymph nodes    Past Surgical History:  Procedure Laterality Date   ABDOMINAL HYSTERECTOMY     ABDOMINAL SURGERY     CHOLECYSTECTOMY     ESOPHAGOGASTRODUODENOSCOPY (EGD) WITH PROPOFOL N/A 04/17/2021   Procedure: ESOPHAGOGASTRODUODENOSCOPY (EGD) WITH PROPOFOL;  Surgeon: Carol Ada, MD;  Location: [facility];  Service: Gastroenterology;  Laterality: N/A;   HERNIA REPAIR     LASIK  2001   LEFT HEART CATH AND CORONARY ANGIOGRAPHY N/A 09/23/2020   Procedure: LEFT HEART CATH AND CORONARY ANGIOGRAPHY;  Surgeon: Troy Sine, MD;  Location: [facility];  Service: Cardiovascular;  Laterality: N/A;   nissenfundiplication     TOTAL KNEE ARTHROPLASTY Right 05/19/2021   Procedure: TOTAL KNEE ARTHROPLASTY;  Surgeon: Paralee Cancel, MD;   Location: [facility];  Service: Orthopedics;  Laterality: Right;   Patient Active Problem List   Diagnosis Date Noted   Arthropathy of cervical facet joint 23/53/6144   Diastolic dysfunction 31/54/0086   Other cirrhosis of liver ([place]) 11/20/2021   S/P total knee arthroplasty, right 76/19/5093   Systolic anterior movement of mitral valve 03/30/2021   Chest pain 09/23/2020   Thrombocytopenia ([place]) 09/23/2020   Dehydration 06/22/2012   AKI (acute kidney injury) ([place]) 06/22/2012   Hypothyroidism 06/22/2012   HTN (hypertension) 06/22/2012   Uncontrolled type 2 diabetes mellitus with hyperglycemia ([place]) 06/22/2012   Sarcoidosis 06/22/2012   Heart murmur 06/22/2012   UTI (urinary tract infection) 06/22/2012    REFERRING DIAG: O67.124 (ICD-10-CM) - S/P total knee arthroplasty, right M17.12 (ICD-10-CM) - Primary osteoarthritis of left knee R26.81 (ICD-10-CM) - Unstable gait  THERAPY DIAG:  Right knee pain, unspecified chronicity  Left knee pain, unspecified chronicity  Other abnormalities of gait and mobility  Muscle weakness (generalized)  Rationale for Evaluation and Treatment Rehabilitation  PERTINENT HISTORY: HTN, DM2, R TKA (may 2023), CAD, CHF, HTN   PRECAUTIONS: fall risk  SUBJECTIVE:  SUBJECTIVE STATEMENT:   Pt denies any soreness after last session, states she felt pretty good. 2-3/10 pain in L knee, no pain in R knee. Good HEP adherence reported, no issues.    PAIN:  Are you having pain: 02/11/2022   yes 2-3/10 L knee only Location: B knees L>R, anterior and medial How would you describe your pain? burning Best in past week: 0/10 Worst in past week: 4/10 (7/10 on eval) Aggravating factors: transfers, walking/standing (variable, sometimes has difficulty w/ household distance), difficulty w/  stairs (more so descending) Easing factors: rest, heating pad, walking (sometimes)     OBJECTIVE: (objective measures completed at initial evaluation unless otherwise dated)   DIAGNOSTIC FINDINGS: none recently   PATIENT SURVEYS:  FOTO 47% initial, 53% predicted FOTO 02/09/22: 55%    COGNITION: Overall cognitive status: Within functional limits for tasks assessed                         SENSATION: Grossly intact aside from numbness along medial/anterior R knee which pt states has been consistent since TKA; denies other sensory symptoms     PALPATION: RLE: TTP distal quad, gradually improving proximally LLE: TTP lateral/medial joint lines No patellar tenderness   LOWER EXTREMITY ROM:   Active ROM Right eval Left eval  Hip flexion      Hip extension      Hip abduction      Hip adduction      Hip internal rotation      Hip external rotation      Knee flexion      Knee extension      Ankle dorsiflexion      Ankle plantarflexion      Ankle inversion      Ankle eversion       (Blank rows = not tested)   LOWER EXTREMITY MMT:   MMT Right eval Left eval R/L 02/09/22  Hip flexion 4- 4 4+/4+  Hip extension       Hip abduction modified sitting 4+ 4+ 5/5  Hip adduction       Hip internal rotation       Hip external rotation       Knee flexion 3+p! 4- 4+/4+ painless BIL   Knee extension 4 4 4p! /4+  Ankle dorsiflexion       Ankle plantarflexion       Ankle inversion       Ankle eversion        (Blank rows = not tested)       FUNCTIONAL TESTS:  30sec STS 3.5 repetitions from standard chair, heavy UE use, knee pain B 02/04/22: 30sec STS 6.5 repetitions from standard chair with no UE support, no R knee pain, L knee pain during but no increase after   GAIT: Distance walked: within clinic Assistive device utilized: None Level of assistance: Complete Independence Comments: antalgic gait R>L, reduced knee ROM throughout all phases of gait (B but more so on R). Mildly  flexed trunk posture. Partial step through pattern leading w/ R. Reduced cadence and gait speed.    01/07/22: HR 70s, SpO2 98% w/ SOB in supine position that resolves with sitting position. VSS throughout remainder of session, no further symptoms.   TODAY'S TREATMENT:  [facility] Adult PT Treatment:                                                DATE: 02/11/22 Therapeutic Exercise: 2inch step up + RTB TKE 2x5 each LE cues for quad squeeze and mechanics  Standing knee curls 4# ankle weight 2x5 each LE cues for knee/hip positioning  Hip 45 deg kickback 4# 2x5 cues for posture and positioning Lateral stepping at counter 3 laps cues for posture and foot positioning  Sit to stand 3x5 standard chair cues for form and mechanics  Heel slide x12 each LE w/ strap and towel HEP update + handout   [facility] Adult PT Treatment:                                                DATE: 02/09/22 Therapeutic Exercise: 2inch step up x5 each LE cues for control and form 2inch step up + RTB TKE x5 cues for weight shift and form  Knee curls standing 2.5# x8 each LE cues for hip/knee positioning  Hip 45 kickbacks 2.5# 2x5 each LE cues for form Heel raises 2.5# 2x10 cues for form and pacing  Heel slide w/ strap x12 each LE   Therapeutic Activity: FOTO + education MSK assessment + education   [facility] Adult PT Treatment:                                                DATE: 02/04/22 Therapeutic Exercise: 4inch weight shifts 2x8 each LE cues for mechanics and setup STS from chair x7, 2x5 cues for form and pacing  Hip 45 deg kickbacks 2.5# ankle weights 3x5 cues for form and trunk posture  HEP review + education  Gait Training: Discussion/education re: SPC use, appropriate sizing and sequencing Laps in gym with standing rest breaks using SPC, SBA, education/cues for sequencing, posture, step length    PATIENT EDUCATION:   Education details: rationale for interventions, HEP update Person educated: Patient Education method: Explanation, Demonstration, Tactile cues, Verbal cues, and Handouts Education comprehension: verbalized understanding, returned demonstration, verbal cues required, tactile cues required, and needs further education     HOME EXERCISE PROGRAM: Access Code: [email or web address] URL: [email or web address] Date: 02/11/2022 Prepared by: Fransisco Hertz  Exercises - Heel Raises with Counter Support  - 1 x daily - 7 x weekly - 2 sets - 12 reps - Standing Alternating Knee Flexion  - 1 x daily - 7 x weekly - 2 sets - 8 reps - Standing Hip Extension with Counter Support  - 1 x daily - 7 x weekly - 2 sets - 8 reps - Sit to Stand with Armchair  - 1 x daily - 7 x weekly - 3 sets - 5 reps   ASSESSMENT:   CLINICAL IMPRESSION: 02/11/2022 Pt arrives w/o R knee pain, w/ 2-3/10 L knee pain, no issues or soreness after last session. Today focusing on progression of familiar program for increased resistance/volume, with familiar exercises reduced cueing to promote increased self efficacy. Introduction of frontal plane exercise with good response, primary report of muscular fatigue. HEP updated as  above to reflect recent progressions. No adverse events, pt denies any pain on departure. Pt departs today's session in no acute distress, all voiced questions/concerns addressed appropriately from PT perspective.      Eval - Patient is a 72 y.o. woman who was seen today for physical therapy evaluation and treatment for L knee pain and gait issues. Pt reports chronic B knee pain for several years, initially improved after R TKA and subsequent therapy but recently worsening. Pt reports some balance issues but denies falls. Reports variable symptoms but tend to be worst with repetitive weightbearing, most difficulty with sit to stand transfers. Pt reports previously being quite active, enjoys working with horses, but has  had to modify activities due to pain. On exam pt demonstrates notable weakness B LE, R more affected than L, limited by weakness/pain. Gait impairments as above. 30sec sit to stand score indicative of fall risk (<10sec for pt's cohort per [facility]). No adverse events. Significant time spent w/ pt education as above, subsequently will defer HEP establishment until next session. Pt departs today's session in no acute distress, all voiced questions/concerns addressed appropriately from PT perspective.     OBJECTIVE IMPAIRMENTS: Abnormal gait, decreased activity tolerance, decreased balance, decreased endurance, decreased mobility, difficulty walking, decreased ROM, decreased strength, improper body mechanics, and pain.    ACTIVITY LIMITATIONS: carrying, lifting, bending, sitting, standing, squatting, sleeping, stairs, transfers, and locomotion level   PARTICIPATION LIMITATIONS: cleaning, laundry, driving, and community activity   PERSONAL FACTORS: Age, Time since onset of injury/illness/exacerbation, and 3+ comorbidities: HTN, DM, RTKA May 2023, CAD/CHF, HTN  are also affecting patient's functional outcome.    REHAB POTENTIAL: Fair given chronicity and comorbidities   CLINICAL DECISION MAKING: Stable/uncomplicated   EVALUATION COMPLEXITY: Low     GOALS: Goals reviewed with patient? No   SHORT TERM GOALS: Target date: 01/27/2022   Pt will demonstrate appropriate understanding and performance of initially prescribed HEP in order to facilitate improved independence with management of symptoms.  Baseline: HEP provided on eval  Goal status: MET   2. Pt will score greater than or equal to 50 on FOTO in order to demonstrate improved perception of function due to symptoms.            Baseline: 47 02/09/22: 55%            Goal status: MET   LONG TERM GOALS: Target date: 03/12/22 (updated 02/09/22)    Pt will score 53 or greater on FOTO in order to demonstrate improved perception of function due to  symptoms.  Baseline: 47 02/09/22: 55% Goal status: MET   2.  Pt will demonstrate 4+/5 knee flexion/extension MMT B in order to facilitate improved strength for transfers/WB. Baseline: see MMT chart above Goal status: MET   3.  Pt will report ability to stand/walk for up to 34min with less than 3/10 pain on NPS in order to improve tolerance for community ambulation and household tasks. Baseline: variable but up to 7/10 pain with above 02/09/22: remains variable, but generally 1-2 pt increase with ~7min Goal status: PARTIALLY MET   4.  Pt will be able to perform at least 7 repetitions for 30sec sit to stand test in order to indicate reduced fall risk and improved independence w/ transfers. (Per CDC cutoff score for fall risk <10 for women in pt age group) Baseline: 3.5 repetitions w/ knee pain Goal status: INITIAL   5. Pt will be able to ascend/descend 2 standard steps without rail safely w/ less  than 3/10 pain on NPS in order to indicate improved safety w/ home entry Baseline: pain/difficulty with entering home 02/09/22: up to 4/10 pain with stairs Goal status: ONGOING    PLAN (updated 02/09/22):   PT FREQUENCY: 2x/week   PT DURATION: 4 weeks   PLANNED INTERVENTIONS: Therapeutic exercises, Therapeutic activity, Neuromuscular re-education, Balance training, Gait training, Patient/Family education, Self Care, Joint mobilization, Stair training, Aquatic Therapy, Dry Needling, Electrical stimulation, Cryotherapy, Moist heat, Taping, Manual therapy, and Re-evaluation   PLAN FOR NEXT SESSION: review HEP, continue to progress closed chain activity as tolerated  Ashley Murrain PT, DPT 02/11/2022 11:44 AM

## 2022-02-11 ENCOUNTER — Encounter: Payer: Self-pay | Admitting: Physical Therapy

## 2022-02-11 ENCOUNTER — Ambulatory Visit: Payer: Medicare Other | Admitting: Physical Therapy

## 2022-02-11 DIAGNOSIS — R2689 Other abnormalities of gait and mobility: Secondary | ICD-10-CM

## 2022-02-11 DIAGNOSIS — M25561 Pain in right knee: Secondary | ICD-10-CM

## 2022-02-11 DIAGNOSIS — M6281 Muscle weakness (generalized): Secondary | ICD-10-CM

## 2022-02-11 DIAGNOSIS — M25562 Pain in left knee: Secondary | ICD-10-CM

## 2022-02-15 NOTE — Therapy (Signed)
OUTPATIENT PHYSICAL THERAPY TREATMENT NOTE   Patient Name: [name] MRN: [id number] DOB:Jun 10, 1950, 72 y.o., female Today's Date: 02/16/2022   PCP: Jilda Panda, MD   REFERRING PROVIDER: Bo Merino, MD  END OF SESSION:   PT End of Session - 02/16/22 1143     Visit Number 12    Number of Visits 19    Date for PT Re-Evaluation 03/12/22    Authorization Type [employer] medicare    Progress Note Due on Visit 20    PT Start Time 0623    PT Stop Time 1225    PT Time Calculation (min) 41 min    Activity Tolerance Patient tolerated treatment well;No increased pain    Behavior During Therapy WFL for tasks assessed/performed                Past Medical History:  Diagnosis Date   Arthritis    Asthma    CAD (coronary artery disease)    CHF (congestive heart failure) (HCC)    Complication of anesthesia    trouble waking up once   COPD (chronic obstructive pulmonary disease) ([place])    Diabetes mellitus without complication ([place])    Dysrhythmia    Heart murmur    Hypertension    Hypothyroidism    Liver disease    NAFLD cirrhosis   Mild aortic stenosis    Pneumonia    Sarcoidosis    Sarcoidosis of lung ([place])    Sarcoidosis of lymph nodes    Past Surgical History:  Procedure Laterality Date   ABDOMINAL HYSTERECTOMY     ABDOMINAL SURGERY     CHOLECYSTECTOMY     ESOPHAGOGASTRODUODENOSCOPY (EGD) WITH PROPOFOL N/A 04/17/2021   Procedure: ESOPHAGOGASTRODUODENOSCOPY (EGD) WITH PROPOFOL;  Surgeon: Carol Ada, MD;  Location: [facility];  Service: Gastroenterology;  Laterality: N/A;   HERNIA REPAIR     LASIK  2001   LEFT HEART CATH AND CORONARY ANGIOGRAPHY N/A 09/23/2020   Procedure: LEFT HEART CATH AND CORONARY ANGIOGRAPHY;  Surgeon: Troy Sine, MD;  Location: [facility];  Service: Cardiovascular;  Laterality: N/A;   nissenfundiplication     TOTAL KNEE ARTHROPLASTY Right 05/19/2021   Procedure: TOTAL KNEE ARTHROPLASTY;  Surgeon: Paralee Cancel, MD;   Location: [facility];  Service: Orthopedics;  Laterality: Right;   Patient Active Problem List   Diagnosis Date Noted   Arthropathy of cervical facet joint 76/28/3151   Diastolic dysfunction 76/16/0737   Other cirrhosis of liver ([place]) 11/20/2021   S/P total knee arthroplasty, right 10/62/6948   Systolic anterior movement of mitral valve 03/30/2021   Chest pain 09/23/2020   Thrombocytopenia ([place]) 09/23/2020   Dehydration 06/22/2012   AKI (acute kidney injury) ([place]) 06/22/2012   Hypothyroidism 06/22/2012   HTN (hypertension) 06/22/2012   Uncontrolled type 2 diabetes mellitus with hyperglycemia ([place]) 06/22/2012   Sarcoidosis 06/22/2012   Heart murmur 06/22/2012   UTI (urinary tract infection) 06/22/2012    REFERRING DIAG: N46.270 (ICD-10-CM) - S/P total knee arthroplasty, right M17.12 (ICD-10-CM) - Primary osteoarthritis of left knee R26.81 (ICD-10-CM) - Unstable gait  THERAPY DIAG:  Right knee pain, unspecified chronicity  Left knee pain, unspecified chronicity  Other abnormalities of gait and mobility  Muscle weakness (generalized)  Rationale for Evaluation and Treatment Rehabilitation  PERTINENT HISTORY: HTN, DM2, R TKA (may 2023), CAD, CHF, HTN   PRECAUTIONS: fall risk  SUBJECTIVE:  SUBJECTIVE STATEMENT:   Pt arrives without any pain in either knee, denies soreness after last session. Notes she has been feeling much better overall, pleased with progress. States she was able to spend time with her horses this weekend   PAIN:  Are you having pain: 02/16/2022  no pain on arrival Location: B knees L>R, anterior and medial How would you describe your pain? burning Best in past week: 0/10 Worst in past week: 4/10 (7/10 on eval) Aggravating factors: transfers, walking/standing (variable, sometimes  has difficulty w/ household distance), difficulty w/ stairs (more so descending) Easing factors: rest, heating pad, walking (sometimes)     OBJECTIVE: (objective measures completed at initial evaluation unless otherwise dated)   DIAGNOSTIC FINDINGS: none recently   PATIENT SURVEYS:  FOTO 47% initial, 53% predicted FOTO 02/09/22: 55%    COGNITION: Overall cognitive status: Within functional limits for tasks assessed                         SENSATION: Grossly intact aside from numbness along medial/anterior R knee which pt states has been consistent since TKA; denies other sensory symptoms     PALPATION: RLE: TTP distal quad, gradually improving proximally LLE: TTP lateral/medial joint lines No patellar tenderness   LOWER EXTREMITY ROM:   Active ROM Right eval Left eval  Hip flexion      Hip extension      Hip abduction      Hip adduction      Hip internal rotation      Hip external rotation      Knee flexion      Knee extension      Ankle dorsiflexion      Ankle plantarflexion      Ankle inversion      Ankle eversion       (Blank rows = not tested)   LOWER EXTREMITY MMT:   MMT Right eval Left eval R/L 02/09/22  Hip flexion 4- 4 4+/4+  Hip extension       Hip abduction modified sitting 4+ 4+ 5/5  Hip adduction       Hip internal rotation       Hip external rotation       Knee flexion 3+p! 4- 4+/4+ painless BIL   Knee extension 4 4 4p! /4+  Ankle dorsiflexion       Ankle plantarflexion       Ankle inversion       Ankle eversion        (Blank rows = not tested)       FUNCTIONAL TESTS:  30sec STS 3.5 repetitions from standard chair, heavy UE use, knee pain B 02/04/22: 30sec STS 6.5 repetitions from standard chair with no UE support, no R knee pain, L knee pain during but no increase after   GAIT: Distance walked: within clinic Assistive device utilized: None Level of assistance: Complete Independence Comments: antalgic gait R>L, reduced knee ROM  throughout all phases of gait (B but more so on R). Mildly flexed trunk posture. Partial step through pattern leading w/ R. Reduced cadence and gait speed.    01/07/22: HR 70s, SpO2 98% w/ SOB in supine position that resolves with sitting position. VSS throughout remainder of session, no further symptoms.   TODAY'S TREATMENT:  [facility] Adult PT Treatment:                                                DATE: 02/16/22 Therapeutic Exercise: 6inch weight shift x5 each LE cues for appropriate  Hamstring curl machine 20# 2x8 cues for pacing and ROM Knee ext machine 5# x10 cues for form and ROM   Therapeutic Activity: STS lowest mat 2x8 cues for setup and pacing  Education on activity modification particularly w/ lower body dressing as pt reports difficulty with socks, education on movement throughout day and relevant anatomy/physiology as it pertains to her activity 6inch step up 2x5 each LE cues for form and mechanics   [facility] Adult PT Treatment:                                                DATE: 02/11/22 Therapeutic Exercise: 2inch step up + RTB TKE 2x5 each LE cues for quad squeeze and mechanics  Standing knee curls 4# ankle weight 2x5 each LE cues for knee/hip positioning  Hip 45 deg kickback 4# 2x5 cues for posture and positioning Lateral stepping at counter 3 laps cues for posture and foot positioning  Sit to stand 3x5 standard chair cues for form and mechanics  Heel slide x12 each LE w/ strap and towel HEP update + handout   [facility] Adult PT Treatment:                                                DATE: 02/09/22 Therapeutic Exercise: 2inch step up x5 each LE cues for control and form 2inch step up + RTB TKE x5 cues for weight shift and form  Knee curls standing 2.5# x8 each LE cues for hip/knee positioning  Hip 45 kickbacks 2.5# 2x5 each LE cues for form Heel raises 2.5# 2x10 cues for form and  pacing  Heel slide w/ strap x12 each LE   Therapeutic Activity: FOTO + education MSK assessment + education    PATIENT EDUCATION:  Education details: rationale for interventions, HEP review, ADLs Person educated: Patient Education method: Explanation, Demonstration, Tactile cues, Verbal cues, and Handouts Education comprehension: verbalized understanding, returned demonstration, verbal cues required, tactile cues required, and needs further education     HOME EXERCISE PROGRAM: Access Code: [email or web address] URL: [email or web address] Date: 02/11/2022 Prepared by: Fransisco Hertz  Exercises - Heel Raises with Counter Support  - 1 x daily - 7 x weekly - 2 sets - 12 reps - Standing Alternating Knee Flexion  - 1 x daily - 7 x weekly - 2 sets - 8 reps - Standing Hip Extension with Counter Support  - 1 x daily - 7 x weekly - 2 sets - 8 reps - Sit to Stand with Armchair  - 1 x daily - 7 x weekly - 3 sets - 5 reps   ASSESSMENT:   CLINICAL IMPRESSION: 02/16/2022 Pt arrives without any pain, reports excellent progress over last couple of weeks. Today able to progress for increased difficulty with closed chain strengthening and addition of machine extension/flexion with good tolerance. Mild transient discomfort with some  exercises but no increase in resting pain or adverse events, pt departs w/ 0/10 pain on NPS. Pt departs today's session in no acute distress, all voiced questions/concerns addressed appropriately from PT perspective.   Eval - Patient is a 72 y.o. woman who was seen today for physical therapy evaluation and treatment for L knee pain and gait issues. Pt reports chronic B knee pain for several years, initially improved after R TKA and subsequent therapy but recently worsening. Pt reports some balance issues but denies falls. Reports variable symptoms but tend to be worst with repetitive weightbearing, most difficulty with sit to stand transfers. Pt reports previously being quite  active, enjoys working with horses, but has had to modify activities due to pain. On exam pt demonstrates notable weakness B LE, R more affected than L, limited by weakness/pain. Gait impairments as above. 30sec sit to stand score indicative of fall risk (<10sec for pt's cohort per [facility]). No adverse events. Significant time spent w/ pt education as above, subsequently will defer HEP establishment until next session. Pt departs today's session in no acute distress, all voiced questions/concerns addressed appropriately from PT perspective.     OBJECTIVE IMPAIRMENTS: Abnormal gait, decreased activity tolerance, decreased balance, decreased endurance, decreased mobility, difficulty walking, decreased ROM, decreased strength, improper body mechanics, and pain.    ACTIVITY LIMITATIONS: carrying, lifting, bending, sitting, standing, squatting, sleeping, stairs, transfers, and locomotion level   PARTICIPATION LIMITATIONS: cleaning, laundry, driving, and community activity   PERSONAL FACTORS: Age, Time since onset of injury/illness/exacerbation, and 3+ comorbidities: HTN, DM, RTKA May 2023, CAD/CHF, HTN  are also affecting patient's functional outcome.    REHAB POTENTIAL: Fair given chronicity and comorbidities   CLINICAL DECISION MAKING: Stable/uncomplicated   EVALUATION COMPLEXITY: Low     GOALS: Goals reviewed with patient? No   SHORT TERM GOALS: Target date: 01/27/2022   Pt will demonstrate appropriate understanding and performance of initially prescribed HEP in order to facilitate improved independence with management of symptoms.  Baseline: HEP provided on eval  Goal status: MET   2. Pt will score greater than or equal to 50 on FOTO in order to demonstrate improved perception of function due to symptoms.            Baseline: 47 02/09/22: 55%            Goal status: MET   LONG TERM GOALS: Target date: 03/12/22 (updated 02/09/22)    Pt will score 53 or greater on FOTO in order to demonstrate  improved perception of function due to symptoms.  Baseline: 47 02/09/22: 55% Goal status: MET   2.  Pt will demonstrate 4+/5 knee flexion/extension MMT B in order to facilitate improved strength for transfers/WB. Baseline: see MMT chart above Goal status: MET   3.  Pt will report ability to stand/walk for up to with less than 3/10 pain on NPS in order to improve tolerance for community ambulation and household tasks. Baseline: variable but up to 7/10 pain with above 02/09/22: remains variable, but generally 1-2 pt increase with ~37min Goal status: PARTIALLY MET   4.  Pt will be able to perform at least 7 repetitions for 30sec sit to stand test in order to indicate reduced fall risk and improved independence w/ transfers. (Per CDC cutoff score for fall risk <10 for women in pt age group) Baseline: 3.5 repetitions w/ knee pain Goal status: INITIAL   5. Pt will be able to ascend/descend 2 standard steps without rail safely w/ less  than 3/10 pain on NPS in order to indicate improved safety w/ home entry Baseline: pain/difficulty with entering home 02/09/22: up to 4/10 pain with stairs Goal status: ONGOING    PLAN (updated 02/09/22):   PT FREQUENCY: 2x/week   PT DURATION: 4 weeks   PLANNED INTERVENTIONS: Therapeutic exercises, Therapeutic activity, Neuromuscular re-education, Balance training, Gait training, Patient/Family education, Self Care, Joint mobilization, Stair training, Aquatic Therapy, Dry Needling, Electrical stimulation, Cryotherapy, Moist heat, Taping, Manual therapy, and Re-evaluation   PLAN FOR NEXT SESSION:  review/update HEP, continue to progress closed chain activity as tolerated. Consider lateral 4 inch step ups  Ashley Murrain PT, DPT 02/16/2022 12:30 PM

## 2022-02-16 ENCOUNTER — Encounter: Payer: Self-pay | Admitting: Physical Therapy

## 2022-02-16 ENCOUNTER — Ambulatory Visit: Payer: Medicare Other | Admitting: Physical Therapy

## 2022-02-16 DIAGNOSIS — M6281 Muscle weakness (generalized): Secondary | ICD-10-CM

## 2022-02-16 DIAGNOSIS — M25562 Pain in left knee: Secondary | ICD-10-CM

## 2022-02-16 DIAGNOSIS — M25561 Pain in right knee: Secondary | ICD-10-CM | POA: Diagnosis not present

## 2022-02-16 DIAGNOSIS — R2689 Other abnormalities of gait and mobility: Secondary | ICD-10-CM

## 2022-02-18 ENCOUNTER — Ambulatory Visit: Payer: Medicare Other | Attending: Cardiology | Admitting: Physical Therapy

## 2022-02-18 ENCOUNTER — Encounter: Payer: Self-pay | Admitting: Physical Therapy

## 2022-02-18 DIAGNOSIS — M6281 Muscle weakness (generalized): Secondary | ICD-10-CM | POA: Diagnosis present

## 2022-02-18 DIAGNOSIS — M25561 Pain in right knee: Secondary | ICD-10-CM | POA: Insufficient documentation

## 2022-02-18 DIAGNOSIS — M25562 Pain in left knee: Secondary | ICD-10-CM | POA: Insufficient documentation

## 2022-02-18 DIAGNOSIS — R2689 Other abnormalities of gait and mobility: Secondary | ICD-10-CM | POA: Insufficient documentation

## 2022-02-18 NOTE — Therapy (Signed)
OUTPATIENT PHYSICAL THERAPY TREATMENT NOTE   Patient Name: [name] MRN: [id number] DOB:1950/03/23, 72 y.o., female Today's Date: 02/18/2022   PCP: Ralene Ok, MD   REFERRING PROVIDER: Pollyann Savoy, MD  END OF SESSION:   PT End of Session - 02/18/22 1102     Visit Number 13    Number of Visits 19    Date for PT Re-Evaluation 03/12/22    Authorization Type [employer] medicare    Progress Note Due on Visit 20    PT Start Time 1102    PT Stop Time 1141    PT Time Calculation (min) 39 min    Activity Tolerance Patient tolerated treatment well;No increased pain    Behavior During Therapy WFL for tasks assessed/performed                 Past Medical History:  Diagnosis Date   Arthritis    Asthma    CAD (coronary artery disease)    CHF (congestive heart failure) (HCC)    Complication of anesthesia    trouble waking up once   COPD (chronic obstructive pulmonary disease) (HCC)    Diabetes mellitus without complication (HCC)    Dysrhythmia    Heart murmur    Hypertension    Hypothyroidism    Liver disease    NAFLD cirrhosis   Mild aortic stenosis    Pneumonia    Sarcoidosis    Sarcoidosis of lung (HCC)    Sarcoidosis of lymph nodes    Past Surgical History:  Procedure Laterality Date   ABDOMINAL HYSTERECTOMY     ABDOMINAL SURGERY     CHOLECYSTECTOMY     ESOPHAGOGASTRODUODENOSCOPY (EGD) WITH PROPOFOL N/A 04/17/2021   Procedure: ESOPHAGOGASTRODUODENOSCOPY (EGD) WITH PROPOFOL;  Surgeon: Jeani Hawking, MD;  Location: [facility];  Service: Gastroenterology;  Laterality: N/A;   HERNIA REPAIR     LASIK  2001   LEFT HEART CATH AND CORONARY ANGIOGRAPHY N/A 09/23/2020   Procedure: LEFT HEART CATH AND CORONARY ANGIOGRAPHY;  Surgeon: Lennette Bihari, MD;  Location: [facility];  Service: Cardiovascular;  Laterality: N/A;   nissenfundiplication     TOTAL KNEE ARTHROPLASTY Right 05/19/2021   Procedure: TOTAL KNEE ARTHROPLASTY;  Surgeon: Durene Romans, MD;   Location: [facility];  Service: Orthopedics;  Laterality: Right;   Patient Active Problem List   Diagnosis Date Noted   Arthropathy of cervical facet joint 11/20/2021   Diastolic dysfunction 11/20/2021   Other cirrhosis of liver (HCC) 11/20/2021   S/P total knee arthroplasty, right 05/19/2021   Systolic anterior movement of mitral valve 03/30/2021   Chest pain 09/23/2020   Thrombocytopenia (HCC) 09/23/2020   Dehydration 06/22/2012   AKI (acute kidney injury) (HCC) 06/22/2012   Hypothyroidism 06/22/2012   HTN (hypertension) 06/22/2012   Uncontrolled type 2 diabetes mellitus with hyperglycemia (HCC) 06/22/2012   Sarcoidosis 06/22/2012   Heart murmur 06/22/2012   UTI (urinary tract infection) 06/22/2012    REFERRING DIAG: R51.884 (ICD-10-CM) - S/P total knee arthroplasty, right M17.12 (ICD-10-CM) - Primary osteoarthritis of left knee R26.81 (ICD-10-CM) - Unstable gait  THERAPY DIAG:  Right knee pain, unspecified chronicity  Left knee pain, unspecified chronicity  Other abnormalities of gait and mobility  Muscle weakness (generalized)  Rationale for Evaluation and Treatment Rehabilitation  PERTINENT HISTORY: HTN, DM2, R TKA (may 2023), CAD, CHF, HTN   PRECAUTIONS: fall risk  SUBJECTIVE:  SUBJECTIVE STATEMENT:   Pt arrives w/ report of stiffness, 1-2/10 pain B knees. No soreness or issues after last session. No other new updates   PAIN:  Are you having pain: 02/18/2022  1-2/10 B knees Location: B knees L>R, anterior and medial How would you describe your pain? burning Best in past week: 0/10 Worst in past week: 4/10 (7/10 on eval) Aggravating factors: transfers, walking/standing (variable, sometimes has difficulty w/ household distance), difficulty w/ stairs (more so descending) Easing factors:  rest, heating pad, walking (sometimes)     OBJECTIVE: (objective measures completed at initial evaluation unless otherwise dated)   DIAGNOSTIC FINDINGS: none recently   PATIENT SURVEYS:  FOTO 47% initial, 53% predicted FOTO 02/09/22: 55%    COGNITION: Overall cognitive status: Within functional limits for tasks assessed                         SENSATION: Grossly intact aside from numbness along medial/anterior R knee which pt states has been consistent since TKA; denies other sensory symptoms     PALPATION: RLE: TTP distal quad, gradually improving proximally LLE: TTP lateral/medial joint lines No patellar tenderness   LOWER EXTREMITY ROM:   Active ROM Right eval Left eval  Hip flexion      Hip extension      Hip abduction      Hip adduction      Hip internal rotation      Hip external rotation      Knee flexion      Knee extension      Ankle dorsiflexion      Ankle plantarflexion      Ankle inversion      Ankle eversion       (Blank rows = not tested)   LOWER EXTREMITY MMT:   MMT Right eval Left eval R/L 02/09/22  Hip flexion 4- 4 4+/4+  Hip extension       Hip abduction modified sitting 4+ 4+ 5/5  Hip adduction       Hip internal rotation       Hip external rotation       Knee flexion 3+p! 4- 4+/4+ painless BIL   Knee extension 4 4 4p! /4+  Ankle dorsiflexion       Ankle plantarflexion       Ankle inversion       Ankle eversion        (Blank rows = not tested)       FUNCTIONAL TESTS:  30sec STS 3.5 repetitions from standard chair, heavy UE use, knee pain B 02/04/22: 30sec STS 6.5 repetitions from standard chair with no UE support, no R knee pain, L knee pain during but no increase after   GAIT: Distance walked: within clinic Assistive device utilized: None Level of assistance: Complete Independence Comments: antalgic gait R>L, reduced knee ROM throughout all phases of gait (B but more so on R). Mildly flexed trunk posture. Partial step through  pattern leading w/ R. Reduced cadence and gait speed.    01/07/22: HR 70s, SpO2 98% w/ SOB in supine position that resolves with sitting position. VSS throughout remainder of session, no further symptoms.   TODAY'S TREATMENT:  [facility]:                                                DATE: 02/18/22 Therapeutic Exercise: Sidestepping along counter 5 laps cues for foot positioning and pacing  Hamstring curls machine 20# 2x10 cues for pacing  Standing cybex hip extension 12.5# x6 cues for posture and UE support  HEP review + handout  Therapeutic Activity: STS 2x10 cues for form and pacing  Fwd step up 6inch 3x5 B LE cues for knee control  Fwd step down 2inch 2x5 each LE cues for control and posture Education on activity at home   [facility]:                                                DATE: 02/16/22 Therapeutic Exercise: 6inch weight shift x5 each LE cues for appropriate  Hamstring curl machine 20# 2x8 cues for pacing and ROM Knee ext machine 5# x10 cues for form and ROM   Therapeutic Activity: STS lowest mat 2x8 cues for setup and pacing  Education on activity modification particularly w/ lower body dressing as pt reports difficulty with socks, education on movement throughout day and relevant anatomy/physiology as it pertains to her activity 6inch step up 2x5 each LE cues for form and mechanics   [facility] PT Treatment:                                                DATE: 02/11/22 Therapeutic Exercise: 2inch step up + RTB TKE 2x5 each LE cues for quad squeeze and mechanics  Standing knee curls 4# ankle weight 2x5 each LE cues for knee/hip positioning  Hip 45 deg kickback 4# 2x5 cues for posture and positioning Lateral stepping at counter 3 laps cues for posture and foot positioning  Sit to stand 3x5 standard chair cues for form and mechanics  Heel slide x12  each LE w/ strap and towel HEP update + handout     PATIENT EDUCATION:  Education details: rationale for interventions, HEP review, ADLs Person educated: Patient Education method: Explanation, Demonstration, Tactile cues, Verbal cues, and Handouts Education comprehension: verbalized understanding, returned demonstration, verbal cues required, tactile cues required, and needs further education     HOME EXERCISE PROGRAM: Access Code: [email or web address] URL: [email or web address] Date: 02/11/2022 Prepared by: Enis Slipper  Exercises - Heel Raises with Counter Support  - 1 x daily - 7 x weekly - 2 sets - 12 reps - Standing Alternating Knee Flexion  - 1 x daily - 7 x weekly - 2 sets - 8 reps - Standing Hip Extension with Counter Support  - 1 x daily - 7 x weekly - 2 sets - 8 reps - Sit to Stand with Armchair  - 1 x daily - 7 x weekly - 3 sets - 5 reps   ASSESSMENT:   CLINICAL IMPRESSION: 02/18/2022 Pt arrives with mild pain, denies issues after last session. Today progressing for increased volume in closed chain, increased resistance for LE strengthening. Introduction of step downs as pt states descending steps remains  most difficult activity for her. No adverse events, pt tolerates well with no pain on departure. Pt departs today's session in no acute distress, all voiced questions/concerns addressed appropriately from PT perspective.    Eval - Patient is a 72 y.o. woman who was seen today for physical therapy evaluation and treatment for L knee pain and gait issues. Pt reports chronic B knee pain for several years, initially improved after R TKA and subsequent therapy but recently worsening. Pt reports some balance issues but denies falls. Reports variable symptoms but tend to be worst with repetitive weightbearing, most difficulty with sit to stand transfers. Pt reports previously being quite active, enjoys working with horses, but has had to modify activities due to pain. On exam pt  demonstrates notable weakness B LE, R more affected than L, limited by weakness/pain. Gait impairments as above. 30sec sit to stand score indicative of fall risk (<10sec for pt's cohort per [facility]). No adverse events. Significant time spent w/ pt education as above, subsequently will defer HEP establishment until next session. Pt departs today's session in no acute distress, all voiced questions/concerns addressed appropriately from PT perspective.     OBJECTIVE IMPAIRMENTS: Abnormal gait, decreased activity tolerance, decreased balance, decreased endurance, decreased mobility, difficulty walking, decreased ROM, decreased strength, improper body mechanics, and pain.    ACTIVITY LIMITATIONS: carrying, lifting, bending, sitting, standing, squatting, sleeping, stairs, transfers, and locomotion level   PARTICIPATION LIMITATIONS: cleaning, laundry, driving, and community activity   PERSONAL FACTORS: Age, Time since onset of injury/illness/exacerbation, and 3+ comorbidities: HTN, DM, RTKA May 2023, CAD/CHF, HTN  are also affecting patient's functional outcome.    REHAB POTENTIAL: Fair given chronicity and comorbidities   CLINICAL DECISION MAKING: Stable/uncomplicated   EVALUATION COMPLEXITY: Low     GOALS: Goals reviewed with patient? No   SHORT TERM GOALS: Target date: 01/27/2022   Pt will demonstrate appropriate understanding and performance of initially prescribed HEP in order to facilitate improved independence with management of symptoms.  Baseline: HEP provided on eval  Goal status: MET   2. Pt will score greater than or equal to 50 on FOTO in order to demonstrate improved perception of function due to symptoms.            Baseline: 47 02/09/22: 55%            Goal status: MET   LONG TERM GOALS: Target date: 03/12/22 (updated 02/09/22)    Pt will score 53 or greater on FOTO in order to demonstrate improved perception of function due to symptoms.  Baseline: 47 02/09/22: 55% Goal status:  MET   2.  Pt will demonstrate 4+/5 knee flexion/extension MMT B in order to facilitate improved strength for transfers/WB. Baseline: see MMT chart above Goal status: MET   3.  Pt will report ability to stand/walk for up to 31min with less than 3/10 pain on NPS in order to improve tolerance for community ambulation and household tasks. Baseline: variable but up to 7/10 pain with above 02/09/22: remains variable, but generally 1-2 pt increase with ~71min Goal status: PARTIALLY MET   4.  Pt will be able to perform at least 7 repetitions for 30sec sit to stand test in order to indicate reduced fall risk and improved independence w/ transfers. (Per CDC cutoff score for fall risk <10 for women in pt age group) Baseline: 3.5 repetitions w/ knee pain Goal status: INITIAL   5. Pt will be able to ascend/descend 2 standard steps without rail safely w/ less  than 3/10 pain on NPS in order to indicate improved safety w/ home entry Baseline: pain/difficulty with entering home 02/09/22: up to 4/10 pain with stairs Goal status: ONGOING    PLAN (updated 02/09/22):   PT FREQUENCY: 2x/week   PT DURATION: 4 weeks   PLANNED INTERVENTIONS: Therapeutic exercises, Therapeutic activity, Neuromuscular re-education, Balance training, Gait training, Patient/Family education, Self Care, Joint mobilization, Stair training, Aquatic Therapy, Dry Needling, Electrical stimulation, Cryotherapy, Moist heat, Taping, Manual therapy, and Re-evaluation   PLAN FOR NEXT SESSION: update HEP, continue to progress closed chain activity as tolerated. Emphasis on stair training   Leeroy Cha PT, DPT 02/18/2022 11:43 AM

## 2022-02-24 NOTE — Therapy (Incomplete)
OUTPATIENT PHYSICAL THERAPY TREATMENT NOTE   Patient Name: [name] MRN: [id number] DOB:[birth year], 72 y.o., female Today's Date: 02/24/2022   PCP: Ralene Ok, MD   REFERRING PROVIDER: Pollyann Savoy, MD  END OF SESSION:         Past Medical History:  Diagnosis Date   Arthritis    Asthma    CAD (coronary artery disease)    CHF (congestive heart failure) (HCC)    Complication of anesthesia    trouble waking up once   COPD (chronic obstructive pulmonary disease) (HCC)    Diabetes mellitus without complication (HCC)    Dysrhythmia    Heart murmur    Hypertension    Hypothyroidism    Liver disease    NAFLD cirrhosis   Mild aortic stenosis    Pneumonia    Sarcoidosis    Sarcoidosis of lung (HCC)    Sarcoidosis of lymph nodes    Past Surgical History:  Procedure Laterality Date   ABDOMINAL HYSTERECTOMY     ABDOMINAL SURGERY     CHOLECYSTECTOMY     ESOPHAGOGASTRODUODENOSCOPY (EGD) WITH PROPOFOL N/A 04/17/2021   Procedure: ESOPHAGOGASTRODUODENOSCOPY (EGD) WITH PROPOFOL;  Surgeon: Jeani Hawking, MD;  Location: [facility];  Service: Gastroenterology;  Laterality: N/A;   HERNIA REPAIR     LASIK  2001   LEFT HEART CATH AND CORONARY ANGIOGRAPHY N/A 09/23/2020   Procedure: LEFT HEART CATH AND CORONARY ANGIOGRAPHY;  Surgeon: Lennette Bihari, MD;  Location: [facility];  Service: Cardiovascular;  Laterality: N/A;   nissenfundiplication     TOTAL KNEE ARTHROPLASTY Right 05/19/2021   Procedure: TOTAL KNEE ARTHROPLASTY;  Surgeon: Durene Romans, MD;  Location: [facility];  Service: Orthopedics;  Laterality: Right;   Patient Active Problem List   Diagnosis Date Noted   Arthropathy of cervical facet joint 11/20/2021   Diastolic dysfunction 11/20/2021   Other cirrhosis of liver (HCC) 11/20/2021   S/P total knee arthroplasty, right 05/19/2021   Systolic anterior movement of mitral valve 03/30/2021   Chest pain 09/23/2020   Thrombocytopenia (HCC) 09/23/2020    Dehydration 06/22/2012   AKI (acute kidney injury) (HCC) 06/22/2012   Hypothyroidism 06/22/2012   HTN (hypertension) 06/22/2012   Uncontrolled type 2 diabetes mellitus with hyperglycemia (HCC) 06/22/2012   Sarcoidosis 06/22/2012   Heart murmur 06/22/2012   UTI (urinary tract infection) 06/22/2012    REFERRING DIAG: Q65.784 (ICD-10-CM) - S/P total knee arthroplasty, right M17.12 (ICD-10-CM) - Primary osteoarthritis of left knee R26.81 (ICD-10-CM) - Unstable gait  THERAPY DIAG:  No diagnosis found.  Rationale for Evaluation and Treatment Rehabilitation  PERTINENT HISTORY: HTN, DM2, R TKA (may 2023), CAD, CHF, HTN   PRECAUTIONS: fall risk  SUBJECTIVE:  SUBJECTIVE STATEMENT:   ***  *** Pt arrives w/ report of stiffness, 1-2/10 pain B knees. No soreness or issues after last session. No other new updates   PAIN:  Are you having pain: 02/24/2022  1-2/10 B knees Location: B knees L>R, anterior and medial How would you describe your pain? burning Best in past week: 0/10 Worst in past week: 4/10 (7/10 on eval) Aggravating factors: transfers, walking/standing (variable, sometimes has difficulty w/ household distance), difficulty w/ stairs (more so descending) Easing factors: rest, heating pad, walking (sometimes)     OBJECTIVE: (objective measures completed at initial evaluation unless otherwise dated)   DIAGNOSTIC FINDINGS: none recently   PATIENT SURVEYS:  FOTO 47% initial, 53% predicted FOTO 02/09/22: 55%    COGNITION: Overall cognitive status: Within functional limits for tasks assessed                         SENSATION: Grossly intact aside from numbness along medial/anterior R knee which pt states has been consistent since TKA; denies other sensory symptoms     PALPATION: RLE: TTP distal  quad, gradually improving proximally LLE: TTP lateral/medial joint lines No patellar tenderness   LOWER EXTREMITY ROM:   Active ROM Right eval Left eval  Hip flexion      Hip extension      Hip abduction      Hip adduction      Hip internal rotation      Hip external rotation      Knee flexion      Knee extension      Ankle dorsiflexion      Ankle plantarflexion      Ankle inversion      Ankle eversion       (Blank rows = not tested)   LOWER EXTREMITY MMT:   MMT Right eval Left eval R/L 02/09/22  Hip flexion 4- 4 4+/4+  Hip extension       Hip abduction modified sitting 4+ 4+ 5/5  Hip adduction       Hip internal rotation       Hip external rotation       Knee flexion 3+p! 4- 4+/4+ painless BIL   Knee extension 4 4 4p! /4+  Ankle dorsiflexion       Ankle plantarflexion       Ankle inversion       Ankle eversion        (Blank rows = not tested)       FUNCTIONAL TESTS:  30sec STS 3.5 repetitions from standard chair, heavy UE use, knee pain B 02/04/22: 30sec STS 6.5 repetitions from standard chair with no UE support, no R knee pain, L knee pain during but no increase after   GAIT: Distance walked: within clinic Assistive device utilized: None Level of assistance: Complete Independence Comments: antalgic gait R>L, reduced knee ROM throughout all phases of gait (B but more so on R). Mildly flexed trunk posture. Partial step through pattern leading w/ R. Reduced cadence and gait speed.    01/07/22: HR 70s, SpO2 98% w/ SOB in supine position that resolves with sitting position. VSS throughout remainder of session, no further symptoms.   TODAY'S TREATMENT:  [facility] Treatment:                                                DATE: 02/25/22 Therapeutic Exercise: *** Manual Therapy: *** Neuromuscular re-ed: *** Therapeutic Activity: *** Modalities: *** Self Care: ***    [facility] Treatment:                                                DATE: 02/18/22 Therapeutic Exercise: Sidestepping along counter 5 laps cues for foot positioning and pacing  Hamstring curls machine 20# 2x10 cues for pacing  Standing cybex hip extension 12.5# x6 cues for posture and UE support  HEP review + handout  Therapeutic Activity: STS 2x10 cues for form and pacing  Fwd step up 6inch 3x5 B LE cues for knee control  Fwd step down 2inch 2x5 each LE cues for control and posture Education on activity at home   [facility] Adult PT Treatment:                                                DATE: 02/16/22 Therapeutic Exercise: 6inch weight shift x5 each LE cues for appropriate  Hamstring curl machine 20# 2x8 cues for pacing and ROM Knee ext machine 5# x10 cues for form and ROM   Therapeutic Activity: STS lowest mat 2x8 cues for setup and pacing  Education on activity modification particularly w/ lower body dressing as pt reports difficulty with socks, education on movement throughout day and relevant anatomy/physiology as it pertains to her activity 6inch step up 2x5 each LE cues for form and mechanics     PATIENT EDUCATION:  Education details: rationale for interventions, HEP review, ADLs Person educated: Patient Education method: Explanation, Demonstration, Tactile cues, Verbal cues, and Handouts Education comprehension: verbalized understanding, returned demonstration, verbal cues required, tactile cues required, and needs further education     HOME EXERCISE PROGRAM: Access Code: [email or web address] URL: [email or web address] Date: 02/11/2022 Prepared by: [employer] with Counter Support  - 1 x daily - 7 x weekly - 2 sets - 12 reps - Standing Alternating Knee Flexion  - 1 x daily - 7 x weekly - 2 sets - 8 reps - Standing Hip Extension with Counter Support  - 1 x daily - 7 x weekly - 2 sets - 8 reps - Sit to Stand with Armchair  - 1 x daily -  7 x weekly - 3 sets - 5 reps   ASSESSMENT:   CLINICAL IMPRESSION: 02/24/2022 ***  *** Pt arrives with mild pain, denies issues after last session. Today progressing for increased volume in closed chain, increased resistance for LE strengthening. Introduction of step downs as pt states descending steps remains most difficult activity for her. No adverse events, pt tolerates well with no pain on departure. Pt departs today's session in no acute distress, all voiced questions/concerns addressed appropriately from PT perspective.    Eval - Patient is a 72 y.o. woman who was seen today for physical therapy evaluation and treatment for L knee pain and gait issues.  Pt reports chronic B knee pain for several years, initially improved after R TKA and subsequent therapy but recently worsening. Pt reports some balance issues but denies falls. Reports variable symptoms but tend to be worst with repetitive weightbearing, most difficulty with sit to stand transfers. Pt reports previously being quite active, enjoys working with horses, but has had to modify activities due to pain. On exam pt demonstrates notable weakness B LE, R more affected than L, limited by weakness/pain. Gait impairments as above. 30sec sit to stand score indicative of fall risk (<10sec for pt's cohort per [facility]). No adverse events. Significant time spent w/ pt education as above, subsequently will defer HEP establishment until next session. Pt departs today's session in no acute distress, all voiced questions/concerns addressed appropriately from PT perspective.     OBJECTIVE IMPAIRMENTS: Abnormal gait, decreased activity tolerance, decreased balance, decreased endurance, decreased mobility, difficulty walking, decreased ROM, decreased strength, improper body mechanics, and pain.    ACTIVITY LIMITATIONS: carrying, lifting, bending, sitting, standing, squatting, sleeping, stairs, transfers, and locomotion level   PARTICIPATION LIMITATIONS:  cleaning, laundry, driving, and community activity   PERSONAL FACTORS: Age, Time since onset of injury/illness/exacerbation, and 3+ comorbidities: HTN, DM, RTKA May 2023, CAD/CHF, HTN  are also affecting patient's functional outcome.    REHAB POTENTIAL: Fair given chronicity and comorbidities   CLINICAL DECISION MAKING: Stable/uncomplicated   EVALUATION COMPLEXITY: Low     GOALS: Goals reviewed with patient? No   SHORT TERM GOALS: Target date: 01/27/2022   Pt will demonstrate appropriate understanding and performance of initially prescribed HEP in order to facilitate improved independence with management of symptoms.  Baseline: HEP provided on eval  Goal status: MET   2. Pt will score greater than or equal to 50 on FOTO in order to demonstrate improved perception of function due to symptoms.            Baseline: 47 02/09/22: 55%            Goal status: MET   LONG TERM GOALS: Target date: 03/12/22 (updated 02/09/22)    Pt will score 53 or greater on FOTO in order to demonstrate improved perception of function due to symptoms.  Baseline: 47 02/09/22: 55% Goal status: MET   2.  Pt will demonstrate 4+/5 knee flexion/extension MMT B in order to facilitate improved strength for transfers/WB. Baseline: see MMT chart above Goal status: MET   3.  Pt will report ability to stand/walk for up to 72min with less than 3/10 pain on NPS in order to improve tolerance for community ambulation and household tasks. Baseline: variable but up to 7/10 pain with above 02/09/22: remains variable, but generally 1-2 pt increase with ~72min Goal status: PARTIALLY MET   4.  Pt will be able to perform at least 7 repetitions for 30sec sit to stand test in order to indicate reduced fall risk and improved independence w/ transfers. (Per CDC cutoff score for fall risk <10 for women in pt age group) Baseline: 3.5 repetitions w/ knee pain Goal status: INITIAL   5. Pt will be able to ascend/descend 2 standard  steps without rail safely w/ less than 3/10 pain on NPS in order to indicate improved safety w/ home entry Baseline: pain/difficulty with entering home 02/09/22: up to 4/10 pain with stairs Goal status: ONGOING    PLAN (updated 02/09/22):   PT FREQUENCY: 2x/week   PT DURATION: 4 weeks   PLANNED INTERVENTIONS: Therapeutic exercises, Therapeutic activity, Neuromuscular re-education, Balance training, Gait training,  Patient/Family education, Self Care, Joint mobilization, Stair training, Aquatic Therapy, Dry Needling, Electrical stimulation, Cryotherapy, Moist heat, Taping, Manual therapy, and Re-evaluation   PLAN FOR NEXT SESSION: *** update HEP, continue to progress closed chain activity as tolerated. Emphasis on stair training   Leeroy Cha PT, DPT 02/24/2022 8:30 AM

## 2022-02-25 ENCOUNTER — Telehealth: Payer: Self-pay | Admitting: Physical Therapy

## 2022-02-25 ENCOUNTER — Ambulatory Visit: Payer: Medicare Other | Admitting: Physical Therapy

## 2022-02-25 NOTE — Telephone Encounter (Signed)
Called pt re: this morning's missed appt - spoke with pt, confirmed date/time for tomorrow's appt

## 2022-02-25 NOTE — Therapy (Signed)
OUTPATIENT PHYSICAL THERAPY TREATMENT NOTE   Patient Name: [name] MRN: [id number] DOB:1950-07-15, 72 y.o., female Today's Date: 02/26/2022   PCP: Jilda Panda, MD   REFERRING PROVIDER: Bo Merino, MD  END OF SESSION:   PT End of Session - 02/26/22 1227     Visit Number 14    Number of Visits 19    Date for PT Re-Evaluation 03/12/22    Authorization Type [employer] medicare    Progress Note Due on Visit 20    PT Start Time 1228    PT Stop Time 1311    PT Time Calculation (min) 43 min    Activity Tolerance Patient tolerated treatment well;No increased pain    Behavior During Therapy WFL for tasks assessed/performed                  Past Medical History:  Diagnosis Date   Arthritis    Asthma    CAD (coronary artery disease)    CHF (congestive heart failure) (HCC)    Complication of anesthesia    trouble waking up once   COPD (chronic obstructive pulmonary disease) ([place])    Diabetes mellitus without complication ([place])    Dysrhythmia    Heart murmur    Hypertension    Hypothyroidism    Liver disease    NAFLD cirrhosis   Mild aortic stenosis    Pneumonia    Sarcoidosis    Sarcoidosis of lung ([place])    Sarcoidosis of lymph nodes    Past Surgical History:  Procedure Laterality Date   ABDOMINAL HYSTERECTOMY     ABDOMINAL SURGERY     CHOLECYSTECTOMY     ESOPHAGOGASTRODUODENOSCOPY (EGD) WITH PROPOFOL N/A 04/17/2021   Procedure: ESOPHAGOGASTRODUODENOSCOPY (EGD) WITH PROPOFOL;  Surgeon: Carol Ada, MD;  Location: [facility];  Service: Gastroenterology;  Laterality: N/A;   HERNIA REPAIR     LASIK  2001   LEFT HEART CATH AND CORONARY ANGIOGRAPHY N/A 09/23/2020   Procedure: LEFT HEART CATH AND CORONARY ANGIOGRAPHY;  Surgeon: Troy Sine, MD;  Location: [facility];  Service: Cardiovascular;  Laterality: N/A;   nissenfundiplication     TOTAL KNEE ARTHROPLASTY Right 05/19/2021   Procedure: TOTAL KNEE ARTHROPLASTY;  Surgeon: Paralee Cancel, MD;   Location: [facility];  Service: Orthopedics;  Laterality: Right;   Patient Active Problem List   Diagnosis Date Noted   Arthropathy of cervical facet joint 99991111   Diastolic dysfunction 99991111   Other cirrhosis of liver ([place]) 11/20/2021   S/P total knee arthroplasty, right [id number]   Systolic anterior movement of mitral valve 03/30/2021   Chest pain 09/23/2020   Thrombocytopenia ([place]) 09/23/2020   Dehydration 06/22/2012   AKI (acute kidney injury) ([place]) 06/22/2012   Hypothyroidism 06/22/2012   HTN (hypertension) 06/22/2012   Uncontrolled type 2 diabetes mellitus with hyperglycemia ([place]) 06/22/2012   Sarcoidosis 06/22/2012   Heart murmur 06/22/2012   UTI (urinary tract infection) 06/22/2012    REFERRING DIAG: [id number] (ICD-10-CM) - S/P total knee arthroplasty, right M17.12 (ICD-10-CM) - Primary osteoarthritis of left knee R26.81 (ICD-10-CM) - Unstable gait  THERAPY DIAG:  Right knee pain, unspecified chronicity  Left knee pain, unspecified chronicity  Other abnormalities of gait and mobility  Muscle weakness (generalized)  Rationale for Evaluation and Treatment Rehabilitation  PERTINENT HISTORY: HTN, DM2, R TKA (may 2023), CAD, CHF, HTN   PRECAUTIONS: fall risk  SUBJECTIVE:  SUBJECTIVE STATEMENT:   Pt arrives with increased pain, she denies any change activities recently, states this is a typical fluctuation. No issues after last session, states exercises have been going well at home. No other new updates.    PAIN:  Are you having pain: 02/26/2022  3/10 B knees (only while sitting or transferring, not while standing/walking) Location: B knees L>R, anterior and medial How would you describe your pain? burning Best in past week: 0/10 Worst in past week: 4/10 (7/10 on eval) Aggravating  factors: transfers, walking/standing (variable, sometimes has difficulty w/ household distance), difficulty w/ stairs (more so descending) Easing factors: rest, heating pad, walking (sometimes)     OBJECTIVE: (objective measures completed at initial evaluation unless otherwise dated)   DIAGNOSTIC FINDINGS: none recently   PATIENT SURVEYS:  FOTO 47% initial, 53% predicted FOTO 02/09/22: 55%    COGNITION: Overall cognitive status: Within functional limits for tasks assessed                         SENSATION: Grossly intact aside from numbness along medial/anterior R knee which pt states has been consistent since TKA; denies other sensory symptoms     PALPATION: RLE: TTP distal quad, gradually improving proximally LLE: TTP lateral/medial joint lines No patellar tenderness   LOWER EXTREMITY ROM:   Active ROM Right eval Left eval  Hip flexion      Hip extension      Hip abduction      Hip adduction      Hip internal rotation      Hip external rotation      Knee flexion      Knee extension      Ankle dorsiflexion      Ankle plantarflexion      Ankle inversion      Ankle eversion       (Blank rows = not tested)   LOWER EXTREMITY MMT:   MMT Right eval Left eval R/L 02/09/22  Hip flexion 4- 4 4+/4+  Hip extension       Hip abduction modified sitting 4+ 4+ 5/5  Hip adduction       Hip internal rotation       Hip external rotation       Knee flexion 3+p! 4- 4+/4+ painless BIL   Knee extension 4 4 4p! /4+  Ankle dorsiflexion       Ankle plantarflexion       Ankle inversion       Ankle eversion        (Blank rows = not tested)       FUNCTIONAL TESTS:  30sec STS 3.5 repetitions from standard chair, heavy UE use, knee pain B 02/04/22: 30sec STS 6.5 repetitions from standard chair with no UE support, no R knee pain, L knee pain during but no increase after   GAIT: Distance walked: within clinic Assistive device utilized: None Level of assistance: Complete  Independence Comments: antalgic gait R>L, reduced knee ROM throughout all phases of gait (B but more so on R). Mildly flexed trunk posture. Partial step through pattern leading w/ R. Reduced cadence and gait speed.    01/07/22: HR 70s, SpO2 98% w/ SOB in supine position that resolves with sitting position. VSS throughout remainder of session, no further symptoms.   TODAY'S TREATMENT:  [facility]:                                                DATE: 02/26/22 Therapeutic Exercise: Mini squat at counter 2x8 Standing marches 2x10 B LE  HEP update + review + handout  Therapeutic Activity: Sidestepping along counter 5 laps cues for posture Fwd/retro walking along counter 5 laps cues for posture Fwd step up 4inch x8 B, 6inch x8, 8inch x5 B cues for form and pacing    [facility] Adult PT Treatment:                                                DATE: 02/18/22 Therapeutic Exercise: Sidestepping along counter 5 laps cues for foot positioning and pacing  Hamstring curls machine 20# 2x10 cues for pacing  Standing cybex hip extension 12.5# x6 cues for posture and UE support  HEP review + handout  Therapeutic Activity: STS 2x10 cues for form and pacing  Fwd step up 6inch 3x5 B LE cues for knee control  Fwd step down 2inch 2x5 each LE cues for control and posture Education on activity at home   [facility] Adult PT Treatment:                                                DATE: 02/16/22 Therapeutic Exercise: 6inch weight shift x5 each LE cues for appropriate  Hamstring curl machine 20# 2x8 cues for pacing and ROM Knee ext machine 5# x10 cues for form and ROM   Therapeutic Activity: STS lowest mat 2x8 cues for setup and pacing  Education on activity modification particularly w/ lower body dressing as pt reports difficulty with socks, education on movement throughout day and relevant anatomy/physiology as  it pertains to her activity 6inch step up 2x5 each LE cues for form and mechanics     PATIENT EDUCATION:  Education details: rationale for interventions, HEP review Person educated: Patient Education method: Explanation, Demonstration, Tactile cues, Verbal cues, and Handouts Education comprehension: verbalized understanding, returned demonstration, verbal cues required, tactile cues required, and needs further education     HOME EXERCISE PROGRAM: Access Code: [email or web address] URL: [email or web address] Date: 02/26/2022 Prepared by: Lindsay with Counter Support  - 1 x daily - 7 x weekly - 2 sets - 12 reps - Mini Squat with Counter Support  - 1 x daily - 7 x weekly - 2 sets - 8 reps - Standing March with Counter Support  - 1 x daily - 7 x weekly - 2 sets - 10 reps   ASSESSMENT:   CLINICAL IMPRESSION: 02/26/2022 Today's session emphasizing progression of closed chain LE strengthening and functional stair navigation. Able to progress for increased volume and intensity of activity despite increased symptoms on arrival, pt tolerates well although does require increased rest breaks due to fatigue. Pt continues to demonstrate limitations in LE strength that are limiting functional mobility. Departs with report of resolved pain, muscular fatigue, no adverse events. Recommend skilled PT to address aforementioned deficits to maximize functional independence and tolerance. Pt departs today's  session in no acute distress, all voiced questions/concerns addressed appropriately from PT perspective.     Eval - Patient is a 72 y.o. woman who was seen today for physical therapy evaluation and treatment for L knee pain and gait issues. Pt reports chronic B knee pain for several years, initially improved after R TKA and subsequent therapy but recently worsening. Pt reports some balance issues but denies falls. Reports variable symptoms but tend to be worst with repetitive  weightbearing, most difficulty with sit to stand transfers. Pt reports previously being quite active, enjoys working with horses, but has had to modify activities due to pain. On exam pt demonstrates notable weakness B LE, R more affected than L, limited by weakness/pain. Gait impairments as above. 30sec sit to stand score indicative of fall risk (<10sec for pt's cohort per [facility]). No adverse events. Significant time spent w/ pt education as above, subsequently will defer HEP establishment until next session. Pt departs today's session in no acute distress, all voiced questions/concerns addressed appropriately from PT perspective.     OBJECTIVE IMPAIRMENTS: Abnormal gait, decreased activity tolerance, decreased balance, decreased endurance, decreased mobility, difficulty walking, decreased ROM, decreased strength, improper body mechanics, and pain.    ACTIVITY LIMITATIONS: carrying, lifting, bending, sitting, standing, squatting, sleeping, stairs, transfers, and locomotion level   PARTICIPATION LIMITATIONS: cleaning, laundry, driving, and community activity   PERSONAL FACTORS: Age, Time since onset of injury/illness/exacerbation, and 3+ comorbidities: HTN, DM, RTKA May 2023, CAD/CHF, HTN  are also affecting patient's functional outcome.    REHAB POTENTIAL: Fair given chronicity and comorbidities   CLINICAL DECISION MAKING: Stable/uncomplicated   EVALUATION COMPLEXITY: Low     GOALS: Goals reviewed with patient? No   SHORT TERM GOALS: Target date: 01/27/2022   Pt will demonstrate appropriate understanding and performance of initially prescribed HEP in order to facilitate improved independence with management of symptoms.  Baseline: HEP provided on eval  Goal status: MET   2. Pt will score greater than or equal to 50 on FOTO in order to demonstrate improved perception of function due to symptoms.            Baseline: 47 02/09/22: 55%            Goal status: MET   LONG TERM GOALS: Target  date: 03/12/22 (updated 02/09/22)    Pt will score 53 or greater on FOTO in order to demonstrate improved perception of function due to symptoms.  Baseline: 47 02/09/22: 55% Goal status: MET   2.  Pt will demonstrate 4+/5 knee flexion/extension MMT B in order to facilitate improved strength for transfers/WB. Baseline: see MMT chart above Goal status: MET   3.  Pt will report ability to stand/walk for up to 37mn with less than 3/10 pain on NPS in order to improve tolerance for community ambulation and household tasks. Baseline: variable but up to 7/10 pain with above 02/09/22: remains variable, but generally 1-2 pt increase with ~348m Goal status: PARTIALLY MET   4.  Pt will be able to perform at least 7 repetitions for 30sec sit to stand test in order to indicate reduced fall risk and improved independence w/ transfers. (Per CDC cutoff score for fall risk <10 for women in pt age group) Baseline: 3.5 repetitions w/ knee pain Goal status: INITIAL   5. Pt will be able to ascend/descend 2 standard steps without rail safely w/ less than 3/10 pain on NPS in order to indicate improved safety w/ home entry Baseline: pain/difficulty with entering  home 02/09/22: up to 4/10 pain with stairs Goal status: ONGOING    PLAN (updated 02/09/22):   PT FREQUENCY: 2x/week   PT DURATION: 4 weeks   PLANNED INTERVENTIONS: Therapeutic exercises, Therapeutic activity, Neuromuscular re-education, Balance training, Gait training, Patient/Family education, Self Care, Joint mobilization, Stair training, Aquatic Therapy, Dry Needling, Electrical stimulation, Cryotherapy, Moist heat, Taping, Manual therapy, and Re-evaluation   PLAN FOR NEXT SESSION: review HEP. continue to progress closed chain activity as tolerated. Emphasis on stair training. Ask about sock aid  Leeroy Cha PT, DPT 02/26/2022 1:17 PM

## 2022-02-26 ENCOUNTER — Ambulatory Visit: Payer: Medicare Other | Admitting: Physical Therapy

## 2022-02-26 ENCOUNTER — Encounter: Payer: Self-pay | Admitting: Physical Therapy

## 2022-02-26 DIAGNOSIS — M25562 Pain in left knee: Secondary | ICD-10-CM

## 2022-02-26 DIAGNOSIS — M25561 Pain in right knee: Secondary | ICD-10-CM | POA: Diagnosis not present

## 2022-02-26 DIAGNOSIS — R2689 Other abnormalities of gait and mobility: Secondary | ICD-10-CM

## 2022-02-26 DIAGNOSIS — M6281 Muscle weakness (generalized): Secondary | ICD-10-CM

## 2022-03-01 NOTE — Therapy (Signed)
OUTPATIENT PHYSICAL THERAPY TREATMENT NOTE   Patient Name: [name] MRN: [id number] DOB:1950-03-21, 72 y.o., female Today's Date: 03/02/2022   PCP: Jilda Panda, MD   REFERRING PROVIDER: Bo Merino, MD  END OF SESSION:   PT End of Session - 03/02/22 1145     Visit Number 15    Number of Visits 19    Date for PT Re-Evaluation 03/12/22    Authorization Type [employer] medicare    Progress Note Due on Visit 20    PT Start Time [id number]    PT Stop Time 1227    PT Time Calculation (min) 42 min    Activity Tolerance Patient tolerated treatment well;No increased pain    Behavior During Therapy WFL for tasks assessed/performed                Past Medical History:  Diagnosis Date   Arthritis    Asthma    CAD (coronary artery disease)    CHF (congestive heart failure) (HCC)    Complication of anesthesia    trouble waking up once   COPD (chronic obstructive pulmonary disease) ([place])    Diabetes mellitus without complication ([place])    Dysrhythmia    Heart murmur    Hypertension    Hypothyroidism    Liver disease    NAFLD cirrhosis   Mild aortic stenosis    Pneumonia    Sarcoidosis    Sarcoidosis of lung ([place])    Sarcoidosis of lymph nodes    Past Surgical History:  Procedure Laterality Date   ABDOMINAL HYSTERECTOMY     ABDOMINAL SURGERY     CHOLECYSTECTOMY     ESOPHAGOGASTRODUODENOSCOPY (EGD) WITH PROPOFOL N/A 04/17/2021   Procedure: ESOPHAGOGASTRODUODENOSCOPY (EGD) WITH PROPOFOL;  Surgeon: Carol Ada, MD;  Location: [facility];  Service: Gastroenterology;  Laterality: N/A;   HERNIA REPAIR     LASIK  2001   LEFT HEART CATH AND CORONARY ANGIOGRAPHY N/A 09/23/2020   Procedure: LEFT HEART CATH AND CORONARY ANGIOGRAPHY;  Surgeon: Troy Sine, MD;  Location: [facility];  Service: Cardiovascular;  Laterality: N/A;   nissenfundiplication     TOTAL KNEE ARTHROPLASTY Right 05/19/2021   Procedure: TOTAL KNEE ARTHROPLASTY;  Surgeon: Paralee Cancel, MD;   Location: [facility];  Service: Orthopedics;  Laterality: Right;   Patient Active Problem List   Diagnosis Date Noted   Arthropathy of cervical facet joint [id number]   Diastolic dysfunction 99991111   Other cirrhosis of liver ([place]) 11/20/2021   S/P total knee arthroplasty, right [id number]   Systolic anterior movement of mitral valve 03/30/2021   Chest pain 09/23/2020   Thrombocytopenia ([place]) 09/23/2020   Dehydration 06/22/2012   AKI (acute kidney injury) ([place]) 06/22/2012   Hypothyroidism 06/22/2012   HTN (hypertension) 06/22/2012   Uncontrolled type 2 diabetes mellitus with hyperglycemia ([place]) 06/22/2012   Sarcoidosis 06/22/2012   Heart murmur 06/22/2012   UTI (urinary tract infection) 06/22/2012    REFERRING DIAG: [id number] (ICD-10-CM) - S/P total knee arthroplasty, right M17.12 (ICD-10-CM) - Primary osteoarthritis of left knee R26.81 (ICD-10-CM) - Unstable gait  THERAPY DIAG:  Right knee pain, unspecified chronicity  Left knee pain, unspecified chronicity  Other abnormalities of gait and mobility  Muscle weakness (generalized)  Rationale for Evaluation and Treatment Rehabilitation  PERTINENT HISTORY: HTN, DM2, R TKA (may 2023), CAD, CHF, HTN   PRECAUTIONS: fall risk  SUBJECTIVE:  SUBJECTIVE STATEMENT:   Pt arrives w/o resting pain, states she has minimal pain today w/ descending stairs and with standing. Rates 1/10 B knees overall this morning, felt good after last session.    PAIN:  Are you having pain: 03/02/2022  1/10 B knees  Location: B knees L>R, anterior and medial How would you describe your pain? burning Best in past week: 0/10 Worst in past week: 4/10 (7/10 on eval) Aggravating factors: transfers, walking/standing (variable, sometimes has difficulty w/ household distance),  difficulty w/ stairs (more so descending) Easing factors: rest, heating pad, walking (sometimes)     OBJECTIVE: (objective measures completed at initial evaluation unless otherwise dated)   DIAGNOSTIC FINDINGS: none recently   PATIENT SURVEYS:  FOTO 47% initial, 53% predicted FOTO 02/09/22: 55%    COGNITION: Overall cognitive status: Within functional limits for tasks assessed                         SENSATION: Grossly intact aside from numbness along medial/anterior R knee which pt states has been consistent since TKA; denies other sensory symptoms     PALPATION: RLE: TTP distal quad, gradually improving proximally LLE: TTP lateral/medial joint lines No patellar tenderness   LOWER EXTREMITY ROM:   Active ROM Right eval Left eval  Hip flexion      Hip extension      Hip abduction      Hip adduction      Hip internal rotation      Hip external rotation      Knee flexion      Knee extension      Ankle dorsiflexion      Ankle plantarflexion      Ankle inversion      Ankle eversion       (Blank rows = not tested)   LOWER EXTREMITY MMT:   MMT Right eval Left eval R/L 02/09/22  Hip flexion 4- 4 4+/4+  Hip extension       Hip abduction modified sitting 4+ 4+ 5/5  Hip adduction       Hip internal rotation       Hip external rotation       Knee flexion 3+p! 4- 4+/4+ painless BIL   Knee extension 4 4 4p! /4+  Ankle dorsiflexion       Ankle plantarflexion       Ankle inversion       Ankle eversion        (Blank rows = not tested)       FUNCTIONAL TESTS:  30sec STS 3.5 repetitions from standard chair, heavy UE use, knee pain B 02/04/22: 30sec STS 6.5 repetitions from standard chair with no UE support, no R knee pain, L knee pain during but no increase after   GAIT: Distance walked: within clinic Assistive device utilized: None Level of assistance: Complete Independence Comments: antalgic gait R>L, reduced knee ROM throughout all phases of gait (B but more so  on R). Mildly flexed trunk posture. Partial step through pattern leading w/ R. Reduced cadence and gait speed.    01/07/22: HR 70s, SpO2 98% w/ SOB in supine position that resolves with sitting position. VSS throughout remainder of session, no further symptoms.   TODAY'S TREATMENT:  [facility]:                                                DATE: 03/02/22 Therapeutic Exercise: 4 inch lateral step downs 2x8 B LE cues for form and hip positioning  Standing TKE ball at wall x15 B LE cues for form, posture, and alignment   Therapeutic Activity: 6inch fwd step up 2x8 B LE cues for form, weightshifting, and control 2inch fwd step down 2x8 each LE cues for form and control Mini squat 2x10 at counter cues for form and posture   [facility] Adult PT Treatment:                                                DATE: 02/26/22 Therapeutic Exercise: Mini squat at counter 2x8 Standing marches 2x10 B LE  HEP update + review + handout  Therapeutic Activity: Sidestepping along counter 5 laps cues for posture Fwd/retro walking along counter 5 laps cues for posture Fwd step up 4inch x8 B, 6inch x8, 8inch x5 B cues for form and pacing    [facility] Adult PT Treatment:                                                DATE: 02/18/22 Therapeutic Exercise: Sidestepping along counter 5 laps cues for foot positioning and pacing  Hamstring curls machine 20# 2x10 cues for pacing  Standing cybex hip extension 12.5# x6 cues for posture and UE support  HEP review + handout  Therapeutic Activity: STS 2x10 cues for form and pacing  Fwd step up 6inch 3x5 B LE cues for knee control  Fwd step down 2inch 2x5 each LE cues for control and posture Education on activity at home    PATIENT EDUCATION:  Education details: rationale for interventions, HEP review Person educated: Patient Education method: Explanation, Demonstration,  Tactile cues, Verbal cues, and Handouts Education comprehension: verbalized understanding, returned demonstration, verbal cues required, tactile cues required, and needs further education     HOME EXERCISE PROGRAM: Access Code: [email or web address] URL: [email or web address] Date: 02/26/2022 Prepared by: [place] with Counter Support  - 1 x daily - 7 x weekly - 2 sets - 12 reps - Mini Squat with Counter Support  - 1 x daily - 7 x weekly - 2 sets - 8 reps - Standing March with Counter Support  - 1 x daily - 7 x weekly - 2 sets - 10 reps   ASSESSMENT:   CLINICAL IMPRESSION: 03/02/2022 Pt arrives w/ 1/10 B knees, reports continuing to feel better overall. Continues to endorse most difficulty w/ descending stairs. Today focusing on increasing closed chain strength/control with emphasis on stair navigation and mechanics w/ weight shifting. Pt with primary report of muscular fatigue that benefits from increased rest breaks, no adverse events or increase in resting pain. Pt departs today's session in no acute distress, all voiced questions/concerns addressed appropriately from PT perspective.    Eval - Patient is a 72 y.o. woman who was seen today for physical therapy evaluation and treatment for L  knee pain and gait issues. Pt reports chronic B knee pain for several years, initially improved after R TKA and subsequent therapy but recently worsening. Pt reports some balance issues but denies falls. Reports variable symptoms but tend to be worst with repetitive weightbearing, most difficulty with sit to stand transfers. Pt reports previously being quite active, enjoys working with horses, but has had to modify activities due to pain. On exam pt demonstrates notable weakness B LE, R more affected than L, limited by weakness/pain. Gait impairments as above. 30sec sit to stand score indicative of fall risk (<10sec for pt's cohort per [facility]). No adverse events. Significant time  spent w/ pt education as above, subsequently will defer HEP establishment until next session. Pt departs today's session in no acute distress, all voiced questions/concerns addressed appropriately from PT perspective.     OBJECTIVE IMPAIRMENTS: Abnormal gait, decreased activity tolerance, decreased balance, decreased endurance, decreased mobility, difficulty walking, decreased ROM, decreased strength, improper body mechanics, and pain.    ACTIVITY LIMITATIONS: carrying, lifting, bending, sitting, standing, squatting, sleeping, stairs, transfers, and locomotion level   PARTICIPATION LIMITATIONS: cleaning, laundry, driving, and community activity   PERSONAL FACTORS: Age, Time since onset of injury/illness/exacerbation, and 3+ comorbidities: HTN, DM, RTKA May 2023, CAD/CHF, HTN  are also affecting patient's functional outcome.    REHAB POTENTIAL: Fair given chronicity and comorbidities   CLINICAL DECISION MAKING: Stable/uncomplicated   EVALUATION COMPLEXITY: Low     GOALS: Goals reviewed with patient? No   SHORT TERM GOALS: Target date: 01/27/2022   Pt will demonstrate appropriate understanding and performance of initially prescribed HEP in order to facilitate improved independence with management of symptoms.  Baseline: HEP provided on eval  Goal status: MET   2. Pt will score greater than or equal to 50 on FOTO in order to demonstrate improved perception of function due to symptoms.            Baseline: 47 02/09/22: 55%            Goal status: MET   LONG TERM GOALS: Target date: 03/12/22 (updated 02/09/22)    Pt will score 53 or greater on FOTO in order to demonstrate improved perception of function due to symptoms.  Baseline: 47 02/09/22: 55% Goal status: MET   2.  Pt will demonstrate 4+/5 knee flexion/extension MMT B in order to facilitate improved strength for transfers/WB. Baseline: see MMT chart above Goal status: MET   3.  Pt will report ability to stand/walk for up to  89mn with less than 3/10 pain on NPS in order to improve tolerance for community ambulation and household tasks. Baseline: variable but up to 7/10 pain with above 02/09/22: remains variable, but generally 1-2 pt increase with ~329m Goal status: PARTIALLY MET   4.  Pt will be able to perform at least 7 repetitions for 30sec sit to stand test in order to indicate reduced fall risk and improved independence w/ transfers. (Per CDC cutoff score for fall risk <10 for women in pt age group) Baseline: 3.5 repetitions w/ knee pain Goal status: INITIAL   5. Pt will be able to ascend/descend 2 standard steps without rail safely w/ less than 3/10 pain on NPS in order to indicate improved safety w/ home entry Baseline: pain/difficulty with entering home 02/09/22: up to 4/10 pain with stairs Goal status: ONGOING    PLAN (updated 02/09/22):   PT FREQUENCY: 2x/week   PT DURATION: 4 weeks   PLANNED INTERVENTIONS: Therapeutic exercises, Therapeutic activity, Neuromuscular  re-education, Balance training, Gait training, Patient/Family education, Self Care, Joint mobilization, Stair training, Aquatic Therapy, Dry Needling, Electrical stimulation, Cryotherapy, Moist heat, Taping, Manual therapy, and Re-evaluation   PLAN FOR NEXT SESSION: review HEP. continue to progress closed chain activity as tolerated. Emphasis on stair training. Ask about sock aid   Leeroy Cha PT, DPT 03/02/2022 12:31 PM

## 2022-03-02 ENCOUNTER — Ambulatory Visit: Payer: Medicare Other | Admitting: Physical Therapy

## 2022-03-02 ENCOUNTER — Encounter: Payer: Self-pay | Admitting: Physical Therapy

## 2022-03-02 DIAGNOSIS — M25561 Pain in right knee: Secondary | ICD-10-CM | POA: Diagnosis not present

## 2022-03-02 DIAGNOSIS — M6281 Muscle weakness (generalized): Secondary | ICD-10-CM

## 2022-03-02 DIAGNOSIS — M25562 Pain in left knee: Secondary | ICD-10-CM

## 2022-03-02 DIAGNOSIS — R2689 Other abnormalities of gait and mobility: Secondary | ICD-10-CM

## 2022-03-05 ENCOUNTER — Ambulatory Visit: Payer: Medicare Other | Admitting: Physical Therapy

## 2022-03-05 ENCOUNTER — Encounter: Payer: Self-pay | Admitting: Physical Therapy

## 2022-03-05 DIAGNOSIS — R2689 Other abnormalities of gait and mobility: Secondary | ICD-10-CM

## 2022-03-05 DIAGNOSIS — M25561 Pain in right knee: Secondary | ICD-10-CM | POA: Diagnosis not present

## 2022-03-05 DIAGNOSIS — M25562 Pain in left knee: Secondary | ICD-10-CM

## 2022-03-05 DIAGNOSIS — M6281 Muscle weakness (generalized): Secondary | ICD-10-CM

## 2022-03-05 NOTE — Therapy (Signed)
OUTPATIENT PHYSICAL THERAPY TREATMENT NOTE   Patient Name: [name] MRN: [id number] DOB:02/04/1950, 72 y.o., female Today's Date: 03/05/2022   PCP: Jilda Panda, MD   REFERRING PROVIDER: Bo Merino, MD  END OF SESSION:   PT End of Session - 03/05/22 1057     Visit Number 16    Number of Visits 19    Date for PT Re-Evaluation 03/12/22    Authorization Type [employer] medicare    Progress Note Due on Visit 20    PT Start Time 1058    PT Stop Time 1145    PT Time Calculation (min) 47 min    Activity Tolerance Patient tolerated treatment well;No increased pain    Behavior During Therapy WFL for tasks assessed/performed                 Past Medical History:  Diagnosis Date   Arthritis    Asthma    CAD (coronary artery disease)    CHF (congestive heart failure) (HCC)    Complication of anesthesia    trouble waking up once   COPD (chronic obstructive pulmonary disease) ([place])    Diabetes mellitus without complication ([place])    Dysrhythmia    Heart murmur    Hypertension    Hypothyroidism    Liver disease    NAFLD cirrhosis   Mild aortic stenosis    Pneumonia    Sarcoidosis    Sarcoidosis of lung ([place])    Sarcoidosis of lymph nodes    Past Surgical History:  Procedure Laterality Date   ABDOMINAL HYSTERECTOMY     ABDOMINAL SURGERY     CHOLECYSTECTOMY     ESOPHAGOGASTRODUODENOSCOPY (EGD) WITH PROPOFOL N/A 04/17/2021   Procedure: ESOPHAGOGASTRODUODENOSCOPY (EGD) WITH PROPOFOL;  Surgeon: Carol Ada, MD;  Location: [facility];  Service: Gastroenterology;  Laterality: N/A;   HERNIA REPAIR     LASIK  2001   LEFT HEART CATH AND CORONARY ANGIOGRAPHY N/A 09/23/2020   Procedure: LEFT HEART CATH AND CORONARY ANGIOGRAPHY;  Surgeon: Troy Sine, MD;  Location: [facility];  Service: Cardiovascular;  Laterality: N/A;   nissenfundiplication     TOTAL KNEE ARTHROPLASTY Right 05/19/2021   Procedure: TOTAL KNEE ARTHROPLASTY;  Surgeon: Paralee Cancel, MD;   Location: [facility];  Service: Orthopedics;  Laterality: Right;   Patient Active Problem List   Diagnosis Date Noted   Arthropathy of cervical facet joint 99991111   Diastolic dysfunction 99991111   Other cirrhosis of liver ([place]) 11/20/2021   S/P total knee arthroplasty, right [id number]   Systolic anterior movement of mitral valve 03/30/2021   Chest pain 09/23/2020   Thrombocytopenia ([place]) 09/23/2020   Dehydration 06/22/2012   AKI (acute kidney injury) ([place]) 06/22/2012   Hypothyroidism 06/22/2012   HTN (hypertension) 06/22/2012   Uncontrolled type 2 diabetes mellitus with hyperglycemia ([place]) 06/22/2012   Sarcoidosis 06/22/2012   Heart murmur 06/22/2012   UTI (urinary tract infection) 06/22/2012    REFERRING DIAG: [id number] (ICD-10-CM) - S/P total knee arthroplasty, right M17.12 (ICD-10-CM) - Primary osteoarthritis of left knee R26.81 (ICD-10-CM) - Unstable gait  THERAPY DIAG:  Right knee pain, unspecified chronicity  Left knee pain, unspecified chronicity  Other abnormalities of gait and mobility  Muscle weakness (generalized)  Rationale for Evaluation and Treatment Rehabilitation  PERTINENT HISTORY: HTN, DM2, R TKA (may 2023), CAD, CHF, HTN   PRECAUTIONS: fall risk  SUBJECTIVE:  SUBJECTIVE STATEMENT:   Pt arrives w/ 2-3/10 pain at present. Mild soreness after last session that cleared up the next day. Has been working on stairs at home, continues to endorse improvements in balance and walking, less pain overall   PAIN:  Are you having pain: 03/05/2022  2-3/10 B knees  Location: B knees L>R, anterior and medial How would you describe your pain? burning Best in past week: 0/10 Worst in past week: 4/10 (7/10 on eval) Aggravating factors: transfers, walking/standing (variable, sometimes has  difficulty w/ household distance), difficulty w/ stairs (more so descending) Easing factors: rest, heating pad, walking (sometimes)     OBJECTIVE: (objective measures completed at initial evaluation unless otherwise dated)   DIAGNOSTIC FINDINGS: none recently   PATIENT SURVEYS:  FOTO 47% initial, 53% predicted FOTO 02/09/22: 55%    COGNITION: Overall cognitive status: Within functional limits for tasks assessed                         SENSATION: Grossly intact aside from numbness along medial/anterior R knee which pt states has been consistent since TKA; denies other sensory symptoms     PALPATION: RLE: TTP distal quad, gradually improving proximally LLE: TTP lateral/medial joint lines No patellar tenderness   LOWER EXTREMITY ROM:   Active ROM Right eval Left eval  Hip flexion      Hip extension      Hip abduction      Hip adduction      Hip internal rotation      Hip external rotation      Knee flexion      Knee extension      Ankle dorsiflexion      Ankle plantarflexion      Ankle inversion      Ankle eversion       (Blank rows = not tested)   LOWER EXTREMITY MMT:   MMT Right eval Left eval R/L 02/09/22  Hip flexion 4- 4 4+/4+  Hip extension       Hip abduction modified sitting 4+ 4+ 5/5  Hip adduction       Hip internal rotation       Hip external rotation       Knee flexion 3+p! 4- 4+/4+ painless BIL   Knee extension 4 4 4p! /4+  Ankle dorsiflexion       Ankle plantarflexion       Ankle inversion       Ankle eversion        (Blank rows = not tested)       FUNCTIONAL TESTS:  30sec STS 3.5 repetitions from standard chair, heavy UE use, knee pain B 02/04/22: 30sec STS 6.5 repetitions from standard chair with no UE support, no R knee pain, L knee pain during but no increase after  03/05/22:   - 5xSTS: 11sec standard chair no UE support  - 30sec STS: 13 repetitions standard chair no UE support, exertional SOB that recovers well, vitals  WNL  GAIT: Distance walked: within clinic Assistive device utilized: None Level of assistance: Complete Independence Comments: antalgic gait R>L, reduced knee ROM throughout all phases of gait (B but more so on R). Mildly flexed trunk posture. Partial step through pattern leading w/ R. Reduced cadence and gait speed.    01/07/22: HR 70s, SpO2 98% w/ SOB in supine position that resolves with sitting position. VSS throughout remainder of session, no further symptoms.   TODAY'S TREATMENT:  [facility]:                                                DATE: 03/05/22 Therapeutic Exercise: Seated adductor isos 2x15 cues for posture  Sidestepping along counter 5 laps cues for posture and foot positioning  Lateral step up 6inch step 2x6 cues for form and posture, hip mechanics, gentle UE support  Cybex hip extension 25# x8 each LE  62mn walking cool down moderate->light exertion  Therapeutic Activity: STS lowest mat x10 no UE  5xSTS + education 30sec STS + vitals + education Discussion re: stair navigation and strategies to improve comfort/safety, activity modification/pacing    [facility] PT Treatment:                                                DATE: 03/02/22 Therapeutic Exercise: 4 inch lateral step downs 2x8 B LE cues for form and hip positioning  Standing TKE ball at wall x15 B LE cues for form, posture, and alignment   Therapeutic Activity: 6inch fwd step up 2x8 B LE cues for form, weightshifting, and control 2inch fwd step down 2x8 each LE cues for form and control Mini squat 2x10 at counter cues for form and posture   [facility] Adult PT Treatment:                                                DATE: 02/26/22 Therapeutic Exercise: Mini squat at counter 2x8 Standing marches 2x10 B LE  HEP update + review + handout  Therapeutic Activity: Sidestepping along counter 5 laps cues for  posture Fwd/retro walking along counter 5 laps cues for posture Fwd step up 4inch x8 B, 6inch x8, 8inch x5 B cues for form and pacing     PATIENT EDUCATION:  Education details: rationale for interventions, HEP review Person educated: Patient Education method: Explanation, Demonstration, Tactile cues, Verbal cues, and Handouts Education comprehension: verbalized understanding, returned demonstration, verbal cues required, tactile cues required, and needs further education     HOME EXERCISE PROGRAM: Access Code: [email or web address] URL: [email or web address] Date: 02/26/2022 Prepared by: [place] Counter Support  - 1 x daily - 7 x weekly - 2 sets - 12 reps - Mini Squat with Counter Support  - 1 x daily - 7 x weekly - 2 sets - 8 reps - Standing March with Counter Support  - 1 x daily - 7 x weekly - 2 sets - 10 reps   ASSESSMENT:   CLINICAL IMPRESSION: 03/05/2022 Pt arrives w/ 2-3/10 pain, states she continues to improve overall. Today looked at [place] pt performing 13 repetitions which is much improved compared to 3.5 repetitions at start of care and 6.5 repetitions at last assessment. Also looked at 5xSTS, pt performs in 11sec which is outside fall risk category (12-14sec in most populations). Exertional SOB w/ 30sec STS but recovers well with rest and VSS. Pt tolerates additional exercises well without increase in pain or adverse events, cues as above, addition of walking cool down at end of session. Pt  denies any pain on departure, states she feels "great". Pt departs today's session in no acute distress, all voiced questions/concerns addressed appropriately from PT perspective.    Eval - Patient is a 71 y.o. woman who was seen today for physical therapy evaluation and treatment for L knee pain and gait issues. Pt reports chronic B knee pain for several years, initially improved after R TKA and subsequent therapy but recently worsening. Pt reports  some balance issues but denies falls. Reports variable symptoms but tend to be worst with repetitive weightbearing, most difficulty with sit to stand transfers. Pt reports previously being quite active, enjoys working with horses, but has had to modify activities due to pain. On exam pt demonstrates notable weakness B LE, R more affected than L, limited by weakness/pain. Gait impairments as above. 30sec sit to stand score indicative of fall risk (<10sec for pt's cohort per [facility]). No adverse events. Significant time spent w/ pt education as above, subsequently will defer HEP establishment until next session. Pt departs today's session in no acute distress, all voiced questions/concerns addressed appropriately from PT perspective.     OBJECTIVE IMPAIRMENTS: Abnormal gait, decreased activity tolerance, decreased balance, decreased endurance, decreased mobility, difficulty walking, decreased ROM, decreased strength, improper body mechanics, and pain.    ACTIVITY LIMITATIONS: carrying, lifting, bending, sitting, standing, squatting, sleeping, stairs, transfers, and locomotion level   PARTICIPATION LIMITATIONS: cleaning, laundry, driving, and community activity   PERSONAL FACTORS: Age, Time since onset of injury/illness/exacerbation, and 3+ comorbidities: HTN, DM, RTKA May 2023, CAD/CHF, HTN  are also affecting patient's functional outcome.    REHAB POTENTIAL: Fair given chronicity and comorbidities   CLINICAL DECISION MAKING: Stable/uncomplicated   EVALUATION COMPLEXITY: Low     GOALS: Goals reviewed with patient? No   SHORT TERM GOALS: Target date: 01/27/2022   Pt will demonstrate appropriate understanding and performance of initially prescribed HEP in order to facilitate improved independence with management of symptoms.  Baseline: HEP provided on eval  Goal status: MET   2. Pt will score greater than or equal to 50 on FOTO in order to demonstrate improved perception of function due to  symptoms.            Baseline: 47 02/09/22: 55%            Goal status: MET   LONG TERM GOALS: Target date: 03/12/22 (updated 02/09/22)    Pt will score 53 or greater on FOTO in order to demonstrate improved perception of function due to symptoms.  Baseline: 47 02/09/22: 55% Goal status: MET   2.  Pt will demonstrate 4+/5 knee flexion/extension MMT B in order to facilitate improved strength for transfers/WB. Baseline: see MMT chart above Goal status: MET   3.  Pt will report ability to stand/walk for up to 21mn with less than 3/10 pain on NPS in order to improve tolerance for community ambulation and household tasks. Baseline: variable but up to 7/10 pain with above 02/09/22: remains variable, but generally 1-2 pt increase with ~356m Goal status: PARTIALLY MET   4.  Pt will be able to perform at least 7 repetitions for 30sec sit to stand test in order to indicate reduced fall risk and improved independence w/ transfers. (Per CDC cutoff score for fall risk <10 for women in pt age group) Baseline: 3.5 repetitions w/ knee pain 03/05/22: 13 repetitions  Goal status: MET    5. Pt will be able to ascend/descend 2 standard steps without rail safely w/ less than 3/10  pain on NPS in order to indicate improved safety w/ home entry Baseline: pain/difficulty with entering home 02/09/22: up to 4/10 pain with stairs Goal status: ONGOING    PLAN (updated 02/09/22):   PT FREQUENCY: 2x/week   PT DURATION: 4 weeks   PLANNED INTERVENTIONS: Therapeutic exercises, Therapeutic activity, Neuromuscular re-education, Balance training, Gait training, Patient/Family education, Self Care, Joint mobilization, Stair training, Aquatic Therapy, Dry Needling, Electrical stimulation, Cryotherapy, Moist heat, Taping, Manual therapy, and Re-evaluation   PLAN FOR NEXT SESSION: review/update HEP PRN. Work on IT trainer with emphasis on descent, eccentric knee control and hip strengthening  Leeroy Cha PT,  DPT 03/05/2022 11:51 AM

## 2022-03-09 ENCOUNTER — Ambulatory Visit: Payer: Medicare Other

## 2022-03-09 NOTE — Therapy (Incomplete)
OUTPATIENT PHYSICAL THERAPY TREATMENT NOTE   Patient Name: [name] MRN: [id number] DOB:Dec 23, 1950, 72 y.o., female Today's Date: 03/09/2022   PCP: Jilda Panda, MD   REFERRING PROVIDER: Bo Merino, MD  END OF SESSION:         Past Medical History:  Diagnosis Date   Arthritis    Asthma    CAD (coronary artery disease)    CHF (congestive heart failure) ([place])    Complication of anesthesia    trouble waking up once   COPD (chronic obstructive pulmonary disease) ([place])    Diabetes mellitus without complication ([place])    Dysrhythmia    Heart murmur    Hypertension    Hypothyroidism    Liver disease    NAFLD cirrhosis   Mild aortic stenosis    Pneumonia    Sarcoidosis    Sarcoidosis of lung ([place])    Sarcoidosis of lymph nodes    Past Surgical History:  Procedure Laterality Date   ABDOMINAL HYSTERECTOMY     ABDOMINAL SURGERY     CHOLECYSTECTOMY     ESOPHAGOGASTRODUODENOSCOPY (EGD) WITH PROPOFOL N/A 04/17/2021   Procedure: ESOPHAGOGASTRODUODENOSCOPY (EGD) WITH PROPOFOL;  Surgeon: Carol Ada, MD;  Location: [facility];  Service: Gastroenterology;  Laterality: N/A;   HERNIA REPAIR     LASIK  2001   LEFT HEART CATH AND CORONARY ANGIOGRAPHY N/A 09/23/2020   Procedure: LEFT HEART CATH AND CORONARY ANGIOGRAPHY;  Surgeon: Troy Sine, MD;  Location: [facility];  Service: Cardiovascular;  Laterality: N/A;   nissenfundiplication     TOTAL KNEE ARTHROPLASTY Right 05/19/2021   Procedure: TOTAL KNEE ARTHROPLASTY;  Surgeon: Paralee Cancel, MD;  Location: [facility];  Service: Orthopedics;  Laterality: Right;   Patient Active Problem List   Diagnosis Date Noted   Arthropathy of cervical facet joint 99991111   Diastolic dysfunction 99991111   Other cirrhosis of liver ([place]) 11/20/2021   S/P total knee arthroplasty, right [id number]   Systolic anterior movement of mitral valve 03/30/2021   Chest pain 09/23/2020   Thrombocytopenia ([place]) 09/23/2020    Dehydration 06/22/2012   AKI (acute kidney injury) ([place]) 06/22/2012   Hypothyroidism 06/22/2012   HTN (hypertension) 06/22/2012   Uncontrolled type 2 diabetes mellitus with hyperglycemia ([place]) 06/22/2012   Sarcoidosis 06/22/2012   Heart murmur 06/22/2012   UTI (urinary tract infection) 06/22/2012    REFERRING DIAG: [id number] (ICD-10-CM) - S/P total knee arthroplasty, right M17.12 (ICD-10-CM) - Primary osteoarthritis of left knee R26.81 (ICD-10-CM) - Unstable gait  THERAPY DIAG:  No diagnosis found.  Rationale for Evaluation and Treatment Rehabilitation  PERTINENT HISTORY: HTN, DM2, R TKA (may 2023), CAD, CHF, HTN   PRECAUTIONS: fall risk  SUBJECTIVE:  SUBJECTIVE STATEMENT:   *** Pt arrives w/ 2-3/10 pain at present. Mild soreness after last session that cleared up the next day. Has been working on stairs at home, continues to endorse improvements in balance and walking, less pain overall   PAIN:  Are you having pain:  *** 2-3/10 B knees  Location: B knees L>R, anterior and medial How would you describe your pain? burning Best in past week: 0/10 Worst in past week: 4/10 (7/10 on eval) Aggravating factors: transfers, walking/standing (variable, sometimes has difficulty w/ household distance), difficulty w/ stairs (more so descending) Easing factors: rest, heating pad, walking (sometimes)     OBJECTIVE: (objective measures completed at initial evaluation unless otherwise dated)   DIAGNOSTIC FINDINGS: none recently   PATIENT SURVEYS:  FOTO 47% initial, 53% predicted FOTO 02/09/22: 55%    COGNITION: Overall cognitive status: Within functional limits for tasks assessed                         SENSATION: Grossly intact aside from numbness along medial/anterior R knee which pt states has been  consistent since TKA; denies other sensory symptoms     PALPATION: RLE: TTP distal quad, gradually improving proximally LLE: TTP lateral/medial joint lines No patellar tenderness   LOWER EXTREMITY ROM:   Active ROM Right eval Left eval  Hip flexion      Hip extension      Hip abduction      Hip adduction      Hip internal rotation      Hip external rotation      Knee flexion      Knee extension      Ankle dorsiflexion      Ankle plantarflexion      Ankle inversion      Ankle eversion       (Blank rows = not tested)   LOWER EXTREMITY MMT:   MMT Right eval Left eval R/L 02/09/22  Hip flexion 4- 4 4+/4+  Hip extension       Hip abduction modified sitting 4+ 4+ 5/5  Hip adduction       Hip internal rotation       Hip external rotation       Knee flexion 3+p! 4- 4+/4+ painless BIL   Knee extension 4 4 4p! /4+  Ankle dorsiflexion       Ankle plantarflexion       Ankle inversion       Ankle eversion        (Blank rows = not tested)       FUNCTIONAL TESTS:  30sec STS 3.5 repetitions from standard chair, heavy UE use, knee pain B 02/04/22: 30sec STS 6.5 repetitions from standard chair with no UE support, no R knee pain, L knee pain during but no increase after  03/05/22:   - 5xSTS: 11sec standard chair no UE support  - 30sec STS: 13 repetitions standard chair no UE support, exertional SOB that recovers well, vitals WNL  GAIT: Distance walked: within clinic Assistive device utilized: None Level of assistance: Complete Independence Comments: antalgic gait R>L, reduced knee ROM throughout all phases of gait (B but more so on R). Mildly flexed trunk posture. Partial step through pattern leading w/ R. Reduced cadence and gait speed.    01/07/22: HR 70s, SpO2 98% w/ SOB in supine position that resolves with sitting position. VSS throughout remainder of session, no further symptoms.   TODAY'S TREATMENT:  [facility] Adult  PT Treatment:                                                 DATE: 03/09/22 Therapeutic Exercise: Seated adductor isos 2x15 cues for posture  Sidestepping along counter 5 laps cues for posture and foot positioning  Lateral step up 6inch step 2x6 cues for form and posture, hip mechanics, gentle UE support  Forward step ups 8inch 2x10 2inch heel taps x8 BIL Cybex hip extension 25# x8 each LE  37mn walking cool down moderate->light exertion Therapeutic Activity: STS lowest mat 2x10 no UE  Discussion re: stair navigation and strategies to improve comfort/safety, activity modification/pacing   [facility] Adult PT Treatment:                                                DATE: 03/05/22 Therapeutic Exercise: Seated adductor isos 2x15 cues for posture  Sidestepping along counter 5 laps cues for posture and foot positioning  Lateral step up 6inch step 2x6 cues for form and posture, hip mechanics, gentle UE support  Cybex hip extension 25# x8 each LE  534m walking cool down moderate->light exertion  Therapeutic Activity: STS lowest mat x10 no UE  5xSTS + education 30sec STS + vitals + education Discussion re: stair navigation and strategies to improve comfort/safety, activity modification/pacing    [facility] Adult PT Treatment:                                                DATE: 03/02/22 Therapeutic Exercise: 4 inch lateral step downs 2x8 B LE cues for form and hip positioning  Standing TKE ball at wall x15 B LE cues for form, posture, and alignment   Therapeutic Activity: 6inch fwd step up 2x8 B LE cues for form, weightshifting, and control 2inch fwd step down 2x8 each LE cues for form and control Mini squat 2x10 at counter cues for form and posture     PATIENT EDUCATION:  Education details: rationale for interventions, HEP review Person educated: Patient Education method: Explanation, Demonstration, Tactile cues, Verbal cues, and Handouts Education comprehension: verbalized understanding, returned demonstration, verbal cues required, tactile cues  required, and needs further education     HOME EXERCISE PROGRAM: Access Code: [email or web address] URL: [email or web address] Date: 02/26/2022 Prepared by: DaWoodlandith Counter Support  - 1 x daily - 7 x weekly - 2 sets - 12 reps - Mini Squat with Counter Support  - 1 x daily - 7 x weekly - 2 sets - 8 reps - Standing March with Counter Support  - 1 x daily - 7 x weekly - 2 sets - 10 reps   ASSESSMENT:   CLINICAL IMPRESSION: ***  03/09/2022 Pt arrives w/ 2-3/10 pain, states she continues to improve overall. Today looked at [place] performing 13 repetitions which is much improved compared to 3.5 repetitions at start of care and 6.5 repetitions at last assessment. Also looked at 5xSTS, pt performs in 11sec which is outside fall risk category (12-14sec in most populations). Exertional SOB w/ 30sec STS but recovers well  with rest and VSS. Pt tolerates additional exercises well without increase in pain or adverse events, cues as above, addition of walking cool down at end of session. Pt denies any pain on departure, states she feels "great". Pt departs today's session in no acute distress, all voiced questions/concerns addressed appropriately from PT perspective.    Eval - Patient is a 72 y.o. woman who was seen today for physical therapy evaluation and treatment for L knee pain and gait issues. Pt reports chronic B knee pain for several years, initially improved after R TKA and subsequent therapy but recently worsening. Pt reports some balance issues but denies falls. Reports variable symptoms but tend to be worst with repetitive weightbearing, most difficulty with sit to stand transfers. Pt reports previously being quite active, enjoys working with horses, but has had to modify activities due to pain. On exam pt demonstrates notable weakness B LE, R more affected than L, limited by weakness/pain. Gait impairments as above. 30sec sit to stand score indicative of  fall risk (<10sec for pt's cohort per [facility]). No adverse events. Significant time spent w/ pt education as above, subsequently will defer HEP establishment until next session. Pt departs today's session in no acute distress, all voiced questions/concerns addressed appropriately from PT perspective.     OBJECTIVE IMPAIRMENTS: Abnormal gait, decreased activity tolerance, decreased balance, decreased endurance, decreased mobility, difficulty walking, decreased ROM, decreased strength, improper body mechanics, and pain.    ACTIVITY LIMITATIONS: carrying, lifting, bending, sitting, standing, squatting, sleeping, stairs, transfers, and locomotion level   PARTICIPATION LIMITATIONS: cleaning, laundry, driving, and community activity   PERSONAL FACTORS: Age, Time since onset of injury/illness/exacerbation, and 3+ comorbidities: HTN, DM, RTKA May 2023, CAD/CHF, HTN  are also affecting patient's functional outcome.    REHAB POTENTIAL: Fair given chronicity and comorbidities   CLINICAL DECISION MAKING: Stable/uncomplicated   EVALUATION COMPLEXITY: Low     GOALS: Goals reviewed with patient? No   SHORT TERM GOALS: Target date: 01/27/2022   Pt will demonstrate appropriate understanding and performance of initially prescribed HEP in order to facilitate improved independence with management of symptoms.  Baseline: HEP provided on eval  Goal status: MET   2. Pt will score greater than or equal to 50 on FOTO in order to demonstrate improved perception of function due to symptoms.            Baseline: 47 02/09/22: 55%            Goal status: MET   LONG TERM GOALS: Target date: 03/12/22 (updated 02/09/22)    Pt will score 53 or greater on FOTO in order to demonstrate improved perception of function due to symptoms.  Baseline: 47 02/09/22: 55% Goal status: MET   2.  Pt will demonstrate 4+/5 knee flexion/extension MMT B in order to facilitate improved strength for transfers/WB. Baseline: see MMT chart  above Goal status: MET   3.  Pt will report ability to stand/walk for up to 57mn with less than 3/10 pain on NPS in order to improve tolerance for community ambulation and household tasks. Baseline: variable but up to 7/10 pain with above 02/09/22: remains variable, but generally 1-2 pt increase with ~341m Goal status: PARTIALLY MET   4.  Pt will be able to perform at least 7 repetitions for 30sec sit to stand test in order to indicate reduced fall risk and improved independence w/ transfers. (Per CDC cutoff score for fall risk <10 for women in pt age group) Baseline: 3.5 repetitions w/  knee pain 03/05/22: 13 repetitions  Goal status: MET    5. Pt will be able to ascend/descend 2 standard steps without rail safely w/ less than 3/10 pain on NPS in order to indicate improved safety w/ home entry Baseline: pain/difficulty with entering home 02/09/22: up to 4/10 pain with stairs Goal status: ONGOING    PLAN (updated 02/09/22):   PT FREQUENCY: 2x/week   PT DURATION: 4 weeks   PLANNED INTERVENTIONS: Therapeutic exercises, Therapeutic activity, Neuromuscular re-education, Balance training, Gait training, Patient/Family education, Self Care, Joint mobilization, Stair training, Aquatic Therapy, Dry Needling, Electrical stimulation, Cryotherapy, Moist heat, Taping, Manual therapy, and Re-evaluation   PLAN FOR NEXT SESSION: review/update HEP PRN. Work on IT trainer with emphasis on descent, eccentric knee control and hip strengthening  Margarette Canada, PTA 03/09/22 9:56 AM

## 2022-03-10 ENCOUNTER — Other Ambulatory Visit (HOSPITAL_BASED_OUTPATIENT_CLINIC_OR_DEPARTMENT_OTHER): Payer: Self-pay | Admitting: Family

## 2022-03-10 DIAGNOSIS — I5032 Chronic diastolic (congestive) heart failure: Secondary | ICD-10-CM

## 2022-03-10 DIAGNOSIS — I10 Essential (primary) hypertension: Secondary | ICD-10-CM

## 2022-03-10 NOTE — Telephone Encounter (Signed)
Rx request sent to pharmacy.  

## 2022-03-11 ENCOUNTER — Ambulatory Visit: Payer: Medicare Other | Admitting: Physical Therapy

## 2022-03-18 NOTE — Therapy (Signed)
OUTPATIENT PHYSICAL THERAPY DISCHARGE NOTE   Patient Name: [name] MRN: [id number] DOB:Apr 09, 1950, 72 y.o., female Today's Date: 03/19/2022    PHYSICAL THERAPY DISCHARGE SUMMARY  Visits from Start of Care: 17  Current functional level related to goals / functional outcomes: Able to perform activity w/o limitations from knee   Remaining deficits: Mild pain w/ descending stairs, activity tolerance deficits from comorbidities   Education / Equipment: HEP, monitoring symptoms, discharge education (see below for more in depth)   Patient agrees to discharge. Patient goals were met. Patient is being discharged due to meeting the stated rehab goals; pleased with current functional status.   PCP: Jilda Panda, MD   REFERRING PROVIDER: Bo Merino, MD  END OF SESSION:   PT End of Session - 03/19/22 [id number]     Visit Number 17    Number of Visits 19    Date for PT Re-Evaluation --    Authorization Type [employer] medicare    Progress Note Due on Visit --    PT Start Time 0929    PT Stop Time 1011    PT Time Calculation (min) 42 min    Activity Tolerance Patient tolerated treatment well;No increased pain    Behavior During Therapy WFL for tasks assessed/performed                  Past Medical History:  Diagnosis Date   Arthritis    Asthma    CAD (coronary artery disease)    CHF (congestive heart failure) (HCC)    Complication of anesthesia    trouble waking up once   COPD (chronic obstructive pulmonary disease) ([place])    Diabetes mellitus without complication ([place])    Dysrhythmia    Heart murmur    Hypertension    Hypothyroidism    Liver disease    NAFLD cirrhosis   Mild aortic stenosis    Pneumonia    Sarcoidosis    Sarcoidosis of lung ([place])    Sarcoidosis of lymph nodes    Past Surgical History:  Procedure Laterality Date   ABDOMINAL HYSTERECTOMY     ABDOMINAL SURGERY     CHOLECYSTECTOMY     ESOPHAGOGASTRODUODENOSCOPY (EGD) WITH PROPOFOL N/A  04/17/2021   Procedure: ESOPHAGOGASTRODUODENOSCOPY (EGD) WITH PROPOFOL;  Surgeon: Carol Ada, MD;  Location: [facility];  Service: Gastroenterology;  Laterality: N/A;   HERNIA REPAIR     LASIK  2001   LEFT HEART CATH AND CORONARY ANGIOGRAPHY N/A 09/23/2020   Procedure: LEFT HEART CATH AND CORONARY ANGIOGRAPHY;  Surgeon: Troy Sine, MD;  Location: [facility];  Service: Cardiovascular;  Laterality: N/A;   nissenfundiplication     TOTAL KNEE ARTHROPLASTY Right 05/19/2021   Procedure: TOTAL KNEE ARTHROPLASTY;  Surgeon: Paralee Cancel, MD;  Location: [facility];  Service: Orthopedics;  Laterality: Right;   Patient Active Problem List   Diagnosis Date Noted   Arthropathy of cervical facet joint [id number]   Diastolic dysfunction 99991111   Other cirrhosis of liver ([place]) 11/20/2021   S/P total knee arthroplasty, right [id number]   Systolic anterior movement of mitral valve 03/30/2021   Chest pain 09/23/2020   Thrombocytopenia ([place]) 09/23/2020   Dehydration 06/22/2012   AKI (acute kidney injury) ([place]) 06/22/2012   Hypothyroidism 06/22/2012   HTN (hypertension) 06/22/2012   Uncontrolled type 2 diabetes mellitus with hyperglycemia ([place]) 06/22/2012   Sarcoidosis 06/22/2012   Heart murmur 06/22/2012   UTI (urinary tract infection) 06/22/2012    REFERRING DIAG: [id number] (ICD-10-CM) - S/P  total knee arthroplasty, right M17.12 (ICD-10-CM) - Primary osteoarthritis of left knee R26.81 (ICD-10-CM) - Unstable gait  THERAPY DIAG:  Right knee pain, unspecified chronicity  Left knee pain, unspecified chronicity  Other abnormalities of gait and mobility  Muscle weakness (generalized)  Rationale for Evaluation and Treatment Rehabilitation  PERTINENT HISTORY: HTN, DM2, R TKA (may 2023), CAD, CHF, HTN   PRECAUTIONS: fall risk  SUBJECTIVE:                                                                                                                                                                                       SUBJECTIVE STATEMENT:   Pt arrives after ~2 week gap in care due to illness, states her knees are feeling pretty good and she has been doing exercises daily. Pt states she is continuing to improve functionally, still has some difficulty descending stairs but still improved. Feels confident discharging today   PAIN:  Are you having pain: 03/19/2022  2/10 B knees  Location: B knees L>R, anterior and medial How would you describe your pain? burning Best in past week: 0/10 Worst in past week: 4/10 (7/10 on eval) Aggravating factors: descending stairs Easing factors: HEP, rest, heating pad   OBJECTIVE: (objective measures completed at initial evaluation unless otherwise dated)   DIAGNOSTIC FINDINGS: none recently   PATIENT SURVEYS:  FOTO 47% initial, 53% predicted FOTO 02/09/22: 55%  FOTO 03/19/22: 62%    COGNITION: Overall cognitive status: Within functional limits for tasks assessed                         SENSATION: Grossly intact aside from numbness along medial/anterior R knee which pt states has been consistent since TKA; denies other sensory symptoms     PALPATION: RLE: TTP distal quad, gradually improving proximally LLE: TTP lateral/medial joint lines No patellar tenderness     LOWER EXTREMITY MMT:   MMT Right eval Left eval R/L 02/09/22 R/L 03/19/22  Hip flexion 4- 4 4+/4+ 5/5  Hip extension        Hip abduction modified sitting 4+ 4+ 5/5 5/5  Hip adduction        Hip internal rotation        Hip external rotation        Knee flexion 3+p! 4- 4+/4+ painless BIL  5/5 painless  Knee extension 4 4 4p! /4+ 5/5 painless   Ankle dorsiflexion        Ankle plantarflexion        Ankle inversion        Ankle eversion         (Blank rows =  not tested)       FUNCTIONAL TESTS:  30sec STS 3.5 repetitions from standard chair, heavy UE use, knee pain B 02/04/22: 30sec STS 6.5 repetitions from standard chair with no UE support, no R knee  pain, L knee pain during but no increase after  03/05/22:   - 5xSTS: 11sec standard chair no UE support  - 30sec STS: 13 repetitions standard chair no UE support, exertional SOB that recovers well, vitals WNL  03/19/22: 5xSTS 10 sec standard chair no UE support  OBSERVATION:  03/19/22: at end of today's session pt notes that she hurt her R toe (4th digit) a few days ago - on observation does have some swelling/bruising, she states it is significantly improved compared to initial injury. Sensation intact. Encouraged pt to see provider for assessment, gave education on strict monitoring, red flags, etc given medical hx    TODAY'S TREATMENT:                                                                                                [facility] Adult PT Treatment:                                                DATE: 03/19/22 Therapeutic Exercise: Mini squat x10 w UE support, HEP  Heel raises x12 HEP review Standing knee flexion AROM x10 B LE HEP review LAQ x10 B LE HEP review  Standing marches x10 B LE HEP review HEP review + handout  Therapeutic Activity: 5xSTS + education MSK + assessment  FOTO + education Stair assessment 2 laps - reciprocal pattern ascending w/ 1/10 pain, descending step to pattern 2-3/10 pain on NPS Discussion re: pacing of activities, monitoring symptoms, symptom/activity modification   PATIENT EDUCATION:  Education details: rationale for interventions, HEP review and safe performance, monitoring symptoms + discharge education Person educated: Patient Education method: Explanation, Demonstration, Tactile cues, Verbal cues, and Handouts Education comprehension: verbalized understanding, returned demonstration, verbal cues required, tactile cues required, and needs further education     HOME EXERCISE PROGRAM: Access Code: [email or web address] URL: [email or web address] Date: 03/19/2022 Prepared by: [place] with Counter Support  - 1 x  daily - 7 x weekly - 2 sets - 12 reps - Mini Squat with Counter Support  - 1 x daily - 7 x weekly - 2 sets - 8 reps - Standing March with Counter Support  - 1 x daily - 7 x weekly - 2 sets - 10 reps - Seated Long Arc Quad  - 1 x daily - 7 x weekly - 2 sets - 10 reps - Standing Knee Flexion with Unilateral Counter Support  - 1 x daily - 7 x weekly - 2 sets - 10 reps   ASSESSMENT:   CLINICAL IMPRESSION: 03/19/2022 Pt arrives w/ 2/10 pain after a short gap in care due to illness - states her knees have continued to do well and do not limit her daily activities, only  notable discomfort is with descending stairs but this too has improved. 5xSTS score improved to 10sec which is outside of fall risk category. MMT improved as above. Has met all LTG as addressed below and has surpassed predicted FOTO score. After discussion with pt she states she feels comfortable w/ discharging to independent HEP today, which was reviewed in depth and pt performs appropriately in clinic. Education as above. No adverse events. Pt departs today's session in no acute distress, all voiced questions/concerns addressed appropriately from PT perspective.      OBJECTIVE IMPAIRMENTS: Abnormal gait, decreased activity tolerance, decreased endurance, decreased mobility, improper body mechanics, and pain.    ACTIVITY LIMITATIONS: carrying, lifting, bending, sitting, standing, squatting, sleeping, stairs, transfers, and locomotion level   PARTICIPATION LIMITATIONS: cleaning, laundry, driving, and community activity   PERSONAL FACTORS: Age, Time since onset of injury/illness/exacerbation, and 3+ comorbidities: HTN, DM, RTKA May 2023, CAD/CHF, HTN  are also affecting patient's functional outcome.    REHAB POTENTIAL: Fair given chronicity and comorbidities   CLINICAL DECISION MAKING: Stable/uncomplicated   EVALUATION COMPLEXITY: Low     GOALS: Goals reviewed with patient? No   SHORT TERM GOALS: Target date: 01/27/2022   Pt will  demonstrate appropriate understanding and performance of initially prescribed HEP in order to facilitate improved independence with management of symptoms.  Baseline: HEP provided on eval  Goal status: MET   2. Pt will score greater than or equal to 50 on FOTO in order to demonstrate improved perception of function due to symptoms.            Baseline: 47 02/09/22: 55%            Goal status: MET   LONG TERM GOALS: Target date: 03/12/22 (updated 02/09/22)    Pt will score 53 or greater on FOTO in order to demonstrate improved perception of function due to symptoms.  Baseline: 47 02/09/22: 55% Goal status: MET   2.  Pt will demonstrate 4+/5 knee flexion/extension MMT B in order to facilitate improved strength for transfers/WB. Baseline: see MMT chart above Goal status: MET   3.  Pt will report ability to stand/walk for up to 52mn with less than 3/10 pain on NPS in order to improve tolerance for community ambulation and household tasks. Baseline: variable but up to 7/10 pain with above 02/09/22: remains variable, but generally 1-2 pt increase with ~371m 03/19/22: reports standing without limitation, states walking is more limited by exertional fatigue than pain Goal status: MET   4.  Pt will be able to perform at least 7 repetitions for 30sec sit to stand test in order to indicate reduced fall risk and improved independence w/ transfers. (Per CDC cutoff score for fall risk <10 for women in pt age group) Baseline: 3.5 repetitions w/ knee pain 03/05/22: 13 repetitions  Goal status: MET    5. Pt will be able to ascend/descend 2 standard steps without rail safely w/ less than 3/10 pain on NPS in order to indicate improved safety w/ home entry Baseline: pain/difficulty with entering home 02/09/22: up to 4/10 pain with stairs 03/19/22: able to ascend 3 standard stairs, no rail 1/10 pain; descend 3 standard stairs, no rail 2-3/10 transient pain Goal status: MET    PLAN: DISCHARGE    PT  FREQUENCY: NA   PT DURATION: NA   PLANNED INTERVENTIONS: Therapeutic exercises, Therapeutic activity, Neuromuscular re-education, Balance training, Gait training, Patient/Family education, Self Care, Joint mobilization, Stair training, Aquatic Therapy, Dry Needling, Electrical stimulation, Cryotherapy, Moist  heat, Taping, Manual therapy, and Re-evaluation   PLAN FOR NEXT SESSION: NA  Leeroy Cha PT, DPT 03/19/2022 2:27 PM

## 2022-03-19 ENCOUNTER — Ambulatory Visit: Payer: Medicare Other | Attending: Cardiology | Admitting: Physical Therapy

## 2022-03-19 ENCOUNTER — Encounter: Payer: Self-pay | Admitting: Physical Therapy

## 2022-03-19 DIAGNOSIS — R2689 Other abnormalities of gait and mobility: Secondary | ICD-10-CM | POA: Insufficient documentation

## 2022-03-19 DIAGNOSIS — M25561 Pain in right knee: Secondary | ICD-10-CM | POA: Diagnosis present

## 2022-03-19 DIAGNOSIS — M6281 Muscle weakness (generalized): Secondary | ICD-10-CM | POA: Insufficient documentation

## 2022-03-19 DIAGNOSIS — M25562 Pain in left knee: Secondary | ICD-10-CM | POA: Insufficient documentation

## 2022-03-22 ENCOUNTER — Telehealth: Payer: Self-pay

## 2022-03-22 NOTE — Telephone Encounter (Signed)
ERROR

## 2022-04-08 ENCOUNTER — Ambulatory Visit: Payer: Medicare Other | Admitting: Internal Medicine

## 2022-04-08 ENCOUNTER — Encounter: Payer: Self-pay | Admitting: Internal Medicine

## 2022-04-08 VITALS — BP 134/72 | HR 80 | Temp 97.7°F | Ht 62.5 in | Wt 178.0 lb

## 2022-04-08 DIAGNOSIS — J4540 Moderate persistent asthma, uncomplicated: Secondary | ICD-10-CM | POA: Diagnosis not present

## 2022-04-08 DIAGNOSIS — D860 Sarcoidosis of lung: Secondary | ICD-10-CM | POA: Diagnosis not present

## 2022-04-08 MED ORDER — ALBUTEROL SULFATE HFA 108 (90 BASE) MCG/ACT IN AERS: 2.0000 | INHALATION_SPRAY | Freq: Four times a day (QID) | RESPIRATORY_TRACT | 5 refills | Status: AC | PRN

## 2022-04-08 NOTE — Progress Notes (Signed)
[name]    [id number]    Jan 03, 1951  Primary Care Physician:Moreira, Carloyn Manner, MD Date of Appointment: 04/08/2022 Established Patient Visit  Chief complaint:   Chief Complaint  Patient presents with   Follow-up    Lung mass found on xray. Pt following up. Pt has been experiencing coughing, chest tightness, and Doe      HPI: [name] is a 72 y.o. 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 She also has cirrhosis and sees Dr. Collene Mares.   Interval Updates: Here for follow up after over a year. Was seen in the urgent care earlier this month with influenza B and was told she had an abnormal chest xray and  needs to follow up with [place]. Has had dyspnea on exertion progressing over the past year.   She does have an albuterol rescue inhaler which she uses once every couple weeks.   Has some cough which has been present since she had flu B.  No nephrolithiasis. No skin or eye changes.   I have reviewed the patient's family social and past medical history and updated as appropriate.   Past Medical History:  Diagnosis Date   Arthritis    Asthma    CAD (coronary artery disease)    CHF (congestive heart failure) (HCC)    Complication of anesthesia    trouble waking up once   COPD (chronic obstructive pulmonary disease) ([place])    Diabetes mellitus without complication ([place])    Dysrhythmia    Heart murmur    Hypertension    Hypothyroidism    Liver disease    NAFLD cirrhosis   Mild aortic stenosis    Pneumonia    Sarcoidosis    Sarcoidosis of lung ([place])    Sarcoidosis of lymph nodes     Past Surgical History:  Procedure Laterality Date   ABDOMINAL HYSTERECTOMY     ABDOMINAL SURGERY     CHOLECYSTECTOMY     ESOPHAGOGASTRODUODENOSCOPY (EGD) WITH PROPOFOL N/A 04/17/2021   Procedure: ESOPHAGOGASTRODUODENOSCOPY (EGD) WITH PROPOFOL;  Surgeon: Carol Ada, MD;  [facility]: [facility];  Service: Gastroenterology;  Laterality: N/A;    HERNIA REPAIR     LASIK  2001   LEFT HEART CATH AND CORONARY ANGIOGRAPHY N/A 09/23/2020   Procedure: LEFT HEART CATH AND CORONARY ANGIOGRAPHY;  Surgeon: Troy Sine, MD;  Location: [facility];  Service: Cardiovascular;  Laterality: N/A;   nissenfundiplication     TOTAL KNEE ARTHROPLASTY Right 05/19/2021   Procedure: TOTAL KNEE ARTHROPLASTY;  Surgeon: Paralee Cancel, MD;  Location: [facility];  Service: Orthopedics;  Laterality: Right;    Family History  Problem Relation Age of Onset   Diabetes Mother    Heart attack Mother    Heart failure Father    Hypertension Father    Heart attack Father    Heart Problems Sister    Blindness Sister    Diabetes Sister    Hypertension Sister    Heart attack Brother    Heart attack Brother    Throat cancer Brother    Healthy Son    Rheum arthritis Daughter     Social History   Occupational History   Not on file  Tobacco Use   Smoking status: Never    Passive exposure: Past (minimal)   Smokeless tobacco: Never  Vaping Use   Vaping Use: Never used  Substance and Sexual Activity   Alcohol use: No   Drug  use: No   Sexual activity: Not Currently     Physical Exam: Blood pressure 134/72, pulse 80, temperature 97.7 F (36.5 C), temperature source Oral, height 5' 2.5" (1.588 m), weight 178 lb (80.7 kg), SpO2 97 %.  Gen:      No acute distress ENT:  no nasal polyps, mucus membranes moist Lungs:    No increased respiratory effort, symmetric chest wall excursion, clear to auscultation bilaterally, no wheezes or crackles CV:         Regular rate and rhythm; no murmurs, rubs, or gallops.  No pedal edema   Data Reviewed: Imaging: I have personally reviewed the CT Chest Feb 2022 which shows mild peribronchial thickening and bronchiectasis changes, no focal consolidation, mild ggos which are improving from previous study.  CTPe study Sept 2023 - negative for PE no acute pulmonary process  EKG reviewed 09/2021 no IV conduction delay,  NSR  PFTs:      No data to display         I have personally reviewed the patient's PFTs   Labs: ANA 1:1280 with nuclear homogenous pattern ACE 100 Paraesophageal lymph node with non-necrotizing granulomas. Done in 2010  Immunization status: Immunization History  Administered Date(s) Administered   Fluad Quad(high Dose 65+) 11/23/2020    External Records Personally Reviewed: ED, pulmonary , PCP  Assessment:  Pulmonary Sarcoidosis Allergic rhinitis   Plan/Recommendations:  Full PFT to evaluate for disease activity. She is  hesitant to go on prednisone unless absolutely needed due to averse effects.  Continue prn albuterol for now.  Continue cetirizine for seasonal allergic rhinitis  I spent 30 minutes on 04/08/2022 in care of this patient including face to face time and non-face to face time spent charting, review of outside records, and coordination of care.  Return to Care: Return in about 2 months (around 06/08/2022).   Lenice Llamas, MD Pulmonary and [place]

## 2022-04-08 NOTE — Patient Instructions (Addendum)
Please schedule follow up scheduled with myself in 2 months.  If my schedule is not open yet, we will contact you with a reminder closer to that time. Please call [phone] if you haven't heard from [place] a month before.   Before your next visit I would like you to have:  Full set of PFTs- 1 hr.   Take the albuterol rescue inhaler every 4 to 6 hours as needed for wheezing or shortness of breath. You can also take it 15 minutes before exercise or exertional activity. Side effects include heart racing or pounding, jitters or anxiety. If you have a history of an irregular heart rhythm, it can make this worse. Can also give some patients a hard time sleeping.  Let's get the breathing testing and let you recover from the flu. And then we can reassess if we need to do anything further to treat your sarcoidosis. The treatment is usually steroids likely prednisone which has a lot of side effects so we want to be absolutely sure you need it before starting.   For Pain You can take tylenol up to 2g per day. (That is 5 extra strength tablets) You can take ibuprofen 600 mg up to 3 times a day with food but only for 2-3 days in a row when things flare.

## 2022-04-13 ENCOUNTER — Emergency Department (HOSPITAL_COMMUNITY): Payer: Medicare Other

## 2022-04-13 ENCOUNTER — Other Ambulatory Visit: Payer: Self-pay

## 2022-04-13 ENCOUNTER — Emergency Department (HOSPITAL_COMMUNITY)
Admission: EM | Admit: 2022-04-13 | Discharge: 2022-04-13 | Disposition: A | Discharge status: Home / Self Care | Payer: Medicare Other | Attending: Emergency Medicine | Admitting: Emergency Medicine

## 2022-04-13 ENCOUNTER — Encounter (HOSPITAL_COMMUNITY): Payer: Self-pay

## 2022-04-13 DIAGNOSIS — R0602 Shortness of breath: Secondary | ICD-10-CM | POA: Diagnosis not present

## 2022-04-13 DIAGNOSIS — Z1152 Encounter for screening for COVID-19: Secondary | ICD-10-CM | POA: Insufficient documentation

## 2022-04-13 DIAGNOSIS — R059 Cough, unspecified: Secondary | ICD-10-CM | POA: Diagnosis present

## 2022-04-13 DIAGNOSIS — R160 Hepatomegaly, not elsewhere classified: Secondary | ICD-10-CM | POA: Diagnosis not present

## 2022-04-13 DIAGNOSIS — J40 Bronchitis, not specified as acute or chronic: Secondary | ICD-10-CM | POA: Diagnosis not present

## 2022-04-13 DIAGNOSIS — R042 Hemoptysis: Secondary | ICD-10-CM

## 2022-04-13 LAB — CBC WITH DIFFERENTIAL/PLATELET
Abs Immature Granulocytes: 0.01 10*3/uL (ref 0.00–0.07)
Basophils Absolute: 0 10*3/uL (ref 0.0–0.1)
Basophils Relative: 1 %
Eosinophils Absolute: 0.1 10*3/uL (ref 0.0–0.5)
Eosinophils Relative: 5 %
HCT: 36.7 % (ref 36.0–46.0)
Hemoglobin: 12 g/dL (ref 12.0–15.0)
Immature Granulocytes: 0 %
Lymphocytes Relative: 21 %
Lymphs Abs: 0.6 10*3/uL — ABNORMAL LOW (ref 0.7–4.0)
MCH: 27.9 pg (ref 26.0–34.0)
MCHC: 32.7 g/dL (ref 30.0–36.0)
MCV: 85.3 fL (ref 80.0–100.0)
Monocytes Absolute: 0.2 10*3/uL (ref 0.1–1.0)
Monocytes Relative: 8 %
Neutro Abs: 1.9 10*3/uL (ref 1.7–7.7)
Neutrophils Relative %: 65 %
Platelets: 78 10*3/uL — ABNORMAL LOW (ref 150–400)
RBC: 4.3 MIL/uL (ref 3.87–5.11)
RDW: 14.5 % (ref 11.5–15.5)
WBC: 2.9 10*3/uL — ABNORMAL LOW (ref 4.0–10.5)
nRBC: 0 % (ref 0.0–0.2)

## 2022-04-13 LAB — COMPREHENSIVE METABOLIC PANEL
ALT: 44 U/L (ref 0–44)
AST: 54 U/L — ABNORMAL HIGH (ref 15–41)
Albumin: 3.8 g/dL (ref 3.5–5.0)
Alkaline Phosphatase: 105 U/L (ref 38–126)
Anion gap: 7 (ref 5–15)
BUN: 12 mg/dL (ref 8–23)
CO2: 24 mmol/L (ref 22–32)
Calcium: 9.1 mg/dL (ref 8.9–10.3)
Chloride: 107 mmol/L (ref 98–111)
Creatinine, Ser: 0.63 mg/dL (ref 0.44–1.00)
GFR, Estimated: >60 mL/min (ref >60)
Glucose, Bld: 110 mg/dL — ABNORMAL HIGH (ref 70–99)
Potassium: 3.5 mmol/L (ref 3.5–5.1)
Sodium: 138 mmol/L (ref 135–145)
Total Bilirubin: 0.8 mg/dL (ref 0.3–1.2)
Total Protein: 7.7 g/dL (ref 6.5–8.1)

## 2022-04-13 LAB — TYPE AND SCREEN
ABO/RH(D): O NEG
Antibody Screen: NEGATIVE

## 2022-04-13 LAB — BRAIN NATRIURETIC PEPTIDE: B Natriuretic Peptide: 55.1 pg/mL (ref 0.0–100.0)

## 2022-04-13 LAB — PROTIME-INR
INR: 1.1 (ref 0.8–1.2)
Prothrombin Time: 14.3 seconds (ref 11.4–15.2)

## 2022-04-13 LAB — SARS CORONAVIRUS 2 BY RT PCR: SARS Coronavirus 2 by RT PCR: NEGATIVE

## 2022-04-13 MED ORDER — IOHEXOL 300 MG/ML  SOLN
75.0000 mL | Freq: Once | INTRAMUSCULAR | Status: AC | PRN
  Administered 2022-04-13: 75 mL via INTRAVENOUS

## 2022-04-13 MED ORDER — PREDNISONE 20 MG PO TABS: 20.0000 mg | ORAL_TABLET | Freq: Every day | ORAL | 0 refills | Status: DC

## 2022-04-13 MED ORDER — AZITHROMYCIN 250 MG PO TABS: 250.0000 mg | ORAL_TABLET | Freq: Every day | ORAL | 0 refills | Status: DC

## 2022-04-13 NOTE — ED Provider Notes (Signed)
[place] EMERGENCY DEPARTMENT AT [facility] Provider Note   CSN: [id number] Arrival date & time: 04/13/22  [id number]     History  Chief Complaint  Patient presents with   Hemoptysis    [name] is a 72 y.o. female.  She has a history of pulmonary sarcoidosis and follows with Dr. Shearon Stalls pulmonology.  She is only using an inhaler and is not on any immune suppressing or steroids.  She has some baseline shortness of breath.  She said she caught influenza back February and has been having intermittent cough since then.  She woke up around 5 AM this morning with harsher cough and she noticed blood in it along with blood on her pillow.  She said initially was dark red and is now lighter red.  She does not feel more short of breath there is no chest pain no vomiting or diarrhea.  No fevers weakness dizziness.  She has never had this before.  She is a non-smoker.  Not on any blood thinners other than an aspirin  The history is provided by the patient.  Cough Cough characteristics:  Productive Sputum characteristics:  Bloody Onset quality:  Sudden Progression:  Resolved Chronicity:  New Smoker: no   Relieved by:  None tried Worsened by:  Nothing Ineffective treatments:  None tried Associated symptoms: shortness of breath   Associated symptoms: no chest pain and no fever   Risk factors: recent infection        Home Medications Prior to Admission medications   Medication Sig Start Date End Date Taking? Authorizing Provider  acyclovir (ZOVIRAX) 400 MG tablet Take 400 mg by mouth every 8 (eight) hours as needed. Fever blisters 05/19/20   [provider]  albuterol (VENTOLIN HFA) 108 (90 Base) MCG/ACT inhaler Inhale 2 puffs into the lungs every 6 (six) hours as needed. 04/08/22   Spero Geralds, MD  amLODipine (NORVASC) 10 MG tablet Take 1 tablet (10 mg total) by mouth daily. 11/04/21 10/30/22  Buford Dresser, MD  calcium-vitamin D (OSCAL WITH D) 500-200 MG-UNIT  tablet Take 1 tablet by mouth daily with breakfast.    [provider]  carvedilol (COREG) 25 MG tablet Take 1 tablet (25 mg total) by mouth 2 (two) times daily with a meal. 03/30/21   Buford Dresser, MD  cetirizine (ZYRTEC) 10 MG tablet Take 10 mg by mouth daily.    [provider]  docusate sodium (COLACE) 100 MG capsule Take 1 capsule (100 mg total) by mouth 2 (two) times daily. 05/20/21   Irving Copas, PA-C  furosemide (LASIX) 20 MG tablet Take 20-40 mg by mouth See admin instructions. Taking 20 mg every Sun., Tues., Thurs., and Sat. Taking 40 mg all other days.    [provider]  gabapentin (NEURONTIN) 300 MG capsule Take 300 mg by mouth as needed. 09/02/20   [provider]  hydrALAZINE (APRESOLINE) 25 MG tablet Take 1 tablet by mouth as needed.    [provider]  irbesartan (AVAPRO) 150 MG tablet Take 1 tablet by mouth once daily 03/10/22   Buford Dresser, MD  levothyroxine (SYNTHROID, LEVOTHROID) 150 MCG tablet Take 75-150 mcg by mouth See admin instructions. Pt alternates between 150 mcg and 75 mcg each day with before breakfast    [provider]  metFORMIN (GLUCOPHAGE-XR) 500 MG 24 hr tablet Take 2 tablets (1,000 mg total) by mouth 2 (two) times daily. Patient taking differently: Take 1,000 mg by mouth daily with breakfast. 09/25/20  Charlynne Cousins, MD  rosuvastatin (CRESTOR) 10 MG tablet Take 5 mg by mouth daily.    [provider]  Semaglutide, 1 MG/DOSE, (OZEMPIC, 1 MG/DOSE,) 4 MG/3ML SOPN Inject 1 mg into the skin once a week. Pt take sometimes    [provider]  spironolactone (ALDACTONE) 25 MG tablet Take 25 mg by mouth daily. 11/22/21   [provider]  zolpidem (AMBIEN) 10 MG tablet Take 10 mg by mouth at bedtime.    [provider]      Allergies    Sulfa antibiotics, Atorvastatin, and Amoxicillin    Review of Systems   Review of Systems  Constitutional:  Negative  for fever.  Respiratory:  Positive for cough and shortness of breath.   Cardiovascular:  Negative for chest pain.  Gastrointestinal:  Negative for abdominal pain, nausea and vomiting.    Physical Exam Updated Vital Signs BP (!) 123/95 (BP Location: Left Arm)   Pulse 74   Temp 97.6 F (36.4 C) (Oral)   Resp 18   SpO2 100%  Physical Exam Vitals and nursing note reviewed.  Constitutional:      General: She is not in acute distress.    Appearance: Normal appearance. She is well-developed.  HENT:     Head: Normocephalic and atraumatic.  Eyes:     Conjunctiva/sclera: Conjunctivae normal.  Cardiovascular:     Rate and Rhythm: Normal rate and regular rhythm.     Heart sounds: No murmur heard. Pulmonary:     Effort: Pulmonary effort is normal. No respiratory distress.     Breath sounds: Normal breath sounds.  Abdominal:     Palpations: Abdomen is soft.     Tenderness: There is no abdominal tenderness. There is no guarding or rebound.  Musculoskeletal:        General: Normal range of motion.     Cervical back: Neck supple.     Right lower leg: No edema.     Left lower leg: No edema.  Skin:    General: Skin is warm and dry.     Capillary Refill: Capillary refill takes less than 2 seconds.  Neurological:     General: No focal deficit present.     Mental Status: She is alert.     ED Results / Procedures / Treatments   Labs (all labs ordered are listed, but only abnormal results are displayed) Labs Reviewed  COMPREHENSIVE METABOLIC PANEL - Abnormal; Notable for the following components:      Result Value   Glucose, Bld 110 (*)    AST 54 (*)    All other components within normal limits  CBC WITH DIFFERENTIAL/PLATELET - Abnormal; Notable for the following components:   WBC 2.9 (*)    Platelets 78 (*)    Lymphs Abs 0.6 (*)    All other components within normal limits  SARS CORONAVIRUS 2 BY RT PCR  PROTIME-INR  BRAIN NATRIURETIC PEPTIDE  ANCA TITERS  TYPE AND SCREEN   ABO/RH    EKG EKG Interpretation  Date/Time:  Tuesday April 13 2022 09:55:46 EDT Ventricular Rate:  64 PR Interval:  205 QRS Duration: 97 QT Interval:  463 QTC Calculation: 478 R Axis:   12 Text Interpretation: Sinus rhythm Low voltage, precordial leads Nonspecific T abnrm, anterolateral leads No significant change since prior 9/23 Confirmed by Aletta Edouard [phone]) on 04/13/2022 10:06:18 AM  Radiology CT Chest W Contrast  Result Date: 04/13/2022 CLINICAL DATA:  Hemoptysis. Coughing up blood clots since this morning.  Patient reports recent lymph node biopsy showing sarcoidosis. EXAM: CT CHEST WITH CONTRAST TECHNIQUE: Multidetector CT imaging of the chest was performed during intravenous contrast administration. RADIATION DOSE REDUCTION: This exam was performed according to the departmental dose-optimization program which includes automated exposure control, adjustment of the mA and/or kV according to patient size and/or use of iterative reconstruction technique. CONTRAST:  29mL OMNIPAQUE IOHEXOL 300 MG/ML  SOLN COMPARISON:  Radiographs 04/13/2022. Chest CTA 09/25/2021 and CT 03/06/2021. FINDINGS: Cardiovascular: No acute vascular findings are seen. There is atherosclerosis of the aorta, great vessels and coronary arteries. The heart size is normal. There is no pericardial effusion. Mediastinum/Nodes: Again demonstrated are multiple mildly enlarged mediastinal and hilar lymph nodes, similar to previous studies. Representative nodes include a right paratracheal node measuring 1.8 cm short axis on image 51/2 and a subcarinal node measuring 1.5 cm on image 58/2. No axillary adenopathy. There is a stable small hiatal hernia. Diffuse enlargement of the thyroid gland is again noted (previously evaluated by ultrasound 12/20/2013; no follow-up imaging recommended). Lungs/Pleura: No pleural effusion or pneumothorax. Interval improved aeration of both lung bases. 4 mm right middle lobe nodule on image 62/7  is unchanged from CT 11/27/2020 (best seen on the sagittal images), consistent with a benign finding. No edema, confluent airspace opacity or suspicious pulmonary nodule. Upper abdomen: Morphologic changes of diffuse hepatic cirrhosis are again noted with contour irregularity of the liver and relative enlargement of the caudate and left lobes. There is a mildly heterogeneous enhancing lesion centrally in the right hepatic lobe which measures up to 2.2 x 2.0 cm on image 105/2 and 2.6 cm on coronal image 55/5. In the setting of cirrhosis, this enlarging lesion is concerning for hepatocellular carcinoma. Further evaluation recommended. Small lymph nodes in the porta hepatis are grossly stable. There is stable mild extrahepatic biliary dilatation status post cholecystectomy. Stable mild splenomegaly. Musculoskeletal/Chest wall: There is no chest wall mass or suspicious osseous finding. IMPRESSION: 1. No acute chest findings or explanation for hemoptysis identified. 2. Stable mediastinal and hilar adenopathy, consistent with known sarcoidosis. 3. Hepatic cirrhosis with enlarging heterogeneous enhancing lesion centrally in the right hepatic lobe, suspicious for hepatocellular carcinoma. Recommend further evaluation with abdominal MRI without and with contrast. 4. Stable mild extrahepatic biliary dilatation status post cholecystectomy. 5.  Aortic Atherosclerosis (ICD10-I70.0). Electronically Signed   By: Richardean Sale M.D.   On: 04/13/2022 11:38   DG Chest Port 1 View  Result Date: 04/13/2022 CLINICAL DATA:  Hemoptysis EXAM: PORTABLE CHEST 1 VIEW COMPARISON:  Previous studies including the examination done on 09/25/2021 FINDINGS: Transverse diameter of heart is increased. There are no signs of pulmonary edema or focal pulmonary consolidation. There is no pleural effusion or pneumothorax. There is soft tissue fullness in superior mediastinum with extrinsic pressure over the trachea, most likely due to enlarged  thyroid. IMPRESSION: There are no new infiltrates or signs of pulmonary edema. Electronically Signed   By: Elmer Picker M.D.   On: 04/13/2022 10:17    Procedures Procedures    Medications Ordered in ED Medications  iohexol (OMNIPAQUE) 300 MG/ML solution 75 mL (75 mLs Intravenous Contrast Given 04/13/22 1112)    ED Course/ Medical Decision Making/ A&P Clinical Course as of 04/13/22 1710  Tue Apr 13, 2022  1003 Discussed with pulmonary Dr. Erin Fulling.  He is recommending CT chest with contrast and they will consult on patient. [MB]  1004 Chest x-ray interpreted by me as no gross infiltrates.  Does have some mediastinal lymphadenopathy unchanged from  [facility]. [MB]  1217 Patient was seen by pulmonary Dr. Erin Fulling.  He said she does not need bronchoscopy or admission at this time.  Is recommending prednisone 20 mg daily for 7 days and a Z-Pak.  He will also update Dr. Shearon Stalls pulmonology along with Dr. Almyra Free and Dr. Lorenso Courier her GI oncology team. [MB]    Clinical Course User Index [MB] Hayden Rasmussen, MD                             Medical Decision Making Amount and/or Complexity of Data Reviewed Labs: ordered. Radiology: ordered.  Risk Prescription drug management.   This patient complains of cough with hemoptysis; this involves an extensive number of treatment Options and is a complaint that carries with it a high risk of complications and morbidity. The differential includes bronchitis, pneumonia, mass, TB, bronchiectasis  I ordered, reviewed and interpreted labs, which included CBC with low white count new from [facility], low platelets similar to prior, stable hemoglobin, chemistries and LFTs fairly unremarkable, INR normal, BMP normal I ordered imaging studies which included chest x-ray and CT chest with contrast and I independently    visualized and interpreted imaging which showed no definite source of bleeding.  Does have stable adenopathy consistent with sarcoidosis and a larger  than prior hepatic mass possibly hepatocellular carcinoma Additional history obtained from patient's husband Previous records obtained and reviewed in epic including recent pulmonary notes I consulted pulmonary Dr. Erin Fulling and discussed lab and imaging findings and discussed disposition.  Cardiac monitoring reviewed, normal sinus rhythm Social determinants considered, no significant barriers Critical Interventions: None  After the interventions stated above, I reevaluated the patient and found patient to be resting comfortably no distress satting 97% on room air.  Has had no further hemoptysis episodes here. Admission and further testing considered, no indications for admission or bronchoscopy at this time.  Recommended close outpatient follow-up.  She will also need to follow-up with her GI and oncology team regarding other findings on CT.  Patient is comfortable plan.  Per pulmonary recommendations will cover with antibiotics and steroids.  Return instructions discussed         Final Clinical Impression(s) / ED Diagnoses Final diagnoses:  Hemoptysis  Bronchitis  Liver mass    Rx / DC Orders ED Discharge Orders          Ordered    predniSONE (DELTASONE) 20 MG tablet  Daily        04/13/22 1219    azithromycin (ZITHROMAX) 250 MG tablet  Daily        04/13/22 1219              Hayden Rasmussen, MD 04/13/22 [phone]

## 2022-04-13 NOTE — ED Triage Notes (Signed)
C/o coughing with blood clots in sputum that started at 0500.  Denies sob.  Pt reports recent mass found in lungs with lymph node biopsy that showed sarcoidosis.

## 2022-04-13 NOTE — Discharge Instructions (Signed)
You were seen in the emergency department for cough with bloody sputum.  Your lab work was fairly unremarkable.  CAT scan of your chest did not show any definite site of bleeding.  The CAT scan did show an enlarging mass in your liver and this will need follow-up with your GI and oncology team.  Pulmonology is recommending antibiotics and a short course of steroids.  Continue your other regular medications.  Return to the emergency department if any continued blood in your sputum or other concerning symptoms

## 2022-04-13 NOTE — Consult Note (Signed)
NAME:  [name], MRN:  [id number], DOB:  Feb 17, 1950, LOS: 0 ADMISSION DATE:  04/13/2022, CONSULTATION DATE:  04/13/22 REFERRING MD:  Melina Copa, MD CHIEF COMPLAINT:  Hemoptysis   History of Present Illness:  [name] is a 72 year old woman with history of sarcoidosis, liver cirrhosis and thrombocytopenia who presented to the [facility] ER with 1 day of hemoptysis. She reports coughing up dark blood this moring 2-3 episodes in total. The blood turned a brighter red towards the last episode. She reports coughing up a quarter to half a cup of blood in total. Denies epistaxis, nausea, vomiting or diarrhea. She denies any dark or tarry stools recently. Denies fevers, chills or sweats. No fatigue or weight loss.   ER work up showed normal chemistry panel with AST 54. BNP 55. Hemoglobin 12 and platelets 78. WBC count was 2.9. INR 1.1 and PT 14.3.   CT Chest with contrast showed no opacities, infiltrates or airway abnormalities. 56mm RML nodule unchanged since 2022. Mildly heterogeneous enhancing lesion in right hepatic lobe measuring 2.2 x 2.0, recommend hepatic MRI.   Pertinent  Medical History   Sarcoidosis Hypertension DM II Cirrhosis, NASH  Significant Hospital Events: Including procedures, antibiotic start and stop dates in addition to other pertinent events   3/26 presented to ER for hemoptysis  Interim History / Subjective:  As above  Objective   Blood pressure (!) 123/95, pulse 74, temperature 97.6 F (36.4 C), temperature source Oral, resp. rate 18, SpO2 100 %.       No intake or output data in the 24 hours ending 04/13/22 1220 There were no vitals filed for this visit.  Examination: General: elderly woman, no acute distress, resting in bed HENT: [place]/AT, moist mucous membranes, no lesions or nose or mouth or pharynx noted Lungs: rales left base, no wheezing Cardiovascular: rrr, no murmurs Abdomen: soft, non-tender, non-distended, BS+ Extremities: warm, no edema Neuro: alert,  oriented, moving all extremities GU: n/a  Resolved Hospital Problem list     Assessment & Plan:  Hemoptysis Hx of sarcoidosis Hx of Cirrhosis New heterogeneous 2cm liver lesion Thrombocytopenia  Discussion No clear etiology to her hemoptysis at this time. CT Chest with contrast does not show any abnormal lesions or infiltrates. Her platelets are within her normal range and her INR is normal. She does not appear to have GI bleeding and no evidence of epistaxis or upper airway bleeding.   Plan: - will check ANCA screen - Will treat for acute bronchitis or respiratory infection with prednisone 20mg  daily for 7 days and Azithromycin for 5 days - Will notify her Hematologist/Oncologist and Dr. Collene Mares regarding the new 2cm liver lesion to arrange MRI follow up - Will notify Dr. Shearon Stalls her primary pulmonologist of her ER visit.  Labs   CBC: Recent Labs  Lab 04/13/22 0948  WBC 2.9*  NEUTROABS 1.9  HGB 12.0  HCT 36.7  MCV 85.3  PLT 78*    Basic Metabolic Panel: Recent Labs  Lab 04/13/22 0948  NA 138  K 3.5  CL 107  CO2 24  GLUCOSE 110*  BUN 12  CREATININE 0.63  CALCIUM 9.1   GFR: Estimated Creatinine Clearance: 62.4 mL/min (by C-G formula based on SCr of 0.63 mg/dL). Recent Labs  Lab 04/13/22 0948  WBC 2.9*    Liver Function Tests: Recent Labs  Lab 04/13/22 0948  AST 54*  ALT 44  ALKPHOS 105  BILITOT 0.8  PROT 7.7  ALBUMIN 3.8   No results  for input(s): "LIPASE", "AMYLASE" in the last 168 hours. No results for input(s): "AMMONIA" in the last 168 hours.  ABG    Component Value Date/Time   TCO2 25 06/07/2015 0829     Coagulation Profile: Recent Labs  Lab 04/13/22 0948  INR 1.1    Cardiac Enzymes: No results for input(s): "CKTOTAL", "CKMB", "CKMBINDEX", "TROPONINI" in the last 168 hours.  HbA1C: Hgb A1c MFr Bld  Date/Time Value Ref Range Status  05/19/2021 01:56 PM 5.5 4.8 - 5.6 % Final    Comment:    (NOTE) Pre diabetes:           5.7%-6.4%  Diabetes:              >6.4%  Glycemic control for   <7.0% adults with diabetes   09/23/2020 02:10 AM 5.8 (H) 4.8 - 5.6 % Final    Comment:    (NOTE) Pre diabetes:          5.7%-6.4%  Diabetes:              >6.4%  Glycemic control for   <7.0% adults with diabetes     CBG: No results for input(s): "GLUCAP" in the last 168 hours.  Review of Systems:   A 10 point review of systems is otherwise negative unless stated above.   Past Medical History:  She,  has a past medical history of Arthritis, Asthma, CAD (coronary artery disease), CHF (congestive heart failure) ([place]), Complication of anesthesia, COPD (chronic obstructive pulmonary disease) ([place]), Diabetes mellitus without complication ([place]), Dysrhythmia, Heart murmur, Hypertension, Hypothyroidism, Liver disease, Mild aortic stenosis, Pneumonia, Sarcoidosis, Sarcoidosis of lung ([place]), and Sarcoidosis of lymph nodes.   Surgical History:   Past Surgical History:  Procedure Laterality Date   ABDOMINAL HYSTERECTOMY     ABDOMINAL SURGERY     CHOLECYSTECTOMY     ESOPHAGOGASTRODUODENOSCOPY (EGD) WITH PROPOFOL N/A 04/17/2021   Procedure: ESOPHAGOGASTRODUODENOSCOPY (EGD) WITH PROPOFOL;  Surgeon: Carol Ada, MD;  Location: [facility];  Service: Gastroenterology;  Laterality: N/A;   HERNIA REPAIR     LASIK  2001   LEFT HEART CATH AND CORONARY ANGIOGRAPHY N/A 09/23/2020   Procedure: LEFT HEART CATH AND CORONARY ANGIOGRAPHY;  Surgeon: Troy Sine, MD;  Location: [place] [facility];  Service: Cardiovascular;  Laterality: N/A;   nissenfundiplication     TOTAL KNEE ARTHROPLASTY Right 05/19/2021   Procedure: TOTAL KNEE ARTHROPLASTY;  Surgeon: Paralee Cancel, MD;  Location: [facility];  Service: Orthopedics;  Laterality: Right;     Social History:   reports that she has never smoked. She has been exposed to tobacco smoke. She has never used smokeless tobacco. She reports that she does not drink alcohol and does not use drugs.    Family History:  Her family history includes Blindness in her sister; Diabetes in her mother and sister; Healthy in her son; Heart Problems in her sister; Heart attack in her brother, brother, father, and mother; Heart failure in her father; Hypertension in her father and sister; Rheum arthritis in her daughter; Throat cancer in her brother.   Allergies Allergies  Allergen Reactions   Sulfa Antibiotics Other (See Comments)    High fever Body ache   Atorvastatin Other (See Comments)    Extreme muscle cramps and muscle aches   Amoxicillin Rash     Home Medications  Prior to Admission medications   Medication Sig Start Date End Date Taking? Authorizing Provider  azithromycin (ZITHROMAX) 250 MG tablet Take 1 tablet (250 mg total)  by mouth daily. Take first 2 tablets together, then 1 every day until finished. 04/13/22  Yes Hayden Rasmussen, MD  predniSONE (DELTASONE) 20 MG tablet Take 1 tablet (20 mg total) by mouth daily. 04/13/22  Yes Hayden Rasmussen, MD  acyclovir (ZOVIRAX) 400 MG tablet Take 400 mg by mouth every 8 (eight) hours as needed. Fever blisters 05/19/20   [provider]  albuterol (VENTOLIN HFA) 108 (90 Base) MCG/ACT inhaler Inhale 2 puffs into the lungs every 6 (six) hours as needed. 04/08/22   Spero Geralds, MD  amLODipine (NORVASC) 10 MG tablet Take 1 tablet (10 mg total) by mouth daily. 11/04/21 10/30/22  Buford Dresser, MD  calcium-vitamin D (OSCAL WITH D) 500-200 MG-UNIT tablet Take 1 tablet by mouth daily with breakfast.    [provider]  carvedilol (COREG) 25 MG tablet Take 1 tablet (25 mg total) by mouth 2 (two) times daily with a meal. 03/30/21   Buford Dresser, MD  cetirizine (ZYRTEC) 10 MG tablet Take 10 mg by mouth daily.    [provider]  docusate sodium (COLACE) 100 MG capsule Take 1 capsule (100 mg total) by mouth 2 (two) times daily. 05/20/21   Irving Copas, PA-C  furosemide (LASIX) 20 MG tablet Take 20-40 mg by  mouth See admin instructions. Taking 20 mg every Sun., Tues., Thurs., and Sat. Taking 40 mg all other days.    [provider]  gabapentin (NEURONTIN) 300 MG capsule Take 300 mg by mouth as needed. 09/02/20   [provider]  hydrALAZINE (APRESOLINE) 25 MG tablet Take 1 tablet by mouth as needed.    [provider]  irbesartan (AVAPRO) 150 MG tablet Take 1 tablet by mouth once daily 03/10/22   Buford Dresser, MD  levothyroxine (SYNTHROID, LEVOTHROID) 150 MCG tablet Take 75-150 mcg by mouth See admin instructions. Pt alternates between 150 mcg and 75 mcg each day with before breakfast    [provider]  metFORMIN (GLUCOPHAGE-XR) 500 MG 24 hr tablet Take 2 tablets (1,000 mg total) by mouth 2 (two) times daily. Patient taking differently: Take 1,000 mg by mouth daily with breakfast. 09/25/20   Charlynne Cousins, MD  rosuvastatin (CRESTOR) 10 MG tablet Take 5 mg by mouth daily.    [provider]  Semaglutide, 1 MG/DOSE, (OZEMPIC, 1 MG/DOSE,) 4 MG/3ML SOPN Inject 1 mg into the skin once a week. Pt take sometimes    [provider]  spironolactone (ALDACTONE) 25 MG tablet Take 25 mg by mouth daily. 11/22/21   [provider]  zolpidem (AMBIEN) 10 MG tablet Take 10 mg by mouth at bedtime.    [provider]     Critical care time: n/a    Freda Jackson, MD [facility] Office: [phone]   See Amion for personal pager PCCM on call pager [phone] until 7pm. Please call Elink 7p-7a. [phone]

## 2022-04-14 ENCOUNTER — Other Ambulatory Visit (HOSPITAL_COMMUNITY): Payer: Self-pay | Admitting: Gastroenterology

## 2022-04-14 ENCOUNTER — Ambulatory Visit (INDEPENDENT_AMBULATORY_CARE_PROVIDER_SITE_OTHER): Payer: Medicare Other | Admitting: Internal Medicine

## 2022-04-14 DIAGNOSIS — D860 Sarcoidosis of lung: Secondary | ICD-10-CM | POA: Diagnosis not present

## 2022-04-14 DIAGNOSIS — R935 Abnormal findings on diagnostic imaging of other abdominal regions, including retroperitoneum: Secondary | ICD-10-CM

## 2022-04-14 LAB — PULMONARY FUNCTION TEST
DL/VA % pred: 110 %
DL/VA: 4.6 ml/min/mmHg/L
DLCO cor % pred: 90 %
DLCO cor: 16.18 ml/min/mmHg
DLCO unc % pred: 90 %
DLCO unc: 16.18 ml/min/mmHg
FEF 25-75 Post: 2.36 L/sec
FEF 25-75 Pre: 1.85 L/sec
FEF2575-%Change-Post: 27 %
FEF2575-%Pred-Post: 143 %
FEF2575-%Pred-Pre: 112 %
FEV1-%Change-Post: 15 %
FEV1-%Pred-Post: 99 %
FEV1-%Pred-Pre: 86 %
FEV1-Post: 1.97 L
FEV1-Pre: 1.71 L
FEV1FVC-%Change-Post: -2 %
FEV1FVC-%Pred-Pre: 113 %
FEV6-%Change-Post: 18 %
FEV6-%Pred-Post: 94 %
FEV6-%Pred-Pre: 79 %
FEV6-Post: 2.37 L
FEV6-Pre: 2 L
FEV6FVC-%Change-Post: 0 %
FEV6FVC-%Pred-Post: 105 %
FEV6FVC-%Pred-Pre: 104 %
FVC-%Change-Post: 18 %
FVC-%Pred-Post: 89 %
FVC-%Pred-Pre: 75 %
FVC-Post: 2.37 L
FVC-Pre: 2.01 L
Post FEV1/FVC ratio: 83 %
Post FEV6/FVC ratio: 100 %
Pre FEV1/FVC ratio: 85 %
Pre FEV6/FVC Ratio: 100 %
RV % pred: 73 %
RV: 1.57 L
TLC % pred: 80 %
TLC: 3.84 L

## 2022-04-14 NOTE — Progress Notes (Signed)
Full PFT Performed Today  

## 2022-04-14 NOTE — Patient Instructions (Signed)
Full PFT Performed Today  

## 2022-04-16 LAB — ANCA TITERS
Atypical P-ANCA titer: <1:20 {titer}
C-ANCA: <1:20 {titer}
P-ANCA: 1:640 {titer} — ABNORMAL HIGH

## 2022-04-22 ENCOUNTER — Ambulatory Visit (HOSPITAL_COMMUNITY)
Admission: RE | Admit: 2022-04-22 | Discharge: 2022-04-22 | Disposition: A | Discharge status: Home / Self Care | Payer: Medicare Other | Source: Ambulatory Visit | Attending: Gastroenterology | Admitting: Gastroenterology

## 2022-04-22 DIAGNOSIS — R935 Abnormal findings on diagnostic imaging of other abdominal regions, including retroperitoneum: Secondary | ICD-10-CM | POA: Insufficient documentation

## 2022-04-22 MED ORDER — GADOBUTROL 1 MMOL/ML IV SOLN
8.0000 mL | Freq: Once | INTRAVENOUS | Status: AC | PRN
  Administered 2022-04-22: 8 mL via INTRAVENOUS

## 2022-04-23 ENCOUNTER — Telehealth: Payer: Self-pay | Admitting: Hematology and Oncology

## 2022-04-23 NOTE — Telephone Encounter (Signed)
Called patient per 4/5 secure chat to schedule f/u. Left voicemail with new appointment information and contact details if needing to reschedule.

## 2022-04-30 ENCOUNTER — Inpatient Hospital Stay (HOSPITAL_BASED_OUTPATIENT_CLINIC_OR_DEPARTMENT_OTHER): Payer: Medicare Other | Admitting: Hematology and Oncology

## 2022-04-30 ENCOUNTER — Inpatient Hospital Stay: Payer: Medicare Other | Attending: Hematology and Oncology

## 2022-04-30 ENCOUNTER — Other Ambulatory Visit: Payer: Self-pay | Admitting: Hematology and Oncology

## 2022-04-30 VITALS — BP 152/56 | HR 68 | Temp 97.4°F | Resp 14 | Wt 179.0 lb

## 2022-04-30 DIAGNOSIS — K760 Fatty (change of) liver, not elsewhere classified: Secondary | ICD-10-CM | POA: Insufficient documentation

## 2022-04-30 DIAGNOSIS — I110 Hypertensive heart disease with heart failure: Secondary | ICD-10-CM | POA: Diagnosis not present

## 2022-04-30 DIAGNOSIS — D696 Thrombocytopenia, unspecified: Secondary | ICD-10-CM | POA: Diagnosis not present

## 2022-04-30 DIAGNOSIS — D869 Sarcoidosis, unspecified: Secondary | ICD-10-CM | POA: Diagnosis not present

## 2022-04-30 DIAGNOSIS — K7460 Unspecified cirrhosis of liver: Secondary | ICD-10-CM | POA: Diagnosis not present

## 2022-04-30 DIAGNOSIS — Z79899 Other long term (current) drug therapy: Secondary | ICD-10-CM | POA: Diagnosis not present

## 2022-04-30 DIAGNOSIS — E119 Type 2 diabetes mellitus without complications: Secondary | ICD-10-CM | POA: Diagnosis not present

## 2022-04-30 DIAGNOSIS — C220 Liver cell carcinoma: Secondary | ICD-10-CM | POA: Diagnosis present

## 2022-04-30 DIAGNOSIS — I2510 Atherosclerotic heart disease of native coronary artery without angina pectoris: Secondary | ICD-10-CM | POA: Insufficient documentation

## 2022-04-30 DIAGNOSIS — Z801 Family history of malignant neoplasm of trachea, bronchus and lung: Secondary | ICD-10-CM | POA: Insufficient documentation

## 2022-04-30 LAB — CBC WITH DIFFERENTIAL (CANCER CENTER ONLY)
Abs Immature Granulocytes: 0.04 10*3/uL (ref 0.00–0.07)
Basophils Absolute: 0.1 10*3/uL (ref 0.0–0.1)
Basophils Relative: 1 %
Eosinophils Absolute: 0.3 10*3/uL (ref 0.0–0.5)
Eosinophils Relative: 6 %
HCT: 36.8 % (ref 36.0–46.0)
Hemoglobin: 12.2 g/dL (ref 12.0–15.0)
Immature Granulocytes: 1 %
Lymphocytes Relative: 17 %
Lymphs Abs: 0.7 10*3/uL (ref 0.7–4.0)
MCH: 28.6 pg (ref 26.0–34.0)
MCHC: 33.2 g/dL (ref 30.0–36.0)
MCV: 86.2 fL (ref 80.0–100.0)
Monocytes Absolute: 0.3 10*3/uL (ref 0.1–1.0)
Monocytes Relative: 7 %
Neutro Abs: 3 10*3/uL (ref 1.7–7.7)
Neutrophils Relative %: 68 %
Platelet Count: 98 10*3/uL — ABNORMAL LOW (ref 150–400)
RBC: 4.27 MIL/uL (ref 3.87–5.11)
RDW: 14.9 % (ref 11.5–15.5)
WBC Count: 4.4 10*3/uL (ref 4.0–10.5)
nRBC: 0 % (ref 0.0–0.2)

## 2022-04-30 LAB — CMP (CANCER CENTER ONLY)
ALT: 46 U/L — ABNORMAL HIGH (ref 0–44)
AST: 56 U/L — ABNORMAL HIGH (ref 15–41)
Albumin: 3.8 g/dL (ref 3.5–5.0)
Alkaline Phosphatase: 109 U/L (ref 38–126)
Anion gap: 4 — ABNORMAL LOW (ref 5–15)
BUN: 12 mg/dL (ref 8–23)
CO2: 27 mmol/L (ref 22–32)
Calcium: 10 mg/dL (ref 8.9–10.3)
Chloride: 109 mmol/L (ref 98–111)
Creatinine: 0.72 mg/dL (ref 0.44–1.00)
GFR, Estimated: >60 mL/min (ref >60)
Glucose, Bld: 90 mg/dL (ref 70–99)
Potassium: 4.4 mmol/L (ref 3.5–5.1)
Sodium: 140 mmol/L (ref 135–145)
Total Bilirubin: 0.9 mg/dL (ref 0.3–1.2)
Total Protein: 7.6 g/dL (ref 6.5–8.1)

## 2022-04-30 LAB — CEA (ACCESS): CEA (CHCC): 1.21 ng/mL (ref 0.00–5.00)

## 2022-04-30 NOTE — Progress Notes (Unsigned)
[facility] [phone]   Fax:([phone]  PROGRESS NOTE  Patient Care Team: Ralene Ok, MD as PCP - General (Internal Medicine) Jodelle Red, MD as PCP - Cardiology (Cardiology)  Hematological/Oncological History # Hepatocellular Carcinoma, Stage 1b 04/13/2022: Patient underwent a CT scan of the chest wall emergency department for hemoptysis.  The scan showed no clear source of the bleeding but the report noted hepatic cirrhosis with enlarging heterogeneous enhancing lesion centrally in the right hepatic lobe, suspicious for hepatocellular carcinoma. Recommend further evaluation with abdominal MRI without and with contrast. 04/22/2022: MRI abdomen performed which showed. 3.1 x 3.1 cm early arterial phase enhancing lesion in segment 8 with subsequent washout and capsular enhancement. 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 Leonides Schanz     Interval History:  [name] 72 y.o. female with medical history significant for newly diagnosed [facility] who presents for a follow up visit. The patient's last visit was on 12/16/2021 at which time she established care for thrombocytopenia. In the interim since the last visit she underwent a CT scan of the chest for hemoptysis which showed a liver lesion, later confirmed to be LIRADS 5 by MRI.  On exam today [name] is accompanied by her husband.  She reports that this all began when she woke up early 1 morning coughing up blood.  She reports that it was "a lot of blood".  When she was in the emergency department she underwent a CT scan which did not show a clear source of the bleeding, but a lesion was noted in the liver.  She subsequently underwent an MRI of the liver  which did confirm a 3.1 x 3.1 cm mass, most consistent with HCC (LI RADS 5).  Of note she did not find a clear source for her hemoptysis and has not had any further issues with bleeding.  She notes she is not having any abdominal discomfort and denies any nausea, vomiting, or diarrhea.  She is also not having any fevers, chills, sweats.  The bulk of our discussion focused on the diagnosis of HCC and the plan moving forward.  We discussed the need for multidisciplinary evaluation and the possible treatment options including surgery, tumor directed therapy, and immunotherapy/chemotherapy.  The patient voiced understanding of the plan.  MEDICAL HISTORY:  Past Medical History:  Diagnosis Date   Arthritis    Asthma    CAD (coronary artery disease)    CHF (congestive heart failure) (HCC)    Complication of anesthesia    trouble waking up once   COPD (chronic obstructive pulmonary disease) (HCC)    Diabetes mellitus without complication (HCC)    Dysrhythmia    Heart murmur    Hypertension    Hypothyroidism    Liver disease    NAFLD cirrhosis   Mild aortic stenosis    Pneumonia    Sarcoidosis    Sarcoidosis of lung (HCC)    Sarcoidosis of lymph nodes     SURGICAL HISTORY: Past Surgical History:  Procedure Laterality Date   ABDOMINAL HYSTERECTOMY     ABDOMINAL SURGERY     CHOLECYSTECTOMY     ESOPHAGOGASTRODUODENOSCOPY (EGD) WITH PROPOFOL N/A 04/17/2021   Procedure: ESOPHAGOGASTRODUODENOSCOPY (EGD) WITH PROPOFOL;  Surgeon: Jeani Hawking, MD;  Location:  [facility];  Service: Gastroenterology;  Laterality: N/A;   HERNIA REPAIR     LASIK  2001   LEFT HEART CATH AND CORONARY ANGIOGRAPHY N/A 09/23/2020   Procedure: LEFT HEART CATH AND CORONARY ANGIOGRAPHY;  Surgeon: Lennette Bihari, MD;  Location: [facility];  Service: Cardiovascular;  Laterality: N/A;   nissenfundiplication     TOTAL KNEE ARTHROPLASTY Right 05/19/2021   Procedure: TOTAL KNEE ARTHROPLASTY;  Surgeon: Durene Romans, MD;  Location: [facility];  Service: Orthopedics;  Laterality: Right;    SOCIAL HISTORY: Social History   Socioeconomic History   Marital status: Married    Spouse name: Not on file   Number of children: Not on file   Years of education: Not on file   Highest education level: Not on file  Occupational History   Not on file  Tobacco Use   Smoking status: Never    Passive exposure: Past (minimal)   Smokeless tobacco: Never  Vaping Use   Vaping Use: Never used  Substance and Sexual Activity   Alcohol use: No   Drug use: No   Sexual activity: Not Currently  Other Topics Concern   Not on file  Social History Narrative   Not on file   Social Determinants of Health   Financial Resource Strain: Not on file  Food Insecurity: Not on file  Transportation Needs: Not on file  Physical Activity: Not on file  Stress: Not on file  Social Connections: Not on file  Intimate Partner Violence: Not on file    FAMILY HISTORY: Family History  Problem Relation Age of Onset   Diabetes Mother    Heart attack Mother    Heart failure Father    Hypertension Father    Heart attack Father    Heart Problems Sister    Blindness Sister    Diabetes Sister    Hypertension Sister    Heart attack Brother    Heart attack Brother    Throat cancer Brother    Healthy Son    Rheum arthritis Daughter     ALLERGIES:  is allergic to sulfa antibiotics, atorvastatin, and amoxicillin.  MEDICATIONS:  Current Outpatient Medications  Medication Sig Dispense Refill   ALPRAZolam (XANAX) 0.25 MG tablet Take 0.25 mg by mouth 3 (three) times daily as needed.     tiZANidine (ZANAFLEX) 4 MG tablet Take 4 mg by mouth every 8 (eight) hours as needed.     acyclovir (ZOVIRAX) 400 MG tablet Take 400 mg by mouth every 8 (eight) hours as needed. Fever blisters     albuterol (VENTOLIN HFA) 108 (90 Base) MCG/ACT inhaler Inhale 2 puffs into the lungs every 6 (six) hours as needed. 18 g 5   amLODipine (NORVASC)  10 MG tablet Take 1 tablet (10 mg total) by mouth daily. 90 tablet 3   calcium-vitamin D (OSCAL WITH D) 500-200 MG-UNIT tablet Take 1 tablet by mouth daily with breakfast.     carvedilol (COREG) 25 MG tablet Take 1 tablet (25 mg total) by mouth 2 (two) times daily with a meal. 180 tablet 3   cetirizine (ZYRTEC) 10 MG tablet Take 10 mg by mouth daily.     docusate sodium (COLACE) 100 MG capsule Take 1 capsule (100 mg total) by mouth 2 (two) times daily. 10 capsule 0   furosemide (LASIX) 20 MG tablet Take 20-40 mg by mouth See admin instructions. Taking 20 mg every Sun., Tues., Thurs., and Sat. Taking 40 mg all other days.  gabapentin (NEURONTIN) 300 MG capsule Take 300 mg by mouth as needed.     hydrALAZINE (APRESOLINE) 25 MG tablet Take 1 tablet by mouth as needed.     irbesartan (AVAPRO) 150 MG tablet Take 1 tablet by mouth once daily 90 tablet 1   levothyroxine (SYNTHROID, LEVOTHROID) 150 MCG tablet Take 75-150 mcg by mouth See admin instructions. Pt alternates between 150 mcg and 75 mcg each day with before breakfast     metFORMIN (GLUCOPHAGE-XR) 500 MG 24 hr tablet Take 2 tablets (1,000 mg total) by mouth 2 (two) times daily. (Patient taking differently: Take 1,000 mg by mouth daily with breakfast.) 120 tablet 3   rosuvastatin (CRESTOR) 10 MG tablet Take 5 mg by mouth daily.     Semaglutide, 1 MG/DOSE, (OZEMPIC, 1 MG/DOSE,) 4 MG/3ML SOPN Inject 1 mg into the skin once a week. Pt take sometimes     spironolactone (ALDACTONE) 25 MG tablet Take 25 mg by mouth daily.     zolpidem (AMBIEN) 10 MG tablet Take 10 mg by mouth at bedtime.     No current facility-administered medications for this visit.    REVIEW OF SYSTEMS:   Constitutional: ( - ) fevers, ( - )  chills , ( - ) night sweats Eyes: ( - ) blurriness of vision, ( - ) double vision, ( - ) watery eyes Ears, nose, mouth, throat, and face: ( - ) mucositis, ( - ) sore throat Respiratory: ( - ) cough, ( - ) dyspnea, ( - )  wheezes Cardiovascular: ( - ) palpitation, ( - ) chest discomfort, ( - ) lower extremity swelling Gastrointestinal:  ( - ) nausea, ( - ) heartburn, ( - ) change in bowel habits Skin: ( - ) abnormal skin rashes Lymphatics: ( - ) new lymphadenopathy, ( - ) easy bruising Neurological: ( - ) numbness, ( - ) tingling, ( - ) new weaknesses Behavioral/Psych: ( - ) mood change, ( - ) new changes  All other systems were reviewed with the patient and are negative.  PHYSICAL EXAMINATION: ECOG PERFORMANCE STATUS: 0 - Asymptomatic  Vitals:   04/30/22 1335  BP: (!) 152/56  Pulse: 68  Resp: 14  Temp: (!) 97.4 F (36.3 C)  SpO2: 97%   Filed Weights   04/30/22 1335  Weight: 179 lb (81.2 kg)    GENERAL: Well-appearing elderly Caucasian female, alert, no distress and comfortable SKIN: skin color, texture, turgor are normal, no rashes or significant lesions EYES: conjunctiva are pink and non-injected, sclera clear LUNGS: clear to auscultation and percussion with normal breathing effort HEART: regular rate & rhythm and no murmurs and no lower extremity edema Musculoskeletal: no cyanosis of digits and no clubbing  PSYCH: alert & oriented x 3, fluent speech NEURO: no focal motor/sensory deficits  LABORATORY DATA:  I have reviewed the data as listed    Latest Ref Rng & Units 04/30/2022   12:31 PM 04/13/2022    9:48 AM 12/16/2021    9:57 AM  CBC  WBC 4.0 - 10.5 K/uL 4.4  2.9  3.2   Hemoglobin 12.0 - 15.0 g/dL 16.1  09.6  04.5   Hematocrit 36.0 - 46.0 % 36.8  36.7  36.4   Platelets 150 - 400 K/uL 98  78  95        Latest Ref Rng & Units 04/30/2022   12:31 PM 04/13/2022    9:48 AM 12/16/2021    9:57 AM  CMP  Glucose 70 - 99 mg/dL  90  110  115   BUN 8 - 23 mg/dL 12  12  13    Creatinine 0.44 - 1.00 mg/dL 1.61  0.96  0.45   Sodium 135 - 145 mmol/L 140  138  140   Potassium 3.5 - 5.1 mmol/L 4.4  3.5  3.8   Chloride 98 - 111 mmol/L 109  107  105   CO2 22 - 32 mmol/L 27  24  30    Calcium 8.9  - 10.3 mg/dL 40.9  9.1  81.1   Total Protein 6.5 - 8.1 g/dL 7.6  7.7  8.1   Total Bilirubin 0.3 - 1.2 mg/dL 0.9  0.8  0.5   Alkaline Phos 38 - 126 U/L 109  105  130   AST 15 - 41 U/L 56  54  35   ALT 0 - 44 U/L 46  44  25     RADIOGRAPHIC STUDIES: MR ABDOMEN WWO CONTRAST  Result Date: 04/22/2022 CLINICAL DATA:  Cirrhosis. Suspected liver lesion on recent chest CT. EXAM: MRI ABDOMEN WITHOUT AND WITH CONTRAST TECHNIQUE: Multiplanar multisequence MR imaging of the abdomen was performed both before and after the administration of intravenous contrast. CONTRAST:  8mL GADAVIST GADOBUTROL 1 MMOL/ML IV SOLN COMPARISON:  Recent chest CT 04/13/2022 and prior abdominal CT scan 09/25/2021 FINDINGS: Lower chest: The lung bases are clear of an acute process. No pleural or pericardial effusion. No pulmonary lesions. Stable hiatal hernia, likely slipped Nissen fundoplication. Hepatobiliary: Advanced cirrhotic changes involving the liver. As demonstrated on CT scan there is an early arterial phase enhancing lesion in segment 8 measuring 3.1 x 3.1 cm. This demonstrates subsequent non peripheral washout and delayed enhancing capsule. MR findings consistent with hepatocellular carcinoma, LI-RADS 5. No other hepatic lesions are identified. No findings for portal venous thrombus. Small periportal lymph nodes are typical with cirrhosis. No enlarged nodes to suggest metastatic adenopathy. The gallbladder is surgically absent. Stable mild common bile duct dilatation and mild dilatation of the cystic duct remnant. No intrahepatic biliary dilatation. Pancreas:  No mass, inflammation or ductal dilatation. Spleen:  Mild stable splenomegaly.  No splenic lesions. Adrenals/Urinary Tract: The adrenal glands and kidneys are unremarkable. Stomach/Bowel: Suspect slipped Nissen fundoplication. Stomach is otherwise unremarkable. The duodenum, visualized small bowel and visualized colon are grossly normal. Vascular/Lymphatic: The aorta and  branch vessels are patent. The major venous structures are patent. Other:  No ascites or abdominal wall hernia. Musculoskeletal: No significant bony findings. IMPRESSION: 1. 3.1 x 3.1 cm early arterial phase enhancing lesion in segment 8 with subsequent washout and capsular enhancement. Findings consistent with hepatocellular carcinoma, LI-RADS 5. 2. No other hepatic lesions are identified. 3. Advanced cirrhotic changes involving the liver. 4. Mild splenomegaly. 5. Stable hiatal hernia, likely slipped Nissen fundoplication. Electronically Signed   By: Rudie Meyer M.D.   On: 04/22/2022 09:38   CT Chest W Contrast  Result Date: 04/13/2022 CLINICAL DATA:  Hemoptysis. Coughing up blood clots since this morning. Patient reports recent lymph node biopsy showing sarcoidosis. EXAM: CT CHEST WITH CONTRAST TECHNIQUE: Multidetector CT imaging of the chest was performed during intravenous contrast administration. RADIATION DOSE REDUCTION: This exam was performed according to the departmental dose-optimization program which includes automated exposure control, adjustment of the mA and/or kV according to patient size and/or use of iterative reconstruction technique. CONTRAST:  75mL OMNIPAQUE IOHEXOL 300 MG/ML  SOLN COMPARISON:  Radiographs 04/13/2022. Chest CTA 09/25/2021 and CT 03/06/2021. FINDINGS: Cardiovascular: No acute vascular findings are seen. There is atherosclerosis of  the aorta, great vessels and coronary arteries. The heart size is normal. There is no pericardial effusion. Mediastinum/Nodes: Again demonstrated are multiple mildly enlarged mediastinal and hilar lymph nodes, similar to previous studies. Representative nodes include a right paratracheal node measuring 1.8 cm short axis on image 51/2 and a subcarinal node measuring 1.5 cm on image 58/2. No axillary adenopathy. There is a stable small hiatal hernia. Diffuse enlargement of the thyroid gland is again noted (previously evaluated by ultrasound  12/20/2013; no follow-up imaging recommended). Lungs/Pleura: No pleural effusion or pneumothorax. Interval improved aeration of both lung bases. 4 mm right middle lobe nodule on image 62/7 is unchanged from CT 11/27/2020 (best seen on the sagittal images), consistent with a benign finding. No edema, confluent airspace opacity or suspicious pulmonary nodule. Upper abdomen: Morphologic changes of diffuse hepatic cirrhosis are again noted with contour irregularity of the liver and relative enlargement of the caudate and left lobes. There is a mildly heterogeneous enhancing lesion centrally in the right hepatic lobe which measures up to 2.2 x 2.0 cm on image 105/2 and 2.6 cm on coronal image 55/5. In the setting of cirrhosis, this enlarging lesion is concerning for hepatocellular carcinoma. Further evaluation recommended. Small lymph nodes in the porta hepatis are grossly stable. There is stable mild extrahepatic biliary dilatation status post cholecystectomy. Stable mild splenomegaly. Musculoskeletal/Chest wall: There is no chest wall mass or suspicious osseous finding. IMPRESSION: 1. No acute chest findings or explanation for hemoptysis identified. 2. Stable mediastinal and hilar adenopathy, consistent with known sarcoidosis. 3. Hepatic cirrhosis with enlarging heterogeneous enhancing lesion centrally in the right hepatic lobe, suspicious for hepatocellular carcinoma. Recommend further evaluation with abdominal MRI without and with contrast. 4. Stable mild extrahepatic biliary dilatation status post cholecystectomy. 5.  Aortic Atherosclerosis (ICD10-I70.0). Electronically Signed   By: Carey Bullocks M.D.   On: 04/13/2022 11:38   DG Chest Port 1 View  Result Date: 04/13/2022 CLINICAL DATA:  Hemoptysis EXAM: PORTABLE CHEST 1 VIEW COMPARISON:  Previous studies including the examination done on 09/25/2021 FINDINGS: Transverse diameter of heart is increased. There are no signs of pulmonary edema or focal pulmonary  consolidation. There is no pleural effusion or pneumothorax. There is soft tissue fullness in superior mediastinum with extrinsic pressure over the trachea, most likely due to enlarged thyroid. IMPRESSION: There are no new infiltrates or signs of pulmonary edema. Electronically Signed   By: Ernie Avena M.D.   On: 04/13/2022 10:17    ASSESSMENT & PLAN HODA HON 72 y.o. female with medical history significant for newly diagnosed [facility] who presents for a follow up visit.   # Hepatocellular Carcinoma, Stage 1b -- Diagnosis confirmed based on the MRI imaging. -- Today we will perform labs to include tumor markers such as alpha-fetoprotein, CEA, and CA 19-9 -- Will plan to have the patient discussed at multidisciplinary conference to discuss possible treatment options.  Options would include surgical resection, tumor directed therapy, or immunotherapy/chemotherapy. -- Labs today show white blood cell count 4.4, hemoglobin 12.2, MCV 86.2, and platelets of 98 with LFTs showing AST 56, and ALT 46  # Leukopenia/Thrombocytopenia -- Secondary to cirrhosis. -- Appears stable compared to prior, will continue to monitor.  No orders of the defined types were placed in this encounter.   All questions were answered. The patient knows to call the clinic with any problems, questions or concerns.  A total of more than 30 minutes were spent on this encounter with face-to-face time and non-face-to-face time, including preparing  to see the patient, ordering tests and/or medications, counseling the patient and coordination of care as outlined above.   Ulysees Barns, MD Department of Hematology/Oncology [facility] at [facility] Phone: [phone] Pager: [phone] Email: [email or web address]  05/01/2022 5:19 PM

## 2022-05-01 DIAGNOSIS — C220 Liver cell carcinoma: Secondary | ICD-10-CM | POA: Insufficient documentation

## 2022-05-01 LAB — AFP TUMOR MARKER: AFP, Serum, Tumor Marker: 4.1 ng/mL (ref 0.0–9.2)

## 2022-05-01 LAB — CANCER ANTIGEN 19-9: CA 19-9: 27 U/mL (ref 0–35)

## 2022-05-05 ENCOUNTER — Other Ambulatory Visit (HOSPITAL_BASED_OUTPATIENT_CLINIC_OR_DEPARTMENT_OTHER): Payer: Self-pay | Admitting: Cardiology

## 2022-05-05 DIAGNOSIS — I3489 Other nonrheumatic mitral valve disorders: Secondary | ICD-10-CM

## 2022-05-05 DIAGNOSIS — I2510 Atherosclerotic heart disease of native coronary artery without angina pectoris: Secondary | ICD-10-CM

## 2022-05-05 DIAGNOSIS — I10 Essential (primary) hypertension: Secondary | ICD-10-CM

## 2022-05-06 NOTE — Telephone Encounter (Signed)
Rx request sent to pharmacy.  

## 2022-05-12 ENCOUNTER — Other Ambulatory Visit: Payer: Self-pay

## 2022-05-12 NOTE — Progress Notes (Signed)
The proposed treatment discussed in conference is for discussion purpose only and is not a binding recommendation.  The patients have not been physically examined, or presented with their treatment options.  Therefore, final treatment plans cannot be decided.  

## 2022-05-14 ENCOUNTER — Other Ambulatory Visit: Payer: Self-pay

## 2022-05-14 DIAGNOSIS — C220 Liver cell carcinoma: Secondary | ICD-10-CM

## 2022-05-14 NOTE — Progress Notes (Signed)
Referral for liver transplant entered per Dr Irene Limbo.

## 2022-05-14 NOTE — Progress Notes (Signed)
Referral, demographics, insurance information, ov note, and MR report faxed to [facility]-[place], [phone].

## 2022-05-14 NOTE — Progress Notes (Signed)
Referral, demographics, insurance information, ov note, and MR report faxed to [facility] at [phone]

## 2022-05-15 ENCOUNTER — Telehealth: Payer: Self-pay | Admitting: Hematology and Oncology

## 2022-05-15 NOTE — Telephone Encounter (Signed)
Spoke to patient regarding the results of the [facility] conference. Recommend consideration of Liver Transplant vs tumor directed therapy (ablation/embolization). Will make referral to Liver Transplant specialists. If not feasible will discuss options with IR. Patient voiced understanding of plan moving forward.  Ulysees Barns, MD Department of Hematology/Oncology [facility] at [facility] Phone: [phone] Pager: [phone] Email: [email or web address]

## 2022-05-31 ENCOUNTER — Other Ambulatory Visit: Payer: Self-pay | Admitting: Nurse Practitioner

## 2022-05-31 DIAGNOSIS — C220 Liver cell carcinoma: Secondary | ICD-10-CM

## 2022-06-03 ENCOUNTER — Ambulatory Visit
Admission: RE | Admit: 2022-06-03 | Discharge: 2022-06-03 | Disposition: A | Discharge status: Home / Self Care | Payer: Medicare Other | Source: Ambulatory Visit | Attending: Nurse Practitioner | Admitting: Nurse Practitioner

## 2022-06-03 ENCOUNTER — Other Ambulatory Visit: Payer: Self-pay | Admitting: Interventional Radiology

## 2022-06-03 DIAGNOSIS — K769 Liver disease, unspecified: Secondary | ICD-10-CM

## 2022-06-03 DIAGNOSIS — C220 Liver cell carcinoma: Secondary | ICD-10-CM

## 2022-06-03 NOTE — Consult Note (Signed)
Chief Complaint: HCC  Referring Physician(s): Drazek,Dawn  History of Present Illness: [name] is a 72 y.o. female with past medical history significant for pulmonary sarcoidosis, mild aortic stenosis, hypertension, hypothyroidism, COPD, asthma and CAD with known history of NASH cirrhosis who was found to have a worrisome enhancing liver lesion on chest CT performed 04/13/2022, performed for the evaluation of hemoptysis.  Subsequent abdominal MRI performed 04/22/2022 confirms the presence of an approximately 3.1 x 3.1 cm enhancing lesion within segment 8 of the right lobe of the liver, characterized as LI-RADS 5, definitive HCC.  The patient was subsequently been evaluated by Dr. Freida Busman of the hepatobiliary surgical service and deemed a nonoperative candidate given her medical comorbidities and degree of cirrhosis.  The patient was then evaluated by Annamarie Major from the hepatobiliary transplant team and the patient was referred for consideration of percutaneous management.  The patient is seen today in interventional radiology clinic accompanied by her husband  The patient remains asymptomatic in regards to this liver mass.  Specifically, no change in appetite or energy level.  No yellowing of the skin or eyes.  No increased abdominal girth.  The patient has never had a clinically significant upper or lower GI bleed.  She denies any episodes of altered mental status to suggest encephalopathy  Again, the presumed diagnosis is NASH cirrhosis as patient has no history of heavy alcohol abuse or hepatitis.  Past Medical History:  Diagnosis Date   Arthritis    Asthma    CAD (coronary artery disease)    CHF (congestive heart failure) (HCC)    Complication of anesthesia    trouble waking up once   COPD (chronic obstructive pulmonary disease) (HCC)    Diabetes mellitus without complication (HCC)    Dysrhythmia    Heart murmur    Hypertension    Hypothyroidism    Liver disease     NAFLD cirrhosis   Mild aortic stenosis    Pneumonia    Sarcoidosis    Sarcoidosis of lung (HCC)    Sarcoidosis of lymph nodes     Past Surgical History:  Procedure Laterality Date   ABDOMINAL HYSTERECTOMY     ABDOMINAL SURGERY     CHOLECYSTECTOMY     ESOPHAGOGASTRODUODENOSCOPY (EGD) WITH PROPOFOL N/A 04/17/2021   Procedure: ESOPHAGOGASTRODUODENOSCOPY (EGD) WITH PROPOFOL;  Surgeon: Jeani Hawking, MD;  Location: [facility];  Service: Gastroenterology;  Laterality: N/A;   HERNIA REPAIR     LASIK  2001   LEFT HEART CATH AND CORONARY ANGIOGRAPHY N/A 09/23/2020   Procedure: LEFT HEART CATH AND CORONARY ANGIOGRAPHY;  Surgeon: Lennette Bihari, MD;  Location: [facility];  Service: Cardiovascular;  Laterality: N/A;   nissenfundiplication     TOTAL KNEE ARTHROPLASTY Right 05/19/2021   Procedure: TOTAL KNEE ARTHROPLASTY;  Surgeon: Durene Romans, MD;  Location: [facility];  Service: Orthopedics;  Laterality: Right;    Allergies: Sulfa antibiotics, Atorvastatin, and Amoxicillin  Medications: Prior to Admission medications   Medication Sig Start Date End Date Taking? Authorizing Provider  acyclovir (ZOVIRAX) 400 MG tablet Take 400 mg by mouth every 8 (eight) hours as needed. Fever blisters 05/19/20   [provider]  albuterol (VENTOLIN HFA) 108 (90 Base) MCG/ACT inhaler Inhale 2 puffs into the lungs every 6 (six) hours as needed. 04/08/22   Charlott Holler, MD  ALPRAZolam Prudy Feeler) 0.25 MG tablet Take 0.25 mg by mouth 3 (three) times daily as needed. 04/27/22   [provider]  amLODipine (  NORVASC) 10 MG tablet Take 1 tablet (10 mg total) by mouth daily. 11/04/21 10/30/22  Jodelle Red, MD  calcium-vitamin D (OSCAL WITH D) 500-200 MG-UNIT tablet Take 1 tablet by mouth daily with breakfast.    [provider]  carvedilol (COREG) 25 MG tablet TAKE 1 TABLET BY MOUTH TWICE DAILY WITH A MEAL 05/06/22   Jodelle Red, MD  cetirizine (ZYRTEC) 10 MG tablet  Take 10 mg by mouth daily.    [provider]  docusate sodium (COLACE) 100 MG capsule Take 1 capsule (100 mg total) by mouth 2 (two) times daily. 05/20/21   Cassandria Anger, PA-C  furosemide (LASIX) 20 MG tablet Take 20-40 mg by mouth See admin instructions. Taking 20 mg every Sun., Tues., Thurs., and Sat. Taking 40 mg all other days.    [provider]  gabapentin (NEURONTIN) 300 MG capsule Take 300 mg by mouth as needed. 09/02/20   [provider]  hydrALAZINE (APRESOLINE) 25 MG tablet Take 1 tablet by mouth as needed.    [provider]  irbesartan (AVAPRO) 150 MG tablet Take 1 tablet by mouth once daily 03/10/22   Jodelle Red, MD  levothyroxine (SYNTHROID, LEVOTHROID) 150 MCG tablet Take 75-150 mcg by mouth See admin instructions. Pt alternates between 150 mcg and 75 mcg each day with before breakfast    [provider]  metFORMIN (GLUCOPHAGE-XR) 500 MG 24 hr tablet Take 2 tablets (1,000 mg total) by mouth 2 (two) times daily. Patient taking differently: Take 1,000 mg by mouth daily with breakfast. 09/25/20   Marinda Elk, MD  rosuvastatin (CRESTOR) 10 MG tablet Take 5 mg by mouth daily.    [provider]  Semaglutide, 1 MG/DOSE, (OZEMPIC, 1 MG/DOSE,) 4 MG/3ML SOPN Inject 1 mg into the skin once a week. Pt take sometimes    [provider]  spironolactone (ALDACTONE) 25 MG tablet Take 25 mg by mouth daily. 11/22/21   [provider]  tiZANidine (ZANAFLEX) 4 MG tablet Take 4 mg by mouth every 8 (eight) hours as needed. 02/10/22   [provider]  zolpidem (AMBIEN) 10 MG tablet Take 10 mg by mouth at bedtime.    [provider]     Family History  Problem Relation Age of Onset   Diabetes Mother    Heart attack Mother    Heart failure Father    Hypertension Father    Heart attack Father    Heart Problems Sister    Blindness Sister    Diabetes Sister    Hypertension Sister    Heart  attack Brother    Heart attack Brother    Throat cancer Brother    Healthy Son    Rheum arthritis Daughter     Social History   Socioeconomic History   Marital status: Married    Spouse name: Not on file   Number of children: Not on file   Years of education: Not on file   Highest education level: Not on file  Occupational History   Not on file  Tobacco Use   Smoking status: Never    Passive exposure: Past (minimal)   Smokeless tobacco: Never  Vaping Use   Vaping Use: Never used  Substance and Sexual Activity   Alcohol use: No   Drug use: No   Sexual activity: Not Currently  Other Topics Concern   Not on file  Social History Narrative   Not on file   Social Determinants of Health  Financial Resource Strain: Not on file  Food Insecurity: Not on file  Transportation Needs: Not on file  Physical Activity: Not on file  Stress: Not on file  Social Connections: Not on file    ECOG Status: 1 - Symptomatic but completely ambulatory  Review of Systems: A 12 point ROS discussed and pertinent positives are indicated in the HPI above.  All other systems are negative.  Review of Systems  Vital Signs: BP (!) 137/59 (BP Location: Left Arm, Patient Position: Sitting, Cuff Size: Normal)   Pulse 68   Temp 98.1 F (36.7 C) (Oral)   Wt 81.2 kg   SpO2 97% Comment: room air  BMI 32.22 kg/m    Physical Exam   Imaging:  CT abdomen and pelvis-09/25/2021 chest CT-04/13/2022 abdominal MRI-04/22/2022; 04/30/2018  Chest CT performed 04/13/2022 demonstrates an approximately 2.8 x 2.7 x 2.5 cm ill-defined enhancing lesion within the dome of the right lobe of the liver (coronal image 54, series 5; axial image 105, series 2), subsequently evaluated on abdominal MRI performed 04/22/2022 demonstrating a approximately 3.1 x 3.0 x 2.8 cm peripherally enhancing lesion which demonstrated washout characteristics compatible with an lie RADS 5 (definitively HCC) lesion.   This lesion was not  apparent on abdominal CT performed 09/25/2021 or on abdominal MRI performed 04/29/2021  The portal vein remains patent.  There is a minimal to moderate amount of atherosclerotic plaque within a normal caliber abdominal aorta.  Conventional branching pattern with apparent majority of vascular supply via the anterior division of the right hepatic artery.  Nodularity hepatic contour compatible with known cirrhosis.  The spleen is enlarged measuring 14.2 cm in diameter.  Note is made of a small splenule at the level of the splenic hilum.  There is a moderate sized hiatal hernia was postoperative change at the level of the GE junction.  No definitive esophageal or gastric varices. Labs:  CBC: Recent Labs    09/25/21 1759 12/16/21 0957 04/13/22 0948 04/30/22 1231  WBC 3.9* 3.2* 2.9* 4.4  HGB 11.0* 11.5* 12.0 12.2  HCT 34.8* 36.4 36.7 36.8  PLT 92* 95* 78* 98*    COAGS: Recent Labs    04/13/22 0948  INR 1.1    BMP: Recent Labs    09/25/21 1759 11/20/21 0914 12/16/21 0957 04/13/22 0948 04/30/22 1231  NA 140 142 140 138 140  K 3.6 3.9 3.8 3.5 4.4  CL 104 106 105 107 109  CO2 29 29 30 24 27   GLUCOSE 152* 98 115* 110* 90  BUN 14 12 13 12 12   CALCIUM 9.4 9.8 10.6* 9.1 10.0  CREATININE 0.78 0.70 0.79 0.63 0.72  GFRNONAA >60  --  >60 >60 >60    LIVER FUNCTION TESTS: Recent Labs    09/25/21 2131 11/20/21 0914 12/16/21 0957 04/13/22 0948 04/30/22 1231  BILITOT 0.8 0.6 0.5 0.8 0.9  AST 39 45* 35 54* 56*  ALT 35 41* 25 44 46*  ALKPHOS 112  --  130* 105 109  PROT 6.9 7.4 8.1 7.7 7.6  ALBUMIN 3.5  --  3.9 3.8 3.8    TUMOR MARKERS: Recent Labs    04/30/22 1231  CEA 1.21     Assessment and Plan:  MENNIE MADEN is a 72 y.o. female with past medical history significant for pulmonary sarcoidosis, mild aortic stenosis, hypertension, hypothyroidism, COPD, asthma and CAD with known history of NASH cirrhosis who was found to have a worrisome enhancing liver lesion on chest CT  performed 04/13/2022, performed  for the evaluation of hemoptysis.  Subsequent abdominal MRI performed 04/22/2022 confirms the presence of an approximately 3.1 x 3.1 cm enhancing lesion within segment 8 of the right lobe of the liver, characterized as LI-RADS 5, definitive HCC.  The patient remains asymptomatic in regards to this liver mass.    The following examinations were personally reviewed in detail: CT abdomen and pelvis - 09/25/2021; chest CT - 04/13/2022 abdominal MRI - 04/22/2022; 04/30/2018  Chest CT performed 04/13/2022 demonstrates an approximately 2.8 x 2.7 x 2.5 cm ill-defined enhancing lesion within the dome of the right lobe of the liver (coronal image 54, series 5; axial image 105, series 2), subsequently evaluated on abdominal MRI performed 04/22/2022 demonstrating a approximately 3.1 x 3.0 x 2.8 cm peripherally enhancing lesion which demonstrated washout characteristics compatible with an lie RADS 5 (definitively HCC) lesion.   This lesion was not apparent on abdominal CT performed 09/25/2021 or on abdominal MRI performed 04/29/2021.  The portal vein remains patent.  There is a minimal to moderate amount of atherosclerotic plaque within a normal caliber abdominal aorta.  Conventional branching pattern with apparent majority of vascular supply via the anterior division of the right hepatic artery.  Nodularity hepatic contour compatible with known cirrhosis.  The spleen is enlarged measuring 14.2 cm in diameter.  Note is made of a small splenule at the level of the splenic hilum.  There is a moderate sized hiatal hernia was postoperative change at the level of the GE junction.  No definitive esophageal or gastric varices. _________________________________________________________  Potential treatment options were discussed at length with the patient and the patient's husband Including both ablative and transcatheter treatments.  Fortunately, patient's bilirubin remains normal (0.9 on 04/30/2022).   Note, the patient's AFP level has not been found to be elevated as it was 4.1 and laboratory values obtained 04/30/2022.  I explained that the patient may be a candidate for image guided microwave ablation as she only has one known HCC which is in a reasonable location within the dome of the right lobe of the liver, peripheral from the biliary hilum, however I am concerned given the size of the lesion (measuring 3.1 cm in recent abdominal MRI) as well as rapid growth of the lesion within the past year.    I explained that percutaneous ablation is performed with curative intent however given size and rapid growth, I feel it is in the patient's best interest to undergo both a transcatheter bland embolization followed by a percutaneous ablation, both procedures to be performed at the same time with general anesthesia at [facility].  I explained that this procedure is performed with curative intent however as surgical margins are not achieved at the time of the procedure, this would be determined with subsequent examinations to be performed every 3 months for the foreseeable future given patient's history of advanced cirrhosis and rapidly progressive HCC.  Plan:  - Follow-up either in person or telemedicine consultation following acquisition of CTA of the abdomen and pelvis (to evaluate hepatic arterial anatomy) and right upper quadrant abdominal ultrasound (to determine the sonographic visualization of the known liver lesion). - Note the patient's LFTs are normal andhave remained stable for some time and therefore I do not feel need to be repeated. - Ultimately, the bland embolization and microwave ablation will be performed at [facility] with general anesthesia.  I will be assisted for this procedure by my interventional radiology partner, Dr. Archer Asa.  The patient will be admitted to  the hospital for overnight observation and PCA usage.  Thank you for this interesting consult.  I  greatly enjoyed meeting [name] and look forward to participating in their care.  A copy of this report was sent to the requesting provider on this date.  Electronically Signed: Simonne Come 06/03/2022, 9:19 AM   I spent a total of 40 Minutes in face to face in clinical consultation, greater than 50% of which was counseling/coordinating care for [facility].

## 2022-06-09 NOTE — Progress Notes (Signed)
Office Visit Note  Patient: [name]             Date of Birth: 05-08-1950           MRN: [id number]             PCP: Ralene Ok, MD Referring: Ralene Ok, MD Visit Date: 06/23/2022 Occupation: @GUAROCC @  Subjective:  Joint stiffness  History of Present Illness: [name] is a 72 y.o. female with history of positive ANA and osteoarthritis.  She returns today after her last visit in December 2023.  Patient denies any history of oral ulcers, nasal ulcers, malar rash, full sensitivity, Raynaud's phenomenon or lymphadenopathy.  She states she always had some sensitivity due to medication use.  She had very good response to physical therapy.  She feels more strength in her extremities.  She is unable to get up from the chair without using the armrests.  She states that her gait has become more stable.  She continues to have some discomfort in her right knee joint was replaced.  She has stiffness in her hands.  She does not have much muscle stiffness in her neck.  She denies increased shortness of breath.  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  Her disease is in remission.  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  She has stage Ib carcinoma.  She is not a surgical candidate.  She will be undergoing microwave ablation and embolectomy.  She is a candidate for liver transplant.    Activities of Daily Living:  Patient reports morning stiffness for all day. Patient Reports nocturnal pain.  Difficulty dressing/grooming: Denies Difficulty climbing stairs: Reports Difficulty getting out of chair: Denies Difficulty using hands for taps, buttons, cutlery, and/or writing: Denies  Review of Systems  Constitutional:  Positive for fatigue.  HENT:  Negative for mouth sores and mouth dryness.   Eyes:  Positive for dryness.  Respiratory:  Negative for difficulty breathing.   Cardiovascular:  Negative for chest pain and palpitations.  Gastrointestinal:   Positive for blood in stool. Negative for constipation and diarrhea.       Occasional, due to constipation per patient  Endocrine: Negative for increased urination.  Genitourinary:  Negative for involuntary urination.  Musculoskeletal:  Positive for joint pain, joint pain and morning stiffness. Negative for gait problem, joint swelling, myalgias, muscle weakness, muscle tenderness and myalgias.  Skin:  Positive for sensitivity to sunlight. Negative for color change, rash and hair loss.  Allergic/Immunologic: Negative for susceptible to infections.  Neurological:  Negative for dizziness and headaches.  Hematological:  Negative for swollen glands.  Psychiatric/Behavioral:  Negative for depressed mood and sleep disturbance. The patient is nervous/anxious.     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   Chest pain 09/23/2020   Thrombocytopenia (HCC) 09/23/2020   Dehydration 06/22/2012   AKI (acute kidney injury) (HCC) 06/22/2012   Hypothyroidism 06/22/2012   HTN (hypertension) 06/22/2012   Uncontrolled type 2 diabetes mellitus with hyperglycemia (HCC) 06/22/2012   Sarcoidosis 06/22/2012   Heart murmur 06/22/2012   UTI (urinary tract infection) 06/22/2012    Past Medical History:  Diagnosis Date   Arthritis  Asthma    CAD (coronary artery disease)    CHF (congestive heart failure) (HCC)    Complication of anesthesia    trouble waking up once   COPD (chronic obstructive pulmonary disease) (HCC)    Diabetes mellitus without complication (HCC)    Dysrhythmia    Heart murmur    Hypertension    Hypothyroidism    Liver disease    NAFLD cirrhosis   Mild aortic stenosis    Pneumonia     Sarcoidosis    Sarcoidosis of lung (HCC)    Sarcoidosis of lymph nodes     Family History  Problem Relation Age of Onset   Diabetes Mother    Heart attack Mother    Heart failure Father    Hypertension Father    Heart attack Father    Heart Problems Sister    Blindness Sister    Diabetes Sister    Hypertension Sister    Heart attack Brother    Heart attack Brother    Throat cancer Brother    Healthy Son    Rheum arthritis Daughter    Past Surgical History:  Procedure Laterality Date   ABDOMINAL HYSTERECTOMY     ABDOMINAL SURGERY     CHOLECYSTECTOMY     ESOPHAGOGASTRODUODENOSCOPY (EGD) WITH PROPOFOL N/A 04/17/2021   Procedure: ESOPHAGOGASTRODUODENOSCOPY (EGD) WITH PROPOFOL;  Surgeon: Jeani Hawking, MD;  Location: [facility];  Service: Gastroenterology;  Laterality: N/A;   HERNIA REPAIR     LASIK  2001   LEFT HEART CATH AND CORONARY ANGIOGRAPHY N/A 09/23/2020   Procedure: LEFT HEART CATH AND CORONARY ANGIOGRAPHY;  Surgeon: Lennette Bihari, MD;  Location: [facility];  Service: Cardiovascular;  Laterality: N/A;   nissenfundiplication     TOTAL KNEE ARTHROPLASTY Right 05/19/2021   Procedure: TOTAL KNEE ARTHROPLASTY;  Surgeon: Durene Romans, MD;  Location: [facility];  Service: Orthopedics;  Laterality: Right;   Social History   Social History Narrative   Not on file   Immunization History  Administered Date(s) Administered   Fluad Quad(high Dose 65+) 11/23/2020     Objective: Vital Signs: BP 124/64 (BP Location: Left Arm, Patient Position: Sitting, Cuff Size: Normal)   Pulse 60   Resp 15   Ht 5\' 2"  (1.575 m)   Wt 179 lb 9.6 oz (81.5 kg)   BMI 32.85 kg/m    Physical Exam Vitals and nursing note reviewed.  Constitutional:      Appearance: She is well-developed.  HENT:     Head: Normocephalic and atraumatic.  Eyes:     Conjunctiva/sclera: Conjunctivae normal.  Cardiovascular:     Rate and Rhythm: Normal rate and regular rhythm.     Heart sounds:  Murmur heard.  Pulmonary:     Effort: Pulmonary effort is normal.     Breath sounds: Normal breath sounds.  Abdominal:     General: Bowel sounds are normal.     Palpations: Abdomen is soft.  Musculoskeletal:     Cervical back: Normal range of motion.  Lymphadenopathy:     Cervical: No cervical adenopathy.  Skin:    General: Skin is warm and dry.     Capillary Refill: Capillary refill takes less than 2 seconds.  Neurological:     Mental Status: She is alert and oriented to person, place, and time.  Psychiatric:        Behavior: Behavior normal.      Musculoskeletal Exam: She had limited range of motion of the cervical spine without  discomfort.  She had no tenderness over thoracic or lumbar spine.  Shoulders, elbows, wrist joints, MCPs PIPs and DIPs were in good range of motion.  She had bilateral PIP and DIP thickening with no synovitis.  Hip joints were in good range of motion.  Right knee joint was replaced with limited extension.  Left knee joint was in good range of motion.  No warmth swelling or effusion was noted.  There was no tenderness over ankles or MTPs.  She had no difficulty getting up from the chair without using the armrests.  CDAI Exam: CDAI Score: -- Patient Global: --; Provider Global: -- Swollen: --; Tender: -- Joint Exam 06/23/2022   No joint exam has been documented for this visit   There is currently no information documented on the homunculus. Go to the Rheumatology activity and complete the homunculus joint exam.  Investigation: No additional findings.  Imaging: CT Angio Abd/Pel w/ and/or w/o  Result Date: 06/16/2022 CLINICAL DATA:  72 year old female with history of cirrhosis and hepatocellular carcinoma. EXAM: CT ANGIOGRAPHY ABDOMEN AND PELVIS WITH CONTRAST AND WITHOUT CONTRAST TECHNIQUE: Multidetector CT imaging of the abdomen and pelvis was performed using the standard protocol during bolus administration of intravenous contrast. Multiplanar  reconstructed images and MIPs were obtained and reviewed to evaluate the vascular anatomy. RADIATION DOSE REDUCTION: This exam was performed according to the departmental dose-optimization program which includes automated exposure control, adjustment of the mA and/or kV according to patient size and/or use of iterative reconstruction technique. CONTRAST:  75mL ISOVUE-370 IOPAMIDOL (ISOVUE-370) INJECTION 76% COMPARISON:  04/22/2022, 09/25/2021 FINDINGS: VASCULAR Aorta: Normal caliber aorta without aneurysm, dissection, vasculitis or significant stenosis. Scattered atherosclerotic calcifications, most prominent in the renal segment. Celiac: Mild ostial stenosis secondary to calcified atherosclerotic plaque. Otherwise patent with conventional anatomy. SMA: Patent without evidence of aneurysm, dissection, vasculitis or significant stenosis. Renals: Single bilateral renal arteries are patent without evidence of aneurysm, dissection, vasculitis, fibromuscular dysplasia or significant stenosis. IMA: Patent without evidence of aneurysm, dissection, vasculitis or significant stenosis. Inflow: Patent without evidence of aneurysm, dissection, vasculitis or significant stenosis. Proximal Outflow: Bilateral common femoral and visualized portions of the superficial and profunda femoral arteries are patent without evidence of aneurysm, dissection, vasculitis or significant stenosis. Veins: The hepatic veins are widely patent. The portal system is widely patent and normal in caliber. The renal veins are patent bilaterally in standard anatomic configuration. No evidence of iliocaval thrombosis or anomaly. Review of the MIP images confirms the above findings. NON-VASCULAR Lower chest: No acute abnormality.  Moderate global cardiomegaly. Hepatobiliary: Similar appearing nodular contour is suggestive and is of the hepatic parenchyma with left lobe hypertrophy. Within segment 8 of the liver is an arterial enhancing mass measuring  approximately 2.4 x 2.8 x 2.6 cm (AP by trans by cc) with evidence of washout on venous phase. This is again compatible with a LI-RADS 5 mass. No additional hepatoma identified. The gallbladder surgically absent. No intra or extrahepatic biliary ductal dilation. Pancreas: Unremarkable. No pancreatic ductal dilatation or surrounding inflammatory changes. Spleen: Normal in size without focal abnormality. Adrenals/Urinary Tract: Adrenal glands are unremarkable. Extrarenal pelves bilaterally. Kidneys are otherwise normal, without renal calculi, focal lesion, or hydronephrosis. Bladder is unremarkable. Stomach/Bowel: Postsurgical changes after Nissen fundoplication unchanged small hiatal hernia. The stomach is otherwise within normal limits. Appendix appears normal. No evidence of bowel wall thickening, distention, or inflammatory changes. Lymphatic: No abdominopelvic lymphadenopathy. Reproductive: Status post hysterectomy. No adnexal masses. Other: Tiny fat containing umbilical hernia.  No ascites. Musculoskeletal:  No acute or significant osseous findings. IMPRESSION: VASCULAR 1. Moderate global cardiomegaly. 2.  Aortic Atherosclerosis (ICD10-I70.0). NON-VASCULAR 1. Similar appearing unifocal hepatocellular carcinoma (LI-RADS category 5) in hepatic segment 8 measuring up to 2.8 cm. No new hepatoma, regional lymphadenopathy, or evidence of metastasis. 2. Morphologic changes of hepatic cirrhosis without secondary signs of portal hypertension. Marliss Coots, MD [facility] Electronically Signed   By: Marliss Coots M.D.   On: 06/16/2022 15:56   US Abdomen Limited RUQ (LIVER/GB)  Result Date: 06/16/2022 CLINICAL DATA:  Liver lesion EXAM: ULTRASOUND ABDOMEN LIMITED RIGHT UPPER QUADRANT COMPARISON:  MRI of the abdomen April 22, 2022 FINDINGS: Gallbladder: Previous cholecystectomy. Common bile duct: Diameter: 3 mm Liver: The known right hepatic lobe mass concerning  for a hepatocellular carcinoma measures 3.5 x 2.5 x 2.7 cm today versus 3.1 x 3.1 cm on the April 22, 2022 MRI of the abdomen. Several other hypoechoic masslike regions are identified such as on image 11 in the left hepatic lobe measuring 6 and 8 mm. Nodular contour. Heterogeneous echogenicity. Portal vein is patent on color Doppler imaging with normal direction of blood flow towards the liver. Other: None. IMPRESSION: 1. The suspected hepatocellular carcinoma identified on the MRI of the abdomen April 22, 2022 is visualized on today's study. The mass measures 3.5 x 2.5 x 2.7 cm today versus 3.1 x 3.1 cm April 22, 2022. The small difference in size could be technical. 2. Two hypoechoic masses in the left hepatic lobe measuring 6 and 8 mm were not visualized on the recent MRI. MRI imaging could better evaluate. Alternatively, recommend attention on follow-up. 3. Cirrhotic liver. 4. No other abnormalities. Electronically Signed   By: Gerome Sam III M.D.   On: 06/16/2022 11:18    Recent Labs: Lab Results  Component Value Date   WBC 4.4 04/30/2022   HGB 12.2 04/30/2022   PLT 98 (L) 04/30/2022   NA 140 04/30/2022   K 4.4 04/30/2022   CL 109 04/30/2022   CO2 27 04/30/2022   GLUCOSE 90 04/30/2022   BUN 12 04/30/2022   CREATININE 0.72 04/30/2022   BILITOT 0.9 04/30/2022   ALKPHOS 109 04/30/2022   AST 56 (H) 04/30/2022   ALT 46 (H) 04/30/2022   PROT 7.6 04/30/2022   ALBUMIN 3.8 04/30/2022   CALCIUM 10.0 04/30/2022   GFRAA >60 02/07/2018    Speciality Comments: No specialty comments available.  Procedures:  No procedures performed Allergies: Sulfa antibiotics, Atorvastatin, and Amoxicillin   Assessment / Plan:     Visit Diagnoses: Polyarthralgia -she has noticed improvement in her generalized joint pain since she went for physical therapy.  She still continues to have some stiffness.  She denies any history of joint swelling.  Primary osteoarthritis of both hands -she had bilateral PIP and  DIP thickening.  She continues to have some stiffness in her hands.  She denies any history of joint swelling.  No synovitis was noted.  X-rays are consistent with osteoarthritis and generalized osteopenia.  S/P total knee arthroplasty, right -she continues to experience chronic pain in her right knee.  She had right total knee replacement on May 19, 2021 by Dr. Charlann Boxer.  Primary osteoarthritis of left knee - History of chronic pain in her left knee joint.  Patient had good response to physical therapy.  She has been doing exercises at home.  She has noticed improvement in her strength in bilateral knee joints.  Arthropathy of cervical facet joint-she had limited lateral  rotation.  She states her neck discomfort is improved since she has been doing stretching exercises.  Positive ANA (antinuclear antibody) - Significantly high titer of ANA.  Most likely related to liver disease.  ENA negative, C3-C4 normal.  There is no history of oral ulcers, nasal ulcers, malar rash, photosensitivity, Raynaud's.  I advised her to contact us in case she develops any new symptoms.  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  Patient had no pulmonary symptoms.  Chest x-ray was normal.  She had a chest CT on April 13, 2022 which showed no acute findings.  She had a stable mediastinal and hilar lymphadenopathy consistent with sarcoidosis.  ACE level states elevated.  She was followed by Dr. Celine Mans.  She was evaluated by Dr. Celine Mans on Jun 15, 2022.  She had normal PFTs.  No change in treatment advised.  Heart murmur - Aortic stenosis and moderate mitral regurgitation.  Systolic anterior movement of mitral valve  Diastolic dysfunction -followed by by Dr. Cristal Deer  Primary hypertension-blood pressure was normal as 126/64.  Thrombocytopenia (HCC) -  She was referred to hematology.  Dr. Leonides Schanz evaluated her on December 16, 2021.  According to his note that thrombocytopenia was related to cirrhosis.  Hepatocellular  carcinoma stage Ib-diagnosed April 2024.She is a scheduled for microwave ablation and embolization. She will need liver transplant in the future.   Dyslipidemia  Other cirrhosis of liver (HCC) - Followed by Dr. Loreta Ave  Uncontrolled type 2 diabetes mellitus with hyperglycemia (HCC)  History of hiatal hernia  History of hypothyroidism  Orders: No orders of the defined types were placed in this encounter.  No orders of the defined types were placed in this encounter.    Follow-Up Instructions: Return in about 1 year (around 06/23/2023) for +ANA, OA, Sarcoidosis.   Pollyann Savoy, MD  Note - This record has been created using Animal nutritionist.  Chart creation errors have been sought, but may not always  have been located. Such creation errors do not reflect on  the standard of medical care.

## 2022-06-15 ENCOUNTER — Ambulatory Visit (INDEPENDENT_AMBULATORY_CARE_PROVIDER_SITE_OTHER): Payer: Medicare Other | Admitting: Internal Medicine

## 2022-06-15 ENCOUNTER — Encounter: Payer: Self-pay | Admitting: Internal Medicine

## 2022-06-15 VITALS — BP 130/70 | HR 66 | Temp 97.9°F | Ht 62.5 in | Wt 179.0 lb

## 2022-06-15 DIAGNOSIS — J301 Allergic rhinitis due to pollen: Secondary | ICD-10-CM | POA: Diagnosis not present

## 2022-06-15 DIAGNOSIS — D860 Sarcoidosis of lung: Secondary | ICD-10-CM | POA: Diagnosis not present

## 2022-06-15 MED ORDER — FLUTICASONE PROPIONATE 50 MCG/ACT NA SUSP: 1.0000 | Freq: Every day | NASAL | 11 refills | Status: AC

## 2022-06-15 MED ORDER — AZELASTINE HCL 0.1 % NA SOLN: 1.0000 | Freq: Two times a day (BID) | NASAL | 12 refills | Status: DC

## 2022-06-15 NOTE — Progress Notes (Signed)
[name]    [id number]    02-04-1950  Primary Care Physician:Moreira, Channing Mutters, MD Date of Appointment: 06/15/2022 Established Patient Visit  Chief complaint:   Chief Complaint  Patient presents with   Follow-up    Pt states she has not had any complaints with her breathing since last visit.     HPI: [name] is a 72 y.o. 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 She also has cirrhosis and sees Dr. Loreta Ave.  Interval Updates: Here for follow up after PFTs. She has been diagnosed with HCC. Due to have chemotherapy embolization tomorrow. She is also seeing liver transplant team.   Minimal albuterol use.   No breathing issues since I saw her two months ago.  No skin changes. No heart problems.   She is having poorly controlled rhinitis and allergies despite daily zyrtec.   I have reviewed the patient's family social and past medical history and updated as appropriate.   Past Medical History:  Diagnosis Date   Arthritis    Asthma    CAD (coronary artery disease)    CHF (congestive heart failure) (HCC)    Complication of anesthesia    trouble waking up once   COPD (chronic obstructive pulmonary disease) (HCC)    Diabetes mellitus without complication (HCC)    Dysrhythmia    Heart murmur    Hypertension    Hypothyroidism    Liver disease    NAFLD cirrhosis   Mild aortic stenosis    Pneumonia    Sarcoidosis    Sarcoidosis of lung (HCC)    Sarcoidosis of lymph nodes     Past Surgical History:  Procedure Laterality Date   ABDOMINAL HYSTERECTOMY     ABDOMINAL SURGERY     CHOLECYSTECTOMY     ESOPHAGOGASTRODUODENOSCOPY (EGD) WITH PROPOFOL N/A 04/17/2021   Procedure: ESOPHAGOGASTRODUODENOSCOPY (EGD) WITH PROPOFOL;  Surgeon: Jeani Hawking, MD;  Location: [facility];  Service: Gastroenterology;  Laterality: N/A;   HERNIA REPAIR     LASIK  2001   LEFT HEART CATH AND CORONARY ANGIOGRAPHY N/A 09/23/2020   Procedure: LEFT  HEART CATH AND CORONARY ANGIOGRAPHY;  Surgeon: Lennette Bihari, MD;  Location: [facility];  Service: Cardiovascular;  Laterality: N/A;   nissenfundiplication     TOTAL KNEE ARTHROPLASTY Right 05/19/2021   Procedure: TOTAL KNEE ARTHROPLASTY;  Surgeon: Durene Romans, MD;  Location: [facility];  Service: Orthopedics;  Laterality: Right;    Family History  Problem Relation Age of Onset   Diabetes Mother    Heart attack Mother    Heart failure Father    Hypertension Father    Heart attack Father    Heart Problems Sister    Blindness Sister    Diabetes Sister    Hypertension Sister    Heart attack Brother    Heart attack Brother    Throat cancer Brother    Healthy Son    Rheum arthritis Daughter     Social History   Occupational History   Not on file  Tobacco Use   Smoking status: Never    Passive exposure: Past (minimal)   Smokeless tobacco: Never  Vaping Use   Vaping Use: Never used  Substance and Sexual Activity   Alcohol use: No   Drug use: No   Sexual activity: Not Currently     Physical Exam: Blood pressure 130/70, pulse 66, temperature 97.9 F (36.6 C), temperature source Oral, height  5' 2.5" (1.588 m), weight 179 lb (81.2 kg), SpO2 99 %.  Gen:      NAD ENT: watery eyes Lungs:   ctab no wheezes or crackles CV:         RRR  Data Reviewed: Imaging: I have personally reviewed the CT Chest Feb 2022 which shows mild peribronchial thickening and bronchiectasis changes, no focal consolidation, mild ggos which are improving from previous study.  CTPe study Sept 2023 - negative for PE no acute pulmonary process  EKG reviewed 09/2021 no IV conduction delay, NSR  PFTs:     Latest Ref Rng & Units 04/14/2022    1:13 PM  PFT Results  FVC-Pre L 2.01   FVC-Predicted Pre % 75   FVC-Post L 2.37   FVC-Predicted Post % 89   Pre FEV1/FVC % % 85   Post FEV1/FCV % % 83   FEV1-Pre L 1.71   FEV1-Predicted Pre % 86   FEV1-Post L 1.97   DLCO uncorrected ml/min/mmHg  16.18   DLCO UNC% % 90   DLCO corrected ml/min/mmHg 16.18   DLCO COR %Predicted % 90   DLVA Predicted % 110   TLC L 3.84   TLC % Predicted % 80   RV % Predicted % 73    I have personally reviewed the patient's PFTs which show normal pulmonary function  Labs: ANA 1:1280 with nuclear homogenous pattern ACE 100 Paraesophageal lymph node with non-necrotizing granulomas. Done in 2010  Immunization status: Immunization History  Administered Date(s) Administered   Fluad Quad(high Dose 65+) 11/23/2020    External Records Personally Reviewed: ED, pulmonary , PCP  Assessment:  Pulmonary Sarcoidosis Stage 1, essentially clinically quiescent.  Allergic rhinitis, not well controlled   Plan/Recommendations:  Pulmonary function is normal. Will monitor annually for markers of disease. She has no contraindications to advanced therapies if indicated from a liver perspective, nor does she require any further perioperative evaluation for surgeries.  Continue prn albuterol for now.  Switch to another anti-histamine like xyzal or claritin.  Will add astelin nasal spray with flonase for uncontrolled rhinitis   Return to Care: Return in about 1 year (around 06/15/2023).   Durel Salts, MD Pulmonary [facility] Care [facility] Office:[phone]

## 2022-06-15 NOTE — Patient Instructions (Addendum)
Please schedule follow up scheduled with myself in 1 year.  If my schedule is not open yet, we will contact you with a reminder closer to that time. Please call [phone] if you haven't heard from [place] a month before.   Flonase - 1 spray on each side of your nose twice a day for first week, then 1 spray on each side. Take flonase in the morning, astelin at night  Instructions for use: If you also use a saline nasal spray or rinse, use that first. Position the head with the chin slightly tucked. Use the right hand to spray into the left nostril and the right hand to spray into the left nostril.   Point the bottle away from the septum of your nose (cartilage that divides the two sides of your nose).  Hold the nostril closed on the opposite side from where you will spray Spray once and gently sniff to pull the medicine into the higher parts of your nose.  Don't sniff too hard as the medicine will drain down the back of your throat instead. Repeat with a second spray on the same side if prescribed. Repeat on the other side of your nose.

## 2022-06-16 ENCOUNTER — Ambulatory Visit
Admission: RE | Admit: 2022-06-16 | Discharge: 2022-06-16 | Disposition: A | Discharge status: Home / Self Care | Payer: Medicare Other | Source: Ambulatory Visit | Attending: Interventional Radiology | Admitting: Interventional Radiology

## 2022-06-16 DIAGNOSIS — K769 Liver disease, unspecified: Secondary | ICD-10-CM

## 2022-06-16 MED ORDER — IOPAMIDOL (ISOVUE-370) INJECTION 76%
75.0000 mL | Freq: Once | INTRAVENOUS | Status: AC | PRN
  Administered 2022-06-16: 75 mL via INTRAVENOUS

## 2022-06-21 ENCOUNTER — Other Ambulatory Visit: Payer: Self-pay | Admitting: Interventional Radiology

## 2022-06-21 DIAGNOSIS — C220 Liver cell carcinoma: Secondary | ICD-10-CM

## 2022-06-21 DIAGNOSIS — K769 Liver disease, unspecified: Secondary | ICD-10-CM

## 2022-06-23 ENCOUNTER — Encounter (HOSPITAL_COMMUNITY): Payer: Self-pay

## 2022-06-23 ENCOUNTER — Encounter: Payer: Self-pay | Admitting: Rheumatology

## 2022-06-23 ENCOUNTER — Ambulatory Visit: Payer: Medicare Other | Attending: Rheumatology | Admitting: Rheumatology

## 2022-06-23 VITALS — BP 124/64 | HR 60 | Resp 15 | Ht 62.0 in | Wt 179.6 lb

## 2022-06-23 DIAGNOSIS — Z96651 Presence of right artificial knee joint: Secondary | ICD-10-CM

## 2022-06-23 DIAGNOSIS — M1712 Unilateral primary osteoarthritis, left knee: Secondary | ICD-10-CM

## 2022-06-23 DIAGNOSIS — D869 Sarcoidosis, unspecified: Secondary | ICD-10-CM

## 2022-06-23 DIAGNOSIS — I10 Essential (primary) hypertension: Secondary | ICD-10-CM

## 2022-06-23 DIAGNOSIS — I3489 Other nonrheumatic mitral valve disorders: Secondary | ICD-10-CM

## 2022-06-23 DIAGNOSIS — R768 Other specified abnormal immunological findings in serum: Secondary | ICD-10-CM

## 2022-06-23 DIAGNOSIS — M47812 Spondylosis without myelopathy or radiculopathy, cervical region: Secondary | ICD-10-CM

## 2022-06-23 DIAGNOSIS — I5189 Other ill-defined heart diseases: Secondary | ICD-10-CM

## 2022-06-23 DIAGNOSIS — C220 Liver cell carcinoma: Secondary | ICD-10-CM

## 2022-06-23 DIAGNOSIS — M19041 Primary osteoarthritis, right hand: Secondary | ICD-10-CM

## 2022-06-23 DIAGNOSIS — Z8639 Personal history of other endocrine, nutritional and metabolic disease: Secondary | ICD-10-CM

## 2022-06-23 DIAGNOSIS — D696 Thrombocytopenia, unspecified: Secondary | ICD-10-CM

## 2022-06-23 DIAGNOSIS — E1165 Type 2 diabetes mellitus with hyperglycemia: Secondary | ICD-10-CM

## 2022-06-23 DIAGNOSIS — E785 Hyperlipidemia, unspecified: Secondary | ICD-10-CM

## 2022-06-23 DIAGNOSIS — M2550 Pain in unspecified joint: Secondary | ICD-10-CM

## 2022-06-23 DIAGNOSIS — Z8719 Personal history of other diseases of the digestive system: Secondary | ICD-10-CM

## 2022-06-23 DIAGNOSIS — M791 Myalgia, unspecified site: Secondary | ICD-10-CM

## 2022-06-23 DIAGNOSIS — R2681 Unsteadiness on feet: Secondary | ICD-10-CM

## 2022-06-23 DIAGNOSIS — R011 Cardiac murmur, unspecified: Secondary | ICD-10-CM

## 2022-06-23 DIAGNOSIS — M19042 Primary osteoarthritis, left hand: Secondary | ICD-10-CM

## 2022-06-23 DIAGNOSIS — M79642 Pain in left hand: Secondary | ICD-10-CM

## 2022-06-23 DIAGNOSIS — K7469 Other cirrhosis of liver: Secondary | ICD-10-CM

## 2022-06-24 ENCOUNTER — Other Ambulatory Visit: Payer: Self-pay | Admitting: Radiology

## 2022-06-24 DIAGNOSIS — R160 Hepatomegaly, not elsewhere classified: Secondary | ICD-10-CM

## 2022-06-24 NOTE — Progress Notes (Addendum)
COVID Vaccine received:  []  No [x]  Yes Date of any COVID positive Test in last 90 days:  none  PCP - Tery Sanfilippo MD Cardiologist - Jodelle Red, MD Rheumatology-  Pollyann Savoy, MD  Chest x-ray -  EKG -  04-15-2022   Epic Stress Test -  ECHO - 09-23-20 Epic Cardiac Cath - 09-23-20  [facility]  by Dr Nicki Guadalajara   Epic MR Cardiac Morph.- 09-25-2020  Epic  PCR screen: []  Ordered & Completed           []   No Order but Needs PROFEND           [x]   N/A for this surgery  Surgery Plan:  []  Ambulatory                            [x]  Outpatient in bed                            []  Admit  Anesthesia:    [x]  General  []  Spinal                           []   Choice []   MAC  Bowel Prep - [x]  No  []   Yes ______  Pacemaker / ICD device [x]  No []  Yes   Spinal Cord Stimulator:[x]  No []  Yes       History of Sleep Apnea? [x]  No []  Yes   CPAP used?- [x]  No []  Yes    Does the patient monitor blood sugar?          []  No [x]  Yes  []  N/A  Patient has: []  NO Hx DM   []  Pre-DM                 []  DM1  [x]   DM2 Does patient have a [employer] or Dexacom? [x]  No []  Yes   Fasting Blood Sugar Ranges- 100-130 Checks Blood Sugar _3__ times a week  Blood Thinner / Instructions:  none  Aspirin Instructions:  ASA 81 mg   ERAS Protocol Ordered: [x]  No  []  Yes Patient is to be NPO after: midnight prio  Activity level: Patient is unable to climb a flight of stairs without difficulty;  Patient can perform ADLs without assistance.   PATIENT WILL SIGN HER SURGERY CONSENT FORM THE DOS.  Anesthesia review: HTN, CAD, sarcoidosis, NASH cirrhosis, Pre-DM,Heart murmur- mild AS-  Moderate MR,CHF,  Thrombocytopenia, asthma  Patient denies shortness of breath, fever, cough and chest pain at PAT appointment.  Patient verbalized understanding and agreement to the Pre-Surgical Instructions that were given to them at this PAT appointment. Patient was also educated of the need to review these PAT instructions  again prior to her surgery.I reviewed the appropriate phone numbers to call if they have any and questions or concerns.

## 2022-06-24 NOTE — Patient Instructions (Addendum)
SURGICAL WAITING ROOM VISITATION Patients having surgery or a procedure may have no more than 2 support people in the waiting area - these visitors may rotate in the visitor waiting room.   Due to an increase in RSV and influenza rates and associated hospitalizations, children ages 34 and under may not visit patients in [facility] hospitals. If the patient needs to stay at the hospital during part of their recovery, the visitor guidelines for inpatient rooms apply.  PRE-OP VISITATION  Pre-op nurse will coordinate an appropriate time for 1 support person to accompany the patient in pre-op.  This support person may not rotate.  This visitor will be contacted when the time is appropriate for the visitor to come back in the pre-op area.  Please refer to the [facility] website for the visitor guidelines for Inpatients (after your surgery is over and you are in a regular room).  You are not required to quarantine at this time prior to your surgery. However, you must do this: Hand Hygiene often Do NOT share personal items Notify your provider if you are in close contact with someone who has COVID or you develop fever 100.4 or greater, new onset of sneezing, cough, sore throat, shortness of breath or body aches.  If you test positive for Covid or have been in contact with anyone that has tested positive in the last 10 days please notify you surgeon.    Your procedure is scheduled on:  Wednesday  July 21, 2022  Report to [facility] Main Entrance: [facility] Jacobsen entrance where the [employer] is available.   Report to admitting at:  06:30   AM  +++++Call this number if you have any questions or problems the morning of surgery [phone]  DO NOT EAT OR DRINK ANYTHING AFTER MIDNIGHT THE NIGHT PRIOR TO YOUR SURGERY / PROCEDURE.    FOLLOW BOWEL PREP AND ANY ADDITIONAL PRE OP INSTRUCTIONS YOU RECEIVED FROM YOUR SURGEON'S OFFICE!!!   Oral Hygiene is also important to reduce your risk of  infection.        Remember - BRUSH YOUR TEETH THE MORNING OF SURGERY WITH YOUR REGULAR TOOTHPASTE  Do NOT smoke after Midnight the night before surgery.  Take ONLY these medicines the morning of surgery with A SIP OF WATER: Levothyroxine, amlodipine, carvedilol, gabapentin, You may use your Flonase nasal spray and Albuterol inhaler. You may take Tylenol or Tramadol if needed for pain. You may take Xanax if needed for anxiety.                    You may not have any metal on your body including hair pins, jewelry, and body piercing  Do not wear make-up, lotions, powders, perfumes or deodorant  Do not wear nail polish including gel and S&S, artificial / acrylic nails, or any other type of covering on natural nails including finger and toenails. If you have artificial nails, gel coating, etc., that needs to be removed by a nail salon, Please have this removed prior to surgery. Not doing so may mean that your surgery could be cancelled or delayed if the Surgeon or anesthesia staff feels like they are unable to monitor you safely.   Do not shave 48 hours prior to surgery to avoid nicks in your skin which may contribute to postoperative infections.    You may bring a small overnight bag with you on the day of surgery, only pack items that are not valuable. [facility] IS NOT RESPONSIBLE  FOR VALUABLES THAT ARE LOST OR STOLEN.   Do not bring your home medications to the hospital. The Pharmacy will dispense medications listed on your medication list to you during your admission in the Hospital.  Please read over the following fact sheets you were given: IF YOU HAVE QUESTIONS ABOUT YOUR PRE-OP INSTRUCTIONS, PLEASE CALL [phone].   [facility] - Preparing for Surgery Before surgery, you can play an important role.  Because skin is not sterile, your skin needs to be as free of germs as possible.  You can reduce the number of germs on your skin by washing with CHG (chlorahexidine gluconate) soap  before surgery.  CHG is an antiseptic cleaner which kills germs and bonds with the skin to continue killing germs even after washing. Please DO NOT use if you have an allergy to CHG or antibacterial soaps.  If your skin becomes reddened/irritated stop using the CHG and inform your nurse when you arrive at Short Stay. Do not shave (including legs and underarms) for at least 48 hours prior to the first CHG shower.  You may shave your face/neck.  Please follow these instructions carefully:  1.  Shower with CHG Soap the night before surgery and the  morning of surgery.  2.  If you choose to wash your hair, wash your hair first as usual with your normal  shampoo.  3.  After you shampoo, rinse your hair and body thoroughly to remove the shampoo.                             4.  Use CHG as you would any other liquid soap.  You can apply chg directly to the skin and wash.  Gently with a scrungie or clean washcloth.  5.  Apply the CHG Soap to your body ONLY FROM THE NECK DOWN.   Do not use on face/ open                           Wound or open sores. Avoid contact with eyes, ears mouth and genitals (private parts).                       Wash face,  Genitals (private parts) with your normal soap.             6.  Wash thoroughly, paying special attention to the area where your  surgery  will be performed.  7.  Thoroughly rinse your body with warm water from the neck down.  8.  DO NOT shower/wash with your normal soap after using and rinsing off the CHG Soap.            9.  Pat yourself dry with a clean towel.            10.  Wear clean pajamas.            11.  Place clean sheets on your bed the night of your first shower and do not  sleep with pets.  ON THE DAY OF SURGERY : Do not apply any lotions/deodorants the morning of surgery.  Please wear clean clothes to the hospital/surgery center.    FAILURE TO FOLLOW THESE INSTRUCTIONS MAY RESULT IN THE CANCELLATION OF YOUR SURGERY  PATIENT  SIGNATURE_________________________________  NURSE SIGNATURE__________________________________  ________________________________________________________________________

## 2022-06-24 NOTE — Progress Notes (Signed)
Surgery orders requested with radiology PA. 

## 2022-06-25 ENCOUNTER — Telehealth: Payer: Self-pay | Admitting: Hematology and Oncology

## 2022-06-30 ENCOUNTER — Ambulatory Visit (HOSPITAL_COMMUNITY)
Admission: RE | Admit: 2022-06-30 | Discharge: 2022-06-30 | Disposition: A | Discharge status: Home / Self Care | Payer: Medicare Other | Source: Ambulatory Visit | Attending: Radiology | Admitting: Radiology

## 2022-06-30 ENCOUNTER — Other Ambulatory Visit: Payer: Self-pay

## 2022-06-30 ENCOUNTER — Encounter (HOSPITAL_COMMUNITY): Payer: Self-pay

## 2022-06-30 ENCOUNTER — Encounter (HOSPITAL_COMMUNITY)
Admission: RE | Admit: 2022-06-30 | Discharge: 2022-06-30 | Disposition: A | Discharge status: Home / Self Care | Payer: Medicare Other | Source: Ambulatory Visit | Attending: Interventional Radiology | Admitting: Interventional Radiology

## 2022-06-30 VITALS — BP 134/58 | HR 61 | Temp 97.9°F | Resp 16 | Ht 62.0 in | Wt 178.0 lb

## 2022-06-30 DIAGNOSIS — Z01818 Encounter for other preprocedural examination: Secondary | ICD-10-CM | POA: Diagnosis present

## 2022-06-30 DIAGNOSIS — R7303 Prediabetes: Secondary | ICD-10-CM | POA: Diagnosis not present

## 2022-06-30 DIAGNOSIS — R160 Hepatomegaly, not elsewhere classified: Secondary | ICD-10-CM | POA: Diagnosis not present

## 2022-06-30 HISTORY — DX: Depression, unspecified: F32.A

## 2022-06-30 HISTORY — DX: Disease of blood and blood-forming organs, unspecified: D75.9

## 2022-06-30 HISTORY — DX: Anxiety disorder, unspecified: F41.9

## 2022-06-30 LAB — COMPREHENSIVE METABOLIC PANEL
ALT: 69 U/L — ABNORMAL HIGH (ref 0–44)
AST: 55 U/L — ABNORMAL HIGH (ref 15–41)
Albumin: 3.7 g/dL (ref 3.5–5.0)
Alkaline Phosphatase: 123 U/L (ref 38–126)
Anion gap: 8 (ref 5–15)
BUN: 26 mg/dL — ABNORMAL HIGH (ref 8–23)
CO2: 22 mmol/L (ref 22–32)
Calcium: 9.7 mg/dL (ref 8.9–10.3)
Chloride: 107 mmol/L (ref 98–111)
Creatinine, Ser: 0.64 mg/dL (ref 0.44–1.00)
GFR, Estimated: >60 mL/min (ref >60)
Glucose, Bld: 122 mg/dL — ABNORMAL HIGH (ref 70–99)
Potassium: 4.6 mmol/L (ref 3.5–5.1)
Sodium: 137 mmol/L (ref 135–145)
Total Bilirubin: 0.7 mg/dL (ref 0.3–1.2)
Total Protein: 7.3 g/dL (ref 6.5–8.1)

## 2022-06-30 LAB — PROTIME-INR
INR: 1 (ref 0.8–1.2)
Prothrombin Time: 13.6 seconds (ref 11.4–15.2)

## 2022-06-30 LAB — CBC WITH DIFFERENTIAL/PLATELET
Abs Immature Granulocytes: 0.01 10*3/uL (ref 0.00–0.07)
Basophils Absolute: 0 10*3/uL (ref 0.0–0.1)
Basophils Relative: 1 %
Eosinophils Absolute: 0.5 10*3/uL (ref 0.0–0.5)
Eosinophils Relative: 13 %
HCT: 39 % (ref 36.0–46.0)
Hemoglobin: 12.3 g/dL (ref 12.0–15.0)
Immature Granulocytes: 0 %
Lymphocytes Relative: 18 %
Lymphs Abs: 0.7 10*3/uL (ref 0.7–4.0)
MCH: 27.4 pg (ref 26.0–34.0)
MCHC: 31.5 g/dL (ref 30.0–36.0)
MCV: 86.9 fL (ref 80.0–100.0)
Monocytes Absolute: 0.3 10*3/uL (ref 0.1–1.0)
Monocytes Relative: 7 %
Neutro Abs: 2.4 10*3/uL (ref 1.7–7.7)
Neutrophils Relative %: 61 %
Platelets: 95 10*3/uL — ABNORMAL LOW (ref 150–400)
RBC: 4.49 MIL/uL (ref 3.87–5.11)
RDW: 13.4 % (ref 11.5–15.5)
WBC: 3.9 10*3/uL — ABNORMAL LOW (ref 4.0–10.5)
nRBC: 0 % (ref 0.0–0.2)

## 2022-06-30 LAB — HEMOGLOBIN A1C
Hgb A1c MFr Bld: 6.1 % — ABNORMAL HIGH (ref 4.8–5.6)
Mean Plasma Glucose: 128.37 mg/dL

## 2022-06-30 LAB — GLUCOSE, CAPILLARY: Glucose-Capillary: 117 mg/dL — ABNORMAL HIGH (ref 70–99)

## 2022-07-13 NOTE — Progress Notes (Addendum)
Anesthesia Chart Review   Case: [id number] Date/Time: 07/21/22 0815   Procedure: IR WITH ANESTHESIA MICROWAVE ABLATION   Anesthesia type: General   Pre-op diagnosis: LIVER LESION   [facility]   Surgeons: Simonne Come, MD       DISCUSSION:71 y.o. never smoker with h/o HTN, DM II (A1C 6.1), sarcoidosis, COPD, CAD, CHF, mild aortic stenosis, moderate mitral regurgitation, liver lesion scheduled for above procedure 07/21/2022 with Dr. Simonne Come.   Pt last seen by pulmonology 06/15/2022. Per OV note, "Pulmonary function is normal. Will monitor annually for markers of disease. She has no contraindications to advanced therapies if indicated from a liver perspective, nor does she require any further perioperative evaluation for surgeries.  Continue prn albuterol for now.  Switch to another anti-histamine like xyzal or claritin.  Will add astelin nasal spray with flonase for uncontrolled rhinitis"  Pt last seen by cardiology 07/16/2022. Pt stable at this visit with 1 year follow up recommended.   VS: There were no vitals taken for this visit.  PROVIDERS: Ralene Ok, MD is PCP   Cardiologist:  Jodelle Red, MD  LABS: Labs reviewed: Acceptable for surgery. (all labs ordered are listed, but only abnormal results are displayed)  Labs Reviewed - No data to display   IMAGES:   EKG:   CV: Cardiac Cath 09/23/2020   Prox Cx lesion is 20% stenosed.   Mild nonobstructive CAD with tortuous vessels and smooth 20% narrowing in the proximal circumflex far artery.  The LAD appears angiographically normal.  The right coronary artery is a dominant vessel with superior takeoff.   Mild LV to aorta gradient on pullback with a peak to peak gradient at approximately 16 mmHg.   RECOMMENDATION: Medical therapy.  Echo 09/23/2020 1. Left ventricular ejection fraction, by estimation, is 65 to 70%. The  left ventricle has normal function. The left ventricle has no regional  wall motion  abnormalities. Left ventricular diastolic parameters are  consistent with Grade II diastolic  dysfunction (pseudonormalization).   2. Right ventricular systolic function is normal. The right ventricular  size is normal.   3. Left atrial size was mildly dilated.   4. There is systolic anterior motion of the MV leaflet. . The mitral  valve is normal in structure. Moderate mitral valve regurgitation.   5. The aortic valve is normal in structure. Aortic valve regurgitation is  not visualized.   Myocardial Perfusion 02/23/2018 Nuclear stress EF: 63%. The study is normal. This is a low risk study. The left ventricular ejection fraction is normal (55-65%).   Normal stress nuclear study with no ischemia or infarction; EF 63 with normal wall motion.  Past Medical History:  Diagnosis Date   Anxiety    Arthritis    Asthma    Blood dyscrasia    Thrombocytopenia   CAD (coronary artery disease)    CHF (congestive heart failure) (HCC)    Complication of anesthesia    trouble waking up once with Nissen Fundoplication   COPD (chronic obstructive pulmonary disease) (HCC)    Depression    Diabetes mellitus without complication (HCC)    Dysrhythmia    Heart murmur    Hypertension    Hypothyroidism    Liver disease    NAFLD cirrhosis   Mild aortic stenosis    Pneumonia    Sarcoidosis    Sarcoidosis of lung (HCC)    Sarcoidosis of lymph nodes     Past Surgical History:  Procedure Laterality Date  ABDOMINAL HYSTERECTOMY     ABDOMINAL SURGERY     CHOLECYSTECTOMY     ESOPHAGOGASTRODUODENOSCOPY (EGD) WITH PROPOFOL N/A 04/17/2021   Procedure: ESOPHAGOGASTRODUODENOSCOPY (EGD) WITH PROPOFOL;  Surgeon: Jeani Hawking, MD;  [facility];  Service: Gastroenterology;  Laterality: N/A;   HERNIA REPAIR     LASIK  2001   LEFT HEART CATH AND CORONARY ANGIOGRAPHY N/A 09/23/2020   Procedure: LEFT HEART CATH AND CORONARY ANGIOGRAPHY;  Surgeon: Lennette Bihari, MD;  Location: [facility] INVASIVE [facility]  LAB;  Service: Cardiovascular;  Laterality: N/A;   nissenfundiplication     TOTAL KNEE ARTHROPLASTY Right 05/19/2021   Procedure: TOTAL KNEE ARTHROPLASTY;  Surgeon: Durene Romans, MD;  Location[facility];  Service: Orthopedics;  Laterality: Right;    MEDICATIONS: No current facility-administered medications for this encounter.    acetaminophen (TYLENOL) 325 MG tablet   acyclovir (ZOVIRAX) 400 MG tablet   albuterol (VENTOLIN HFA) 108 (90 Base) MCG/ACT inhaler   ALPRAZolam (XANAX) 0.25 MG tablet   amLODipine (NORVASC) 10 MG tablet   aspirin EC 81 MG tablet   Biotin 10 MG CAPS   Calcium Carb-Cholecalciferol (CALCIUM 600 + D PO)   carvedilol (COREG) 25 MG tablet   docusate sodium (COLACE) 100 MG capsule   fluticasone (FLONASE) 50 MCG/ACT nasal spray   furosemide (LASIX) 20 MG tablet   gabapentin (NEURONTIN) 300 MG capsule   hydrALAZINE (APRESOLINE) 25 MG tablet   irbesartan (AVAPRO) 150 MG tablet   levothyroxine (SYNTHROID, LEVOTHROID) 150 MCG tablet   metFORMIN (GLUCOPHAGE-XR) 500 MG 24 hr tablet   rosuvastatin (CRESTOR) 10 MG tablet   spironolactone (ALDACTONE) 25 MG tablet   Tetrahydrozoline-Zn Sulfate (ALLERGY RELIEF EYE DROPS OP)   tiZANidine (ZANAFLEX) 4 MG tablet   traMADol (ULTRAM) 50 MG tablet   zolpidem (AMBIEN) 10 MG tablet   azelastine (ASTELIN) 0.1 % nasal spray     Leslie Langille Ward, PA-C [facility] Pre-Surgical Testing [phone]

## 2022-07-16 ENCOUNTER — Ambulatory Visit (HOSPITAL_BASED_OUTPATIENT_CLINIC_OR_DEPARTMENT_OTHER): Payer: Medicare Other | Admitting: Cardiology

## 2022-07-16 ENCOUNTER — Encounter (HOSPITAL_BASED_OUTPATIENT_CLINIC_OR_DEPARTMENT_OTHER): Payer: Self-pay | Admitting: Cardiology

## 2022-07-16 VITALS — BP 110/68 | HR 64 | Ht 62.0 in | Wt 175.6 lb

## 2022-07-16 DIAGNOSIS — I340 Nonrheumatic mitral (valve) insufficiency: Secondary | ICD-10-CM

## 2022-07-16 DIAGNOSIS — I10 Essential (primary) hypertension: Secondary | ICD-10-CM | POA: Diagnosis not present

## 2022-07-16 DIAGNOSIS — K7469 Other cirrhosis of liver: Secondary | ICD-10-CM

## 2022-07-16 DIAGNOSIS — C220 Liver cell carcinoma: Secondary | ICD-10-CM | POA: Diagnosis not present

## 2022-07-16 DIAGNOSIS — I2510 Atherosclerotic heart disease of native coronary artery without angina pectoris: Secondary | ICD-10-CM

## 2022-07-16 NOTE — Patient Instructions (Signed)
Medication Instructions:  Your physician recommends that you continue on your current medications as directed. Please refer to the Current Medication list given to you today.  *If you need a refill on your cardiac medications before your next appointment, please call your pharmacy*   Follow-Up: At [facility], you and your health needs are our priority.  As part of our continuing mission to provide you with exceptional heart care, we have created designated Provider Care Teams.  These Care Teams include your primary Cardiologist (physician) and Advanced Practice Providers (APPs -  Physician Assistants and Nurse Practitioners) who all work together to provide you with the care you need, when you need it.   Your next appointment:   1 year(s)  Provider:   Bridgette Christopher, MD     

## 2022-07-16 NOTE — Progress Notes (Unsigned)
Cardiology Office Note:  .   Date:  07/16/2022  ID:  [name], DOB 01/05/1951, MRN [id number] PCP: Ralene Ok, MD  [facility] Providers Cardiologist:  Jodelle Red, MD {  History of Present Illness: Marland Kitchen   [name] is a 72 y.o. female  with a hx of HCC, end stage liver disease, type II diabetes, hypertension, sarcoidosis who is seen for follow up. She was initially seen 02/08/18 for chest pain.   Today: Having IR ablation on 07/21/22 of hepatocellular carcinoma. Undergoing transplant evaluation at [facility] for her liver.  Blood pressure has been well controlled. Has been well controlled to rarely low.   No significant chest pain. Rare esophageal pain with eating, brief, self resolves.  ROS: Denies chest pain, shortness of breath at rest or with normal exertion. No PND, orthopnea, LE edema or unexpected weight gain. No syncope or palpitations. ROS otherwise negative except as noted.   Studies Reviewed: Marland Kitchen    EKG:     not ordered today  Physical Exam:   VS:  BP 110/68   Pulse 64   Ht 5\' 2"  (1.575 m)   Wt 175 lb 9.6 oz (79.7 kg)   SpO2 98%   BMI 32.12 kg/m    Wt Readings from Last 3 Encounters:  07/16/22 175 lb 9.6 oz (79.7 kg)  06/30/22 178 lb (80.7 kg)  06/23/22 179 lb 9.6 oz (81.5 kg)    GEN: Well nourished, well developed in no acute distress HEENT: Normal, moist mucous membranes NECK: No JVD CARDIAC: regular rhythm, normal S1 and S2, no rubs or gallops. 2/6 systolic murmur. VASCULAR: Radial and DP pulses 2+ bilaterally. No carotid bruits RESPIRATORY:  Clear to auscultation without rales, wheezing or rhonchi  ABDOMEN: Soft, non-tender, non-distended MUSCULOSKELETAL:  Ambulates independently SKIN: Warm and dry, no edema NEUROLOGIC:  Alert and oriented x 3. No focal neuro deficits noted. PSYCHIATRIC:  Normal affect    ASSESSMENT AND PLAN: .    End stage liver disease Hepatocellular carcinoma -being evaluated for transplant at [facility] -pending  embolization of HCC  hypertension -home blood pressure monitoring reviewed , much improved with spironolactone -continue amlodipine 10 mg daily, carvedilol 25 mg BID, furosemide 20-40 mg based on the day, irbesartan 150 mg daily, spironolactone 25 mg daily -see prior notes; would not use hydralazine unless blood pressure severely elevated until we have a better understanding of her ANA. She has not required since our last visit -if her blood pressure trends down, would stop amlodipine first   Nonobstructive CAD by cath Type II diabetes -no angina -continue aspirin, rosuvastatin -on semaglutide (GLP1RA)   Mild aortic stenosis Moderate mitral regurgitation, systolic anterior motion of the mitral valve with asymmetric septal hypertrophy -murmur does not change with valsalva -heart rate is 70s-80s -given SAM/LVOT flow, increased carvedilol, now doing great without issues. Murmur quieter. -continue furosemide, irbesartan. Avoid dehydration/hyperdynamic LV given septal hypertrophy/SAM -no syncope  CV risk counseling and prevention -recommend heart healthy/Mediterranean diet, with whole grains, fruits, vegetable, fish, lean meats, nuts, and olive oil. Limit salt. -recommend moderate walking, 3-5 times/week for 30-50 minutes each session. Aim for at least 150 minutes.week. Goal should be pace of 3 miles/hours, or walking 1.5 miles in 30 minutes -recommend avoidance of tobacco products. Avoid excess alcohol.   Dispo: given that she will be followed closely at [facility] for the foreseeable future, will plan for 1 year follow up, but I am available as needed  Signed, Jodelle Red, MD   [name]  Cristal Deer, MD, PhD, [facility]  [facility]  [facility] [address] [place], [place] [address] [phone]

## 2022-07-20 ENCOUNTER — Other Ambulatory Visit: Payer: Self-pay | Admitting: Radiology

## 2022-07-20 DIAGNOSIS — C220 Liver cell carcinoma: Secondary | ICD-10-CM

## 2022-07-20 NOTE — H&P (Signed)
Referring Physician(s): Drazek,D  Supervising Physician: Simonne Come  Patient Status:  [facility] TBA  Chief Complaint: Hepatocellular carcinoma   Subjective: Patient known to IR service from random liver biopsy in 2006 and consultation with Dr. Grace Isaac on 06/03/22 to discuss treatment options for newly diagnosed/enlarging right liver lesion compatible with HCC.  She is a 72 year old female with past medical history of anxiety, arthritis, asthma, coronary artery disease, CHF, COPD, depression, diabetes, hypertension, hypothyroidism, mild aortic stenosis, sarcoidosis, and NASH cirrhosis. She was found to have a worrisome enhancing liver lesion on chest CT done  04/13/2022, performed for the evaluation of hemoptysis.  Subsequent abdominal MRI performed 04/22/2022 confirmed the presence of an approximately 3.1 x 3.1 cm enhancing lesion within segment 8 of the right lobe of the liver, characterized as LI-RADS 5, definitive HCC.The patient was subsequently  evaluated by Dr. Freida Busman of the hepatobiliary surgical service and deemed a nonoperative candidate given her medical comorbidities and degree of cirrhosis.The patient was then evaluated by Annamarie Major from the hepatobiliary transplant team and the patient was referred for consideration of percutaneous management.  Latest AFP on 04/30/2022 was 4.1.  Following discussions with Dr. Grace Isaac patient was deemed an appropriate candidate for transcatheter bland embolization followed by percutaneous microwave ablation of the right hepatocellular carcinoma via general anesthesia.  She presents today for the procedure.    Past Medical History:  Diagnosis Date   Anxiety    Arthritis    Asthma    Blood dyscrasia    Thrombocytopenia   CAD (coronary artery disease)    CHF (congestive heart failure) (HCC)    Complication of anesthesia    trouble waking up once with Nissen Fundoplication   COPD (chronic obstructive pulmonary disease) (HCC)    Depression    Diabetes  mellitus without complication (HCC)    Dysrhythmia    Heart murmur    Hypertension    Hypothyroidism    Liver disease    NAFLD cirrhosis   Mild aortic stenosis    Pneumonia    Sarcoidosis    Sarcoidosis of lung (HCC)    Sarcoidosis of lymph nodes    Past Surgical History:  Procedure Laterality Date   ABDOMINAL HYSTERECTOMY     ABDOMINAL SURGERY     CHOLECYSTECTOMY     ESOPHAGOGASTRODUODENOSCOPY (EGD) WITH PROPOFOL N/A 04/17/2021   Procedure: ESOPHAGOGASTRODUODENOSCOPY (EGD) WITH PROPOFOL;  Surgeon: Jeani Hawking, MD;  Location: [facility];  Service: Gastroenterology;  Laterality: N/A;   HERNIA REPAIR     LASIK  2001   LEFT HEART CATH AND CORONARY ANGIOGRAPHY N/A 09/23/2020   Procedure: LEFT HEART CATH AND CORONARY ANGIOGRAPHY;  Surgeon: Lennette Bihari, MD;  Location: [facility];  Service: Cardiovascular;  Laterality: N/A;   nissenfundiplication     TOTAL KNEE ARTHROPLASTY Right 05/19/2021   Procedure: TOTAL KNEE ARTHROPLASTY;  Surgeon: Durene Romans, MD;  Location: [facility];  Service: Orthopedics;  Laterality: Right;     Allergies: Sulfa antibiotics, Atorvastatin, Astelin [azelastine], and Amoxicillin  Medications: Prior to Admission medications   Medication Sig Start Date End Date Taking? Authorizing Provider  acetaminophen (TYLENOL) 325 MG tablet Take 650 mg by mouth every 6 (six) hours as needed for moderate pain.   Yes [provider]  acyclovir (ZOVIRAX) 400 MG tablet Take 400 mg by mouth every 8 (eight) hours as needed. Fever blisters 05/19/20  Yes [provider]  albuterol (VENTOLIN HFA) 108 (90 Base) MCG/ACT inhaler Inhale 2 puffs into the lungs  every 6 (six) hours as needed. 04/08/22  Yes Charlott Holler, MD  ALPRAZolam Prudy Feeler) 0.25 MG tablet Take 0.25 mg by mouth 3 (three) times daily as needed. 04/27/22  Yes [provider]  amLODipine (NORVASC) 10 MG tablet Take 1 tablet (10 mg total) by mouth daily. 11/04/21 10/30/22 Yes  Jodelle Red, MD  aspirin EC 81 MG tablet Take 81 mg by mouth daily. Swallow whole.   Yes [provider]  Biotin 10 MG CAPS Take 10 mg by mouth daily.   Yes [provider]  Calcium Carb-Cholecalciferol (CALCIUM 600 + D PO) Take 1 tablet by mouth daily.   Yes [provider]  carvedilol (COREG) 25 MG tablet TAKE 1 TABLET BY MOUTH TWICE DAILY WITH A MEAL 05/06/22  Yes Jodelle Red, MD  docusate sodium (COLACE) 100 MG capsule Take 1 capsule (100 mg total) by mouth 2 (two) times daily. 05/20/21  Yes Cassandria Anger, PA-C  fluticasone (FLONASE) 50 MCG/ACT nasal spray Place 1 spray into both nostrils daily. 06/15/22  Yes Charlott Holler, MD  furosemide (LASIX) 20 MG tablet Take 20-40 mg by mouth See admin instructions. Alternate taking 20 mg one day and 40 mg the next   Yes [provider]  gabapentin (NEURONTIN) 300 MG capsule Take 300 mg by mouth 2 (two) times daily. 09/02/20  Yes [provider]  irbesartan (AVAPRO) 150 MG tablet Take 1 tablet by mouth once daily 03/10/22  Yes Jodelle Red, MD  levothyroxine (SYNTHROID, LEVOTHROID) 150 MCG tablet Take 75-150 mcg by mouth See admin instructions. Pt alternates taking 150 mcg one day and 75 mcg the next with before breakfast   Yes [provider]  metFORMIN (GLUCOPHAGE-XR) 500 MG 24 hr tablet Take 2 tablets (1,000 mg total) by mouth 2 (two) times daily. Patient taking differently: Take 500 mg by mouth daily with breakfast. 09/25/20  Yes Marinda Elk, MD  rosuvastatin (CRESTOR) 10 MG tablet Take 10 mg by mouth daily.   Yes [provider]  spironolactone (ALDACTONE) 25 MG tablet Take 25 mg by mouth daily. 11/22/21  Yes [provider]  Tetrahydrozoline-Zn Sulfate (ALLERGY RELIEF EYE DROPS OP) Place 1 drop into both eyes daily as needed (allergies).   Yes [provider]  tiZANidine (ZANAFLEX) 4 MG tablet Take 4 mg by mouth every 8 (eight) hours  as needed for muscle spasms. 02/10/22  Yes [provider]  traMADol (ULTRAM) 50 MG tablet Take 50 mg by mouth daily as needed for moderate pain.   Yes [provider]  zolpidem (AMBIEN) 10 MG tablet Take 10 mg by mouth at bedtime.   Yes [provider]     Vital Signs:     Physical Exam  Imaging: No results found.  Labs:  CBC: Recent Labs    12/16/21 0957 04/13/22 0948 04/30/22 1231 06/30/22 0925  WBC 3.2* 2.9* 4.4 3.9*  HGB 11.5* 12.0 12.2 12.3  HCT 36.4 36.7 36.8 39.0  PLT 95* 78* 98* 95*    COAGS: Recent Labs    04/13/22 0948 06/30/22 0925  INR 1.1 1.0    BMP: Recent Labs    12/16/21 0957 04/13/22 0948 04/30/22 1231 06/30/22 0925  NA 140 138 140 137  K 3.8 3.5 4.4 4.6  CL 105 107 109 107  CO2 30 24 27 22   GLUCOSE 115* 110* 90 122*  BUN 13 12 12  26*  CALCIUM 10.6* 9.1 10.0 9.7  CREATININE 0.79 0.63 0.72 0.64  GFRNONAA >  60 >60 >60 >60    LIVER FUNCTION TESTS: Recent Labs    12/16/21 0957 04/13/22 0948 04/30/22 1231 06/30/22 0925  BILITOT 0.5 0.8 0.9 0.7  AST 35 54* 56* 55*  ALT 25 44 46* 69*  ALKPHOS 130* 105 109 123  PROT 8.1 7.7 7.6 7.3  ALBUMIN 3.9 3.8 3.8 3.7    Assessment and Plan: 72 year old female with past medical history of anxiety, arthritis, asthma, coronary artery disease, CHF, COPD, depression, diabetes, hypertension, hypothyroidism, mild aortic stenosis, sarcoidosis, and NASH cirrhosis. She was found to have a worrisome enhancing liver lesion on chest CT done  04/13/2022, performed for the evaluation of hemoptysis.  Subsequent abdominal MRI performed 04/22/2022 confirmed the presence of an approximately 3.1 x 3.1 cm enhancing lesion within segment 8 of the right lobe of the liver, characterized as LI-RADS 5, definitive HCC.The patient was subsequently  evaluated by Dr. Freida Busman of the hepatobiliary surgical service and deemed a nonoperative candidate given her medical comorbidities and degree of  cirrhosis.The patient was then evaluated by Annamarie Major from the hepatobiliary transplant team and the patient was referred for consideration of percutaneous management.  Latest AFP on 04/30/2022 was 4.1.  Following discussions with Dr. Grace Isaac patient was deemed an appropriate candidate for transcatheter bland embolization followed by percutaneous microwave ablation of the right hepatocellular carcinoma via general anesthesia.  She presents today for the procedure.  Details/risks of procedure, including but not limited to, internal bleeding, infection, injury to adjacent structures, anesthesia related complications discussed with patient with her understanding and consent.    Electronically Signed: D. Jeananne Rama, PA-C 07/20/2022, 1:35 PM   I spent a total of 30 minutes at the the patient's bedside AND on the patient's hospital floor or unit, greater than 50% of which was counseling/coordinating care for hepatic arteriogram with transcatheter bland embolization followed by percutaneous microwave ablation of right hepatocellular carcinoma

## 2022-07-21 ENCOUNTER — Ambulatory Visit (HOSPITAL_COMMUNITY)
Admission: RE | Admit: 2022-07-21 | Discharge: 2022-07-21 | Disposition: A | Discharge status: Home / Self Care | Payer: Medicare Other | Source: Ambulatory Visit | Attending: Interventional Radiology | Admitting: Interventional Radiology

## 2022-07-21 ENCOUNTER — Ambulatory Visit (HOSPITAL_BASED_OUTPATIENT_CLINIC_OR_DEPARTMENT_OTHER): Payer: Medicare Other | Admitting: Physician Assistant

## 2022-07-21 ENCOUNTER — Encounter (HOSPITAL_COMMUNITY): Payer: Self-pay | Admitting: Interventional Radiology

## 2022-07-21 ENCOUNTER — Encounter (HOSPITAL_COMMUNITY)
Admission: RE | Disposition: A | Discharge status: Home / Self Care | Payer: Self-pay | Source: Ambulatory Visit | Attending: Interventional Radiology

## 2022-07-21 ENCOUNTER — Inpatient Hospital Stay (HOSPITAL_COMMUNITY)
Admission: RE | Admit: 2022-07-21 | Discharge: 2022-07-24 | DRG: 405 | Disposition: A | Discharge status: Home / Self Care | Payer: Medicare Other | Source: Ambulatory Visit | Attending: Interventional Radiology | Admitting: Interventional Radiology

## 2022-07-21 ENCOUNTER — Other Ambulatory Visit: Payer: Self-pay

## 2022-07-21 ENCOUNTER — Ambulatory Visit (HOSPITAL_COMMUNITY): Payer: Medicare Other | Admitting: Physician Assistant

## 2022-07-21 DIAGNOSIS — I2510 Atherosclerotic heart disease of native coronary artery without angina pectoris: Secondary | ICD-10-CM

## 2022-07-21 DIAGNOSIS — K7460 Unspecified cirrhosis of liver: Secondary | ICD-10-CM | POA: Diagnosis present

## 2022-07-21 DIAGNOSIS — E039 Hypothyroidism, unspecified: Secondary | ICD-10-CM | POA: Diagnosis present

## 2022-07-21 DIAGNOSIS — D862 Sarcoidosis of lung with sarcoidosis of lymph nodes: Secondary | ICD-10-CM | POA: Diagnosis present

## 2022-07-21 DIAGNOSIS — M1990 Unspecified osteoarthritis, unspecified site: Secondary | ICD-10-CM | POA: Diagnosis present

## 2022-07-21 DIAGNOSIS — I110 Hypertensive heart disease with heart failure: Secondary | ICD-10-CM | POA: Diagnosis not present

## 2022-07-21 DIAGNOSIS — J310 Chronic rhinitis: Secondary | ICD-10-CM | POA: Diagnosis present

## 2022-07-21 DIAGNOSIS — Z882 Allergy status to sulfonamides status: Secondary | ICD-10-CM

## 2022-07-21 DIAGNOSIS — J4489 Other specified chronic obstructive pulmonary disease: Secondary | ICD-10-CM | POA: Diagnosis present

## 2022-07-21 DIAGNOSIS — K7581 Nonalcoholic steatohepatitis (NASH): Secondary | ICD-10-CM | POA: Diagnosis present

## 2022-07-21 DIAGNOSIS — E119 Type 2 diabetes mellitus without complications: Secondary | ICD-10-CM | POA: Diagnosis present

## 2022-07-21 DIAGNOSIS — R55 Syncope and collapse: Secondary | ICD-10-CM | POA: Diagnosis not present

## 2022-07-21 DIAGNOSIS — K769 Liver disease, unspecified: Secondary | ICD-10-CM

## 2022-07-21 DIAGNOSIS — K5900 Constipation, unspecified: Secondary | ICD-10-CM | POA: Diagnosis not present

## 2022-07-21 DIAGNOSIS — Z8701 Personal history of pneumonia (recurrent): Secondary | ICD-10-CM

## 2022-07-21 DIAGNOSIS — F419 Anxiety disorder, unspecified: Secondary | ICD-10-CM | POA: Diagnosis present

## 2022-07-21 DIAGNOSIS — R7303 Prediabetes: Secondary | ICD-10-CM

## 2022-07-21 DIAGNOSIS — I509 Heart failure, unspecified: Secondary | ICD-10-CM | POA: Diagnosis present

## 2022-07-21 DIAGNOSIS — C220 Liver cell carcinoma: Secondary | ICD-10-CM

## 2022-07-21 DIAGNOSIS — Z880 Allergy status to penicillin: Secondary | ICD-10-CM

## 2022-07-21 DIAGNOSIS — Z7982 Long term (current) use of aspirin: Secondary | ICD-10-CM

## 2022-07-21 DIAGNOSIS — K683 Retroperitoneal hematoma: Secondary | ICD-10-CM | POA: Diagnosis not present

## 2022-07-21 DIAGNOSIS — Z01818 Encounter for other preprocedural examination: Secondary | ICD-10-CM

## 2022-07-21 DIAGNOSIS — Z7984 Long term (current) use of oral hypoglycemic drugs: Secondary | ICD-10-CM

## 2022-07-21 DIAGNOSIS — Z79899 Other long term (current) drug therapy: Secondary | ICD-10-CM

## 2022-07-21 DIAGNOSIS — Z888 Allergy status to other drugs, medicaments and biological substances status: Secondary | ICD-10-CM

## 2022-07-21 DIAGNOSIS — Z79890 Hormone replacement therapy (postmenopausal): Secondary | ICD-10-CM

## 2022-07-21 DIAGNOSIS — Z90710 Acquired absence of both cervix and uterus: Secondary | ICD-10-CM

## 2022-07-21 DIAGNOSIS — F32A Depression, unspecified: Secondary | ICD-10-CM | POA: Diagnosis present

## 2022-07-21 DIAGNOSIS — Z9049 Acquired absence of other specified parts of digestive tract: Secondary | ICD-10-CM

## 2022-07-21 DIAGNOSIS — Z96651 Presence of right artificial knee joint: Secondary | ICD-10-CM | POA: Diagnosis present

## 2022-07-21 DIAGNOSIS — M25511 Pain in right shoulder: Secondary | ICD-10-CM | POA: Diagnosis not present

## 2022-07-21 HISTORY — DX: Malignant (primary) neoplasm, unspecified: C80.1

## 2022-07-21 HISTORY — PX: IR US GUIDE VASC ACCESS RIGHT: IMG2390

## 2022-07-21 HISTORY — PX: IR ANGIOGRAM VISCERAL SELECTIVE: IMG657

## 2022-07-21 HISTORY — PX: IR EMBO TUMOR ORGAN ISCHEMIA INFARCT INC GUIDE ROADMAPPING: IMG5449

## 2022-07-21 HISTORY — PX: IR GUIDED LIVER TUMOR ABLATION RFA PERC: IMG933

## 2022-07-21 HISTORY — PX: RADIOLOGY WITH ANESTHESIA: SHX6223

## 2022-07-21 HISTORY — PX: IR ANGIOGRAM SELECTIVE EACH ADDITIONAL VESSEL: IMG667

## 2022-07-21 HISTORY — PX: IR 3D INDEPENDENT WKST: IMG2385

## 2022-07-21 LAB — COMPREHENSIVE METABOLIC PANEL
ALT: 53 U/L — ABNORMAL HIGH (ref 0–44)
AST: 49 U/L — ABNORMAL HIGH (ref 15–41)
Albumin: 3.6 g/dL (ref 3.5–5.0)
Alkaline Phosphatase: 113 U/L (ref 38–126)
Anion gap: 8 (ref 5–15)
BUN: 32 mg/dL — ABNORMAL HIGH (ref 8–23)
CO2: 20 mmol/L — ABNORMAL LOW (ref 22–32)
Calcium: 9.6 mg/dL (ref 8.9–10.3)
Chloride: 107 mmol/L (ref 98–111)
Creatinine, Ser: 0.68 mg/dL (ref 0.44–1.00)
GFR, Estimated: >60 mL/min (ref >60)
Glucose, Bld: 133 mg/dL — ABNORMAL HIGH (ref 70–99)
Potassium: 4.3 mmol/L (ref 3.5–5.1)
Sodium: 135 mmol/L (ref 135–145)
Total Bilirubin: 0.5 mg/dL (ref 0.3–1.2)
Total Protein: 7.5 g/dL (ref 6.5–8.1)

## 2022-07-21 LAB — CBC WITH DIFFERENTIAL/PLATELET
Abs Immature Granulocytes: 0.02 10*3/uL (ref 0.00–0.07)
Basophils Absolute: 0.1 10*3/uL (ref 0.0–0.1)
Basophils Relative: 1 %
Eosinophils Absolute: 0.4 10*3/uL (ref 0.0–0.5)
Eosinophils Relative: 10 %
HCT: 39.2 % (ref 36.0–46.0)
Hemoglobin: 12.4 g/dL (ref 12.0–15.0)
Immature Granulocytes: 1 %
Lymphocytes Relative: 22 %
Lymphs Abs: 0.9 10*3/uL (ref 0.7–4.0)
MCH: 27.4 pg (ref 26.0–34.0)
MCHC: 31.6 g/dL (ref 30.0–36.0)
MCV: 86.5 fL (ref 80.0–100.0)
Monocytes Absolute: 0.4 10*3/uL (ref 0.1–1.0)
Monocytes Relative: 9 %
Neutro Abs: 2.3 10*3/uL (ref 1.7–7.7)
Neutrophils Relative %: 57 %
Platelets: 94 10*3/uL — ABNORMAL LOW (ref 150–400)
RBC: 4.53 MIL/uL (ref 3.87–5.11)
RDW: 13.8 % (ref 11.5–15.5)
WBC: 4 10*3/uL (ref 4.0–10.5)
nRBC: 0 % (ref 0.0–0.2)

## 2022-07-21 LAB — CBC
HCT: 31.7 % — ABNORMAL LOW (ref 36.0–46.0)
Hemoglobin: 9.7 g/dL — ABNORMAL LOW (ref 12.0–15.0)
MCH: 27.5 pg (ref 26.0–34.0)
MCHC: 30.6 g/dL (ref 30.0–36.0)
MCV: 89.8 fL (ref 80.0–100.0)
Platelets: 174 10*3/uL (ref 150–400)
RBC: 3.53 MIL/uL — ABNORMAL LOW (ref 3.87–5.11)
RDW: 14.2 % (ref 11.5–15.5)
WBC: 13.7 10*3/uL — ABNORMAL HIGH (ref 4.0–10.5)
nRBC: 0 % (ref 0.0–0.2)

## 2022-07-21 LAB — GLUCOSE, CAPILLARY
Glucose-Capillary: 114 mg/dL — ABNORMAL HIGH (ref 70–99)
Glucose-Capillary: 107 mg/dL — ABNORMAL HIGH (ref 70–99)
Glucose-Capillary: 108 mg/dL — ABNORMAL HIGH (ref 70–99)
Glucose-Capillary: 234 mg/dL — ABNORMAL HIGH (ref 70–99)

## 2022-07-21 SURGERY — IR WITH ANESTHESIA
Anesthesia: General

## 2022-07-21 MED ORDER — LIDOCAINE 2% (20 MG/ML) 5 ML SYRINGE
INTRAMUSCULAR | Status: DC | PRN
  Administered 2022-07-21: 60 mg via INTRAVENOUS

## 2022-07-21 MED ORDER — LEVOTHYROXINE SODIUM 75 MCG PO TABS: 75.0000 ug | ORAL_TABLET | ORAL | Status: DC

## 2022-07-21 MED ORDER — ROCURONIUM BROMIDE 10 MG/ML (PF) SYRINGE
PREFILLED_SYRINGE | INTRAVENOUS | Status: DC | PRN
  Administered 2022-07-21: 20 mg via INTRAVENOUS
  Administered 2022-07-21: 50 mg via INTRAVENOUS

## 2022-07-21 MED ORDER — ONDANSETRON HCL 4 MG/2ML IJ SOLN
INTRAMUSCULAR | Status: DC | PRN
  Administered 2022-07-21: 4 mg via INTRAVENOUS

## 2022-07-21 MED ORDER — LACTATED RINGERS IV SOLN: INTRAVENOUS | Status: DC

## 2022-07-21 MED ORDER — KETOROLAC TROMETHAMINE 30 MG/ML IJ SOLN
INTRAMUSCULAR | Status: AC
  Filled 2022-07-21: qty 1

## 2022-07-21 MED ORDER — PHENYLEPHRINE HCL-NACL 20-0.9 MG/250ML-% IV SOLN
INTRAVENOUS | Status: DC | PRN
  Administered 2022-07-21: 25 ug/min via INTRAVENOUS

## 2022-07-21 MED ORDER — SPIRONOLACTONE 25 MG PO TABS
25.0000 mg | ORAL_TABLET | Freq: Every day | ORAL | Status: DC
  Administered 2022-07-21 – 2022-07-24 (×2): 25 mg via ORAL
  Filled 2022-07-21 (×4): qty 1

## 2022-07-21 MED ORDER — TIZANIDINE HCL 4 MG PO TABS: 4.0000 mg | ORAL_TABLET | Freq: Three times a day (TID) | ORAL | Status: DC | PRN

## 2022-07-21 MED ORDER — LIDOCAINE-EPINEPHRINE 1 %-1:100000 IJ SOLN
20.0000 mL | Freq: Once | INTRAMUSCULAR | Status: AC
  Administered 2022-07-21: 7 mL via INTRADERMAL

## 2022-07-21 MED ORDER — LIDOCAINE HCL 1 % IJ SOLN
INTRAMUSCULAR | Status: AC
  Filled 2022-07-21: qty 20

## 2022-07-21 MED ORDER — ZOLPIDEM TARTRATE 5 MG PO TABS
10.0000 mg | ORAL_TABLET | Freq: Every day | ORAL | Status: DC
  Administered 2022-07-22 – 2022-07-23 (×2): 10 mg via ORAL
  Filled 2022-07-21 (×2): qty 2

## 2022-07-21 MED ORDER — LIDOCAINE HCL (PF) 1 % IJ SOLN
INTRAMUSCULAR | Status: AC
  Filled 2022-07-21: qty 30

## 2022-07-21 MED ORDER — OXYCODONE HCL 5 MG PO TABS
5.0000 mg | ORAL_TABLET | Freq: Once | ORAL | Status: AC | PRN
  Administered 2022-07-21: 5 mg via ORAL

## 2022-07-21 MED ORDER — ONDANSETRON HCL 4 MG/2ML IJ SOLN
4.0000 mg | Freq: Four times a day (QID) | INTRAMUSCULAR | Status: DC | PRN
  Administered 2022-07-21: 4 mg via INTRAVENOUS
  Filled 2022-07-21: qty 2

## 2022-07-21 MED ORDER — IRBESARTAN 150 MG PO TABS
150.0000 mg | ORAL_TABLET | Freq: Every day | ORAL | Status: DC
  Administered 2022-07-21: 150 mg via ORAL
  Filled 2022-07-21 (×3): qty 1

## 2022-07-21 MED ORDER — ALPRAZOLAM 0.25 MG PO TABS: 0.2500 mg | ORAL_TABLET | Freq: Three times a day (TID) | ORAL | Status: DC | PRN

## 2022-07-21 MED ORDER — SUGAMMADEX SODIUM 200 MG/2ML IV SOLN
INTRAVENOUS | Status: DC | PRN
  Administered 2022-07-21: 200 mg via INTRAVENOUS

## 2022-07-21 MED ORDER — IOHEXOL 300 MG/ML  SOLN
100.0000 mL | Freq: Once | INTRAMUSCULAR | Status: AC | PRN
  Administered 2022-07-21: 20 mL via INTRA_ARTERIAL

## 2022-07-21 MED ORDER — ALBUTEROL SULFATE (2.5 MG/3ML) 0.083% IN NEBU: 3.0000 mL | INHALATION_SOLUTION | Freq: Four times a day (QID) | RESPIRATORY_TRACT | Status: DC | PRN

## 2022-07-21 MED ORDER — HYDROMORPHONE HCL 1 MG/ML IJ SOLN
INTRAMUSCULAR | Status: AC
  Filled 2022-07-21: qty 1

## 2022-07-21 MED ORDER — DEXAMETHASONE SODIUM PHOSPHATE 10 MG/ML IJ SOLN
INTRAMUSCULAR | Status: DC | PRN
  Administered 2022-07-21: 6 mg via INTRAVENOUS

## 2022-07-21 MED ORDER — MIDAZOLAM HCL 2 MG/2ML IJ SOLN
INTRAMUSCULAR | Status: AC
  Filled 2022-07-21: qty 2

## 2022-07-21 MED ORDER — HYDROMORPHONE HCL 1 MG/ML IJ SOLN
0.2500 mg | INTRAMUSCULAR | Status: DC | PRN
  Administered 2022-07-21: 0.5 mg via INTRAVENOUS

## 2022-07-21 MED ORDER — AMLODIPINE BESYLATE 10 MG PO TABS
10.0000 mg | ORAL_TABLET | Freq: Every day | ORAL | Status: DC
  Administered 2022-07-21: 10 mg via ORAL
  Filled 2022-07-21 (×3): qty 1

## 2022-07-21 MED ORDER — FENTANYL CITRATE (PF) 100 MCG/2ML IJ SOLN
INTRAMUSCULAR | Status: AC
  Filled 2022-07-21: qty 2

## 2022-07-21 MED ORDER — SODIUM CHLORIDE 0.9% FLUSH: 9.0000 mL | INTRAVENOUS | Status: DC | PRN

## 2022-07-21 MED ORDER — FUROSEMIDE 20 MG PO TABS
20.0000 mg | ORAL_TABLET | ORAL | Status: DC
  Administered 2022-07-21 – 2022-07-23 (×2): 20 mg via ORAL
  Filled 2022-07-21 (×2): qty 1

## 2022-07-21 MED ORDER — AMISULPRIDE (ANTIEMETIC) 5 MG/2ML IV SOLN
10.0000 mg | Freq: Once | INTRAVENOUS | Status: AC | PRN
  Administered 2022-07-21: 10 mg via INTRAVENOUS

## 2022-07-21 MED ORDER — MIDAZOLAM HCL 5 MG/5ML IJ SOLN
INTRAMUSCULAR | Status: DC | PRN
  Administered 2022-07-21: 2 mg via INTRAVENOUS

## 2022-07-21 MED ORDER — FLUTICASONE PROPIONATE 50 MCG/ACT NA SUSP
1.0000 | Freq: Every day | NASAL | Status: DC
  Administered 2022-07-21 – 2022-07-24 (×4): 1 via NASAL
  Filled 2022-07-21: qty 16

## 2022-07-21 MED ORDER — INSULIN ASPART 100 UNIT/ML IJ SOLN: 0.0000 [IU] | INTRAMUSCULAR | Status: DC | PRN

## 2022-07-21 MED ORDER — LEVOTHYROXINE SODIUM 75 MCG PO TABS
75.0000 ug | ORAL_TABLET | ORAL | Status: DC
  Administered 2022-07-22 – 2022-07-24 (×2): 75 ug via ORAL
  Filled 2022-07-21 (×2): qty 1

## 2022-07-21 MED ORDER — ROSUVASTATIN CALCIUM 10 MG PO TABS
10.0000 mg | ORAL_TABLET | Freq: Every day | ORAL | Status: DC
  Administered 2022-07-21 – 2022-07-24 (×4): 10 mg via ORAL
  Filled 2022-07-21 (×4): qty 1

## 2022-07-21 MED ORDER — LEVOTHYROXINE SODIUM 75 MCG PO TABS
150.0000 ug | ORAL_TABLET | ORAL | Status: DC
  Administered 2022-07-23: 150 ug via ORAL
  Filled 2022-07-21: qty 2

## 2022-07-21 MED ORDER — DIPHENHYDRAMINE HCL 12.5 MG/5ML PO ELIX: 12.5000 mg | ORAL_SOLUTION | Freq: Four times a day (QID) | ORAL | Status: DC | PRN

## 2022-07-21 MED ORDER — ORAL CARE MOUTH RINSE: 15.0000 mL | Freq: Once | OROMUCOSAL | Status: AC

## 2022-07-21 MED ORDER — GABAPENTIN 100 MG PO CAPS
300.0000 mg | ORAL_CAPSULE | Freq: Two times a day (BID) | ORAL | Status: DC
  Administered 2022-07-22 – 2022-07-24 (×5): 300 mg via ORAL
  Filled 2022-07-21 (×5): qty 3

## 2022-07-21 MED ORDER — AMISULPRIDE (ANTIEMETIC) 5 MG/2ML IV SOLN
INTRAVENOUS | Status: AC
  Filled 2022-07-21: qty 4

## 2022-07-21 MED ORDER — METRONIDAZOLE 500 MG/100ML IV SOLN
500.0000 mg | Freq: Once | INTRAVENOUS | Status: AC
  Administered 2022-07-21: 500 mg via INTRAVENOUS
  Filled 2022-07-21: qty 100

## 2022-07-21 MED ORDER — NALOXONE HCL 0.4 MG/ML IJ SOLN: 0.4000 mg | INTRAMUSCULAR | Status: DC | PRN

## 2022-07-21 MED ORDER — FENTANYL CITRATE (PF) 100 MCG/2ML IJ SOLN
INTRAMUSCULAR | Status: DC | PRN
  Administered 2022-07-21: 50 ug via INTRAVENOUS
  Administered 2022-07-21 (×2): 25 ug via INTRAVENOUS

## 2022-07-21 MED ORDER — DIPHENHYDRAMINE HCL 50 MG/ML IJ SOLN: 12.5000 mg | Freq: Four times a day (QID) | INTRAMUSCULAR | Status: DC | PRN

## 2022-07-21 MED ORDER — OXYCODONE HCL 5 MG/5ML PO SOLN: 5.0000 mg | Freq: Once | ORAL | Status: AC | PRN

## 2022-07-21 MED ORDER — LIDOCAINE-EPINEPHRINE 1 %-1:100000 IJ SOLN
INTRAMUSCULAR | Status: AC
  Filled 2022-07-21: qty 1

## 2022-07-21 MED ORDER — OXYCODONE HCL 5 MG PO TABS
ORAL_TABLET | ORAL | Status: AC
  Filled 2022-07-21: qty 1

## 2022-07-21 MED ORDER — METFORMIN HCL ER 500 MG PO TB24
500.0000 mg | ORAL_TABLET | Freq: Every day | ORAL | Status: DC
  Administered 2022-07-22 – 2022-07-24 (×3): 500 mg via ORAL
  Filled 2022-07-21 (×3): qty 1

## 2022-07-21 MED ORDER — FENTANYL CITRATE PF 50 MCG/ML IJ SOSY
PREFILLED_SYRINGE | INTRAMUSCULAR | Status: AC
  Filled 2022-07-21: qty 1

## 2022-07-21 MED ORDER — LEVOFLOXACIN IN D5W 500 MG/100ML IV SOLN
500.0000 mg | Freq: Once | INTRAVENOUS | Status: AC
  Administered 2022-07-21: 500 mg via INTRAVENOUS
  Filled 2022-07-21: qty 100

## 2022-07-21 MED ORDER — IOHEXOL 300 MG/ML  SOLN
100.0000 mL | Freq: Once | INTRAMUSCULAR | Status: AC | PRN
  Administered 2022-07-21: 15 mL via INTRA_ARTERIAL

## 2022-07-21 MED ORDER — PHENYLEPHRINE 80 MCG/ML (10ML) SYRINGE FOR IV PUSH (FOR BLOOD PRESSURE SUPPORT)
PREFILLED_SYRINGE | INTRAVENOUS | Status: DC | PRN
  Administered 2022-07-21: 160 ug via INTRAVENOUS

## 2022-07-21 MED ORDER — CHLORHEXIDINE GLUCONATE 0.12 % MT SOLN
15.0000 mL | Freq: Once | OROMUCOSAL | Status: AC
  Administered 2022-07-21: 15 mL via OROMUCOSAL

## 2022-07-21 MED ORDER — KETOROLAC TROMETHAMINE 30 MG/ML IJ SOLN
INTRAMUSCULAR | Status: DC | PRN
  Administered 2022-07-21: 30 mg via INTRAMUSCULAR

## 2022-07-21 MED ORDER — FUROSEMIDE 20 MG PO TABS: 20.0000 mg | ORAL_TABLET | ORAL | Status: DC

## 2022-07-21 MED ORDER — PROPOFOL 10 MG/ML IV BOLUS
INTRAVENOUS | Status: DC | PRN
  Administered 2022-07-21: 120 mg via INTRAVENOUS

## 2022-07-21 MED ORDER — CARVEDILOL 25 MG PO TABS
25.0000 mg | ORAL_TABLET | Freq: Two times a day (BID) | ORAL | Status: DC
  Administered 2022-07-21 – 2022-07-24 (×5): 25 mg via ORAL
  Filled 2022-07-21 (×6): qty 1

## 2022-07-21 MED ORDER — FUROSEMIDE 40 MG PO TABS
40.0000 mg | ORAL_TABLET | ORAL | Status: DC
  Administered 2022-07-24: 40 mg via ORAL
  Filled 2022-07-21: qty 1

## 2022-07-21 MED ORDER — FENTANYL 50 MCG/ML IV PCA SOLN
INTRAVENOUS | Status: DC
  Administered 2022-07-21: 30 ug via INTRAVENOUS
  Filled 2022-07-21: qty 25

## 2022-07-21 MED ORDER — FENTANYL CITRATE PF 50 MCG/ML IJ SOSY
25.0000 ug | PREFILLED_SYRINGE | INTRAMUSCULAR | Status: DC | PRN
  Administered 2022-07-21 (×2): 25 ug via INTRAVENOUS
  Administered 2022-07-21: 50 ug via INTRAVENOUS

## 2022-07-21 MED ORDER — FENTANYL CITRATE PF 50 MCG/ML IJ SOSY
PREFILLED_SYRINGE | INTRAMUSCULAR | Status: AC
  Administered 2022-07-21: 50 ug via INTRAVENOUS
  Filled 2022-07-21: qty 2

## 2022-07-21 NOTE — Anesthesia Procedure Notes (Signed)
Procedure Name: Intubation Date/Time: 07/21/2022 8:41 AM  Performed by: Elisabeth Cara, CRNAPre-anesthesia Checklist: Patient identified, Emergency Drugs available, Suction available, Patient being monitored and Timeout performed Patient Re-evaluated:Patient Re-evaluated prior to induction Oxygen Delivery Method: Circle system utilized Preoxygenation: Pre-oxygenation with 100% oxygen Induction Type: IV induction Ventilation: Mask ventilation without difficulty Laryngoscope Size: Mac and 4 Grade View: Grade I Tube type: Oral Tube size: 7.5 mm Number of attempts: 1 Airway Equipment and Method: Stylet Placement Confirmation: ETT inserted through vocal cords under direct vision, positive ETCO2 and breath sounds checked- equal and bilateral Secured at: 21 cm Tube secured with: Tape Dental Injury: Teeth and Oropharynx as per pre-operative assessment

## 2022-07-21 NOTE — Procedures (Signed)
Pre-procedure Diagnosis: HCC Post-procedure Diagnosis: Same  Post mesenteric arteriogram and bland embolization of HCC. Post percutaneous [place] and fluoro guided ablation of HCC    Complications: None Immediate EBL: None  Keep right leg straight for 4 hrs (until 1500)  Signed: Simonne Come Pager: [phone] 07/21/2022, 11:09 AM

## 2022-07-21 NOTE — Transfer of Care (Signed)
Immediate Anesthesia Transfer of Care Note  Patient: [name]  Procedure(s) Performed: IR WITH ANESTHESIA MICROWAVE ABLATION  Patient Location: PACU  Anesthesia Type:General  Level of Consciousness: awake, alert , oriented, and patient cooperative  Airway & Oxygen Therapy: Patient Spontanous Breathing and Patient connected to face mask oxygen  Post-op Assessment: Report given to RN, Post -op Vital signs reviewed and stable, and Patient moving all extremities  Post vital signs: Reviewed and stable  Last Vitals:  Vitals Value Taken Time  BP 158/77 07/21/22 1109  Temp    Pulse 70 07/21/22 1114  Resp 15 07/21/22 1114  SpO2 100 % 07/21/22 1114  Vitals shown include unvalidated device data.  Last Pain:  Vitals:   07/21/22 0740  TempSrc:   PainSc: 0-No pain         Complications: No notable events documented.

## 2022-07-21 NOTE — Progress Notes (Signed)
Patient ID: [name], female   DOB: 27-Aug-1950, 72 y.o.   MRN: [id number] Patient status post image guided bland embolization and microwave ablation of right hepatocellular carcinoma earlier today; slightly drowsy but arousable, complaining of some intermittent nausea, mild right upper abdominal discomfort with some referred right shoulder pain; afebrile, BP 142/51, heart rate 64, O2 sats 99%; puncture sites right upper quadrant abdomen and right CFA clean and dry, soft, no distinct hematomas.  Yellow urine in Foley.  Some mild right upper quadrant tenderness to palpation.  Intact distal pulses.  Plan for overnight observation, antiemetics as needed, patient on fentanyl PCA.  DC Foley after 6-hour bedrest period complete.  Follow-up with Dr. Grace Isaac in 3-4 weeks.

## 2022-07-21 NOTE — Anesthesia Preprocedure Evaluation (Signed)
Anesthesia Evaluation  Patient identified by MRN, date of birth, ID band Patient awake    Reviewed: Allergy & Precautions, NPO status , Patient's Chart, lab work & pertinent test results  Airway Mallampati: II  TM Distance: >3 FB Neck ROM: Full    Dental  (+) Dental Advisory Given   Pulmonary asthma , COPD   breath sounds clear to auscultation       Cardiovascular hypertension, Pt. on medications + CAD and +CHF  + Valvular Problems/Murmurs  Rhythm:Regular Rate:Normal     Neuro/Psych    GI/Hepatic negative GI ROS,,,Liver lesion   Endo/Other  diabetesHypothyroidism    Renal/GU Renal disease     Musculoskeletal  (+) Arthritis ,    Abdominal   Peds  Hematology  (+) Blood dyscrasia   Anesthesia Other Findings   Reproductive/Obstetrics                            Cardiac Cath 09/23/2020 .  Prox Cx lesion is 20% stenosed.   Mild nonobstructive CAD with tortuous vessels and smooth 20% narrowing in the proximal circumflex far artery.  The LAD appears angiographically normal.  The right coronary artery is a dominant vessel with superior takeoff.   Mild LV to aorta gradient on pullback with a peak to peak gradient at approximately 16 mmHg.   RECOMMENDATION: Medical therapy.   Echo 09/23/2020 1. Left ventricular ejection fraction, by estimation, is 65 to 70%. The  left ventricle has normal function. The left ventricle has no regional  wall motion abnormalities. Left ventricular diastolic parameters are  consistent with Grade II diastolic  dysfunction (pseudonormalization).   2. Right ventricular systolic function is normal. The right ventricular  size is normal.   3. Left atrial size was mildly dilated.   4. There is systolic anterior motion of the MV leaflet. . The mitral  valve is normal in structure. Moderate mitral valve regurgitation.   5. The aortic valve is normal in structure. Aortic  valve regurgitation is  not visualized.    Myocardial Perfusion 02/23/2018 Nuclear stress EF: 63%. The study is normal. This is a low risk study. The left ventricular ejection fraction is normal (55-65%). Anesthesia Physical Anesthesia Plan  ASA: 3  Anesthesia Plan: General   Post-op Pain Management: Minimal or no pain anticipated   Induction: Intravenous  PONV Risk Score and Plan: 3 and Dexamethasone, Ondansetron and Treatment may vary due to age or medical condition  Airway Management Planned: Oral ETT  Additional Equipment: None  Intra-op Plan:   Post-operative Plan: Extubation in OR  Informed Consent: I have reviewed the patients History and Physical, chart, labs and discussed the procedure including the risks, benefits and alternatives for the proposed anesthesia with the patient or authorized representative who has indicated his/her understanding and acceptance.     Dental advisory given  Plan Discussed with: CRNA  Anesthesia Plan Comments:         Anesthesia Quick Evaluation

## 2022-07-21 NOTE — Anesthesia Postprocedure Evaluation (Signed)
Anesthesia Post Note  Patient: [name]  Procedure(s) Performed: IR WITH ANESTHESIA MICROWAVE ABLATION     Patient location during evaluation: PACU Anesthesia Type: General Level of consciousness: awake and alert Pain management: pain level controlled Vital Signs Assessment: post-procedure vital signs reviewed and stable Respiratory status: spontaneous breathing, nonlabored ventilation, respiratory function stable and patient connected to nasal cannula oxygen Cardiovascular status: blood pressure returned to baseline and stable Postop Assessment: no apparent nausea or vomiting Anesthetic complications: no  No notable events documented.  Last Vitals:  Vitals:   07/21/22 1456 07/21/22 1600  BP: (!) 142/51 (!) 121/48  Pulse: 64 61  Resp: 16 16  Temp:  36.6 C  SpO2: 99% 98%    Last Pain:  Vitals:   07/21/22 1600  TempSrc: Oral  PainSc:                  Kennieth Rad

## 2022-07-21 NOTE — Significant Event (Signed)
Rapid Response Event Note   Reason for Call :  Near syncopal episode.  Initial Focused Assessment:  Patient was ambulating to the bathroom with bedside nurse when she reported feeling lightheaded, weak, dizzy, nausea, short of breath, and that she might pass out. When I arrived, patient was alert, diaphoretic, pale, sitting up in wheelchair at bedside with BP of 69/45. She was responsive, but slow to follow commands or answer questions. Confirmed with bedside staff and spouse that patient did not lose consciousness. CBG checked and was 234. Four minutes later, her BP was 93/34. With myself, bedside RN and NT, transferred patient back to bed with stand/pivot maneuver. After ten minutes, BP rechecked and was 114/40. Lung sounds are clear bilaterally; Heart sounds with murmur consistent with baseline medical history. No peripheral edema observed. Patient is alert and oriented x 4 twenty minutes after returning to bed and skin color back to baseline consistent with ethnicity; able to follow commands as well. Patient additionally reports resolution of dizziness and other previously reported symptoms. No bleeding seen while in bathroom and dressing from ablation done earlier today is clean, dry, and intact. ECG is unremarkable. Patient does state that she hasn't eaten much all day due to nausea. Patient has sipped water and eaten ice chips that raise concerns for anemia as well.  Interventions:  Assessment as documented above. VS, CBG, and ECG performed and documented. Paged on call provider for attending and updated on situation when he called. Implemented verbal orders:  D/C fentanyl PCA as patient is opioid naive and utilize prn meds already prescribed. Additionally, orders given for IV fluids: LR at 124ml/hr for 6 hours and STAT CBC to check for any potential drop since procedure earlier today. Encouraged oral intake as tolerated and frequent incentive spirometry use to avoid pneumonia due to increased time  in the bed.  Plan of Care:  Vital signs to be monitored every four hours and orthostatic vitals at 0600. Advised to utilize bedpan and progress to [facility] at 0600. Patient to remain in current level of care at this time. Patient/spouse will inform bedside RN if needs arise. Rapid RN also available if any worsening condition.   Event Summary:   MD Notified: paged at 2045 IR physician on call Dr. Milford Cage (returned page at [phone]) Call Time: 2034 Arrival Time: 2035 End Time: 2122  Lamona Curl, RN

## 2022-07-22 ENCOUNTER — Other Ambulatory Visit: Payer: Self-pay

## 2022-07-22 ENCOUNTER — Encounter (HOSPITAL_COMMUNITY): Payer: Self-pay | Admitting: Interventional Radiology

## 2022-07-22 ENCOUNTER — Observation Stay (HOSPITAL_COMMUNITY): Payer: Medicare Other

## 2022-07-22 DIAGNOSIS — I110 Hypertensive heart disease with heart failure: Secondary | ICD-10-CM | POA: Diagnosis present

## 2022-07-22 DIAGNOSIS — I509 Heart failure, unspecified: Secondary | ICD-10-CM | POA: Diagnosis present

## 2022-07-22 DIAGNOSIS — M1990 Unspecified osteoarthritis, unspecified site: Secondary | ICD-10-CM | POA: Diagnosis present

## 2022-07-22 DIAGNOSIS — Z96651 Presence of right artificial knee joint: Secondary | ICD-10-CM | POA: Diagnosis present

## 2022-07-22 DIAGNOSIS — J4489 Other specified chronic obstructive pulmonary disease: Secondary | ICD-10-CM | POA: Diagnosis present

## 2022-07-22 DIAGNOSIS — C220 Liver cell carcinoma: Secondary | ICD-10-CM | POA: Diagnosis present

## 2022-07-22 DIAGNOSIS — F32A Depression, unspecified: Secondary | ICD-10-CM | POA: Diagnosis present

## 2022-07-22 DIAGNOSIS — M25511 Pain in right shoulder: Secondary | ICD-10-CM | POA: Diagnosis not present

## 2022-07-22 DIAGNOSIS — Z79890 Hormone replacement therapy (postmenopausal): Secondary | ICD-10-CM | POA: Diagnosis not present

## 2022-07-22 DIAGNOSIS — Z90710 Acquired absence of both cervix and uterus: Secondary | ICD-10-CM | POA: Diagnosis not present

## 2022-07-22 DIAGNOSIS — K7581 Nonalcoholic steatohepatitis (NASH): Secondary | ICD-10-CM | POA: Diagnosis present

## 2022-07-22 DIAGNOSIS — Z8701 Personal history of pneumonia (recurrent): Secondary | ICD-10-CM | POA: Diagnosis not present

## 2022-07-22 DIAGNOSIS — J310 Chronic rhinitis: Secondary | ICD-10-CM | POA: Diagnosis present

## 2022-07-22 DIAGNOSIS — Z7984 Long term (current) use of oral hypoglycemic drugs: Secondary | ICD-10-CM | POA: Diagnosis not present

## 2022-07-22 DIAGNOSIS — K5900 Constipation, unspecified: Secondary | ICD-10-CM | POA: Diagnosis not present

## 2022-07-22 DIAGNOSIS — R55 Syncope and collapse: Secondary | ICD-10-CM | POA: Diagnosis not present

## 2022-07-22 DIAGNOSIS — E119 Type 2 diabetes mellitus without complications: Secondary | ICD-10-CM | POA: Diagnosis present

## 2022-07-22 DIAGNOSIS — F419 Anxiety disorder, unspecified: Secondary | ICD-10-CM | POA: Diagnosis present

## 2022-07-22 DIAGNOSIS — D862 Sarcoidosis of lung with sarcoidosis of lymph nodes: Secondary | ICD-10-CM | POA: Diagnosis present

## 2022-07-22 DIAGNOSIS — K683 Retroperitoneal hematoma: Secondary | ICD-10-CM | POA: Diagnosis not present

## 2022-07-22 DIAGNOSIS — K7460 Unspecified cirrhosis of liver: Secondary | ICD-10-CM | POA: Diagnosis present

## 2022-07-22 DIAGNOSIS — I2510 Atherosclerotic heart disease of native coronary artery without angina pectoris: Secondary | ICD-10-CM | POA: Diagnosis present

## 2022-07-22 DIAGNOSIS — Z9049 Acquired absence of other specified parts of digestive tract: Secondary | ICD-10-CM | POA: Diagnosis not present

## 2022-07-22 DIAGNOSIS — E039 Hypothyroidism, unspecified: Secondary | ICD-10-CM | POA: Diagnosis present

## 2022-07-22 LAB — CBC WITH DIFFERENTIAL/PLATELET
Abs Immature Granulocytes: 0.06 10*3/uL (ref 0.00–0.07)
Basophils Absolute: 0 10*3/uL (ref 0.0–0.1)
Basophils Relative: 0 %
Eosinophils Absolute: 0 10*3/uL (ref 0.0–0.5)
Eosinophils Relative: 0 %
HCT: 25.5 % — ABNORMAL LOW (ref 36.0–46.0)
Hemoglobin: 8.3 g/dL — ABNORMAL LOW (ref 12.0–15.0)
Immature Granulocytes: 0 %
Lymphocytes Relative: 7 %
Lymphs Abs: 1 10*3/uL (ref 0.7–4.0)
MCH: 28.4 pg (ref 26.0–34.0)
MCHC: 32.5 g/dL (ref 30.0–36.0)
MCV: 87.3 fL (ref 80.0–100.0)
Monocytes Absolute: 1.1 10*3/uL — ABNORMAL HIGH (ref 0.1–1.0)
Monocytes Relative: 8 %
Neutro Abs: 11.7 10*3/uL — ABNORMAL HIGH (ref 1.7–7.7)
Neutrophils Relative %: 85 %
Platelets: 131 10*3/uL — ABNORMAL LOW (ref 150–400)
RBC: 2.92 MIL/uL — ABNORMAL LOW (ref 3.87–5.11)
RDW: 14.4 % (ref 11.5–15.5)
WBC: 13.8 10*3/uL — ABNORMAL HIGH (ref 4.0–10.5)
nRBC: 0 % (ref 0.0–0.2)

## 2022-07-22 LAB — HEMOGLOBIN AND HEMATOCRIT, BLOOD
HCT: 25.7 % — ABNORMAL LOW (ref 36.0–46.0)
HCT: 25.7 % — ABNORMAL LOW (ref 36.0–46.0)
Hemoglobin: 8.1 g/dL — ABNORMAL LOW (ref 12.0–15.0)
Hemoglobin: 8.2 g/dL — ABNORMAL LOW (ref 12.0–15.0)

## 2022-07-22 MED ORDER — ALUM & MAG HYDROXIDE-SIMETH 200-200-20 MG/5ML PO SUSP
30.0000 mL | Freq: Four times a day (QID) | ORAL | Status: DC | PRN
  Administered 2022-07-22 – 2022-07-23 (×3): 30 mL via ORAL
  Filled 2022-07-22 (×3): qty 30

## 2022-07-22 MED ORDER — ACETAMINOPHEN 500 MG PO TABS
1000.0000 mg | ORAL_TABLET | Freq: Four times a day (QID) | ORAL | Status: DC | PRN
  Administered 2022-07-23 – 2022-07-24 (×2): 1000 mg via ORAL
  Filled 2022-07-22: qty 2

## 2022-07-22 NOTE — Progress Notes (Signed)
Unable to do orthostatic BP assessment at this time. Pt complains of being light headedness with movement.

## 2022-07-22 NOTE — Progress Notes (Signed)
MD Archer Asa was secured chat about patient's BP being low.

## 2022-07-22 NOTE — Progress Notes (Signed)
Referring Physician(s): Drazek,D  Supervising Physician: Malachy Moan  Patient Status:  [facility] - In-pt  Chief Complaint: Hepatocellular carcinoma , right lower abdominal discomfort, nausea, lightheadedness   Subjective: Pt with occ nausea, lightheadedness when attempting to ambulate, some RLQ discomfort; had near syncopal spell yesterday evening while going to restroom; denies fever/chills, HA, dyspnea, back pain or visible bleeding   Allergies: Sulfa antibiotics, Atorvastatin, Astelin [azelastine], and Amoxicillin  Medications: Prior to Admission medications   Medication Sig Start Date End Date Taking? Authorizing Provider  acetaminophen (TYLENOL) 325 MG tablet Take 650 mg by mouth every 6 (six) hours as needed for moderate pain.   Yes [provider]  ALPRAZolam (XANAX) 0.25 MG tablet Take 0.25 mg by mouth 3 (three) times daily as needed. 04/27/22  Yes [provider]  amLODipine (NORVASC) 10 MG tablet Take 1 tablet (10 mg total) by mouth daily. 11/04/21 10/30/22 Yes Jodelle Red, MD  aspirin EC 81 MG tablet Take 81 mg by mouth daily. Swallow whole.   Yes [provider]  Biotin 10 MG CAPS Take 10 mg by mouth daily.   Yes [provider]  Calcium Carb-Cholecalciferol (CALCIUM 600 + D PO) Take 1 tablet by mouth daily.   Yes [provider]  carvedilol (COREG) 25 MG tablet TAKE 1 TABLET BY MOUTH TWICE DAILY WITH A MEAL 05/06/22  Yes Jodelle Red, MD  docusate sodium (COLACE) 100 MG capsule Take 1 capsule (100 mg total) by mouth 2 (two) times daily. 05/20/21  Yes Cassandria Anger, PA-C  fluticasone (FLONASE) 50 MCG/ACT nasal spray Place 1 spray into both nostrils daily. 06/15/22  Yes Charlott Holler, MD  furosemide (LASIX) 20 MG tablet Take 20-40 mg by mouth See admin instructions. Alternate taking 20 mg one day and 40 mg the next   Yes [provider]  gabapentin (NEURONTIN) 300 MG capsule Take 300 mg by mouth  2 (two) times daily. 09/02/20  Yes [provider]  irbesartan (AVAPRO) 150 MG tablet Take 1 tablet by mouth once daily 03/10/22  Yes Jodelle Red, MD  levothyroxine (SYNTHROID, LEVOTHROID) 150 MCG tablet Take 75-150 mcg by mouth See admin instructions. Pt alternates taking 150 mcg one day and 75 mcg the next with before breakfast   Yes [provider]  metFORMIN (GLUCOPHAGE-XR) 500 MG 24 hr tablet Take 2 tablets (1,000 mg total) by mouth 2 (two) times daily. Patient taking differently: Take 500 mg by mouth daily with breakfast. 09/25/20  Yes Marinda Elk, MD  rosuvastatin (CRESTOR) 10 MG tablet Take 10 mg by mouth daily.   Yes [provider]  spironolactone (ALDACTONE) 25 MG tablet Take 25 mg by mouth daily. 11/22/21  Yes [provider]  Tetrahydrozoline-Zn Sulfate (ALLERGY RELIEF EYE DROPS OP) Place 1 drop into both eyes daily as needed (allergies).   Yes [provider]  tiZANidine (ZANAFLEX) 4 MG tablet Take 4 mg by mouth every 8 (eight) hours as needed for muscle spasms. 02/10/22  Yes [provider]  zolpidem (AMBIEN) 10 MG tablet Take 10 mg by mouth at bedtime.   Yes [provider]  acyclovir (ZOVIRAX) 400 MG tablet Take 400 mg by mouth every 8 (eight) hours as needed. Fever blisters 05/19/20   [provider]  albuterol (VENTOLIN HFA) 108 (90 Base) MCG/ACT inhaler Inhale 2 puffs into the lungs every 6 (six) hours as needed. 04/08/22   Charlott Holler, MD  traMADol (ULTRAM) 50 MG tablet Take 50 mg by  mouth daily as needed for moderate pain.    [provider]     Vital Signs: BP (!) 106/44 (BP Location: Right Arm)   Pulse 72   Temp 97.6 F (36.4 C) (Oral)   Resp 15   Ht 5\' 2"  (1.575 m)   Wt 175 lb 9.6 oz (79.7 kg)   SpO2 94%   BMI 32.12 kg/m   Physical Exam: awake/alert; chest- CTA bilat; heart- RRR,+murmur, abd- soft,+BS, some tenderness noted RLQ to palpation, puncture sites RUQ, rt CFA  soft, NT; no visible hematoma; intact distal pulses, tr- 1+ pretibial edema  Imaging: IR GUIDED LIVER TUMOR ABLATION RFA PERC  Result Date: 07/21/2022 INDICATION: History of hepatocellular carcinoma. Patient presents today for mesenteric arteriogram, bland embolization and percutaneous ultrasound and fluoroscopic guided hepatic lesion microwave ablation. Please refer to formal consultations dictated in the EMR (EPIC) on 06/03/2022 for additional details. EXAM: 1. ULTRASOUND GUIDANCE FOR ARTERIAL ACCESS 2. SELECTIVE CELIAC ARTERIOGRAM 3. SUB SELECTIVE ARTERIOGRAM AND FLUOROSCOPIC GUIDED BLAND EMBOLIZATION OF THE MEDIAL DIVISION OF THE RIGHT HEPATIC ARTERY 4. ULTRASOUND AND FLUOROSCOPIC GUIDED THERMAL ABLATION OF LIVER LESION ANESTHESIA/SEDATION: General MEDICATIONS: Rocephin 1 gm IV. The antibiotic was administered in an appropriate time interval prior to needle puncture of the skin. CONTRAST:  55 cc Omnipaque 300 COMPLICATIONS: None immediate. PROCEDURE: The procedure, risks, benefits, and alternatives were explained to the patient. Questions regarding the procedure were encouraged and answered. The patient understands and consents to the procedure. The patient was placed under general anesthesia. Sonographic evaluation performed of the right upper abdomen demonstrating the at least 3.2 cm hypoechoic mass within the superior aspect of the right lobe of the liver. The procedure was planned. Skin overlying the right groin as well as the right upper abdominal quadrant was prepped draped in sterile fashion. A time-out performed prior initiation of the procedure Initially, under direct ultrasound guidance, the right common femoral artery was accessed with a micropuncture kit allowing for placement of a 5 French vascular sheath. Closure arteriogram was performed via the side arm of the sheath. Next, over a Bentson wire, a Mickelson catheter was advanced to the level of the thoracic aorta where it was forearm, back  blood flushed. Catheter was then utilized to select the celiac artery and a selective celiac arteriogram was performed With the use of a fathom 14 microwire, a TriNav anti-reflux microcatheter was utilized to select the lateral division of the right hepatic artery. Sub selective arteriogram performed. Next, under direct ultrasound guidance, a Neuwave PR percutaneous microwave ablation probe was advanced into the cranial aspect of the hepatic lesion under direct ultrasound guidance. Ultrasound images were saved for procedural documentation purposes. Sub selective injection was then performed of the lateral division of the right hepatic artery microwave ablation probe was slightly repositioned under direct fluoroscopic guidance. At this time, bland embolization was performed of the lateral division right hepatic artery with approximately 1/3 vial of 100-300 micron Embospheres to vessel stasis. At this time, the microcatheter and Mickelson catheter were removed. Next, a 10-minute ablation was performed at 65 watts under intermittent sonographic guidance. While the microwave ablation was being performed, the vascular sheath removed and hemostasis was achieved at the right groin access site with manual compression. Following the 10 minute ablation, tract ablation was performed and microwave probe was removed intact. Hemostasis achieved with manual compression Dressings were applied at both locations the procedure was terminated. Patient tolerated the procedure well without immediate postprocedural complication. FINDINGS: Sonographic evaluation abdomen demonstrates an approximately  3.2 cm hypoechoic mass within the superior aspect of the right lobe of the liver, correlating with the findings seen preceding abdominal CTA. Celiac arteriogram demonstrates somewhat unconventional hepatic arterial anatomy with separate origins of the medial and lateral divisions of the left hepatic artery. The hypervascular mass within the  dome of the right lobe of the liver is supplied predominantly via lateral division of the right hepatic artery, confirmed with sub selective arteriogram of the lateral division right artery. Under a combination of ultrasound and fluoroscopic guidance, a PR microwave ablation probe was appropriately positioned within hypervascular tumor. Following probe positioning, bland embolization was performed tumor via the microcatheter. Following bland embolization, a 10 minute ablation was performed at 65 watts under intermittent sonographic guidance which demonstrated the ablation zone encompassing entirety of hypoechoic mass. 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 PLAN: - The patient will be admitted overnight for continued observation and PCA usage. - Follow-up telemedicine consultation will be performed in approximately 3-4 weeks. At that time, a CMP level be obtained. Initial postprocedural MRI will be performed in 3 months (mid February 2018). Electronically Signed   By: Simonne Come M.D.   On: 07/21/2022 12:57   IR EMBO TUMOR ORGAN ISCHEMIA INFARCT INC GUIDE ROADMAPPING  Result Date: 07/21/2022 INDICATION: History of hepatocellular carcinoma. Patient presents today for mesenteric arteriogram, bland embolization and percutaneous ultrasound and fluoroscopic guided hepatic lesion microwave ablation. Please refer to formal consultations dictated in the EMR (EPIC) on 06/03/2022 for additional details. EXAM: 1. ULTRASOUND GUIDANCE FOR ARTERIAL ACCESS 2. SELECTIVE CELIAC ARTERIOGRAM 3. SUB SELECTIVE ARTERIOGRAM AND FLUOROSCOPIC GUIDED BLAND EMBOLIZATION OF THE MEDIAL DIVISION OF THE RIGHT HEPATIC ARTERY 4. ULTRASOUND AND FLUOROSCOPIC GUIDED THERMAL ABLATION OF LIVER LESION ANESTHESIA/SEDATION: General MEDICATIONS: Rocephin 1 gm IV. The antibiotic was administered in an  appropriate time interval prior to needle puncture of the skin. CONTRAST:  55 cc Omnipaque 300 COMPLICATIONS: None immediate. PROCEDURE: The procedure, risks, benefits, and alternatives were explained to the patient. Questions regarding the procedure were encouraged and answered. The patient understands and consents to the procedure. The patient was placed under general anesthesia. Sonographic evaluation performed of the right upper abdomen demonstrating the at least 3.2 cm hypoechoic mass within the superior aspect of the right lobe of the liver. The procedure was planned. Skin overlying the right groin as well as the right upper abdominal quadrant was prepped draped in sterile fashion. A time-out performed prior initiation of the procedure Initially, under direct ultrasound guidance, the right common femoral artery was accessed with a micropuncture kit allowing for placement of a 5 French vascular sheath. Closure arteriogram was performed via the side arm of the sheath. Next, over a Bentson wire, a Mickelson catheter was advanced to the level of the thoracic aorta where it was forearm, back blood flushed. Catheter was then utilized to select the celiac artery and a selective celiac arteriogram was performed With the use of a fathom 14 microwire, a TriNav anti-reflux microcatheter was utilized to select the lateral division of the right hepatic artery. Sub selective arteriogram performed. Next, under direct ultrasound guidance, a Neuwave PR percutaneous microwave ablation probe was advanced into the cranial aspect of the hepatic lesion under direct ultrasound guidance. Ultrasound images were saved for procedural documentation purposes. Sub selective injection was then performed of the lateral division of the right hepatic artery microwave ablation probe was slightly repositioned under direct fluoroscopic guidance.  At this time, bland embolization was performed of the lateral division right hepatic artery with  approximately 1/3 vial of 100-300 micron Embospheres to vessel stasis. At this time, the microcatheter and Mickelson catheter were removed. Next, a 10-minute ablation was performed at 65 watts under intermittent sonographic guidance. While the microwave ablation was being performed, the vascular sheath removed and hemostasis was achieved at the right groin access site with manual compression. Following the 10 minute ablation, tract ablation was performed and microwave probe was removed intact. Hemostasis achieved with manual compression Dressings were applied at both locations the procedure was terminated. Patient tolerated the procedure well without immediate postprocedural complication. FINDINGS: Sonographic evaluation abdomen demonstrates an approximately 3.2 cm hypoechoic mass within the superior aspect of the right lobe of the liver, correlating with the findings seen preceding abdominal CTA. Celiac arteriogram demonstrates somewhat unconventional hepatic arterial anatomy with separate origins of the medial and lateral divisions of the left hepatic artery. The hypervascular mass within the dome of the right lobe of the liver is supplied predominantly via lateral division of the right hepatic artery, confirmed with sub selective arteriogram of the lateral division right artery. Under a combination of ultrasound and fluoroscopic guidance, a PR microwave ablation probe was appropriately positioned within hypervascular tumor. Following probe positioning, bland embolization was performed tumor via the microcatheter. Following bland embolization, a 10 minute ablation was performed at 65 watts under intermittent sonographic guidance which demonstrated the ablation zone encompassing entirety of hypoechoic mass. 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 PLAN: - The patient  will be admitted overnight for continued observation and PCA usage. - Follow-up telemedicine consultation will be performed in approximately 3-4 weeks. At that time, a CMP level be obtained. Initial postprocedural MRI will be performed in 3 months (mid February 2018). Electronically Signed   By: Simonne Come M.D.   On: 07/21/2022 12:57   IR 3D Independent Annabell Sabal  Result Date: 07/21/2022 INDICATION: History of hepatocellular carcinoma. Patient presents today for mesenteric arteriogram, bland embolization and percutaneous ultrasound and fluoroscopic guided hepatic lesion microwave ablation. Please refer to formal consultations dictated in the EMR (EPIC) on 06/03/2022 for additional details. EXAM: 1. ULTRASOUND GUIDANCE FOR ARTERIAL ACCESS 2. SELECTIVE CELIAC ARTERIOGRAM 3. SUB SELECTIVE ARTERIOGRAM AND FLUOROSCOPIC GUIDED BLAND EMBOLIZATION OF THE MEDIAL DIVISION OF THE RIGHT HEPATIC ARTERY 4. ULTRASOUND AND FLUOROSCOPIC GUIDED THERMAL ABLATION OF LIVER LESION ANESTHESIA/SEDATION: General MEDICATIONS: Rocephin 1 gm IV. The antibiotic was administered in an appropriate time interval prior to needle puncture of the skin. CONTRAST:  55 cc Omnipaque 300 COMPLICATIONS: None immediate. PROCEDURE: The procedure, risks, benefits, and alternatives were explained to the patient. Questions regarding the procedure were encouraged and answered. The patient understands and consents to the procedure. The patient was placed under general anesthesia. Sonographic evaluation performed of the right upper abdomen demonstrating the at least 3.2 cm hypoechoic mass within the superior aspect of the right lobe of the liver. The procedure was planned. Skin overlying the right groin as well as the right upper abdominal quadrant was prepped draped in sterile fashion. A time-out performed prior initiation of the procedure Initially, under direct ultrasound guidance, the right common femoral artery was accessed with a micropuncture kit allowing  for placement of a 5 French vascular sheath. Closure arteriogram was performed via the side arm of the sheath. Next, over a Bentson wire, a Mickelson catheter was advanced to the level  of the thoracic aorta where it was forearm, back blood flushed. Catheter was then utilized to select the celiac artery and a selective celiac arteriogram was performed With the use of a fathom 14 microwire, a TriNav anti-reflux microcatheter was utilized to select the lateral division of the right hepatic artery. Sub selective arteriogram performed. Next, under direct ultrasound guidance, a Neuwave PR percutaneous microwave ablation probe was advanced into the cranial aspect of the hepatic lesion under direct ultrasound guidance. Ultrasound images were saved for procedural documentation purposes. Sub selective injection was then performed of the lateral division of the right hepatic artery microwave ablation probe was slightly repositioned under direct fluoroscopic guidance. At this time, bland embolization was performed of the lateral division right hepatic artery with approximately 1/3 vial of 100-300 micron Embospheres to vessel stasis. At this time, the microcatheter and Mickelson catheter were removed. Next, a 10-minute ablation was performed at 65 watts under intermittent sonographic guidance. While the microwave ablation was being performed, the vascular sheath removed and hemostasis was achieved at the right groin access site with manual compression. Following the 10 minute ablation, tract ablation was performed and microwave probe was removed intact. Hemostasis achieved with manual compression Dressings were applied at both locations the procedure was terminated. Patient tolerated the procedure well without immediate postprocedural complication. FINDINGS: Sonographic evaluation abdomen demonstrates an approximately 3.2 cm hypoechoic mass within the superior aspect of the right lobe of the liver, correlating with the findings  seen preceding abdominal CTA. Celiac arteriogram demonstrates somewhat unconventional hepatic arterial anatomy with separate origins of the medial and lateral divisions of the left hepatic artery. The hypervascular mass within the dome of the right lobe of the liver is supplied predominantly via lateral division of the right hepatic artery, confirmed with sub selective arteriogram of the lateral division right artery. Under a combination of ultrasound and fluoroscopic guidance, a PR microwave ablation probe was appropriately positioned within hypervascular tumor. Following probe positioning, bland embolization was performed tumor via the microcatheter. Following bland embolization, a 10 minute ablation was performed at 65 watts under intermittent sonographic guidance which demonstrated the ablation zone encompassing entirety of hypoechoic mass. 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 PLAN: - The patient will be admitted overnight for continued observation and PCA usage. - Follow-up telemedicine consultation will be performed in approximately 3-4 weeks. At that time, a CMP level be obtained. Initial postprocedural MRI will be performed in 3 months (mid February 2018). Electronically Signed   By: Simonne Come M.D.   On: 07/21/2022 12:57   IR US Guide Vasc Access Right  Result Date: 07/21/2022 INDICATION: History of hepatocellular carcinoma. Patient presents today for mesenteric arteriogram, bland embolization and percutaneous ultrasound and fluoroscopic guided hepatic lesion microwave ablation. Please refer to formal consultations dictated in the EMR (EPIC) on 06/03/2022 for additional details. EXAM: 1. ULTRASOUND GUIDANCE FOR ARTERIAL ACCESS 2. SELECTIVE CELIAC ARTERIOGRAM 3. SUB SELECTIVE ARTERIOGRAM AND FLUOROSCOPIC GUIDED BLAND EMBOLIZATION OF THE MEDIAL DIVISION  OF THE RIGHT HEPATIC ARTERY 4. ULTRASOUND AND FLUOROSCOPIC GUIDED THERMAL ABLATION OF LIVER LESION ANESTHESIA/SEDATION: General MEDICATIONS: Rocephin 1 gm IV. The antibiotic was administered in an appropriate time interval prior to needle puncture of the skin. CONTRAST:  55 cc Omnipaque 300 COMPLICATIONS: None immediate. PROCEDURE: The procedure, risks, benefits, and alternatives were explained to the patient. Questions regarding the procedure were encouraged and answered. The patient understands and consents to the  procedure. The patient was placed under general anesthesia. Sonographic evaluation performed of the right upper abdomen demonstrating the at least 3.2 cm hypoechoic mass within the superior aspect of the right lobe of the liver. The procedure was planned. Skin overlying the right groin as well as the right upper abdominal quadrant was prepped draped in sterile fashion. A time-out performed prior initiation of the procedure Initially, under direct ultrasound guidance, the right common femoral artery was accessed with a micropuncture kit allowing for placement of a 5 French vascular sheath. Closure arteriogram was performed via the side arm of the sheath. Next, over a Bentson wire, a Mickelson catheter was advanced to the level of the thoracic aorta where it was forearm, back blood flushed. Catheter was then utilized to select the celiac artery and a selective celiac arteriogram was performed With the use of a fathom 14 microwire, a TriNav anti-reflux microcatheter was utilized to select the lateral division of the right hepatic artery. Sub selective arteriogram performed. Next, under direct ultrasound guidance, a Neuwave PR percutaneous microwave ablation probe was advanced into the cranial aspect of the hepatic lesion under direct ultrasound guidance. Ultrasound images were saved for procedural documentation purposes. Sub selective injection was then performed of the lateral division of the right hepatic  artery microwave ablation probe was slightly repositioned under direct fluoroscopic guidance. At this time, bland embolization was performed of the lateral division right hepatic artery with approximately 1/3 vial of 100-300 micron Embospheres to vessel stasis. At this time, the microcatheter and Mickelson catheter were removed. Next, a 10-minute ablation was performed at 65 watts under intermittent sonographic guidance. While the microwave ablation was being performed, the vascular sheath removed and hemostasis was achieved at the right groin access site with manual compression. Following the 10 minute ablation, tract ablation was performed and microwave probe was removed intact. Hemostasis achieved with manual compression Dressings were applied at both locations the procedure was terminated. Patient tolerated the procedure well without immediate postprocedural complication. FINDINGS: Sonographic evaluation abdomen demonstrates an approximately 3.2 cm hypoechoic mass within the superior aspect of the right lobe of the liver, correlating with the findings seen preceding abdominal CTA. Celiac arteriogram demonstrates somewhat unconventional hepatic arterial anatomy with separate origins of the medial and lateral divisions of the left hepatic artery. The hypervascular mass within the dome of the right lobe of the liver is supplied predominantly via lateral division of the right hepatic artery, confirmed with sub selective arteriogram of the lateral division right artery. Under a combination of ultrasound and fluoroscopic guidance, a PR microwave ablation probe was appropriately positioned within hypervascular tumor. Following probe positioning, bland embolization was performed tumor via the microcatheter. Following bland embolization, a 10 minute ablation was performed at 65 watts under intermittent sonographic guidance which demonstrated the ablation zone encompassing entirety of hypoechoic mass. IMPRESSION:  Technically successful bland embolization and ultrasound/fluoroscopic guided microwave ablation of solitary focus of hepatocellular carcinoma within the right lobe of the liver. PLAN: - The patient will be admitted overnight for continued observation and PCA usage. - Follow-up telemedicine consultation will be performed in approximately 3-4 weeks. At that time, a CMP level be obtained. Initial postprocedural MRI will be performed in 3 months (mid February 2018). Electronically Signed   By: Simonne Come M.D.   On: 07/21/2022 12:57   IR Angiogram Visceral Selective  Result Date: 07/21/2022 INDICATION: History of hepatocellular carcinoma. Patient presents today for mesenteric arteriogram, bland embolization and percutaneous ultrasound and fluoroscopic guided hepatic lesion microwave ablation.  Please refer to formal consultations dictated in the EMR (EPIC) on 06/03/2022 for additional details. EXAM: 1. ULTRASOUND GUIDANCE FOR ARTERIAL ACCESS 2. SELECTIVE CELIAC ARTERIOGRAM 3. SUB SELECTIVE ARTERIOGRAM AND FLUOROSCOPIC GUIDED BLAND EMBOLIZATION OF THE MEDIAL DIVISION OF THE RIGHT HEPATIC ARTERY 4. ULTRASOUND AND FLUOROSCOPIC GUIDED THERMAL ABLATION OF LIVER LESION ANESTHESIA/SEDATION: General MEDICATIONS: Rocephin 1 gm IV. The antibiotic was administered in an appropriate time interval prior to needle puncture of the skin. CONTRAST:  55 cc Omnipaque 300 COMPLICATIONS: None immediate. PROCEDURE: The procedure, risks, benefits, and alternatives were explained to the patient. Questions regarding the procedure were encouraged and answered. The patient understands and consents to the procedure. The patient was placed under general anesthesia. Sonographic evaluation performed of the right upper abdomen demonstrating the at least 3.2 cm hypoechoic mass within the superior aspect of the right lobe of the liver. The procedure was planned. Skin overlying the right groin as well as the right upper abdominal quadrant was prepped  draped in sterile fashion. A time-out performed prior initiation of the procedure Initially, under direct ultrasound guidance, the right common femoral artery was accessed with a micropuncture kit allowing for placement of a 5 French vascular sheath. Closure arteriogram was performed via the side arm of the sheath. Next, over a Bentson wire, a Mickelson catheter was advanced to the level of the thoracic aorta where it was forearm, back blood flushed. Catheter was then utilized to select the celiac artery and a selective celiac arteriogram was performed With the use of a fathom 14 microwire, a TriNav anti-reflux microcatheter was utilized to select the lateral division of the right hepatic artery. Sub selective arteriogram performed. Next, under direct ultrasound guidance, a Neuwave PR percutaneous microwave ablation probe was advanced into the cranial aspect of the hepatic lesion under direct ultrasound guidance. Ultrasound images were saved for procedural documentation purposes. Sub selective injection was then performed of the lateral division of the right hepatic artery microwave ablation probe was slightly repositioned under direct fluoroscopic guidance. At this time, bland embolization was performed of the lateral division right hepatic artery with approximately 1/3 vial of 100-300 micron Embospheres to vessel stasis. At this time, the microcatheter and Mickelson catheter were removed. Next, a 10-minute ablation was performed at 65 watts under intermittent sonographic guidance. While the microwave ablation was being performed, the vascular sheath removed and hemostasis was achieved at the right groin access site with manual compression. Following the 10 minute ablation, tract ablation was performed and microwave probe was removed intact. Hemostasis achieved with manual compression Dressings were applied at both locations the procedure was terminated. Patient tolerated the procedure well without immediate  postprocedural complication. FINDINGS: Sonographic evaluation abdomen demonstrates an approximately 3.2 cm hypoechoic mass within the superior aspect of the right lobe of the liver, correlating with the findings seen preceding abdominal CTA. Celiac arteriogram demonstrates somewhat unconventional hepatic arterial anatomy with separate origins of the medial and lateral divisions of the left hepatic artery. The hypervascular mass within the dome of the right lobe of the liver is supplied predominantly via lateral division of the right hepatic artery, confirmed with sub selective arteriogram of the lateral division right artery. Under a combination of ultrasound and fluoroscopic guidance, a PR microwave ablation probe was appropriately positioned within hypervascular tumor. Following probe positioning, bland embolization was performed tumor via the microcatheter. Following bland embolization, a 10 minute ablation was performed at 65 watts under intermittent sonographic guidance which demonstrated the ablation zone encompassing entirety of hypoechoic mass. 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 PLAN: - The patient will be admitted overnight for continued observation and PCA usage. - Follow-up telemedicine consultation will be performed in approximately 3-4 weeks. At that time, a CMP level be obtained. Initial postprocedural MRI will be performed in 3 months (mid February 2018). Electronically Signed   By: Simonne Come M.D.   On: 07/21/2022 12:57   IR Angiogram Selective Each Additional Vessel  Result Date: 07/21/2022 INDICATION: History of hepatocellular carcinoma. Patient presents today for mesenteric arteriogram, bland embolization and percutaneous ultrasound and fluoroscopic guided hepatic lesion microwave ablation. Please refer to formal consultations dictated in  the EMR (EPIC) on 06/03/2022 for additional details. EXAM: 1. ULTRASOUND GUIDANCE FOR ARTERIAL ACCESS 2. SELECTIVE CELIAC ARTERIOGRAM 3. SUB SELECTIVE ARTERIOGRAM AND FLUOROSCOPIC GUIDED BLAND EMBOLIZATION OF THE MEDIAL DIVISION OF THE RIGHT HEPATIC ARTERY 4. ULTRASOUND AND FLUOROSCOPIC GUIDED THERMAL ABLATION OF LIVER LESION ANESTHESIA/SEDATION: General MEDICATIONS: Rocephin 1 gm IV. The antibiotic was administered in an appropriate time interval prior to needle puncture of the skin. CONTRAST:  55 cc Omnipaque 300 COMPLICATIONS: None immediate. PROCEDURE: The procedure, risks, benefits, and alternatives were explained to the patient. Questions regarding the procedure were encouraged and answered. The patient understands and consents to the procedure. The patient was placed under general anesthesia. Sonographic evaluation performed of the right upper abdomen demonstrating the at least 3.2 cm hypoechoic mass within the superior aspect of the right lobe of the liver. The procedure was planned. Skin overlying the right groin as well as the right upper abdominal quadrant was prepped draped in sterile fashion. A time-out performed prior initiation of the procedure Initially, under direct ultrasound guidance, the right common femoral artery was accessed with a micropuncture kit allowing for placement of a 5 French vascular sheath. Closure arteriogram was performed via the side arm of the sheath. Next, over a Bentson wire, a Mickelson catheter was advanced to the level of the thoracic aorta where it was forearm, back blood flushed. Catheter was then utilized to select the celiac artery and a selective celiac arteriogram was performed With the use of a fathom 14 microwire, a TriNav anti-reflux microcatheter was utilized to select the lateral division of the right hepatic artery. Sub selective arteriogram performed. Next, under direct ultrasound guidance, a Neuwave PR percutaneous microwave ablation probe was advanced into  the cranial aspect of the hepatic lesion under direct ultrasound guidance. Ultrasound images were saved for procedural documentation purposes. Sub selective injection was then performed of the lateral division of the right hepatic artery microwave ablation probe was slightly repositioned under direct fluoroscopic guidance. At this time, bland embolization was performed of the lateral division right hepatic artery with approximately 1/3 vial of 100-300 micron Embospheres to vessel stasis. At this time, the microcatheter and Mickelson catheter were removed. Next, a 10-minute ablation was performed at 65 watts under intermittent sonographic guidance. While the microwave ablation was being performed, the vascular sheath removed and hemostasis was achieved at the right groin access site with manual compression. Following the 10 minute ablation, tract ablation was performed and microwave probe was removed intact. Hemostasis achieved with manual compression Dressings were applied at both locations the procedure was terminated. Patient tolerated the procedure well without immediate postprocedural complication. FINDINGS: Sonographic evaluation abdomen demonstrates an approximately 3.2 cm hypoechoic mass within the superior aspect of the right lobe of the liver, correlating with the findings seen preceding abdominal CTA.  Celiac arteriogram demonstrates somewhat unconventional hepatic arterial anatomy with separate origins of the medial and lateral divisions of the left hepatic artery. The hypervascular mass within the dome of the right lobe of the liver is supplied predominantly via lateral division of the right hepatic artery, confirmed with sub selective arteriogram of the lateral division right artery. Under a combination of ultrasound and fluoroscopic guidance, a PR microwave ablation probe was appropriately positioned within hypervascular tumor. Following probe positioning, bland embolization was performed tumor via the  microcatheter. Following bland embolization, a 10 minute ablation was performed at 65 watts under intermittent sonographic guidance which demonstrated the ablation zone encompassing entirety of hypoechoic mass. 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 PLAN: - The patient will be admitted overnight for continued observation and PCA usage. - Follow-up telemedicine consultation will be performed in approximately 3-4 weeks. At that time, a CMP level be obtained. Initial postprocedural MRI will be performed in 3 months (mid February 2018). Electronically Signed   By: Simonne Come M.D.   On: 07/21/2022 12:57    Labs:  CBC: Recent Labs    04/30/22 1231 06/30/22 0925 07/21/22 0700 07/21/22 2138  WBC 4.4 3.9* 4.0 13.7*  HGB 12.2 12.3 12.4 9.7*  HCT 36.8 39.0 39.2 31.7*  PLT 98* 95* 94* 174    COAGS: Recent Labs    04/13/22 0948 06/30/22 0925  INR 1.1 1.0    BMP: Recent Labs    04/13/22 0948 04/30/22 1231 06/30/22 0925 07/21/22 0700  NA 138 140 137 135  K 3.5 4.4 4.6 4.3  CL 107 109 107 107  CO2 24 27 22  20*  GLUCOSE 110* 90 122* 133*  BUN 12 12 26* 32*  CALCIUM 9.1 10.0 9.7 9.6  CREATININE 0.63 0.72 0.64 0.68  GFRNONAA >60 >60 >60 >60    LIVER FUNCTION TESTS: Recent Labs    04/13/22 0948 04/30/22 1231 06/30/22 0925 07/21/22 0700  BILITOT 0.8 0.9 0.7 0.5  AST 54* 56* 55* 49*  ALT 44 46* 69* 53*  ALKPHOS 105 109 123 113  PROT 7.7 7.6 7.3 7.5  ALBUMIN 3.8 3.8 3.7 3.6    Assessment and Plan: 72 year old female with past medical history of anxiety, arthritis, asthma, coronary artery disease, CHF, COPD, depression, diabetes, hypertension, hypothyroidism, mild aortic stenosis, sarcoidosis, and NASH cirrhosis. She was found to have a worrisome enhancing liver lesion on chest CT done  04/13/2022, performed for the evaluation of  hemoptysis.  Subsequent abdominal MRI performed 04/22/2022 confirmed the presence of an approximately 3.1 x 3.1 cm enhancing lesion within segment 8 of the right lobe of the liver, characterized as LI-RADS 5, definitive HCC.The patient was subsequently  evaluated by Dr. Freida Busman of the hepatobiliary surgical service and deemed a nonoperative candidate given her medical comorbidities and degree of cirrhosis.The patient was then evaluated by Annamarie Major from the hepatobiliary transplant team and the patient was referred for consideration of percutaneous management.  Latest AFP on 04/30/2022 was 4.1.  Following discussions with Dr. Grace Isaac patient was deemed an appropriate candidate for transcatheter bland embolization followed by percutaneous microwave ablation of the right hepatocellular carcinoma via general anesthesia; s/p mesenteric arteriogram and bland embolization of right HCC/percutaneous [place] and fluoro guided ablation of  right HCC 7/3; now with intermittent nausea, RLQ discomfort, lightheadedness with ambulation/upright position, hgb 9.7 yesterday evening (down from 12.4); case d/w Dr. Archer Asa- will recheck CBC this am,  obtain f/u CT A/P to r/o bleeding   Electronically Signed: D. Jeananne Rama, PA-C 07/22/2022, 8:34 AM   I spent a total of 15 Minutes at the the patient's bedside AND on the patient's hospital floor or unit, greater than 50% of which was counseling/coordinating care for mesenteric /hepatic arteriogram with thermal ablation/bland embolization of hepatocellular carcinoma   Patient ID: [name], female   DOB: 08-06-1950, 72 y.o.   MRN: [id number]

## 2022-07-22 NOTE — Progress Notes (Signed)
   07/22/22 1050  TOC Brief Assessment  Insurance and Status Reviewed  Patient has primary care physician Yes  Home environment has been reviewed home with spouse  Prior level of function: independent  Prior/Current Home Services No current home services  Social Determinants of Health Reivew SDOH reviewed no interventions necessary  Readmission risk has been reviewed Yes  Transition of care needs no transition of care needs at this time

## 2022-07-22 NOTE — Progress Notes (Signed)
Wasted 22ml fentanyl IV with Toni Amend, RN

## 2022-07-22 NOTE — Progress Notes (Signed)
Patient ID: [name], female   DOB: [birth year], 72 y.o.   MRN: [id number] Pt's f/u CT A/P today reviewed by Dr. Archer Asa and reveals mod RP hematoma; pt will be placed on strict bedrest for now with q 6 hr hgb/hct checks. Nurse/pt/spouse updated with plans; Nurse told to contact Dr. Archer Asa with any acute changes in pt status.

## 2022-07-23 ENCOUNTER — Encounter (HOSPITAL_COMMUNITY): Payer: Self-pay | Admitting: Interventional Radiology

## 2022-07-23 LAB — COMPREHENSIVE METABOLIC PANEL
ALT: 141 U/L — ABNORMAL HIGH (ref 0–44)
AST: 132 U/L — ABNORMAL HIGH (ref 15–41)
Albumin: 3.1 g/dL — ABNORMAL LOW (ref 3.5–5.0)
Alkaline Phosphatase: 80 U/L (ref 38–126)
Anion gap: 6 (ref 5–15)
BUN: 52 mg/dL — ABNORMAL HIGH (ref 8–23)
CO2: 22 mmol/L (ref 22–32)
Calcium: 9.1 mg/dL (ref 8.9–10.3)
Chloride: 107 mmol/L (ref 98–111)
Creatinine, Ser: 1.01 mg/dL — ABNORMAL HIGH (ref 0.44–1.00)
GFR, Estimated: 59 mL/min — ABNORMAL LOW (ref >60)
Glucose, Bld: 133 mg/dL — ABNORMAL HIGH (ref 70–99)
Potassium: 4.9 mmol/L (ref 3.5–5.1)
Sodium: 135 mmol/L (ref 135–145)
Total Bilirubin: 0.3 mg/dL (ref 0.3–1.2)
Total Protein: 6 g/dL — ABNORMAL LOW (ref 6.5–8.1)

## 2022-07-23 LAB — CBC
HCT: 30 % — ABNORMAL LOW (ref 36.0–46.0)
Hemoglobin: 9.8 g/dL — ABNORMAL LOW (ref 12.0–15.0)
MCH: 28.5 pg (ref 26.0–34.0)
MCHC: 32.7 g/dL (ref 30.0–36.0)
MCV: 87.2 fL (ref 80.0–100.0)
Platelets: 80 10*3/uL — ABNORMAL LOW (ref 150–400)
RBC: 3.44 MIL/uL — ABNORMAL LOW (ref 3.87–5.11)
RDW: 14.4 % (ref 11.5–15.5)
WBC: 8.2 10*3/uL (ref 4.0–10.5)
nRBC: 0 % (ref 0.0–0.2)

## 2022-07-23 LAB — BPAM RBC
Blood Product Expiration Date: 202407282359
Unit Type and Rh: 9500

## 2022-07-23 LAB — SAMPLE TO BLOOD BANK

## 2022-07-23 LAB — TYPE AND SCREEN: Antibody Screen: NEGATIVE

## 2022-07-23 LAB — PREPARE RBC (CROSSMATCH)

## 2022-07-23 LAB — HEMOGLOBIN AND HEMATOCRIT, BLOOD
HCT: 24.4 % — ABNORMAL LOW (ref 36.0–46.0)
HCT: 22.8 % — ABNORMAL LOW (ref 36.0–46.0)
Hemoglobin: 7.7 g/dL — ABNORMAL LOW (ref 12.0–15.0)
Hemoglobin: 7.3 g/dL — ABNORMAL LOW (ref 12.0–15.0)

## 2022-07-23 MED ORDER — SODIUM CHLORIDE 0.9% IV SOLUTION: Freq: Once | INTRAVENOUS | Status: DC

## 2022-07-23 MED ORDER — LACTATED RINGERS IV SOLN: INTRAVENOUS | Status: DC

## 2022-07-23 MED ORDER — GLYCERIN (LAXATIVE) 2.1 G RE SUPP
1.0000 | Freq: Once | RECTAL | Status: AC
  Administered 2022-07-23: 1 via RECTAL
  Filled 2022-07-23 (×2): qty 1

## 2022-07-23 NOTE — Progress Notes (Signed)
MD Archer Asa was notified that patient's hemoglobin was 7.7

## 2022-07-23 NOTE — Progress Notes (Signed)
Referring Physician(s): Drazek,D  Supervising Physician: Gilmer Mor  Patient Status:  [facility] - In-pt  Chief Complaint:  Hepatocellular carcinoma, RLQ discomfort, RP hematoma, constipation  Subjective: Pt feeling a little better this pm; receiving blood transfusion(2 units for hgb 7.3 earlier today), IVF; denies fever, HA,CP,worsening dyspnea, cough, N/V; still has some RLQ soreness/intermittent back pain; also constipated and asking for suppository   Allergies: Sulfa antibiotics, Atorvastatin, Astelin [azelastine], and Amoxicillin  Medications: Prior to Admission medications   Medication Sig Start Date End Date Taking? Authorizing Provider  acetaminophen (TYLENOL) 325 MG tablet Take 650 mg by mouth every 6 (six) hours as needed for moderate pain.   Yes [provider]  ALPRAZolam (XANAX) 0.25 MG tablet Take 0.25 mg by mouth 3 (three) times daily as needed. 04/27/22  Yes [provider]  amLODipine (NORVASC) 10 MG tablet Take 1 tablet (10 mg total) by mouth daily. 11/04/21 10/30/22 Yes Jodelle Red, MD  aspirin EC 81 MG tablet Take 81 mg by mouth daily. Swallow whole.   Yes [provider]  Biotin 10 MG CAPS Take 10 mg by mouth daily.   Yes [provider]  Calcium Carb-Cholecalciferol (CALCIUM 600 + D PO) Take 1 tablet by mouth daily.   Yes [provider]  carvedilol (COREG) 25 MG tablet TAKE 1 TABLET BY MOUTH TWICE DAILY WITH A MEAL 05/06/22  Yes Jodelle Red, MD  docusate sodium (COLACE) 100 MG capsule Take 1 capsule (100 mg total) by mouth 2 (two) times daily. 05/20/21  Yes Cassandria Anger, PA-C  fluticasone (FLONASE) 50 MCG/ACT nasal spray Place 1 spray into both nostrils daily. 06/15/22  Yes Charlott Holler, MD  furosemide (LASIX) 20 MG tablet Take 20-40 mg by mouth See admin instructions. Alternate taking 20 mg one day and 40 mg the next   Yes [provider]  gabapentin (NEURONTIN) 300 MG capsule Take  300 mg by mouth 2 (two) times daily. 09/02/20  Yes [provider]  irbesartan (AVAPRO) 150 MG tablet Take 1 tablet by mouth once daily 03/10/22  Yes Jodelle Red, MD  levothyroxine (SYNTHROID, LEVOTHROID) 150 MCG tablet Take 75-150 mcg by mouth See admin instructions. Pt alternates taking 150 mcg one day and 75 mcg the next with before breakfast   Yes [provider]  metFORMIN (GLUCOPHAGE-XR) 500 MG 24 hr tablet Take 2 tablets (1,000 mg total) by mouth 2 (two) times daily. Patient taking differently: Take 500 mg by mouth daily with breakfast. 09/25/20  Yes Marinda Elk, MD  rosuvastatin (CRESTOR) 10 MG tablet Take 10 mg by mouth daily.   Yes [provider]  spironolactone (ALDACTONE) 25 MG tablet Take 25 mg by mouth daily. 11/22/21  Yes [provider]  Tetrahydrozoline-Zn Sulfate (ALLERGY RELIEF EYE DROPS OP) Place 1 drop into both eyes daily as needed (allergies).   Yes [provider]  tiZANidine (ZANAFLEX) 4 MG tablet Take 4 mg by mouth every 8 (eight) hours as needed for muscle spasms. 02/10/22  Yes [provider]  zolpidem (AMBIEN) 10 MG tablet Take 10 mg by mouth at bedtime.   Yes [provider]  acyclovir (ZOVIRAX) 400 MG tablet Take 400 mg by mouth every 8 (eight) hours as needed. Fever blisters 05/19/20   [provider]  albuterol (VENTOLIN HFA) 108 (90 Base) MCG/ACT inhaler Inhale 2 puffs into the lungs every 6 (six) hours as needed. 04/08/22   Charlott Holler, MD  traMADol Janean Sark) 50 MG tablet Take  50 mg by mouth daily as needed for moderate pain.    [provider]     Vital Signs: BP (!) 121/40 (BP Location: Right Arm)   Pulse 86   Temp 98.6 F (37 C) (Oral)   Resp 16   Ht 5\' 2"  (1.575 m)   Wt 175 lb 9.6 oz (79.7 kg)   SpO2 97%   BMI 32.12 kg/m   Physical Exam: awake/alert; chest- CTA bilat; heart- RRR,+murmur, abd- soft, +BS, some mild-mod tenderness RLQ region; puncture site  RUQ abd clean and dry,NT; rt CFA access site soft,clean and dry, no visible ecchymosis , mildly tender; intact distal pulses  Imaging: CT ABDOMEN PELVIS WO CONTRAST  Result Date: 07/22/2022 CLINICAL DATA:  72 year old female status post transarterial bland embolization via right groin puncture with concurrent microwave ablation of hepatocellular carcinoma. She has new onset dizziness, presyncope and decreasing hemoglobin concerning for hemorrhage. EXAM: CT ABDOMEN AND PELVIS WITHOUT CONTRAST TECHNIQUE: Multidetector CT imaging of the abdomen and pelvis was performed following the standard protocol without IV contrast. RADIATION DOSE REDUCTION: This exam was performed according to the departmental dose-optimization program which includes automated exposure control, adjustment of the mA and/or kV according to patient size and/or use of iterative reconstruction technique. COMPARISON:  Prior CT scan of the abdomen and pelvis 06/16/2022 FINDINGS: Lower chest: No acute abnormality. Hepatobiliary: Hyperdense contrast with a few locules of contained air present within the central dome of the right liver consistent with recent bland embolization and percutaneous ablation. The ablation tract is faintly visible extending to the liver capsule. No evidence of perihepatic hemorrhage. Cirrhotic liver morphology again noted. The gallbladder is surgically absent. No biliary ductal dilatation. Pancreas: Unremarkable. No pancreatic ductal dilatation or surrounding inflammatory changes. Spleen: Normal in size without focal abnormality. Adrenals/Urinary Tract: Adrenal glands are unremarkable. Kidneys are normal, without renal calculi, focal lesion, or hydronephrosis. Bladder is displaced from right to left due to the adjacent hematoma. Small amount of air in the bladder consistent with recent Foley catheter placement. Stomach/Bowel: No evidence of obstruction or focal bowel wall thickening. Vascular/Lymphatic: Limited evaluation in  the absence of intravenous contrast. No evidence of aneurysm. Scattered atherosclerotic plaque throughout the abdominal aorta and branch arteries. Reproductive: Status post hysterectomy. No adnexal masses. Other: High density amorphous fluid tracks along the right pelvic sidewall and into the inferior aspect of the right retroperitoneal space. Findings are consistent with hematoma. There is mild local mass effect with displacement of the bladder from the right to the left. Precise measurement is difficult given the irregularity of the shape. The most concentrated portion of the hematoma in the low right pelvic sidewall measures approximately 10.4 x 5.5 cm (image 74 series 2). Musculoskeletal: No acute or significant osseous findings. IMPRESSION: 1. Positive for moderate retroperitoneal hematoma resulting in local mass effect and displacement of the bladder from the right to the left. 2. Expected appearance of transarterial bland embolization and percutaneous microwave ablation site in the hepatic dome. No evidence of bleeding or complication. 3. Hepatic cirrhosis. Electronically Signed   By: Malachy Moan M.D.   On: 07/22/2022 09:57   IR GUIDED LIVER TUMOR ABLATION RFA PERC  Result Date: 07/21/2022 INDICATION: History of hepatocellular carcinoma. Patient presents today for mesenteric arteriogram, bland embolization and percutaneous ultrasound and fluoroscopic guided hepatic lesion microwave ablation. Please refer to formal consultations dictated in the EMR (EPIC) on 06/03/2022 for additional details. EXAM: 1. ULTRASOUND GUIDANCE FOR ARTERIAL ACCESS 2. SELECTIVE CELIAC ARTERIOGRAM 3. SUB SELECTIVE ARTERIOGRAM AND  FLUOROSCOPIC GUIDED BLAND EMBOLIZATION OF THE MEDIAL DIVISION OF THE RIGHT HEPATIC ARTERY 4. ULTRASOUND AND FLUOROSCOPIC GUIDED THERMAL ABLATION OF LIVER LESION ANESTHESIA/SEDATION: General MEDICATIONS: Rocephin 1 gm IV. The antibiotic was administered in an appropriate time interval prior to needle  puncture of the skin. CONTRAST:  55 cc Omnipaque 300 COMPLICATIONS: None immediate. PROCEDURE: The procedure, risks, benefits, and alternatives were explained to the patient. Questions regarding the procedure were encouraged and answered. The patient understands and consents to the procedure. The patient was placed under general anesthesia. Sonographic evaluation performed of the right upper abdomen demonstrating the at least 3.2 cm hypoechoic mass within the superior aspect of the right lobe of the liver. The procedure was planned. Skin overlying the right groin as well as the right upper abdominal quadrant was prepped draped in sterile fashion. A time-out performed prior initiation of the procedure Initially, under direct ultrasound guidance, the right common femoral artery was accessed with a micropuncture kit allowing for placement of a 5 French vascular sheath. Closure arteriogram was performed via the side arm of the sheath. Next, over a Bentson wire, a Mickelson catheter was advanced to the level of the thoracic aorta where it was forearm, back blood flushed. Catheter was then utilized to select the celiac artery and a selective celiac arteriogram was performed With the use of a fathom 14 microwire, a TriNav anti-reflux microcatheter was utilized to select the lateral division of the right hepatic artery. Sub selective arteriogram performed. Next, under direct ultrasound guidance, a Neuwave PR percutaneous microwave ablation probe was advanced into the cranial aspect of the hepatic lesion under direct ultrasound guidance. Ultrasound images were saved for procedural documentation purposes. Sub selective injection was then performed of the lateral division of the right hepatic artery microwave ablation probe was slightly repositioned under direct fluoroscopic guidance. At this time, bland embolization was performed of the lateral division right hepatic artery with approximately 1/3 vial of 100-300 micron  Embospheres to vessel stasis. At this time, the microcatheter and Mickelson catheter were removed. Next, a 10-minute ablation was performed at 65 watts under intermittent sonographic guidance. While the microwave ablation was being performed, the vascular sheath removed and hemostasis was achieved at the right groin access site with manual compression. Following the 10 minute ablation, tract ablation was performed and microwave probe was removed intact. Hemostasis achieved with manual compression Dressings were applied at both locations the procedure was terminated. Patient tolerated the procedure well without immediate postprocedural complication. FINDINGS: Sonographic evaluation abdomen demonstrates an approximately 3.2 cm hypoechoic mass within the superior aspect of the right lobe of the liver, correlating with the findings seen preceding abdominal CTA. Celiac arteriogram demonstrates somewhat unconventional hepatic arterial anatomy with separate origins of the medial and lateral divisions of the left hepatic artery. The hypervascular mass within the dome of the right lobe of the liver is supplied predominantly via lateral division of the right hepatic artery, confirmed with sub selective arteriogram of the lateral division right artery. Under a combination of ultrasound and fluoroscopic guidance, a PR microwave ablation probe was appropriately positioned within hypervascular tumor. Following probe positioning, bland embolization was performed tumor via the microcatheter. Following bland embolization, a 10 minute ablation was performed at 65 watts under intermittent sonographic guidance which demonstrated the ablation zone encompassing entirety of hypoechoic mass. 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 PLAN: - The patient will be admitted overnight for continued  observation and PCA usage. - Follow-up telemedicine consultation will be performed in approximately 3-4 weeks. At that time, a CMP level be obtained. Initial postprocedural MRI will be performed in 3 months (mid February 2018). Electronically Signed   By: Simonne Come M.D.   On: 07/21/2022 12:57   IR EMBO TUMOR ORGAN ISCHEMIA INFARCT INC GUIDE ROADMAPPING  Result Date: 07/21/2022 INDICATION: History of hepatocellular carcinoma. Patient presents today for mesenteric arteriogram, bland embolization and percutaneous ultrasound and fluoroscopic guided hepatic lesion microwave ablation. Please refer to formal consultations dictated in the EMR (EPIC) on 06/03/2022 for additional details. EXAM: 1. ULTRASOUND GUIDANCE FOR ARTERIAL ACCESS 2. SELECTIVE CELIAC ARTERIOGRAM 3. SUB SELECTIVE ARTERIOGRAM AND FLUOROSCOPIC GUIDED BLAND EMBOLIZATION OF THE MEDIAL DIVISION OF THE RIGHT HEPATIC ARTERY 4. ULTRASOUND AND FLUOROSCOPIC GUIDED THERMAL ABLATION OF LIVER LESION ANESTHESIA/SEDATION: General MEDICATIONS: Rocephin 1 gm IV. The antibiotic was administered in an appropriate time interval prior to needle puncture of the skin. CONTRAST:  55 cc Omnipaque 300 COMPLICATIONS: None immediate. PROCEDURE: The procedure, risks, benefits, and alternatives were explained to the patient. Questions regarding the procedure were encouraged and answered. The patient understands and consents to the procedure. The patient was placed under general anesthesia. Sonographic evaluation performed of the right upper abdomen demonstrating the at least 3.2 cm hypoechoic mass within the superior aspect of the right lobe of the liver. The procedure was planned. Skin overlying the right groin as well as the right upper abdominal quadrant was prepped draped in sterile fashion. A time-out performed prior initiation of the procedure Initially, under direct ultrasound guidance, the right common femoral artery was accessed with a micropuncture kit allowing for  placement of a 5 French vascular sheath. Closure arteriogram was performed via the side arm of the sheath. Next, over a Bentson wire, a Mickelson catheter was advanced to the level of the thoracic aorta where it was forearm, back blood flushed. Catheter was then utilized to select the celiac artery and a selective celiac arteriogram was performed With the use of a fathom 14 microwire, a TriNav anti-reflux microcatheter was utilized to select the lateral division of the right hepatic artery. Sub selective arteriogram performed. Next, under direct ultrasound guidance, a Neuwave PR percutaneous microwave ablation probe was advanced into the cranial aspect of the hepatic lesion under direct ultrasound guidance. Ultrasound images were saved for procedural documentation purposes. Sub selective injection was then performed of the lateral division of the right hepatic artery microwave ablation probe was slightly repositioned under direct fluoroscopic guidance. At this time, bland embolization was performed of the lateral division right hepatic artery with approximately 1/3 vial of 100-300 micron Embospheres to vessel stasis. At this time, the microcatheter and Mickelson catheter were removed. Next, a 10-minute ablation was performed at 65 watts under intermittent sonographic guidance. While the microwave ablation was being performed, the vascular sheath removed and hemostasis was achieved at the right groin access site with manual compression. Following the 10 minute ablation, tract ablation was performed and microwave probe was removed intact. Hemostasis achieved with manual compression Dressings were applied at both locations the procedure was terminated. Patient tolerated the procedure well without immediate postprocedural complication. FINDINGS: Sonographic evaluation abdomen demonstrates an approximately 3.2 cm hypoechoic mass within the superior aspect of the right lobe of the liver, correlating with the findings seen  preceding abdominal CTA. Celiac arteriogram demonstrates somewhat unconventional hepatic arterial anatomy with separate origins of the medial and lateral divisions of the left hepatic artery. The hypervascular mass within the dome  of the right lobe of the liver is supplied predominantly via lateral division of the right hepatic artery, confirmed with sub selective arteriogram of the lateral division right artery. Under a combination of ultrasound and fluoroscopic guidance, a PR microwave ablation probe was appropriately positioned within hypervascular tumor. Following probe positioning, bland embolization was performed tumor via the microcatheter. Following bland embolization, a 10 minute ablation was performed at 65 watts under intermittent sonographic guidance which demonstrated the ablation zone encompassing entirety of hypoechoic mass. 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 PLAN: - The patient will be admitted overnight for continued observation and PCA usage. - Follow-up telemedicine consultation will be performed in approximately 3-4 weeks. At that time, a CMP level be obtained. Initial postprocedural MRI will be performed in 3 months (mid February 2018). Electronically Signed   By: Simonne Come M.D.   On: 07/21/2022 12:57   IR 3D Independent Annabell Sabal  Result Date: 07/21/2022 INDICATION: History of hepatocellular carcinoma. Patient presents today for mesenteric arteriogram, bland embolization and percutaneous ultrasound and fluoroscopic guided hepatic lesion microwave ablation. Please refer to formal consultations dictated in the EMR (EPIC) on 06/03/2022 for additional details. EXAM: 1. ULTRASOUND GUIDANCE FOR ARTERIAL ACCESS 2. SELECTIVE CELIAC ARTERIOGRAM 3. SUB SELECTIVE ARTERIOGRAM AND FLUOROSCOPIC GUIDED BLAND EMBOLIZATION OF THE MEDIAL DIVISION OF THE RIGHT  HEPATIC ARTERY 4. ULTRASOUND AND FLUOROSCOPIC GUIDED THERMAL ABLATION OF LIVER LESION ANESTHESIA/SEDATION: General MEDICATIONS: Rocephin 1 gm IV. The antibiotic was administered in an appropriate time interval prior to needle puncture of the skin. CONTRAST:  55 cc Omnipaque 300 COMPLICATIONS: None immediate. PROCEDURE: The procedure, risks, benefits, and alternatives were explained to the patient. Questions regarding the procedure were encouraged and answered. The patient understands and consents to the procedure. The patient was placed under general anesthesia. Sonographic evaluation performed of the right upper abdomen demonstrating the at least 3.2 cm hypoechoic mass within the superior aspect of the right lobe of the liver. The procedure was planned. Skin overlying the right groin as well as the right upper abdominal quadrant was prepped draped in sterile fashion. A time-out performed prior initiation of the procedure Initially, under direct ultrasound guidance, the right common femoral artery was accessed with a micropuncture kit allowing for placement of a 5 French vascular sheath. Closure arteriogram was performed via the side arm of the sheath. Next, over a Bentson wire, a Mickelson catheter was advanced to the level of the thoracic aorta where it was forearm, back blood flushed. Catheter was then utilized to select the celiac artery and a selective celiac arteriogram was performed With the use of a fathom 14 microwire, a TriNav anti-reflux microcatheter was utilized to select the lateral division of the right hepatic artery. Sub selective arteriogram performed. Next, under direct ultrasound guidance, a Neuwave PR percutaneous microwave ablation probe was advanced into the cranial aspect of the hepatic lesion under direct ultrasound guidance. Ultrasound images were saved for procedural documentation purposes. Sub selective injection was then performed of the lateral division of the right hepatic artery  microwave ablation probe was slightly repositioned under direct fluoroscopic guidance. At this time, bland embolization was performed of the lateral division right hepatic artery with approximately 1/3 vial of 100-300 micron Embospheres to vessel stasis. At this time, the microcatheter and Mickelson catheter were removed. Next, a 10-minute ablation was performed at 65 watts under intermittent sonographic guidance. While the microwave ablation was being performed, the  vascular sheath removed and hemostasis was achieved at the right groin access site with manual compression. Following the 10 minute ablation, tract ablation was performed and microwave probe was removed intact. Hemostasis achieved with manual compression Dressings were applied at both locations the procedure was terminated. Patient tolerated the procedure well without immediate postprocedural complication. FINDINGS: Sonographic evaluation abdomen demonstrates an approximately 3.2 cm hypoechoic mass within the superior aspect of the right lobe of the liver, correlating with the findings seen preceding abdominal CTA. Celiac arteriogram demonstrates somewhat unconventional hepatic arterial anatomy with separate origins of the medial and lateral divisions of the left hepatic artery. The hypervascular mass within the dome of the right lobe of the liver is supplied predominantly via lateral division of the right hepatic artery, confirmed with sub selective arteriogram of the lateral division right artery. Under a combination of ultrasound and fluoroscopic guidance, a PR microwave ablation probe was appropriately positioned within hypervascular tumor. Following probe positioning, bland embolization was performed tumor via the microcatheter. Following bland embolization, a 10 minute ablation was performed at 65 watts under intermittent sonographic guidance which demonstrated the ablation zone encompassing entirety of hypoechoic mass. 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 PLAN: - The patient will be admitted overnight for continued observation and PCA usage. - Follow-up telemedicine consultation will be performed in approximately 3-4 weeks. At that time, a CMP level be obtained. Initial postprocedural MRI will be performed in 3 months (mid February 2018). Electronically Signed   By: Simonne Come M.D.   On: 07/21/2022 12:57   IR US Guide Vasc Access Right  Result Date: 07/21/2022 INDICATION: History of hepatocellular carcinoma. Patient presents today for mesenteric arteriogram, bland embolization and percutaneous ultrasound and fluoroscopic guided hepatic lesion microwave ablation. Please refer to formal consultations dictated in the EMR (EPIC) on 06/03/2022 for additional details. EXAM: 1. ULTRASOUND GUIDANCE FOR ARTERIAL ACCESS 2. SELECTIVE CELIAC ARTERIOGRAM 3. SUB SELECTIVE ARTERIOGRAM AND FLUOROSCOPIC GUIDED BLAND EMBOLIZATION OF THE MEDIAL DIVISION OF THE RIGHT HEPATIC ARTERY 4. ULTRASOUND AND FLUOROSCOPIC GUIDED THERMAL ABLATION OF LIVER LESION ANESTHESIA/SEDATION: General MEDICATIONS: Rocephin 1 gm IV. The antibiotic was administered in an appropriate time interval prior to needle puncture of the skin. CONTRAST:  55 cc Omnipaque 300 COMPLICATIONS: None immediate. PROCEDURE: The procedure, risks, benefits, and alternatives were explained to the patient. Questions regarding the procedure were encouraged and answered. The patient understands and consents to the procedure. The patient was placed under general anesthesia. Sonographic evaluation performed of the right upper abdomen demonstrating the at least 3.2 cm hypoechoic mass within the superior aspect of the right lobe of the liver. The procedure was planned. Skin overlying the right groin as well as the right upper abdominal quadrant was prepped draped in  sterile fashion. A time-out performed prior initiation of the procedure Initially, under direct ultrasound guidance, the right common femoral artery was accessed with a micropuncture kit allowing for placement of a 5 French vascular sheath. Closure arteriogram was performed via the side arm of the sheath. Next, over a Bentson wire, a Mickelson catheter was advanced to the level of the thoracic aorta where it was forearm, back blood flushed. Catheter was then utilized to select the celiac artery and a selective celiac arteriogram was performed With the use of a fathom 14 microwire, a TriNav anti-reflux microcatheter was utilized to select the lateral division of the right hepatic artery. Sub selective arteriogram performed.  Next, under direct ultrasound guidance, a Neuwave PR percutaneous microwave ablation probe was advanced into the cranial aspect of the hepatic lesion under direct ultrasound guidance. Ultrasound images were saved for procedural documentation purposes. Sub selective injection was then performed of the lateral division of the right hepatic artery microwave ablation probe was slightly repositioned under direct fluoroscopic guidance. At this time, bland embolization was performed of the lateral division right hepatic artery with approximately 1/3 vial of 100-300 micron Embospheres to vessel stasis. At this time, the microcatheter and Mickelson catheter were removed. Next, a 10-minute ablation was performed at 65 watts under intermittent sonographic guidance. While the microwave ablation was being performed, the vascular sheath removed and hemostasis was achieved at the right groin access site with manual compression. Following the 10 minute ablation, tract ablation was performed and microwave probe was removed intact. Hemostasis achieved with manual compression Dressings were applied at both locations the procedure was terminated. Patient tolerated the procedure well without immediate postprocedural  complication. FINDINGS: Sonographic evaluation abdomen demonstrates an approximately 3.2 cm hypoechoic mass within the superior aspect of the right lobe of the liver, correlating with the findings seen preceding abdominal CTA. Celiac arteriogram demonstrates somewhat unconventional hepatic arterial anatomy with separate origins of the medial and lateral divisions of the left hepatic artery. The hypervascular mass within the dome of the right lobe of the liver is supplied predominantly via lateral division of the right hepatic artery, confirmed with sub selective arteriogram of the lateral division right artery. Under a combination of ultrasound and fluoroscopic guidance, a PR microwave ablation probe was appropriately positioned within hypervascular tumor. Following probe positioning, bland embolization was performed tumor via the microcatheter. Following bland embolization, a 10 minute ablation was performed at 65 watts under intermittent sonographic guidance which demonstrated the ablation zone encompassing entirety of hypoechoic mass. 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 PLAN: - The patient will be admitted overnight for continued observation and PCA usage. - Follow-up telemedicine consultation will be performed in approximately 3-4 weeks. At that time, a CMP level be obtained. Initial postprocedural MRI will be performed in 3 months (mid February 2018). Electronically Signed   By: Simonne Come M.D.   On: 07/21/2022 12:57   IR Angiogram Visceral Selective  Result Date: 07/21/2022 INDICATION: History of hepatocellular carcinoma. Patient presents today for mesenteric arteriogram, bland embolization and percutaneous ultrasound and fluoroscopic guided hepatic lesion microwave ablation. Please refer to formal consultations dictated in the EMR (EPIC) on 06/03/2022  for additional details. EXAM: 1. ULTRASOUND GUIDANCE FOR ARTERIAL ACCESS 2. SELECTIVE CELIAC ARTERIOGRAM 3. SUB SELECTIVE ARTERIOGRAM AND FLUOROSCOPIC GUIDED BLAND EMBOLIZATION OF THE MEDIAL DIVISION OF THE RIGHT HEPATIC ARTERY 4. ULTRASOUND AND FLUOROSCOPIC GUIDED THERMAL ABLATION OF LIVER LESION ANESTHESIA/SEDATION: General MEDICATIONS: Rocephin 1 gm IV. The antibiotic was administered in an appropriate time interval prior to needle puncture of the skin. CONTRAST:  55 cc Omnipaque 300 COMPLICATIONS: None immediate. PROCEDURE: The procedure, risks, benefits, and alternatives were explained to the patient. Questions regarding the procedure were encouraged and answered. The patient understands and consents to the procedure. The patient was placed under general anesthesia. Sonographic evaluation performed of the right upper abdomen demonstrating the at least 3.2 cm hypoechoic mass within the superior aspect of the right lobe of the liver. The procedure was planned. Skin overlying the right groin as well as the right upper abdominal quadrant was prepped draped in sterile  fashion. A time-out performed prior initiation of the procedure Initially, under direct ultrasound guidance, the right common femoral artery was accessed with a micropuncture kit allowing for placement of a 5 French vascular sheath. Closure arteriogram was performed via the side arm of the sheath. Next, over a Bentson wire, a Mickelson catheter was advanced to the level of the thoracic aorta where it was forearm, back blood flushed. Catheter was then utilized to select the celiac artery and a selective celiac arteriogram was performed With the use of a fathom 14 microwire, a TriNav anti-reflux microcatheter was utilized to select the lateral division of the right hepatic artery. Sub selective arteriogram performed. Next, under direct ultrasound guidance, a Neuwave PR percutaneous microwave ablation probe was advanced into the cranial aspect of the  hepatic lesion under direct ultrasound guidance. Ultrasound images were saved for procedural documentation purposes. Sub selective injection was then performed of the lateral division of the right hepatic artery microwave ablation probe was slightly repositioned under direct fluoroscopic guidance. At this time, bland embolization was performed of the lateral division right hepatic artery with approximately 1/3 vial of 100-300 micron Embospheres to vessel stasis. At this time, the microcatheter and Mickelson catheter were removed. Next, a 10-minute ablation was performed at 65 watts under intermittent sonographic guidance. While the microwave ablation was being performed, the vascular sheath removed and hemostasis was achieved at the right groin access site with manual compression. Following the 10 minute ablation, tract ablation was performed and microwave probe was removed intact. Hemostasis achieved with manual compression Dressings were applied at both locations the procedure was terminated. Patient tolerated the procedure well without immediate postprocedural complication. FINDINGS: Sonographic evaluation abdomen demonstrates an approximately 3.2 cm hypoechoic mass within the superior aspect of the right lobe of the liver, correlating with the findings seen preceding abdominal CTA. Celiac arteriogram demonstrates somewhat unconventional hepatic arterial anatomy with separate origins of the medial and lateral divisions of the left hepatic artery. The hypervascular mass within the dome of the right lobe of the liver is supplied predominantly via lateral division of the right hepatic artery, confirmed with sub selective arteriogram of the lateral division right artery. Under a combination of ultrasound and fluoroscopic guidance, a PR microwave ablation probe was appropriately positioned within hypervascular tumor. Following probe positioning, bland embolization was performed tumor via the microcatheter. Following  bland embolization, a 10 minute ablation was performed at 65 watts under intermittent sonographic guidance which demonstrated the ablation zone encompassing entirety of hypoechoic mass. 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 PLAN: - The patient will be admitted overnight for continued observation and PCA usage. - Follow-up telemedicine consultation will be performed in approximately 3-4 weeks. At that time, a CMP level be obtained. Initial postprocedural MRI will be performed in 3 months (mid February 2018). Electronically Signed   By: Simonne Come M.D.   On: 07/21/2022 12:57   IR Angiogram Selective Each Additional Vessel  Result Date: 07/21/2022 INDICATION: History of hepatocellular carcinoma. Patient presents today for mesenteric arteriogram, bland embolization and percutaneous ultrasound and fluoroscopic guided hepatic lesion microwave ablation. Please refer to formal consultations dictated in the EMR (EPIC) on 06/03/2022 for additional details. EXAM: 1. ULTRASOUND GUIDANCE FOR ARTERIAL ACCESS 2. SELECTIVE CELIAC ARTERIOGRAM 3. SUB SELECTIVE ARTERIOGRAM AND FLUOROSCOPIC GUIDED BLAND EMBOLIZATION OF THE MEDIAL DIVISION OF THE RIGHT HEPATIC ARTERY 4. ULTRASOUND AND FLUOROSCOPIC GUIDED THERMAL ABLATION OF LIVER LESION ANESTHESIA/SEDATION:  General MEDICATIONS: Rocephin 1 gm IV. The antibiotic was administered in an appropriate time interval prior to needle puncture of the skin. CONTRAST:  55 cc Omnipaque 300 COMPLICATIONS: None immediate. PROCEDURE: The procedure, risks, benefits, and alternatives were explained to the patient. Questions regarding the procedure were encouraged and answered. The patient understands and consents to the procedure. The patient was placed under general anesthesia. Sonographic evaluation performed of the right upper abdomen demonstrating  the at least 3.2 cm hypoechoic mass within the superior aspect of the right lobe of the liver. The procedure was planned. Skin overlying the right groin as well as the right upper abdominal quadrant was prepped draped in sterile fashion. A time-out performed prior initiation of the procedure Initially, under direct ultrasound guidance, the right common femoral artery was accessed with a micropuncture kit allowing for placement of a 5 French vascular sheath. Closure arteriogram was performed via the side arm of the sheath. Next, over a Bentson wire, a Mickelson catheter was advanced to the level of the thoracic aorta where it was forearm, back blood flushed. Catheter was then utilized to select the celiac artery and a selective celiac arteriogram was performed With the use of a fathom 14 microwire, a TriNav anti-reflux microcatheter was utilized to select the lateral division of the right hepatic artery. Sub selective arteriogram performed. Next, under direct ultrasound guidance, a Neuwave PR percutaneous microwave ablation probe was advanced into the cranial aspect of the hepatic lesion under direct ultrasound guidance. Ultrasound images were saved for procedural documentation purposes. Sub selective injection was then performed of the lateral division of the right hepatic artery microwave ablation probe was slightly repositioned under direct fluoroscopic guidance. At this time, bland embolization was performed of the lateral division right hepatic artery with approximately 1/3 vial of 100-300 micron Embospheres to vessel stasis. At this time, the microcatheter and Mickelson catheter were removed. Next, a 10-minute ablation was performed at 65 watts under intermittent sonographic guidance. While the microwave ablation was being performed, the vascular sheath removed and hemostasis was achieved at the right groin access site with manual compression. Following the 10 minute ablation, tract ablation was performed and  microwave probe was removed intact. Hemostasis achieved with manual compression Dressings were applied at both locations the procedure was terminated. Patient tolerated the procedure well without immediate postprocedural complication. FINDINGS: Sonographic evaluation abdomen demonstrates an approximately 3.2 cm hypoechoic mass within the superior aspect of the right lobe of the liver, correlating with the findings seen preceding abdominal CTA. Celiac arteriogram demonstrates somewhat unconventional hepatic arterial anatomy with separate origins of the medial and lateral divisions of the left hepatic artery. The hypervascular mass within the dome of the right lobe of the liver is supplied predominantly via lateral division of the right hepatic artery, confirmed with sub selective arteriogram of the lateral division right artery. Under a combination of ultrasound and fluoroscopic guidance, a PR microwave ablation probe was appropriately positioned within hypervascular tumor. Following probe positioning, bland embolization was performed tumor via the microcatheter. Following bland embolization, a 10 minute ablation was performed at 65 watts under intermittent sonographic guidance which demonstrated the ablation zone encompassing entirety of hypoechoic mass. 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 PLAN: - The patient will be admitted overnight for continued observation and PCA usage. - Follow-up telemedicine consultation will be performed in approximately 3-4 weeks. At that time, a CMP level be obtained. Initial  postprocedural MRI will be performed in 3 months (mid February 2018). Electronically Signed   By: Simonne Come M.D.   On: 07/21/2022 12:57    Labs:  CBC: Recent Labs    06/30/22 0925 07/21/22 0700 07/21/22 2138 07/22/22 0857 07/22/22 1409 07/22/22 2100  07/23/22 0428 07/23/22 1020  WBC 3.9* 4.0 13.7* 13.8*  --   --   --   --   HGB 12.3 12.4 9.7* 8.3* 8.1* 8.2* 7.7* 7.3*  HCT 39.0 39.2 31.7* 25.5* 25.7* 25.7* 24.4* 22.8*  PLT 95* 94* 174 131*  --   --   --   --     COAGS: Recent Labs    04/13/22 0948 06/30/22 0925  INR 1.1 1.0    BMP: Recent Labs    04/30/22 1231 06/30/22 0925 07/21/22 0700 07/23/22 0428  NA 140 137 135 135  K 4.4 4.6 4.3 4.9  CL 109 107 107 107  CO2 27 22 20* 22  GLUCOSE 90 122* 133* 133*  BUN 12 26* 32* 52*  CALCIUM 10.0 9.7 9.6 9.1  CREATININE 0.72 0.64 0.68 1.01*  GFRNONAA >60 >60 >60 59*    LIVER FUNCTION TESTS: Recent Labs    04/30/22 1231 06/30/22 0925 07/21/22 0700 07/23/22 0428  BILITOT 0.9 0.7 0.5 0.3  AST 56* 55* 49* 132*  ALT 46* 69* 53* 141*  ALKPHOS 109 123 113 80  PROT 7.6 7.3 7.5 6.0*  ALBUMIN 3.8 3.7 3.6 3.1*    Assessment and Plan: 72 yo female with past medical history of anxiety, arthritis, asthma, coronary artery disease, CHF, COPD, depression, diabetes, hypertension, hypothyroidism, mild aortic stenosis, sarcoidosis, and NASH cirrhosis. She was found to have a worrisome enhancing liver lesion on chest CT done  04/13/2022, performed for the evaluation of hemoptysis.  Subsequent abdominal MRI performed 04/22/2022 confirmed the presence of an approximately 3.1 x 3.1 cm enhancing lesion within segment 8 of the right lobe of the liver, characterized as LI-RADS 5, definitive HCC.The patient was subsequently  evaluated by Dr. Freida Busman of the hepatobiliary surgical service and deemed a nonoperative candidate given her medical comorbidities and degree of cirrhosis.The patient was then evaluated by Annamarie Major from the hepatobiliary transplant team and the patient was referred for consideration of percutaneous management.  Latest AFP on 04/30/2022 was 4.1.  Following discussions with Dr. Grace Isaac patient was deemed an appropriate candidate for transcatheter bland embolization followed by  percutaneous microwave ablation of the right hepatocellular carcinoma via general anesthesia; s/p mesenteric arteriogram and bland embolization of right HCC/percutaneous [place] and fluoro guided ablation of  right HCC 7/3 ; pt developed post procedure mod RP hematoma with drop in hgb , latest 7.3; undergoing 2 unit blood transfusion today; creat 1.01- getting IVF bolus; will give glycerin supp for constipation; BP ok , not tachycardic, afebrile; check f/u CBC post transfusion; SCD's; if f/u CBC improving will allow pt to ambulate with supervision; hopeful can dc home tomorrow if stable   Electronically Signed: D. Jeananne Rama, PA-C 07/23/2022, 2:40 PM   I spent a total of 15 Minutes at the the patient's bedside AND on the patient's hospital floor or unit, greater than 50% of which was counseling/coordinating care for mesenteric /hepatic arteriogram with thermal ablation/bland embolization of hepatocellular carcinoma     Patient ID: [name], female   DOB: August 30, 1950, 72 y.o.   MRN: [id number]

## 2022-07-23 NOTE — Progress Notes (Addendum)
MD Archer Asa was notified through [facility] about hgb 7.7, verbal order for type and screen, consent, and to transfuse 2 units of PRBCs.

## 2022-07-24 LAB — HEMOGLOBIN AND HEMATOCRIT, BLOOD
HCT: 29.8 % — ABNORMAL LOW (ref 36.0–46.0)
HCT: 31.4 % — ABNORMAL LOW (ref 36.0–46.0)
Hemoglobin: 9.7 g/dL — ABNORMAL LOW (ref 12.0–15.0)
Hemoglobin: 10.3 g/dL — ABNORMAL LOW (ref 12.0–15.0)

## 2022-07-24 LAB — TYPE AND SCREEN
ABO/RH(D): O NEG
Unit division: 0
Unit division: 0

## 2022-07-24 LAB — BPAM RBC
Blood Product Expiration Date: 202407282359
ISSUE DATE / TIME: 202407051036
ISSUE DATE / TIME: 202407051434
Unit Type and Rh: 9500

## 2022-07-24 NOTE — Discharge Instructions (Signed)
May resume home medications, may resume aspirin on 7/8, stay well-hydrated, avoid strenuous activity/heavy lifting for 1 week.

## 2022-07-24 NOTE — Plan of Care (Signed)

## 2022-07-24 NOTE — Discharge Summary (Signed)
Patient ID: PHEDRA ZELENKA MRN: [id number] DOB/AGE: 03-04-1950 72 y.o.  Admit date: 07/21/2022 Discharge date: 07/24/2022  Supervising Physician: Malachy Moan  Patient Status: [facility] - In-pt  Admission Diagnoses: Right hepatocellular carcinoma  Discharge Diagnoses: Right hepatocellular carcinoma, status post successful bland arterial embolization and ultrasound/fluoroscopic guided microwave ablation of solitary focus of hepatocellular carcinoma within the right lobe of liver on 07/21/2022 with subsequent moderate right retroperitoneal hematoma, treated with blood transfusion and fluid bolus with stabilization of hemoglobin prior to discharge Principal Problem:   HCC (hepatocellular carcinoma) (HCC) Active Problems:   Cancer, hepatocellular (HCC)  Past Medical History:  Diagnosis Date   Anxiety    Arthritis    Asthma    Blood dyscrasia    Thrombocytopenia   CAD (coronary artery disease)    CHF (congestive heart failure) (HCC)    Complication of anesthesia    trouble waking up once with Nissen Fundoplication   COPD (chronic obstructive pulmonary disease) (HCC)    Depression    Diabetes mellitus without complication (HCC)    Dysrhythmia    Heart murmur    hepatocellular ca    Hypertension    Hypothyroidism    Liver disease    NAFLD cirrhosis   Mild aortic stenosis    Pneumonia    Sarcoidosis    Sarcoidosis of lung (HCC)    Sarcoidosis of lymph nodes    Past Surgical History:  Procedure Laterality Date   ABDOMINAL HYSTERECTOMY     ABDOMINAL SURGERY     CHOLECYSTECTOMY     ESOPHAGOGASTRODUODENOSCOPY (EGD) WITH PROPOFOL N/A 04/17/2021   Procedure: ESOPHAGOGASTRODUODENOSCOPY (EGD) WITH PROPOFOL;  Surgeon: Jeani Hawking, MD;  Location: [facility];  Service: Gastroenterology;  Laterality: N/A;   HERNIA REPAIR     IR 3D INDEPENDENT WKST  07/21/2022   IR ANGIOGRAM SELECTIVE EACH ADDITIONAL VESSEL  07/21/2022   IR ANGIOGRAM VISCERAL SELECTIVE  07/21/2022   IR EMBO TUMOR ORGAN  ISCHEMIA INFARCT INC GUIDE ROADMAPPING  07/21/2022   IR GUIDED LIVER TUMOR ABLATION RFA PERC  07/21/2022   IR US GUIDE VASC ACCESS RIGHT  07/21/2022   LASIK  2001   LEFT HEART CATH AND CORONARY ANGIOGRAPHY N/A 09/23/2020   Procedure: LEFT HEART CATH AND CORONARY ANGIOGRAPHY;  Surgeon: Lennette Bihari, MD;  Location: [facility];  Service: Cardiovascular;  Laterality: N/A;   nissenfundiplication     RADIOLOGY WITH ANESTHESIA N/A 07/21/2022   Procedure: IR WITH ANESTHESIA MICROWAVE ABLATION;  Surgeon: Simonne Come, MD;  Location: [facility];  Service: Anesthesiology;  Laterality: N/A;   TOTAL KNEE ARTHROPLASTY Right 05/19/2021   Procedure: TOTAL KNEE ARTHROPLASTY;  Surgeon: Durene Romans, MD;  Location: [facility];  Service: Orthopedics;  Laterality: Right;     Discharged Condition: good  Hospital Course: Mrs. Tribe is a 72 year old female with past medical history of anxiety, arthritis, asthma, coronary artery disease, CHF, COPD, depression, diabetes, hypertension, hypothyroidism, mild aortic stenosis, sarcoidosis, and NASH cirrhosis. She was found to have a worrisome enhancing liver lesion on chest CT done  04/13/2022, performed for the evaluation of hemoptysis.  Subsequent abdominal MRI performed 04/22/2022 confirmed the presence of an approximately 3.1 x 3.1 cm enhancing lesion within segment 8 of the right lobe of the liver, characterized as LI-RADS 5, definitive HCC.The patient was subsequently  evaluated by Dr. Freida Busman of the hepatobiliary surgical service and deemed a nonoperative candidate given her medical comorbidities and degree of cirrhosis.The patient was then evaluated by Annamarie Major from the hepatobiliary transplant  team and the patient was referred for consideration of percutaneous management.  Latest AFP on 04/30/2022 was 4.1.  Following discussions with Dr. Grace Isaac patient was deemed an appropriate candidate for transcatheter bland embolization followed by percutaneous microwave ablation of the right  hepatocellular carcinoma via general anesthesia.  On 7/3 she underwent successful bland arterial embolization and ultrasound/fluoroscopic guided microwave ablation of a solitary focus of hepatocellular carcinoma within the right lobe of the liver by Drs. Watts and Joffre and via general anesthesia.  The procedure was performed without immediate complications and she was admitted to the hospital for observation.  During the late evening of her first hospital night patient began to complain of lightheadedness upon ambulation, nausea and right lower quadrant discomfort.  Patient was given antiemetics, fluid resuscitation and follow-up CBC obtained which revealed hemoglobin 9.7 down from 12.4.  Due to persistent symptoms patient underwent CT abdomen pelvis on 7/4 which revealed moderate right retroperitoneal hematoma.  Patient was placed on strict bedrest and serial H&H's were obtained.  Hemoglobin continued to gradually drop to low of 7.3.  Patient was subsequently transfused 2 units packed red blood cells and fluid bolus given.  Follow-up hemoglobin post transfusion was 9.8.  On the day of discharge patient was doing much better.  Hemoglobin 9.7.  She was ambulating in the hallway, voiding, able to tolerate her diet, had bowel movement.  She denied lightheadedness, fever, headache, chest pain, dyspnea, cough, back pain, nausea, vomiting or bleeding.  She only reported some minimal right lower quadrant discomfort.  Above findings were discussed with Dr. Archer Asa and patient was deemed stable for discharge at this time.  She will resume her home medications with exception of baby aspirin which she will resume on 7/8.  Patient was instructed to avoid heavy lifting/strenuous activity for 1 week.  IR team will follow-up with patient in 3 to 4 weeks.  Patient was told to contact our service with any additional questions or concerns.  She will also continue follow-up with Annamarie Major in hepatology   clinic.   Consults: anesthesia  Significant Diagnostic Studies:  Results for orders placed or performed during the hospital encounter of 07/21/22  CBC with Differential/Platelet  Result Value Ref Range   WBC 4.0 4.0 - 10.5 K/uL   RBC 4.53 3.87 - 5.11 MIL/uL   Hemoglobin 12.4 12.0 - 15.0 g/dL   HCT 96.0 45.4 - 09.8 %   MCV 86.5 80.0 - 100.0 fL   MCH 27.4 26.0 - 34.0 pg   MCHC 31.6 30.0 - 36.0 g/dL   RDW 11.9 14.7 - 82.9 %   Platelets 94 (L) 150 - 400 K/uL   nRBC 0.0 0.0 - 0.2 %   Neutrophils Relative % 57 %   Neutro Abs 2.3 1.7 - 7.7 K/uL   Lymphocytes Relative 22 %   Lymphs Abs 0.9 0.7 - 4.0 K/uL   Monocytes Relative 9 %   Monocytes Absolute 0.4 0.1 - 1.0 K/uL   Eosinophils Relative 10 %   Eosinophils Absolute 0.4 0.0 - 0.5 K/uL   Basophils Relative 1 %   Basophils Absolute 0.1 0.0 - 0.1 K/uL   Immature Granulocytes 1 %   Abs Immature Granulocytes 0.02 0.00 - 0.07 K/uL  Comprehensive metabolic panel  Result Value Ref Range   Sodium 135 135 - 145 mmol/L   Potassium 4.3 3.5 - 5.1 mmol/L   Chloride 107 98 - 111 mmol/L   CO2 20 (L) 22 - 32 mmol/L   Glucose, Bld 133 (  H) 70 - 99 mg/dL   BUN 32 (H) 8 - 23 mg/dL   Creatinine, Ser 1.61 0.44 - 1.00 mg/dL   Calcium 9.6 8.9 - 09.6 mg/dL   Total Protein 7.5 6.5 - 8.1 g/dL   Albumin 3.6 3.5 - 5.0 g/dL   AST 49 (H) 15 - 41 U/L   ALT 53 (H) 0 - 44 U/L   Alkaline Phosphatase 113 38 - 126 U/L   Total Bilirubin 0.5 0.3 - 1.2 mg/dL   GFR, Estimated >04 >54 mL/min   Anion gap 8 5 - 15  Glucose, capillary  Result Value Ref Range   Glucose-Capillary 114 (H) 70 - 99 mg/dL  Glucose, capillary  Result Value Ref Range   Glucose-Capillary 234 (H) 70 - 99 mg/dL  CBC  Result Value Ref Range   WBC 13.7 (H) 4.0 - 10.5 K/uL   RBC 3.53 (L) 3.87 - 5.11 MIL/uL   Hemoglobin 9.7 (L) 12.0 - 15.0 g/dL   HCT 09.8 (L) 11.9 - 14.7 %   MCV 89.8 80.0 - 100.0 fL   MCH 27.5 26.0 - 34.0 pg   MCHC 30.6 30.0 - 36.0 g/dL   RDW 82.9 56.2 - 13.0 %    Platelets 174 150 - 400 K/uL   nRBC 0.0 0.0 - 0.2 %  CBC with Differential/Platelet  Result Value Ref Range   WBC 13.8 (H) 4.0 - 10.5 K/uL   RBC 2.92 (L) 3.87 - 5.11 MIL/uL   Hemoglobin 8.3 (L) 12.0 - 15.0 g/dL   HCT 86.5 (L) 78.4 - 69.6 %   MCV 87.3 80.0 - 100.0 fL   MCH 28.4 26.0 - 34.0 pg   MCHC 32.5 30.0 - 36.0 g/dL   RDW 29.5 28.4 - 13.2 %   Platelets 131 (L) 150 - 400 K/uL   nRBC 0.0 0.0 - 0.2 %   Neutrophils Relative % 85 %   Neutro Abs 11.7 (H) 1.7 - 7.7 K/uL   Lymphocytes Relative 7 %   Lymphs Abs 1.0 0.7 - 4.0 K/uL   Monocytes Relative 8 %   Monocytes Absolute 1.1 (H) 0.1 - 1.0 K/uL   Eosinophils Relative 0 %   Eosinophils Absolute 0.0 0.0 - 0.5 K/uL   Basophils Relative 0 %   Basophils Absolute 0.0 0.0 - 0.1 K/uL   Immature Granulocytes 0 %   Abs Immature Granulocytes 0.06 0.00 - 0.07 K/uL  Hemoglobin and hematocrit, blood  Result Value Ref Range   Hemoglobin 8.1 (L) 12.0 - 15.0 g/dL   HCT 44.0 (L) 10.2 - 72.5 %  Hemoglobin and hematocrit, blood  Result Value Ref Range   Hemoglobin 8.2 (L) 12.0 - 15.0 g/dL   HCT 36.6 (L) 44.0 - 34.7 %  Hemoglobin and hematocrit, blood  Result Value Ref Range   Hemoglobin 7.7 (L) 12.0 - 15.0 g/dL   HCT 42.5 (L) 95.6 - 38.7 %  Comprehensive metabolic panel  Result Value Ref Range   Sodium 135 135 - 145 mmol/L   Potassium 4.9 3.5 - 5.1 mmol/L   Chloride 107 98 - 111 mmol/L   CO2 22 22 - 32 mmol/L   Glucose, Bld 133 (H) 70 - 99 mg/dL   BUN 52 (H) 8 - 23 mg/dL   Creatinine, Ser 5.64 (H) 0.44 - 1.00 mg/dL   Calcium 9.1 8.9 - 33.2 mg/dL   Total Protein 6.0 (L) 6.5 - 8.1 g/dL   Albumin 3.1 (L) 3.5 - 5.0 g/dL   AST 951 (H) 15 -  41 U/L   ALT 141 (H) 0 - 44 U/L   Alkaline Phosphatase 80 38 - 126 U/L   Total Bilirubin 0.3 0.3 - 1.2 mg/dL   GFR, Estimated 59 (L) >60 mL/min   Anion gap 6 5 - 15  Hemoglobin and hematocrit, blood  Result Value Ref Range   Hemoglobin 7.3 (L) 12.0 - 15.0 g/dL   HCT 91.4 (L) 78.2 - 95.6 %   Hemoglobin and hematocrit, blood  Result Value Ref Range   Hemoglobin 9.7 (L) 12.0 - 15.0 g/dL   HCT 21.3 (L) 08.6 - 57.8 %  CBC  Result Value Ref Range   WBC 8.2 4.0 - 10.5 K/uL   RBC 3.44 (L) 3.87 - 5.11 MIL/uL   Hemoglobin 9.8 (L) 12.0 - 15.0 g/dL   HCT 46.9 (L) 62.9 - 52.8 %   MCV 87.2 80.0 - 100.0 fL   MCH 28.5 26.0 - 34.0 pg   MCHC 32.7 30.0 - 36.0 g/dL   RDW 41.3 24.4 - 01.0 %   Platelets 80 (L) 150 - 400 K/uL   nRBC 0.0 0.0 - 0.2 %  Type and screen [facility]  Result Value Ref Range   ABO/RH(D) O NEG    Antibody Screen NEG    Sample Expiration 07/26/2022,2359    Unit Number [id number]    Blood Component Type RED CELLS,LR    Unit division 00    Status of Unit ISSUED,FINAL    Transfusion Status OK TO TRANSFUSE    Crossmatch Result Compatible    Unit Number [id number]    Blood Component Type RED CELLS,LR    Unit division 00    Status of Unit ISSUED,FINAL    Transfusion Status OK TO TRANSFUSE    Crossmatch Result      Compatible Performed at [facility], [address] [place], [place] [address]   Sample to Blood Bank (Hold Clot)  Result Value Ref Range   Blood Bank Specimen SAMPLE AVAILABLE FOR TESTING    Sample Expiration      07/26/2022,2359 Performed at [facility], [address] [place], [place] [address]   Prepare RBC (crossmatch)  Result Value Ref Range   Order Confirmation      ORDER PROCESSED BY BLOOD BANK Performed at [facility], [address] [place], [place] [address]   BPAM [facility]  Result Value Ref Range   ISSUE DATE / TIME 160109323557    Blood Product Unit Number [id number]    PRODUCT CODE E0382V00    Unit Type and Rh 9500    Blood Product Expiration Date 762831517616    ISSUE DATE / TIME 073710626948    Blood Product Unit Number [id number]    PRODUCT CODE [id number]    Unit Type and Rh 9500    Blood Product Expiration Date 696789381017       Treatments: Successful bland arterial embolization and ultrasound/fluoroscopic guided microwave ablation of solitary focus of hepatocellular carcinoma within the right lobe of liver on 07/21/2022 with subsequent moderate right retroperitoneal hematoma, treated with blood transfusion and fluid bolus with stabilization of hemoglobin prior to discharge  Discharge Exam: Blood pressure (!) 143/49, pulse 78, temperature 98.2 F (36.8 C), temperature source Oral, resp. rate 16, height 5\' 2"  (1.575 m), weight 175 lb 9.6 oz (79.7 kg), SpO2 95 %.  Patient awake, alert.  Chest clear to auscultation bilaterally.  Heart with regular rate and rhythm, positive murmur.  Abdomen soft, positive bowel sounds,  mildly tender right lower quadrant to palpation.  Puncture site right upper quadrant clean, dry, nontender;  right common femoral artery access site soft, clean, dry, no ecchymosis, not significantly tender.  Intact distal pulses, trace pretibial edema bilaterally.  Sensory/motor function intact.   Disposition: Discharge disposition: 01-Home or Self Care       Discharge Instructions     Call MD for:  difficulty breathing, headache or visual disturbances   Complete by: As directed    Call MD for:  extreme fatigue   Complete by: As directed    Call MD for:  hives   Complete by: As directed    Call MD for:  persistant dizziness or light-headedness   Complete by: As directed    Call MD for:  persistant nausea and vomiting   Complete by: As directed    Call MD for:  redness, tenderness, or signs of infection (pain, swelling, redness, odor or green/yellow discharge around incision site)   Complete by: As directed    Call MD for:  severe uncontrolled pain   Complete by: As directed    Call MD for:  temperature >100.4   Complete by: As directed    Change dressing (specify)   Complete by: As directed    May apply Band-Aids to puncture sites right upper abdomen and right groin region daily for the  next 2 to 3 days.  May wash sites with soap and water.   Diet - low sodium heart healthy   Complete by: As directed    Discharge instructions   Complete by: As directed    May resume home medications; may resume aspirin on 7/8; stay well-hydrated, avoid strenuous activity / heavy lifting for 1 week   Driving Restrictions   Complete by: As directed    No driving for the next 24 to 48 hours   Increase activity slowly   Complete by: As directed    Lifting restrictions   Complete by: As directed    No strenuous activity/heavy lifting for 1 week   May shower / Bathe   Complete by: As directed    May walk up steps   Complete by: As directed       Allergies as of 07/24/2022       Reactions   Sulfa Antibiotics Other (See Comments)   High fever Body ache   Atorvastatin Other (See Comments)   Extreme muscle cramps and muscle aches   Astelin [azelastine]    Nasal spray caused a burning sensation    Amoxicillin Rash   Tolerated Ancef in 2023, received Rocephin inpatient & prescribed Omnicef outpatient prior to Amox allergy being documented.        Medication List     TAKE these medications    acetaminophen 325 MG tablet Commonly known as: TYLENOL Take 650 mg by mouth every 6 (six) hours as needed for moderate pain.   acyclovir 400 MG tablet Commonly known as: ZOVIRAX Take 400 mg by mouth every 8 (eight) hours as needed. Fever blisters   albuterol 108 (90 Base) MCG/ACT inhaler Commonly known as: VENTOLIN HFA Inhale 2 puffs into the lungs every 6 (six) hours as needed.   ALLERGY RELIEF EYE DROPS OP Place 1 drop into both eyes daily as needed (allergies).   ALPRAZolam 0.25 MG tablet Commonly known as: XANAX Take 0.25 mg by mouth 3 (three) times daily as needed.   amLODipine 10 MG tablet Commonly known as: NORVASC Take 1 tablet (10 mg total) by  mouth daily.   aspirin EC 81 MG tablet Take 81 mg by mouth daily. Swallow whole.   Biotin 10 MG Caps Take 10 mg by mouth  daily.   CALCIUM 600 + D PO Take 1 tablet by mouth daily.   carvedilol 25 MG tablet Commonly known as: COREG TAKE 1 TABLET BY MOUTH TWICE DAILY WITH A MEAL   docusate sodium 100 MG capsule Commonly known as: COLACE Take 1 capsule (100 mg total) by mouth 2 (two) times daily.   fluticasone 50 MCG/ACT nasal spray Commonly known as: FLONASE Place 1 spray into both nostrils daily.   furosemide 20 MG tablet Commonly known as: LASIX Take 20-40 mg by mouth See admin instructions. Alternate taking 20 mg one day and 40 mg the next   gabapentin 300 MG capsule Commonly known as: NEURONTIN Take 300 mg by mouth 2 (two) times daily.   irbesartan 150 MG tablet Commonly known as: AVAPRO Take 1 tablet by mouth once daily   levothyroxine 150 MCG tablet Commonly known as: SYNTHROID Take 75-150 mcg by mouth See admin instructions. Pt alternates taking 150 mcg one day and 75 mcg the next with before breakfast   metFORMIN 500 MG 24 hr tablet Commonly known as: GLUCOPHAGE-XR Take 2 tablets (1,000 mg total) by mouth 2 (two) times daily. What changed:  how much to take when to take this   rosuvastatin 10 MG tablet Commonly known as: CRESTOR Take 10 mg by mouth daily.   spironolactone 25 MG tablet Commonly known as: ALDACTONE Take 25 mg by mouth daily.   tiZANidine 4 MG tablet Commonly known as: ZANAFLEX Take 4 mg by mouth every 8 (eight) hours as needed for muscle spasms.   traMADol 50 MG tablet Commonly known as: ULTRAM Take 50 mg by mouth daily as needed for moderate pain.   zolpidem 10 MG tablet Commonly known as: AMBIEN Take 10 mg by mouth at bedtime.               Discharge Care Instructions  (From admission, onward)           Start     Ordered   07/24/22 0000  Change dressing (specify)       Comments: May apply Band-Aids to puncture sites right upper abdomen and right groin region daily for the next 2 to 3 days.  May wash sites with soap and water.   07/24/22  0900            Follow-up Information     Simonne Come, MD Follow up.   Specialties: Interventional Radiology, Radiology Why: Radiology team will contact you regarding follow-up with Dr. Grace Isaac in 3 to 4 weeks. call [phone] or [phone] with any questions Contact information: [address] [place] [address] [phone]         Annamarie Major, CRNP Follow up.   Specialty: Nurse Practitioner Why: Follow-up with Ms. Elsie Stain  as scheduled Contact information: [address] [place] [address] [phone]                  Electronically Signed: D. Jeananne Rama, PA-C 07/24/2022, 9:05 AM   I have spent Less Than 30 Minutes discharging [name].

## 2022-07-24 NOTE — Progress Notes (Signed)
Reviewed written d/c instructions w pt and her husband and all questions answered. She verbalized understanding. D/C via w/c w all belongings in stable condition.

## 2022-07-26 ENCOUNTER — Emergency Department (HOSPITAL_COMMUNITY): Payer: Medicare Other

## 2022-07-26 ENCOUNTER — Other Ambulatory Visit: Payer: Self-pay

## 2022-07-26 ENCOUNTER — Encounter (HOSPITAL_COMMUNITY): Payer: Self-pay

## 2022-07-26 ENCOUNTER — Emergency Department (HOSPITAL_COMMUNITY)
Admission: EM | Admit: 2022-07-26 | Discharge: 2022-07-27 | Disposition: A | Discharge status: Home / Self Care | Payer: Medicare Other | Attending: Emergency Medicine | Admitting: Emergency Medicine

## 2022-07-26 ENCOUNTER — Ambulatory Visit: Payer: Self-pay

## 2022-07-26 DIAGNOSIS — R0789 Other chest pain: Secondary | ICD-10-CM | POA: Diagnosis not present

## 2022-07-26 DIAGNOSIS — I2510 Atherosclerotic heart disease of native coronary artery without angina pectoris: Secondary | ICD-10-CM | POA: Diagnosis not present

## 2022-07-26 DIAGNOSIS — Z7982 Long term (current) use of aspirin: Secondary | ICD-10-CM | POA: Insufficient documentation

## 2022-07-26 DIAGNOSIS — R0602 Shortness of breath: Secondary | ICD-10-CM | POA: Diagnosis not present

## 2022-07-26 DIAGNOSIS — R509 Fever, unspecified: Secondary | ICD-10-CM | POA: Insufficient documentation

## 2022-07-26 LAB — COMPREHENSIVE METABOLIC PANEL
ALT: 63 U/L — ABNORMAL HIGH (ref 0–44)
AST: 46 U/L — ABNORMAL HIGH (ref 15–41)
Albumin: 3.5 g/dL (ref 3.5–5.0)
Alkaline Phosphatase: 97 U/L (ref 38–126)
Anion gap: 7 (ref 5–15)
BUN: 19 mg/dL (ref 8–23)
CO2: 20 mmol/L — ABNORMAL LOW (ref 22–32)
Calcium: 8.9 mg/dL (ref 8.9–10.3)
Chloride: 109 mmol/L (ref 98–111)
Creatinine, Ser: 0.89 mg/dL (ref 0.44–1.00)
GFR, Estimated: >60 mL/min (ref >60)
Glucose, Bld: 134 mg/dL — ABNORMAL HIGH (ref 70–99)
Potassium: 4.4 mmol/L (ref 3.5–5.1)
Sodium: 136 mmol/L (ref 135–145)
Total Bilirubin: 1.9 mg/dL — ABNORMAL HIGH (ref 0.3–1.2)
Total Protein: 7.3 g/dL (ref 6.5–8.1)

## 2022-07-26 LAB — TROPONIN I (HIGH SENSITIVITY): Troponin I (High Sensitivity): 26 ng/L — ABNORMAL HIGH (ref <18)

## 2022-07-26 LAB — CBC
HCT: 33.8 % — ABNORMAL LOW (ref 36.0–46.0)
Hemoglobin: 11.1 g/dL — ABNORMAL LOW (ref 12.0–15.0)
MCH: 29.1 pg (ref 26.0–34.0)
MCHC: 32.8 g/dL (ref 30.0–36.0)
MCV: 88.7 fL (ref 80.0–100.0)
Platelets: 147 10*3/uL — ABNORMAL LOW (ref 150–400)
RBC: 3.81 MIL/uL — ABNORMAL LOW (ref 3.87–5.11)
RDW: 15.7 % — ABNORMAL HIGH (ref 11.5–15.5)
WBC: 14.3 10*3/uL — ABNORMAL HIGH (ref 4.0–10.5)
nRBC: 0 % (ref 0.0–0.2)

## 2022-07-26 NOTE — ED Provider Notes (Signed)
[place] [facility]ERGENCY DEPARTMENT AT [facility] Provider Note   CSN: [id number] Arrival date & time: 07/26/22  1907     History {Add pertinent medical, surgical, social history, OB history to HPI:1} Chief Complaint  Patient presents with   Chest Pain    [name] is a 72 y.o. female.  Patient presents with complaints of chest discomfort.  She reports that she was lying down earlier when she felt a heaviness in her chest and some difficulty with breathing.  Symptoms have resolved.  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  She reports that she had some postoperative complications of bleeding which required blood transfusion.       Home Medications Prior to Admission medications   Medication Sig Start Date End Date Taking? Authorizing Provider  acetaminophen (TYLENOL) 325 MG tablet Take 650 mg by mouth every 6 (six) hours as needed for moderate pain.    [provider]  acyclovir (ZOVIRAX) 400 MG tablet Take 400 mg by mouth every 8 (eight) hours as needed. Fever blisters 05/19/20   [provider]  albuterol (VENTOLIN HFA) 108 (90 Base) MCG/ACT inhaler Inhale 2 puffs into the lungs every 6 (six) hours as needed. 04/08/22   Charlott Holler, MD  ALPRAZolam Prudy Feeler) 0.25 MG tablet Take 0.25 mg by mouth 3 (three) times daily as needed. 04/27/22   [provider]  amLODipine (NORVASC) 10 MG tablet Take 1 tablet (10 mg total) by mouth daily. 11/04/21 10/30/22  Jodelle Red, MD  aspirin EC 81 MG tablet Take 81 mg by mouth daily. Swallow whole.    [provider]  Biotin 10 MG CAPS Take 10 mg by mouth daily.    [provider]  Calcium Carb-Cholecalciferol (CALCIUM 600 + D PO) Take 1 tablet by mouth daily.    [provider]  carvedilol (COREG) 25 MG tablet TAKE 1 TABLET BY MOUTH TWICE DAILY WITH A MEAL 05/06/22   Jodelle Red, MD  docusate sodium (COLACE) 100 MG capsule Take 1 capsule (100 mg  total) by mouth 2 (two) times daily. 05/20/21   Cassandria Anger, PA-C  fluticasone (FLONASE) 50 MCG/ACT nasal spray Place 1 spray into both nostrils daily. 06/15/22   Charlott Holler, MD  furosemide (LASIX) 20 MG tablet Take 20-40 mg by mouth See admin instructions. Alternate taking 20 mg one day and 40 mg the next    [provider]  gabapentin (NEURONTIN) 300 MG capsule Take 300 mg by mouth 2 (two) times daily. 09/02/20   [provider]  irbesartan (AVAPRO) 150 MG tablet Take 1 tablet by mouth once daily 03/10/22   Jodelle Red, MD  levothyroxine (SYNTHROID, LEVOTHROID) 150 MCG tablet Take 75-150 mcg by mouth See admin instructions. Pt alternates taking 150 mcg one day and 75 mcg the next with before breakfast    [provider]  metFORMIN (GLUCOPHAGE-XR) 500 MG 24 hr tablet Take 2 tablets (1,000 mg total) by mouth 2 (two) times daily. Patient taking differently: Take 500 mg by mouth daily with breakfast. 09/25/20   Marinda Elk, MD  rosuvastatin (CRESTOR) 10 MG tablet Take 10 mg by mouth daily.    [provider]  spironolactone (ALDACTONE) 25 MG tablet Take 25 mg by mouth daily. 11/22/21   [provider]  Tetrahydrozoline-Zn Sulfate (ALLERGY RELIEF EYE DROPS OP) Place 1 drop into both eyes daily as needed (allergies).    [provider]  tiZANidine (ZANAFLEX) 4 MG tablet Take  4 mg by mouth every 8 (eight) hours as needed for muscle spasms. 02/10/22   [provider]  traMADol (ULTRAM) 50 MG tablet Take 50 mg by mouth daily as needed for moderate pain.    [provider]  zolpidem (AMBIEN) 10 MG tablet Take 10 mg by mouth at bedtime.    [provider]      Allergies    Sulfa antibiotics, Atorvastatin, Astelin [azelastine], and Amoxicillin    Review of Systems   Review of Systems  Physical Exam Updated Vital Signs BP (!) 155/42   Pulse (!) 52   Temp 98.7 F (37.1 C) (Oral)   Resp 18   Ht 5'  2" (1.575 m)   Wt 79.4 kg   SpO2 96%   BMI 32.01 kg/m  Physical Exam Vitals and nursing note reviewed.  Constitutional:      General: She is not in acute distress.    Appearance: She is well-developed.  HENT:     Head: Normocephalic and atraumatic.     Mouth/Throat:     Mouth: Mucous membranes are moist.  Eyes:     General: Vision grossly intact. Gaze aligned appropriately.     Extraocular Movements: Extraocular movements intact.     Conjunctiva/sclera: Conjunctivae normal.  Cardiovascular:     Rate and Rhythm: Normal rate and regular rhythm.     Pulses: Normal pulses.     Heart sounds: Normal heart sounds, S1 normal and S2 normal. No murmur heard.    No friction rub. No gallop.  Pulmonary:     Effort: Pulmonary effort is normal. No respiratory distress.     Breath sounds: Normal breath sounds.  Abdominal:     General: Bowel sounds are normal.     Palpations: Abdomen is soft.     Tenderness: There is no abdominal tenderness. There is no guarding or rebound.     Hernia: No hernia is present.  Musculoskeletal:        General: No swelling.     Cervical back: Full passive range of motion without pain, normal range of motion and neck supple. No spinous process tenderness or muscular tenderness. Normal range of motion.     Right lower leg: No edema.     Left lower leg: No edema.  Skin:    General: Skin is warm and dry.     Capillary Refill: Capillary refill takes less than 2 seconds.     Findings: No ecchymosis, erythema, rash or wound.  Neurological:     General: No focal deficit present.     Mental Status: She is alert and oriented to person, place, and time.     GCS: GCS eye subscore is 4. GCS verbal subscore is 5. GCS motor subscore is 6.     Cranial Nerves: Cranial nerves 2-12 are intact.     Sensory: Sensation is intact.     Motor: Motor function is intact.     Coordination: Coordination is intact.  Psychiatric:        Attention and Perception: Attention normal.         Mood and Affect: Mood normal.        Speech: Speech normal.        Behavior: Behavior normal.     ED Results / Procedures / Treatments   Labs (all labs ordered are listed, but only abnormal results are displayed) Labs Reviewed  CBC - Abnormal; Notable for the following components:      Result Value  WBC 14.3 (*)    RBC 3.81 (*)    Hemoglobin 11.1 (*)    HCT 33.8 (*)    RDW 15.7 (*)    Platelets 147 (*)    All other components within normal limits  COMPREHENSIVE METABOLIC PANEL - Abnormal; Notable for the following components:   CO2 20 (*)    Glucose, Bld 134 (*)    AST 46 (*)    ALT 63 (*)    Total Bilirubin 1.9 (*)    All other components within normal limits  TROPONIN I (HIGH SENSITIVITY) - Abnormal; Notable for the following components:   Troponin I (High Sensitivity) 26 (*)    All other components within normal limits  TROPONIN I (HIGH SENSITIVITY)    EKG None  Radiology DG Chest 2 View  Result Date: 07/26/2022 CLINICAL DATA:  Chest pain. EXAM: CHEST - 2 VIEW COMPARISON:  Chest radiograph dated 06/30/2022. FINDINGS: No focal consolidation, pleural effusion, or pneumothorax. The cardiac silhouette is within normal limits. No acute osseous pathology. IMPRESSION: No active cardiopulmonary disease. Electronically Signed   By: Elgie Collard M.D.   On: 07/26/2022 20:38    Procedures Procedures  {Document cardiac monitor, telemetry assessment procedure when appropriate:1}  Medications Ordered in ED Medications - No data to display  ED Course/ Medical Decision Making/ A&P   {   Click here for ABCD2, HEART and other calculatorsREFRESH Note before signing :1}                          Medical Decision Making Amount and/or Complexity of Data Reviewed Labs: ordered. Radiology: ordered. Discussion of management or test interpretation with external provider(s):                                    Anesthesia Evaluation  Patient identified by MRN, date of birth, ID  band Patient awake       Reviewed: Allergy & Precautions, NPO status , Patient's Chart, lab work & pertinent test results    Airway Mallampati: II   TM Distance: >3 FB Neck ROM: Full       Dental   (+) Dental Advisory Given   Pulmonary asthma , COPD   breath sounds clear to auscultation             Cardiovascular hypertension, Pt. on medications + CAD and +CHF  + Valvular Problems/Murmurs  Rhythm:Regular Rate:Normal       Neuro/Psych     GI/Hepatic negative GI ROS,,,Liver lesion   Endo/Other  diabetesHypothyroidism     Renal/GU Renal disease      Musculoskeletal   (+) Arthritis ,     Abdominal   Peds   Hematology   (+) Blood dyscrasia  Anesthesia Other Findings     Reproductive/Obstetrics                                                 Cardiac Cath 09/23/2020   Prox Cx lesion is 20% stenosed.   Mild nonobstructive CAD with tortuous vessels and smooth 20% narrowing in the proximal circumflex far artery.  The LAD appears angiographically normal.  The right coronary artery is a dominant vessel with superior takeoff.   Mild LV to aorta gradient on pullback  with a peak to peak gradient at approximately 16 mmHg.   RECOMMENDATION: Medical therapy.   Echo 09/23/2020 1. Left ventricular ejection fraction, by estimation, is 65 to 70%. The  left ventricle has normal function. The left ventricle has no regional  wall motion abnormalities. Left ventricular diastolic parameters are  consistent with Grade II diastolic  dysfunction (pseudonormalization).   2. Right ventricular systolic function is normal. The right ventricular  size is normal.   3. Left atrial size was mildly dilated.   4. There is systolic anterior motion of the MV leaflet. . The mitral  valve is normal in structure. Moderate mitral valve regurgitation.   5. The aortic valve is normal in structure. Aortic valve regurgitation is  not visualized.    Myocardial  Perfusion 02/23/2018  Nuclear stress EF: 63%.  The study is normal.  This is a low risk study.  The left ventricular ejection fraction is normal (55-65%).   ***  {Document critical care time when appropriate:1} {Document review of labs and clinical decision tools ie heart score, Chads2Vasc2 etc:1}  {Document your independent review of radiology images, and any outside records:1} {Document your discussion with family members, caretakers, and with consultants:1} {Document social determinants of health affecting pt's care:1} {Document your decision making why or why not admission, treatments were needed:1} Final Clinical Impression(s) / ED Diagnoses Final diagnoses:  None    Rx / DC Orders ED Discharge Orders     None

## 2022-07-26 NOTE — ED Triage Notes (Signed)
Pt states that she was lying in bed today at 1500 when she had sudden onset of chest pressure. Pt also reports fever today of 102 at home, which she took tylenol for. Pt is a cancer patient and had an ablation of liver tumor last week.

## 2022-07-26 NOTE — Telephone Encounter (Signed)
  Chief Complaint: Fever 102.7 - recent sx Symptoms: above - chills Frequency: today Pertinent Negatives: Patient denies any other s/s Disposition: [] ED /[x] Urgent Care (no appt availability in office) / [] Appointment(In office/virtual)/ []  [facility]/ [] Home Care/ [] Refused Recommended Disposition /[] [facility]/ []  Follow-up with PCP Additional Notes: Pt states that fever started today. Bandage for sx is nearly off from sweating. Pt also has chills. Pt will go to UC for care.   Summary: Fever   Pt is calling because she has a fever of 102.7 with no other symptoms but would like advice on what to do.     Reason for Disposition  [1] Fever > 101 F (38.3 C) AND [2] age > 60 years  Answer Assessment - Initial Assessment Questions 1. TEMPERATURE: "What is the most recent temperature?"  "How was it measured?"      102.7 2. ONSET: "When did the fever start?"      Today 3. CHILLS: "Do you have chills?" If yes: "How bad are they?"  (e.g., none, mild, moderate, severe)   - NONE: no chills   - MILD: feeling cold   - MODERATE: feeling very cold, some shivering (feels better under a thick blanket)   - SEVERE: feeling extremely cold with shaking chills (general body shaking, rigors; even under a thick blanket)      yes 4. OTHER SYMPTOMS: "Do you have any other symptoms besides the fever?"  (e.g., abdomen pain, cough, diarrhea, earache, headache, sore throat, urination pain)     No - bandage in groin is sweated off 5. CAUSE: If there are no symptoms, ask: "What do you think is causing the fever?"      *No Answer* 7. TREATMENT: "What have you done so far to treat this fever?" (e.g., medications)     *No Answer* 8. IMMUNOCOMPROMISE: "Do you have of the following: diabetes, HIV positive, splenectomy, cancer chemotherapy, chronic steroid treatment, transplant patient, etc."     *No Answer*  Protocols used: [facility]

## 2022-07-27 ENCOUNTER — Emergency Department (HOSPITAL_COMMUNITY): Payer: Medicare Other

## 2022-07-27 ENCOUNTER — Encounter (HOSPITAL_COMMUNITY): Payer: Self-pay

## 2022-07-27 LAB — URINALYSIS, W/ REFLEX TO CULTURE (INFECTION SUSPECTED)
Bacteria, UA: NONE SEEN
Bilirubin Urine: NEGATIVE
Glucose, UA: NEGATIVE mg/dL
Hgb urine dipstick: NEGATIVE
Leukocytes,Ua: NEGATIVE
Nitrite: NEGATIVE
Protein, ur: NEGATIVE mg/dL
Specific Gravity, Urine: <=1.005 (ref 1.005–1.030)
pH: 7 (ref 5.0–8.0)

## 2022-07-27 LAB — TROPONIN I (HIGH SENSITIVITY)
Troponin I (High Sensitivity): 33 ng/L — ABNORMAL HIGH (ref <18)
Troponin I (High Sensitivity): 33 ng/L — ABNORMAL HIGH (ref <18)

## 2022-07-27 MED ORDER — IOHEXOL 350 MG/ML SOLN
100.0000 mL | Freq: Once | INTRAVENOUS | Status: AC | PRN
  Administered 2022-07-27: 100 mL via INTRAVENOUS

## 2022-07-27 MED ORDER — NITROFURANTOIN MONOHYD MACRO 100 MG PO CAPS: 100.0000 mg | ORAL_CAPSULE | Freq: Two times a day (BID) | ORAL | 0 refills | Status: DC

## 2022-08-02 ENCOUNTER — Telehealth: Payer: Self-pay | Admitting: Interventional Radiology

## 2022-08-02 NOTE — Telephone Encounter (Signed)
Pt called IR clinic today to report bruising involving her lower abdomen.  In the interval, the pt was seen at the [facility] ED for fever where CT of the abdomen and pelvis was performed demonstrating no change in size of known right pelvic side wall/RP hematoma.  Presently, the pt denies fever or chills.  No worsening abdominal pain.  No light headedness and is otherwise back to her baseline state of health.  I explained that the bruising is likely the delayed result of the pelvic/RP hematoma and will likely resolve as any bruise will with transition from red to brown to yellow and should be treated conservatively.  Pt demonstrated excellent understanding and knows to call the IR clinic with any interval questions or concerns.  Follow-up tele consult has been arranged in 2-3 weeks.  Katherina Right, MD Pager #: [phone]

## 2022-08-06 ENCOUNTER — Inpatient Hospital Stay: Payer: Medicare Other | Admitting: Hematology and Oncology

## 2022-08-06 ENCOUNTER — Other Ambulatory Visit: Payer: Self-pay | Admitting: *Deleted

## 2022-08-06 ENCOUNTER — Inpatient Hospital Stay: Payer: Medicare Other | Attending: Hematology and Oncology

## 2022-08-06 ENCOUNTER — Other Ambulatory Visit: Payer: Self-pay

## 2022-08-06 VITALS — BP 146/51 | HR 68 | Temp 97.7°F | Resp 18 | Ht 62.0 in | Wt 177.9 lb

## 2022-08-06 DIAGNOSIS — K7460 Unspecified cirrhosis of liver: Secondary | ICD-10-CM | POA: Diagnosis not present

## 2022-08-06 DIAGNOSIS — Z79899 Other long term (current) drug therapy: Secondary | ICD-10-CM | POA: Insufficient documentation

## 2022-08-06 DIAGNOSIS — D696 Thrombocytopenia, unspecified: Secondary | ICD-10-CM

## 2022-08-06 DIAGNOSIS — C220 Liver cell carcinoma: Secondary | ICD-10-CM | POA: Diagnosis not present

## 2022-08-06 LAB — CMP (CANCER CENTER ONLY)
ALT: 32 U/L (ref 0–44)
AST: 39 U/L (ref 15–41)
Albumin: 3.4 g/dL — ABNORMAL LOW (ref 3.5–5.0)
Alkaline Phosphatase: 98 U/L (ref 38–126)
Anion gap: 4 — ABNORMAL LOW (ref 5–15)
BUN: 13 mg/dL (ref 8–23)
CO2: 26 mmol/L (ref 22–32)
Calcium: 9.5 mg/dL (ref 8.9–10.3)
Chloride: 109 mmol/L (ref 98–111)
Creatinine: 0.55 mg/dL (ref 0.44–1.00)
GFR, Estimated: >60 mL/min (ref >60)
Glucose, Bld: 84 mg/dL (ref 70–99)
Potassium: 4.3 mmol/L (ref 3.5–5.1)
Sodium: 139 mmol/L (ref 135–145)
Total Bilirubin: 1 mg/dL (ref 0.3–1.2)
Total Protein: 6.7 g/dL (ref 6.5–8.1)

## 2022-08-06 LAB — CBC WITH DIFFERENTIAL (CANCER CENTER ONLY)
Abs Immature Granulocytes: 0.01 10*3/uL (ref 0.00–0.07)
Basophils Absolute: 0 10*3/uL (ref 0.0–0.1)
Basophils Relative: 1 %
Eosinophils Absolute: 0.2 10*3/uL (ref 0.0–0.5)
Eosinophils Relative: 4 %
HCT: 34.3 % — ABNORMAL LOW (ref 36.0–46.0)
Hemoglobin: 11.2 g/dL — ABNORMAL LOW (ref 12.0–15.0)
Immature Granulocytes: 0 %
Lymphocytes Relative: 15 %
Lymphs Abs: 0.6 10*3/uL — ABNORMAL LOW (ref 0.7–4.0)
MCH: 29.6 pg (ref 26.0–34.0)
MCHC: 32.7 g/dL (ref 30.0–36.0)
MCV: 90.7 fL (ref 80.0–100.0)
Monocytes Absolute: 0.4 10*3/uL (ref 0.1–1.0)
Monocytes Relative: 9 %
Neutro Abs: 2.9 10*3/uL (ref 1.7–7.7)
Neutrophils Relative %: 71 %
Platelet Count: 106 10*3/uL — ABNORMAL LOW (ref 150–400)
RBC: 3.78 MIL/uL — ABNORMAL LOW (ref 3.87–5.11)
RDW: 16.7 % — ABNORMAL HIGH (ref 11.5–15.5)
WBC Count: 4.1 10*3/uL (ref 4.0–10.5)
nRBC: 0 % (ref 0.0–0.2)

## 2022-08-06 NOTE — Progress Notes (Unsigned)
[facility] [phone]   [phone]  PROGRESS NOTE  Patient Care Team: Ralene Ok, MD as PCP - General (Internal Medicine) Jodelle Red, MD as PCP - Cardiology (Cardiology) Jaci Standard, MD as Consulting Physician (Hematology and Oncology)  Hematological/Oncological History # Hepatocellular Carcinoma, Stage 1b 04/13/2022: Patient underwent a CT scan of the chest wall emergency department for hemoptysis.  The scan showed no clear source of the bleeding but the report noted hepatic cirrhosis with enlarging heterogeneous enhancing lesion centrally in the right hepatic lobe, suspicious for hepatocellular carcinoma. Recommend further evaluation with abdominal MRI without and with contrast. 04/22/2022: MRI abdomen performed which showed. 3.1 x 3.1 cm early arterial phase enhancing lesion in segment 8 with subsequent washout and capsular enhancement. Findings consistent with hepatocellular carcinoma, LI-RADS 5 07/05/2022: started evaluation for liver transplant at [facility]  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 Leonides Schanz     Interval History:  [name] 72 y.o. female with medical history significant for newly diagnosed [facility] who presents for a follow up visit. The patient's last visit was on 05/14/2021. In the interim since the last visit she underwent a mesenteric arteriogram, embolization, and percutaneous microwave hepatic ablation with IR on 07/21/2022.   On exam today [name] is accompanied by her husband.  She reports her embolization procedure went well.  She reported that she developed a "tear" and had a lot of bleeding  afterwards with some subcutaneous bruising.  She reports that she does still have a little bit of tenderness but did not have any major issues of pain after the procedure.  She notes her energy is improving every day and is currently about a 6 out of 10.  Her appetite is good and she has a strong desire to eat.  She reports that otherwise she has been her baseline level of health with no fevers, chills, sweats and no infectious symptoms such as runny nose, sore throat, or cough.  She notes she is not having any abdominal discomfort and denies any nausea, vomiting, or diarrhea.  A full 10 point ROS was otherwise negative.  MEDICAL HISTORY:  Past Medical History:  Diagnosis Date   Anxiety    Arthritis    Asthma    Blood dyscrasia    Thrombocytopenia   CAD (coronary artery disease)    CHF (congestive heart failure) (HCC)    Complication of anesthesia    trouble waking up once with Nissen Fundoplication   COPD (chronic obstructive pulmonary disease) (HCC)    Depression    Diabetes mellitus without complication (HCC)    Dysrhythmia    Heart murmur    hepatocellular ca    Hypertension    Hypothyroidism    Liver disease    NAFLD cirrhosis   Mild aortic stenosis    Pneumonia    Sarcoidosis    Sarcoidosis of lung (HCC)    Sarcoidosis of lymph nodes     SURGICAL HISTORY: Past Surgical History:  Procedure Laterality Date   ABDOMINAL HYSTERECTOMY     ABDOMINAL SURGERY     CHOLECYSTECTOMY     ESOPHAGOGASTRODUODENOSCOPY (EGD) WITH PROPOFOL N/A 04/17/2021   Procedure: ESOPHAGOGASTRODUODENOSCOPY (EGD) WITH PROPOFOL;  Surgeon: Jeani Hawking, MD;  Location: [facility];  Service: Gastroenterology;  Laterality: N/A;   HERNIA REPAIR     IR 3D INDEPENDENT WKST  07/21/2022   IR ANGIOGRAM SELECTIVE EACH ADDITIONAL VESSEL  07/21/2022   IR ANGIOGRAM VISCERAL SELECTIVE  07/21/2022   IR EMBO TUMOR ORGAN ISCHEMIA INFARCT INC GUIDE ROADMAPPING  07/21/2022   IR GUIDED LIVER TUMOR ABLATION RFA PERC  07/21/2022    IR US GUIDE VASC ACCESS RIGHT  07/21/2022   LASIK  2001   LEFT HEART CATH AND CORONARY ANGIOGRAPHY N/A 09/23/2020   Procedure: LEFT HEART CATH AND CORONARY ANGIOGRAPHY;  Surgeon: Lennette Bihari, MD;  Location: [facility];  Service: Cardiovascular;  Laterality: N/A;   nissenfundiplication     RADIOLOGY WITH ANESTHESIA N/A 07/21/2022   Procedure: IR WITH ANESTHESIA MICROWAVE ABLATION;  Surgeon: Simonne Come, MD;  Location: [facility];  Service: Anesthesiology;  Laterality: N/A;   TOTAL KNEE ARTHROPLASTY Right 05/19/2021   Procedure: TOTAL KNEE ARTHROPLASTY;  Surgeon: Durene Romans, MD;  Location: [facility];  Service: Orthopedics;  Laterality: Right;    SOCIAL HISTORY: Social History   Socioeconomic History   Marital status: Married    Spouse name: Not on file   Number of children: Not on file   Years of education: Not on file   Highest education level: Not on file  Occupational History   Not on file  Tobacco Use   Smoking status: Never    Passive exposure: Past (minimal)   Smokeless tobacco: Never  Vaping Use   Vaping status: Never Used  Substance and Sexual Activity   Alcohol use: No   Drug use: No   Sexual activity: Not Currently  Other Topics Concern   Not on file  Social History Narrative   Not on file   Social Determinants of Health   Financial Resource Strain: Not on file  Food Insecurity: No Food Insecurity (07/24/2022)   Hunger Vital Sign    Worried About Running Out of Food in the Last Year: Never true    Ran Out of Food in the Last Year: Never true  Transportation Needs: No Transportation Needs (07/24/2022)   PRAPARE - Administrator, Civil Service (Medical): No    Lack of Transportation (Non-Medical): No  Physical Activity: Not on file  Stress: Not on file  Social Connections: Not on file  Intimate Partner Violence: Not At Risk (07/24/2022)   Humiliation, Afraid, Rape, and Kick questionnaire    Fear of Current or Ex-Partner: No    Emotionally  Abused: No    Physically Abused: No    Sexually Abused: No    FAMILY HISTORY: Family History  Problem Relation Age of Onset   Diabetes Mother    Heart attack Mother    Heart failure Father    Hypertension Father    Heart attack Father    Heart Problems Sister    Blindness Sister    Diabetes Sister    Hypertension Sister    Heart attack Brother    Heart attack Brother    Throat cancer Brother    Healthy Son    Rheum arthritis Daughter     ALLERGIES:  is allergic to sulfa antibiotics, atorvastatin, astelin [azelastine], and amoxicillin.  MEDICATIONS:  Current Outpatient Medications  Medication Sig Dispense Refill   acetaminophen (TYLENOL) 325 MG tablet Take 650 mg by mouth every 6 (six) hours as needed for moderate pain.     acyclovir (ZOVIRAX) 400 MG tablet  Take 400 mg by mouth every 8 (eight) hours as needed (fever blisters). Fever blisters     albuterol (VENTOLIN HFA) 108 (90 Base) MCG/ACT inhaler Inhale 2 puffs into the lungs every 6 (six) hours as needed. (Patient taking differently: Inhale 2 puffs into the lungs every 6 (six) hours as needed for shortness of breath or wheezing.) 18 g 5   ALPRAZolam (XANAX) 0.25 MG tablet Take 0.25 mg by mouth 3 (three) times daily as needed for anxiety.     amLODipine (NORVASC) 10 MG tablet Take 1 tablet (10 mg total) by mouth daily. 90 tablet 3   aspirin EC 81 MG tablet Take 81 mg by mouth daily. Swallow whole.     Biotin 10 MG CAPS Take 10 mg by mouth daily.     Calcium Carb-Cholecalciferol (CALCIUM 600 + D PO) Take 1 tablet by mouth daily.     carvedilol (COREG) 25 MG tablet TAKE 1 TABLET BY MOUTH TWICE DAILY WITH A MEAL 180 tablet 1   docusate sodium (COLACE) 100 MG capsule Take 1 capsule (100 mg total) by mouth 2 (two) times daily. (Patient taking differently: Take 200 mg by mouth daily.) 10 capsule 0   fluticasone (FLONASE) 50 MCG/ACT nasal spray Place 1 spray into both nostrils daily. (Patient taking differently: Place 1 spray into  both nostrils at bedtime.) 16 g 11   furosemide (LASIX) 20 MG tablet Take 20-40 mg by mouth as directed. Take 1 tablet (20 mg) daily and Take 2 tablets (40 mg) Daily PRN for Swelling     gabapentin (NEURONTIN) 300 MG capsule Take 300 mg by mouth 2 (two) times daily.     irbesartan (AVAPRO) 150 MG tablet Take 1 tablet by mouth once daily 90 tablet 1   levothyroxine (SYNTHROID, LEVOTHROID) 150 MCG tablet Take 75-150 mcg by mouth See admin instructions. Pt alternates taking 150 mcg one day and 75 mcg the next with before breakfast     metFORMIN (GLUCOPHAGE-XR) 500 MG 24 hr tablet Take 2 tablets (1,000 mg total) by mouth 2 (two) times daily. (Patient taking differently: Take 500 mg by mouth daily with breakfast.) 120 tablet 3   nitrofurantoin, macrocrystal-monohydrate, (MACROBID) 100 MG capsule Take 1 capsule (100 mg total) by mouth 2 (two) times daily. X 7 days 14 capsule 0   rosuvastatin (CRESTOR) 10 MG tablet Take 10 mg by mouth daily.     Semaglutide,0.25 or 0.5MG /DOS, (OZEMPIC, 0.25 OR 0.5 MG/DOSE,) 2 MG/3ML SOPN Inject 0.5 mg into the skin once a week.     spironolactone (ALDACTONE) 25 MG tablet Take 25 mg by mouth daily.     Tetrahydrozoline-Zn Sulfate (ALLERGY RELIEF EYE DROPS OP) Place 1 drop into both eyes daily as needed (allergies).     tiZANidine (ZANAFLEX) 4 MG tablet Take 4 mg by mouth every 8 (eight) hours as needed for muscle spasms.     zolpidem (AMBIEN) 10 MG tablet Take 10 mg by mouth at bedtime.     No current facility-administered medications for this visit.    REVIEW OF SYSTEMS:   Constitutional: ( - ) fevers, ( - )  chills , ( - ) night sweats Eyes: ( - ) blurriness of vision, ( - ) double vision, ( - ) watery eyes Ears, nose, mouth, throat, and face: ( - ) mucositis, ( - ) sore throat Respiratory: ( - ) cough, ( - ) dyspnea, ( - ) wheezes Cardiovascular: ( - ) palpitation, ( - ) chest discomfort, ( - )  lower extremity swelling Gastrointestinal:  ( - ) nausea, ( - )  heartburn, ( - ) change in bowel habits Skin: ( - ) abnormal skin rashes Lymphatics: ( - ) new lymphadenopathy, ( - ) easy bruising Neurological: ( - ) numbness, ( - ) tingling, ( - ) new weaknesses Behavioral/Psych: ( - ) mood change, ( - ) new changes  All other systems were reviewed with the patient and are negative.  PHYSICAL EXAMINATION: ECOG PERFORMANCE STATUS: 0 - Asymptomatic  Vitals:   08/06/22 1448  BP: (!) 146/51  Pulse: 68  Resp: 18  Temp: 97.7 F (36.5 C)  SpO2: 99%    Filed Weights   08/06/22 1448  Weight: 177 lb 14.4 oz (80.7 kg)     GENERAL: Well-appearing elderly Caucasian female, alert, no distress and comfortable SKIN: skin color, texture, turgor are normal, no rashes or significant lesions EYES: conjunctiva are pink and non-injected, sclera clear LUNGS: clear to auscultation and percussion with normal breathing effort HEART: regular rate & rhythm and no murmurs and no lower extremity edema Musculoskeletal: no cyanosis of digits and no clubbing  PSYCH: alert & oriented x 3, fluent speech NEURO: no focal motor/sensory deficits  LABORATORY DATA:  I have reviewed the data as listed    Latest Ref Rng & Units 08/06/2022    2:33 PM 07/26/2022    7:56 PM 07/24/2022    9:08 AM  CBC  WBC 4.0 - 10.5 K/uL 4.1  14.3    Hemoglobin 12.0 - 15.0 g/dL 40.9  81.1  91.4   Hematocrit 36.0 - 46.0 % 34.3  33.8  31.4   Platelets 150 - 400 K/uL 106  147         Latest Ref Rng & Units 08/06/2022    2:33 PM 07/26/2022    7:56 PM 07/23/2022    4:28 AM  CMP  Glucose 70 - 99 mg/dL 84  782  956   BUN 8 - 23 mg/dL 13  19  52   Creatinine 0.44 - 1.00 mg/dL 2.13  0.86  5.78   Sodium 135 - 145 mmol/L 139  136  135   Potassium 3.5 - 5.1 mmol/L 4.3  4.4  4.9   Chloride 98 - 111 mmol/L 109  109  107   CO2 22 - 32 mmol/L 26  20  22    Calcium 8.9 - 10.3 mg/dL 9.5  8.9  9.1   Total Protein 6.5 - 8.1 g/dL 6.7  7.3  6.0   Total Bilirubin 0.3 - 1.2 mg/dL 1.0  1.9  0.3   Alkaline Phos  38 - 126 U/L 98  97  80   AST 15 - 41 U/L 39  46  132   ALT 0 - 44 U/L 32  63  141     RADIOGRAPHIC STUDIES: CT ABDOMEN PELVIS W CONTRAST  Result Date: 07/27/2022 CLINICAL DATA:  History of hepatocellular carcinoma status post liver ablation. Abdominal pain, acute, nonlocalized EXAM: CT ABDOMEN AND PELVIS WITH CONTRAST TECHNIQUE: Multidetector CT imaging of the abdomen and pelvis was performed using the standard protocol following bolus administration of intravenous contrast. RADIATION DOSE REDUCTION: This exam was performed according to the departmental dose-optimization program which includes automated exposure control, adjustment of the mA and/or kV according to patient size and/or use of iterative reconstruction technique. CONTRAST:  OMNIPAQUE IOHEXOL 350 MG/ML SOLN COMPARISON:  None Available. FINDINGS: Lower chest: 07/22/2022 Hepatobiliary: Again seen is the central hepatic lesion measuring up to  4 cm with central high density compatible with ablation. Ablation tract noted running anteriorly to the anterior liver capsule. Nodular contours of the liver compatible with cirrhosis. Prior cholecystectomy. Pancreas: No focal abnormality or ductal dilatation. Spleen: Low-density areas within the superior portion of the spleen are new since prior study and may reflect splenic infarcts. Normal size. Adrenals/Urinary Tract: No adrenal abnormality. No focal renal abnormality. No stones or hydronephrosis. Urinary bladder is unremarkable. Stomach/Bowel: Normal appendix. Stomach, large and small bowel grossly unremarkable. Vascular/Lymphatic: Aortoiliac atherosclerosis. No evidence of aneurysm or adenopathy. Reproductive: Prior hysterectomy.  No adnexal masses. Other: Right retroperitoneal/extraperitoneal hematoma again noted, stable since prior study. Mass effect on the bladder. No free fluid in the peritoneum. No free air. Musculoskeletal: No acute bony abnormality. IMPRESSION: Central hepatic lesion status  post ablation. No visible complicating feature. Cirrhosis. Ill-defined low-density areas in the superior aspect of the spleen, new since prior contrast study from 06/16/2022 most compatible with splenic infarcts. Right retroperitoneal/extraperitoneal hematoma again noted, unchanged. Aortoiliac atheromatous crash that aortic atherosclerosis. Small hiatal hernia. Electronically Signed   By: Charlett Nose M.D.   On: 07/27/2022 01:27   CT Angio Chest Pulmonary Embolism (PE) W or WO Contrast  Result Date: 07/27/2022 CLINICAL DATA:  Pulmonary embolism (PE) suspected, high prob. Chest pressure. History of opacity alert carcinoma. EXAM: CT ANGIOGRAPHY CHEST WITH CONTRAST TECHNIQUE: Multidetector CT imaging of the chest was performed using the standard protocol during bolus administration of intravenous contrast. Multiplanar CT image reconstructions and MIPs were obtained to evaluate the vascular anatomy. RADIATION DOSE REDUCTION: This exam was performed according to the departmental dose-optimization program which includes automated exposure control, adjustment of the mA and/or kV according to patient size and/or use of iterative reconstruction technique. CONTRAST:  OMNIPAQUE IOHEXOL 350 MG/ML SOLN COMPARISON:  04/13/2022 FINDINGS: Cardiovascular: No filling defects in the pulmonary arteries to suggest pulmonary emboli. Heart is normal size. Aorta is normal caliber. Aortic calcifications. Mediastinum/Nodes: Mediastinal adenopathy again noted, stable since prior study. Borderline sized bilateral hilar lymph nodes, unchanged. No axillary adenopathy. Trachea and esophagus are unremarkable. Diffuse enlargement of the thyroid. This is unchanged. This has been evaluated on previous imaging. (ref: J Am Coll Radiol. 2015 Feb;12(2): 143-50). Lungs/Pleura: No confluent airspace opacities or effusions. Upper Abdomen: See abdominal CT report Musculoskeletal: Chest wall soft tissues are unremarkable. No acute bony abnormality.  Review of the MIP images confirms the above findings. IMPRESSION: No evidence of pulmonary embolus. Stable mediastinal adenopathy. Stable bilateral hilar lymph node prominence. Aortic Atherosclerosis (ICD10-I70.0). Electronically Signed   By: Charlett Nose M.D.   On: 07/27/2022 01:22   DG Chest 2 View  Result Date: 07/26/2022 CLINICAL DATA:  Chest pain. EXAM: CHEST - 2 VIEW COMPARISON:  Chest radiograph dated 06/30/2022. FINDINGS: No focal consolidation, pleural effusion, or pneumothorax. The cardiac silhouette is within normal limits. No acute osseous pathology. IMPRESSION: No active cardiopulmonary disease. Electronically Signed   By: Elgie Collard M.D.   On: 07/26/2022 20:38   CT ABDOMEN PELVIS WO CONTRAST  Result Date: 07/22/2022 CLINICAL DATA:  72 year old female status post transarterial bland embolization via right groin puncture with concurrent microwave ablation of hepatocellular carcinoma. She has new onset dizziness, presyncope and decreasing hemoglobin concerning for hemorrhage. EXAM: CT ABDOMEN AND PELVIS WITHOUT CONTRAST TECHNIQUE: Multidetector CT imaging of the abdomen and pelvis was performed following the standard protocol without IV contrast. RADIATION DOSE REDUCTION: This exam was performed according to the departmental dose-optimization program which includes automated exposure control, adjustment of the mA  and/or kV according to patient size and/or use of iterative reconstruction technique. COMPARISON:  Prior CT scan of the abdomen and pelvis 06/16/2022 FINDINGS: Lower chest: No acute abnormality. Hepatobiliary: Hyperdense contrast with a few locules of contained air present within the central dome of the right liver consistent with recent bland embolization and percutaneous ablation. The ablation tract is faintly visible extending to the liver capsule. No evidence of perihepatic hemorrhage. Cirrhotic liver morphology again noted. The gallbladder is surgically absent. No biliary ductal  dilatation. Pancreas: Unremarkable. No pancreatic ductal dilatation or surrounding inflammatory changes. Spleen: Normal in size without focal abnormality. Adrenals/Urinary Tract: Adrenal glands are unremarkable. Kidneys are normal, without renal calculi, focal lesion, or hydronephrosis. Bladder is displaced from right to left due to the adjacent hematoma. Small amount of air in the bladder consistent with recent Foley catheter placement. Stomach/Bowel: No evidence of obstruction or focal bowel wall thickening. Vascular/Lymphatic: Limited evaluation in the absence of intravenous contrast. No evidence of aneurysm. Scattered atherosclerotic plaque throughout the abdominal aorta and branch arteries. Reproductive: Status post hysterectomy. No adnexal masses. Other: High density amorphous fluid tracks along the right pelvic sidewall and into the inferior aspect of the right retroperitoneal space. Findings are consistent with hematoma. There is mild local mass effect with displacement of the bladder from the right to the left. Precise measurement is difficult given the irregularity of the shape. The most concentrated portion of the hematoma in the low right pelvic sidewall measures approximately 10.4 x 5.5 cm (image 74 series 2). Musculoskeletal: No acute or significant osseous findings. IMPRESSION: 1. Positive for moderate retroperitoneal hematoma resulting in local mass effect and displacement of the bladder from the right to the left. 2. Expected appearance of transarterial bland embolization and percutaneous microwave ablation site in the hepatic dome. No evidence of bleeding or complication. 3. Hepatic cirrhosis. Electronically Signed   By: Malachy Moan M.D.   On: 07/22/2022 09:57   IR GUIDED LIVER TUMOR ABLATION RFA PERC  Result Date: 07/21/2022 INDICATION: History of hepatocellular carcinoma. Patient presents today for mesenteric arteriogram, bland embolization and percutaneous ultrasound and fluoroscopic  guided hepatic lesion microwave ablation. Please refer to formal consultations dictated in the EMR (EPIC) on 06/03/2022 for additional details. EXAM: 1. ULTRASOUND GUIDANCE FOR ARTERIAL ACCESS 2. SELECTIVE CELIAC ARTERIOGRAM 3. SUB SELECTIVE ARTERIOGRAM AND FLUOROSCOPIC GUIDED BLAND EMBOLIZATION OF THE MEDIAL DIVISION OF THE RIGHT HEPATIC ARTERY 4. ULTRASOUND AND FLUOROSCOPIC GUIDED THERMAL ABLATION OF LIVER LESION ANESTHESIA/SEDATION: General MEDICATIONS: Rocephin 1 gm IV. The antibiotic was administered in an appropriate time interval prior to needle puncture of the skin. CONTRAST:  55 cc Omnipaque 300 COMPLICATIONS: None immediate. PROCEDURE: The procedure, risks, benefits, and alternatives were explained to the patient. Questions regarding the procedure were encouraged and answered. The patient understands and consents to the procedure. The patient was placed under general anesthesia. Sonographic evaluation performed of the right upper abdomen demonstrating the at least 3.2 cm hypoechoic mass within the superior aspect of the right lobe of the liver. The procedure was planned. Skin overlying the right groin as well as the right upper abdominal quadrant was prepped draped in sterile fashion. A time-out performed prior initiation of the procedure Initially, under direct ultrasound guidance, the right common femoral artery was accessed with a micropuncture kit allowing for placement of a 5 French vascular sheath. Closure arteriogram was performed via the side arm of the sheath. Next, over a Bentson wire, a Mickelson catheter was advanced to the level of the thoracic aorta where  it was forearm, back blood flushed. Catheter was then utilized to select the celiac artery and a selective celiac arteriogram was performed With the use of a fathom 14 microwire, a TriNav anti-reflux microcatheter was utilized to select the lateral division of the right hepatic artery. Sub selective arteriogram performed. Next, under direct  ultrasound guidance, a Neuwave PR percutaneous microwave ablation probe was advanced into the cranial aspect of the hepatic lesion under direct ultrasound guidance. Ultrasound images were saved for procedural documentation purposes. Sub selective injection was then performed of the lateral division of the right hepatic artery microwave ablation probe was slightly repositioned under direct fluoroscopic guidance. At this time, bland embolization was performed of the lateral division right hepatic artery with approximately 1/3 vial of 100-300 micron Embospheres to vessel stasis. At this time, the microcatheter and Mickelson catheter were removed. Next, a 10-minute ablation was performed at 65 watts under intermittent sonographic guidance. While the microwave ablation was being performed, the vascular sheath removed and hemostasis was achieved at the right groin access site with manual compression. Following the 10 minute ablation, tract ablation was performed and microwave probe was removed intact. Hemostasis achieved with manual compression Dressings were applied at both locations the procedure was terminated. Patient tolerated the procedure well without immediate postprocedural complication. FINDINGS: Sonographic evaluation abdomen demonstrates an approximately 3.2 cm hypoechoic mass within the superior aspect of the right lobe of the liver, correlating with the findings seen preceding abdominal CTA. Celiac arteriogram demonstrates somewhat unconventional hepatic arterial anatomy with separate origins of the medial and lateral divisions of the left hepatic artery. The hypervascular mass within the dome of the right lobe of the liver is supplied predominantly via lateral division of the right hepatic artery, confirmed with sub selective arteriogram of the lateral division right artery. Under a combination of ultrasound and fluoroscopic guidance, a PR microwave ablation probe was appropriately positioned within  hypervascular tumor. Following probe positioning, bland embolization was performed tumor via the microcatheter. Following bland embolization, a 10 minute ablation was performed at 65 watts under intermittent sonographic guidance which demonstrated the ablation zone encompassing entirety of hypoechoic mass. 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 PLAN: - The patient will be admitted overnight for continued observation and PCA usage. - Follow-up telemedicine consultation will be performed in approximately 3-4 weeks. At that time, a CMP level be obtained. Initial postprocedural MRI will be performed in 3 months (mid February 2018). Electronically Signed   By: Simonne Come M.D.   On: 07/21/2022 12:57   IR EMBO TUMOR ORGAN ISCHEMIA INFARCT INC GUIDE ROADMAPPING  Result Date: 07/21/2022 INDICATION: History of hepatocellular carcinoma. Patient presents today for mesenteric arteriogram, bland embolization and percutaneous ultrasound and fluoroscopic guided hepatic lesion microwave ablation. Please refer to formal consultations dictated in the EMR (EPIC) on 06/03/2022 for additional details. EXAM: 1. ULTRASOUND GUIDANCE FOR ARTERIAL ACCESS 2. SELECTIVE CELIAC ARTERIOGRAM 3. SUB SELECTIVE ARTERIOGRAM AND FLUOROSCOPIC GUIDED BLAND EMBOLIZATION OF THE MEDIAL DIVISION OF THE RIGHT HEPATIC ARTERY 4. ULTRASOUND AND FLUOROSCOPIC GUIDED THERMAL ABLATION OF LIVER LESION ANESTHESIA/SEDATION: General MEDICATIONS: Rocephin 1 gm IV. The antibiotic was administered in an appropriate time interval prior to needle puncture of the skin. CONTRAST:  55 cc Omnipaque 300 COMPLICATIONS: None immediate. PROCEDURE: The procedure, risks, benefits, and alternatives were explained to the patient. Questions regarding the procedure were encouraged and answered. The patient understands and consents to the  procedure.  The patient was placed under general anesthesia. Sonographic evaluation performed of the right upper abdomen demonstrating the at least 3.2 cm hypoechoic mass within the superior aspect of the right lobe of the liver. The procedure was planned. Skin overlying the right groin as well as the right upper abdominal quadrant was prepped draped in sterile fashion. A time-out performed prior initiation of the procedure Initially, under direct ultrasound guidance, the right common femoral artery was accessed with a micropuncture kit allowing for placement of a 5 French vascular sheath. Closure arteriogram was performed via the side arm of the sheath. Next, over a Bentson wire, a Mickelson catheter was advanced to the level of the thoracic aorta where it was forearm, back blood flushed. Catheter was then utilized to select the celiac artery and a selective celiac arteriogram was performed With the use of a fathom 14 microwire, a TriNav anti-reflux microcatheter was utilized to select the lateral division of the right hepatic artery. Sub selective arteriogram performed. Next, under direct ultrasound guidance, a Neuwave PR percutaneous microwave ablation probe was advanced into the cranial aspect of the hepatic lesion under direct ultrasound guidance. Ultrasound images were saved for procedural documentation purposes. Sub selective injection was then performed of the lateral division of the right hepatic artery microwave ablation probe was slightly repositioned under direct fluoroscopic guidance. At this time, bland embolization was performed of the lateral division right hepatic artery with approximately 1/3 vial of 100-300 micron Embospheres to vessel stasis. At this time, the microcatheter and Mickelson catheter were removed. Next, a 10-minute ablation was performed at 65 watts under intermittent sonographic guidance. While the microwave ablation was being performed, the vascular sheath removed and hemostasis was  achieved at the right groin access site with manual compression. Following the 10 minute ablation, tract ablation was performed and microwave probe was removed intact. Hemostasis achieved with manual compression Dressings were applied at both locations the procedure was terminated. Patient tolerated the procedure well without immediate postprocedural complication. FINDINGS: Sonographic evaluation abdomen demonstrates an approximately 3.2 cm hypoechoic mass within the superior aspect of the right lobe of the liver, correlating with the findings seen preceding abdominal CTA. Celiac arteriogram demonstrates somewhat unconventional hepatic arterial anatomy with separate origins of the medial and lateral divisions of the left hepatic artery. The hypervascular mass within the dome of the right lobe of the liver is supplied predominantly via lateral division of the right hepatic artery, confirmed with sub selective arteriogram of the lateral division right artery. Under a combination of ultrasound and fluoroscopic guidance, a PR microwave ablation probe was appropriately positioned within hypervascular tumor. Following probe positioning, bland embolization was performed tumor via the microcatheter. Following bland embolization, a 10 minute ablation was performed at 65 watts under intermittent sonographic guidance which demonstrated the ablation zone encompassing entirety of hypoechoic mass. 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 PLAN: - The patient will be admitted overnight for continued observation and PCA usage. - Follow-up telemedicine consultation will be performed in approximately 3-4 weeks. At that time, a CMP level be obtained. Initial postprocedural MRI will be performed in 3 months (mid February 2018). Electronically Signed   By: Simonne Come M.D.   On: 07/21/2022  12:57   IR 3D Independent Annabell Sabal  Result Date: 07/21/2022 INDICATION: History of hepatocellular carcinoma. Patient presents today for mesenteric arteriogram, bland embolization and percutaneous ultrasound and fluoroscopic guided hepatic lesion microwave ablation. Please  refer to formal consultations dictated in the EMR (EPIC) on 06/03/2022 for additional details. EXAM: 1. ULTRASOUND GUIDANCE FOR ARTERIAL ACCESS 2. SELECTIVE CELIAC ARTERIOGRAM 3. SUB SELECTIVE ARTERIOGRAM AND FLUOROSCOPIC GUIDED BLAND EMBOLIZATION OF THE MEDIAL DIVISION OF THE RIGHT HEPATIC ARTERY 4. ULTRASOUND AND FLUOROSCOPIC GUIDED THERMAL ABLATION OF LIVER LESION ANESTHESIA/SEDATION: General MEDICATIONS: Rocephin 1 gm IV. The antibiotic was administered in an appropriate time interval prior to needle puncture of the skin. CONTRAST:  55 cc Omnipaque 300 COMPLICATIONS: None immediate. PROCEDURE: The procedure, risks, benefits, and alternatives were explained to the patient. Questions regarding the procedure were encouraged and answered. The patient understands and consents to the procedure. The patient was placed under general anesthesia. Sonographic evaluation performed of the right upper abdomen demonstrating the at least 3.2 cm hypoechoic mass within the superior aspect of the right lobe of the liver. The procedure was planned. Skin overlying the right groin as well as the right upper abdominal quadrant was prepped draped in sterile fashion. A time-out performed prior initiation of the procedure Initially, under direct ultrasound guidance, the right common femoral artery was accessed with a micropuncture kit allowing for placement of a 5 French vascular sheath. Closure arteriogram was performed via the side arm of the sheath. Next, over a Bentson wire, a Mickelson catheter was advanced to the level of the thoracic aorta where it was forearm, back blood flushed. Catheter was then utilized to select the celiac artery and a selective celiac  arteriogram was performed With the use of a fathom 14 microwire, a TriNav anti-reflux microcatheter was utilized to select the lateral division of the right hepatic artery. Sub selective arteriogram performed. Next, under direct ultrasound guidance, a Neuwave PR percutaneous microwave ablation probe was advanced into the cranial aspect of the hepatic lesion under direct ultrasound guidance. Ultrasound images were saved for procedural documentation purposes. Sub selective injection was then performed of the lateral division of the right hepatic artery microwave ablation probe was slightly repositioned under direct fluoroscopic guidance. At this time, bland embolization was performed of the lateral division right hepatic artery with approximately 1/3 vial of 100-300 micron Embospheres to vessel stasis. At this time, the microcatheter and Mickelson catheter were removed. Next, a 10-minute ablation was performed at 65 watts under intermittent sonographic guidance. While the microwave ablation was being performed, the vascular sheath removed and hemostasis was achieved at the right groin access site with manual compression. Following the 10 minute ablation, tract ablation was performed and microwave probe was removed intact. Hemostasis achieved with manual compression Dressings were applied at both locations the procedure was terminated. Patient tolerated the procedure well without immediate postprocedural complication. FINDINGS: Sonographic evaluation abdomen demonstrates an approximately 3.2 cm hypoechoic mass within the superior aspect of the right lobe of the liver, correlating with the findings seen preceding abdominal CTA. Celiac arteriogram demonstrates somewhat unconventional hepatic arterial anatomy with separate origins of the medial and lateral divisions of the left hepatic artery. The hypervascular mass within the dome of the right lobe of the liver is supplied predominantly via lateral division of the right  hepatic artery, confirmed with sub selective arteriogram of the lateral division right artery. Under a combination of ultrasound and fluoroscopic guidance, a PR microwave ablation probe was appropriately positioned within hypervascular tumor. Following probe positioning, bland embolization was performed tumor via the microcatheter. Following bland embolization, a 10 minute ablation was performed at 65 watts under intermittent sonographic guidance which demonstrated the ablation zone encompassing entirety of hypoechoic mass. IMPRESSION: Technically successful  bland embolization and ultrasound/fluoroscopic guided microwave ablation of solitary focus of hepatocellular carcinoma within the right lobe of the liver. PLAN: - The patient will be admitted overnight for continued observation and PCA usage. - Follow-up telemedicine consultation will be performed in approximately 3-4 weeks. At that time, a CMP level be obtained. Initial postprocedural MRI will be performed in 3 months (mid February 2018). Electronically Signed   By: Simonne Come M.D.   On: 07/21/2022 12:57   IR US Guide Vasc Access Right  Result Date: 07/21/2022 INDICATION: History of hepatocellular carcinoma. Patient presents today for mesenteric arteriogram, bland embolization and percutaneous ultrasound and fluoroscopic guided hepatic lesion microwave ablation. Please refer to formal consultations dictated in the EMR (EPIC) on 06/03/2022 for additional details. EXAM: 1. ULTRASOUND GUIDANCE FOR ARTERIAL ACCESS 2. SELECTIVE CELIAC ARTERIOGRAM 3. SUB SELECTIVE ARTERIOGRAM AND FLUOROSCOPIC GUIDED BLAND EMBOLIZATION OF THE MEDIAL DIVISION OF THE RIGHT HEPATIC ARTERY 4. ULTRASOUND AND FLUOROSCOPIC GUIDED THERMAL ABLATION OF LIVER LESION ANESTHESIA/SEDATION: General MEDICATIONS: Rocephin 1 gm IV. The antibiotic was administered in an appropriate time interval prior to needle puncture of the skin. CONTRAST:  55 cc Omnipaque 300 COMPLICATIONS: None immediate.  PROCEDURE: The procedure, risks, benefits, and alternatives were explained to the patient. Questions regarding the procedure were encouraged and answered. The patient understands and consents to the procedure. The patient was placed under general anesthesia. Sonographic evaluation performed of the right upper abdomen demonstrating the at least 3.2 cm hypoechoic mass within the superior aspect of the right lobe of the liver. The procedure was planned. Skin overlying the right groin as well as the right upper abdominal quadrant was prepped draped in sterile fashion. A time-out performed prior initiation of the procedure Initially, under direct ultrasound guidance, the right common femoral artery was accessed with a micropuncture kit allowing for placement of a 5 French vascular sheath. Closure arteriogram was performed via the side arm of the sheath. Next, over a Bentson wire, a Mickelson catheter was advanced to the level of the thoracic aorta where it was forearm, back blood flushed. Catheter was then utilized to select the celiac artery and a selective celiac arteriogram was performed With the use of a fathom 14 microwire, a TriNav anti-reflux microcatheter was utilized to select the lateral division of the right hepatic artery. Sub selective arteriogram performed. Next, under direct ultrasound guidance, a Neuwave PR percutaneous microwave ablation probe was advanced into the cranial aspect of the hepatic lesion under direct ultrasound guidance. Ultrasound images were saved for procedural documentation purposes. Sub selective injection was then performed of the lateral division of the right hepatic artery microwave ablation probe was slightly repositioned under direct fluoroscopic guidance. At this time, bland embolization was performed of the lateral division right hepatic artery with approximately 1/3 vial of 100-300 micron Embospheres to vessel stasis. At this time, the microcatheter and Mickelson catheter were  removed. Next, a 10-minute ablation was performed at 65 watts under intermittent sonographic guidance. While the microwave ablation was being performed, the vascular sheath removed and hemostasis was achieved at the right groin access site with manual compression. Following the 10 minute ablation, tract ablation was performed and microwave probe was removed intact. Hemostasis achieved with manual compression Dressings were applied at both locations the procedure was terminated. Patient tolerated the procedure well without immediate postprocedural complication. FINDINGS: Sonographic evaluation abdomen demonstrates an approximately 3.2 cm hypoechoic mass within the superior aspect of the right lobe of the liver, correlating with the findings seen preceding abdominal CTA. Celiac  arteriogram demonstrates somewhat unconventional hepatic arterial anatomy with separate origins of the medial and lateral divisions of the left hepatic artery. The hypervascular mass within the dome of the right lobe of the liver is supplied predominantly via lateral division of the right hepatic artery, confirmed with sub selective arteriogram of the lateral division right artery. Under a combination of ultrasound and fluoroscopic guidance, a PR microwave ablation probe was appropriately positioned within hypervascular tumor. Following probe positioning, bland embolization was performed tumor via the microcatheter. Following bland embolization, a 10 minute ablation was performed at 65 watts under intermittent sonographic guidance which demonstrated the ablation zone encompassing entirety of hypoechoic mass. 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 PLAN: - The patient will be admitted overnight for continued observation and PCA usage. - Follow-up telemedicine consultation will be performed in  approximately 3-4 weeks. At that time, a CMP level be obtained. Initial postprocedural MRI will be performed in 3 months (mid February 2018). Electronically Signed   By: Simonne Come M.D.   On: 07/21/2022 12:57   IR Angiogram Visceral Selective  Result Date: 07/21/2022 INDICATION: History of hepatocellular carcinoma. Patient presents today for mesenteric arteriogram, bland embolization and percutaneous ultrasound and fluoroscopic guided hepatic lesion microwave ablation. Please refer to formal consultations dictated in the EMR (EPIC) on 06/03/2022 for additional details. EXAM: 1. ULTRASOUND GUIDANCE FOR ARTERIAL ACCESS 2. SELECTIVE CELIAC ARTERIOGRAM 3. SUB SELECTIVE ARTERIOGRAM AND FLUOROSCOPIC GUIDED BLAND EMBOLIZATION OF THE MEDIAL DIVISION OF THE RIGHT HEPATIC ARTERY 4. ULTRASOUND AND FLUOROSCOPIC GUIDED THERMAL ABLATION OF LIVER LESION ANESTHESIA/SEDATION: General MEDICATIONS: Rocephin 1 gm IV. The antibiotic was administered in an appropriate time interval prior to needle puncture of the skin. CONTRAST:  55 cc Omnipaque 300 COMPLICATIONS: None immediate. PROCEDURE: The procedure, risks, benefits, and alternatives were explained to the patient. Questions regarding the procedure were encouraged and answered. The patient understands and consents to the procedure. The patient was placed under general anesthesia. Sonographic evaluation performed of the right upper abdomen demonstrating the at least 3.2 cm hypoechoic mass within the superior aspect of the right lobe of the liver. The procedure was planned. Skin overlying the right groin as well as the right upper abdominal quadrant was prepped draped in sterile fashion. A time-out performed prior initiation of the procedure Initially, under direct ultrasound guidance, the right common femoral artery was accessed with a micropuncture kit allowing for placement of a 5 French vascular sheath. Closure arteriogram was performed via the side arm of the sheath. Next,  over a Bentson wire, a Mickelson catheter was advanced to the level of the thoracic aorta where it was forearm, back blood flushed. Catheter was then utilized to select the celiac artery and a selective celiac arteriogram was performed With the use of a fathom 14 microwire, a TriNav anti-reflux microcatheter was utilized to select the lateral division of the right hepatic artery. Sub selective arteriogram performed. Next, under direct ultrasound guidance, a Neuwave PR percutaneous microwave ablation probe was advanced into the cranial aspect of the hepatic lesion under direct ultrasound guidance. Ultrasound images were saved for procedural documentation purposes. Sub selective injection was then performed of the lateral division of the right hepatic artery microwave ablation probe was slightly repositioned under direct fluoroscopic guidance. At this time, bland embolization was performed of the lateral division right hepatic artery with approximately 1/3 vial of 100-300 micron Embospheres to vessel stasis. At this time, the  microcatheter and Mickelson catheter were removed. Next, a 10-minute ablation was performed at 65 watts under intermittent sonographic guidance. While the microwave ablation was being performed, the vascular sheath removed and hemostasis was achieved at the right groin access site with manual compression. Following the 10 minute ablation, tract ablation was performed and microwave probe was removed intact. Hemostasis achieved with manual compression Dressings were applied at both locations the procedure was terminated. Patient tolerated the procedure well without immediate postprocedural complication. FINDINGS: Sonographic evaluation abdomen demonstrates an approximately 3.2 cm hypoechoic mass within the superior aspect of the right lobe of the liver, correlating with the findings seen preceding abdominal CTA. Celiac arteriogram demonstrates somewhat unconventional hepatic arterial anatomy with  separate origins of the medial and lateral divisions of the left hepatic artery. The hypervascular mass within the dome of the right lobe of the liver is supplied predominantly via lateral division of the right hepatic artery, confirmed with sub selective arteriogram of the lateral division right artery. Under a combination of ultrasound and fluoroscopic guidance, a PR microwave ablation probe was appropriately positioned within hypervascular tumor. Following probe positioning, bland embolization was performed tumor via the microcatheter. Following bland embolization, a 10 minute ablation was performed at 65 watts under intermittent sonographic guidance which demonstrated the ablation zone encompassing entirety of hypoechoic mass. 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 PLAN: - The patient will be admitted overnight for continued observation and PCA usage. - Follow-up telemedicine consultation will be performed in approximately 3-4 weeks. At that time, a CMP level be obtained. Initial postprocedural MRI will be performed in 3 months (mid February 2018). Electronically Signed   By: Simonne Come M.D.   On: 07/21/2022 12:57   IR Angiogram Selective Each Additional Vessel  Result Date: 07/21/2022 INDICATION: History of hepatocellular carcinoma. Patient presents today for mesenteric arteriogram, bland embolization and percutaneous ultrasound and fluoroscopic guided hepatic lesion microwave ablation. Please refer to formal consultations dictated in the EMR (EPIC) on 06/03/2022 for additional details. EXAM: 1. ULTRASOUND GUIDANCE FOR ARTERIAL ACCESS 2. SELECTIVE CELIAC ARTERIOGRAM 3. SUB SELECTIVE ARTERIOGRAM AND FLUOROSCOPIC GUIDED BLAND EMBOLIZATION OF THE MEDIAL DIVISION OF THE RIGHT HEPATIC ARTERY 4. ULTRASOUND AND FLUOROSCOPIC GUIDED THERMAL ABLATION OF LIVER LESION  ANESTHESIA/SEDATION: General MEDICATIONS: Rocephin 1 gm IV. The antibiotic was administered in an appropriate time interval prior to needle puncture of the skin. CONTRAST:  55 cc Omnipaque 300 COMPLICATIONS: None immediate. PROCEDURE: The procedure, risks, benefits, and alternatives were explained to the patient. Questions regarding the procedure were encouraged and answered. The patient understands and consents to the procedure. The patient was placed under general anesthesia. Sonographic evaluation performed of the right upper abdomen demonstrating the at least 3.2 cm hypoechoic mass within the superior aspect of the right lobe of the liver. The procedure was planned. Skin overlying the right groin as well as the right upper abdominal quadrant was prepped draped in sterile fashion. A time-out performed prior initiation of the procedure Initially, under direct ultrasound guidance, the right common femoral artery was accessed with a micropuncture kit allowing for placement of a 5 French vascular sheath. Closure arteriogram was performed via the side arm of the sheath. Next, over a Bentson wire, a Mickelson catheter was advanced to the level of the thoracic aorta where it was forearm, back blood flushed. Catheter was then utilized to select the celiac artery and a selective celiac arteriogram was performed With  the use of a fathom 14 microwire, a TriNav anti-reflux microcatheter was utilized to select the lateral division of the right hepatic artery. Sub selective arteriogram performed. Next, under direct ultrasound guidance, a Neuwave PR percutaneous microwave ablation probe was advanced into the cranial aspect of the hepatic lesion under direct ultrasound guidance. Ultrasound images were saved for procedural documentation purposes. Sub selective injection was then performed of the lateral division of the right hepatic artery microwave ablation probe was slightly repositioned under direct fluoroscopic guidance. At  this time, bland embolization was performed of the lateral division right hepatic artery with approximately 1/3 vial of 100-300 micron Embospheres to vessel stasis. At this time, the microcatheter and Mickelson catheter were removed. Next, a 10-minute ablation was performed at 65 watts under intermittent sonographic guidance. While the microwave ablation was being performed, the vascular sheath removed and hemostasis was achieved at the right groin access site with manual compression. Following the 10 minute ablation, tract ablation was performed and microwave probe was removed intact. Hemostasis achieved with manual compression Dressings were applied at both locations the procedure was terminated. Patient tolerated the procedure well without immediate postprocedural complication. FINDINGS: Sonographic evaluation abdomen demonstrates an approximately 3.2 cm hypoechoic mass within the superior aspect of the right lobe of the liver, correlating with the findings seen preceding abdominal CTA. Celiac arteriogram demonstrates somewhat unconventional hepatic arterial anatomy with separate origins of the medial and lateral divisions of the left hepatic artery. The hypervascular mass within the dome of the right lobe of the liver is supplied predominantly via lateral division of the right hepatic artery, confirmed with sub selective arteriogram of the lateral division right artery. Under a combination of ultrasound and fluoroscopic guidance, a PR microwave ablation probe was appropriately positioned within hypervascular tumor. Following probe positioning, bland embolization was performed tumor via the microcatheter. Following bland embolization, a 10 minute ablation was performed at 65 watts under intermittent sonographic guidance which demonstrated the ablation zone encompassing entirety of hypoechoic mass. IMPRESSION: Technically successful bland embolization and ultrasound/fluoroscopic guided microwave ablation of  solitary focus of hepatocellular carcinoma within the right lobe of the liver. PLAN: - The patient will be admitted overnight for continued observation and PCA usage. - Follow-up telemedicine consultation will be performed in approximately 3-4 weeks. At that time, a CMP level be obtained. Initial postprocedural MRI will be performed in 3 months (mid February 2018). Electronically Signed   By: Simonne Come M.D.   On: 07/21/2022 12:57    ASSESSMENT & PLAN [name] 72 y.o. female with medical history significant for newly diagnosed [facility] who presents for a follow up visit.   # Hepatocellular Carcinoma, Stage 1b -- Diagnosis confirmed based on the MRI imaging. --AFP, CEA, and CA 19-9 were WNL prior to intervention. No utility in monitoring these.  -- currently under evaluation for liver transplant by [facility] -- underwent embolization/microwave ablation on 07/21/2022.  -- Labs today show white blood cell count 4.1, hemoglobin 9.2, MCV 90.7, and platelets of 106.  Creatinine was 0.55 with normal LFTs. 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 -- Appears stable compared to prior, will continue to monitor.  Orders Placed This Encounter  Procedures   MR LIVER W WO CONTRAST    Standing Status:   Future    Standing Expiration Date:   08/06/2023    Order Specific Question:   If indicated for the ordered procedure, I authorize the administration  of contrast media per Radiology protocol    Answer:   Yes    Order Specific Question:   What is the patient's sedation requirement?    Answer:   No Sedation    Order Specific Question:   Does the patient have a pacemaker or implanted devices?    Answer:   No    Order Specific Question:   Preferred imaging location?    Answer:   [facility] (table limit - 550 lbs)    All questions were answered. The patient knows to call the clinic with any problems, questions or  concerns.  A total of more than 30 minutes were spent on this encounter with face-to-face time and non-face-to-face time, including preparing to see the patient, ordering tests and/or medications, counseling the patient and coordination of care as outlined above.   Ulysees Barns, MD Department of Hematology/Oncology [facility] at [facility] Phone: [phone] Pager: [phone] Email: [email or web address]  08/07/2022 12:15 PM

## 2022-08-31 IMAGING — CT CT CHEST W/O CM
2 of 5 series · 15 of 36 positions shown, 18 images · non-contrast
Comparison: [DATE] [DATE], [DATE].  [DATE] [DATE], [DATE].

CLINICAL DATA: Abnormal chest x-ray.

EXAM:
CT CHEST WITHOUT CONTRAST
TECHNIQUE: Multidetector CT imaging of the chest was performed following the
standard protocol without IV contrast.

[Series 4: chest 2.00 br40 s3 · coronal · 0.64mm/px · 3 of 166 slices shown]
[im 34/166  lung]
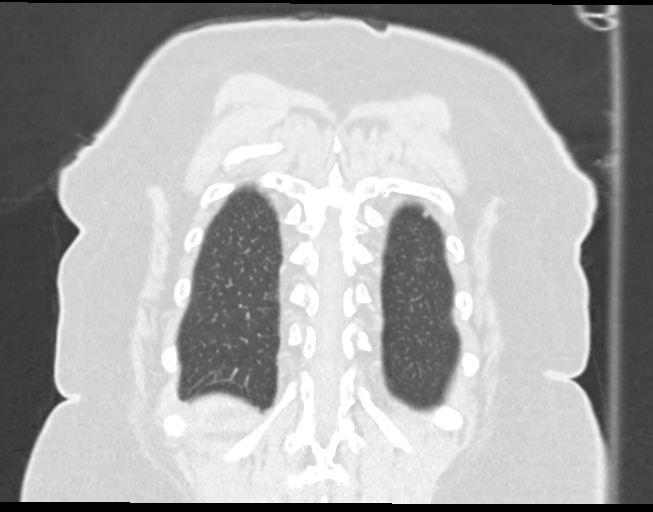
[im 67/166  lung]
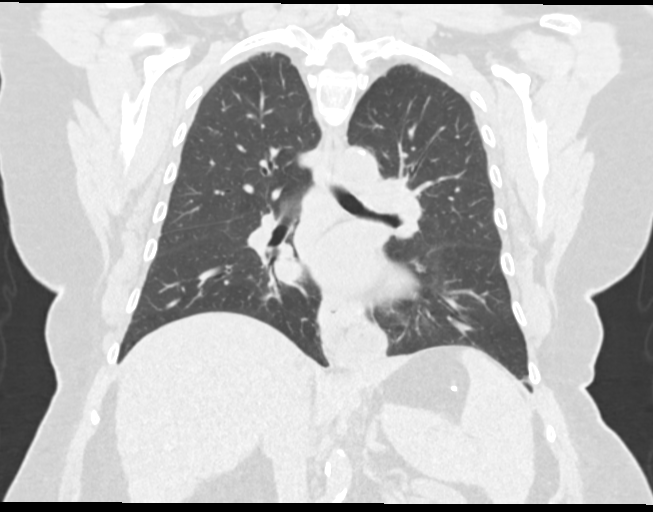
[im 100/166  lung]
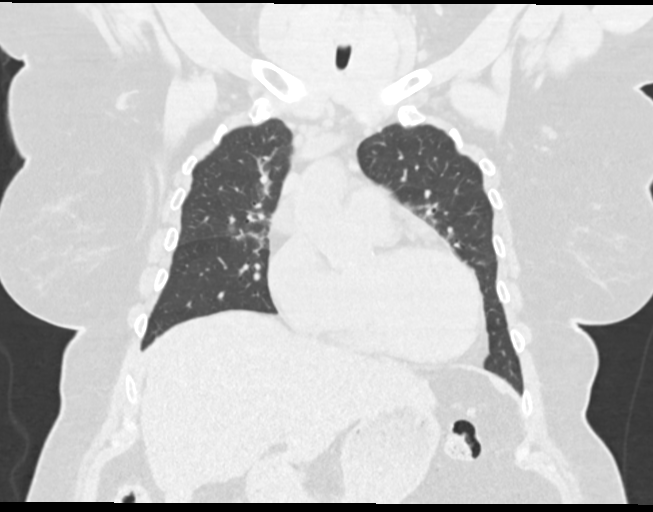

[Series 10: chest 1.00 br40 s3 super d · axial · 0.82mm/px · z∈[+1567,+1838]mm · 12 of 397 slices shown, 15 images]
[im 29/397  mediastinal]
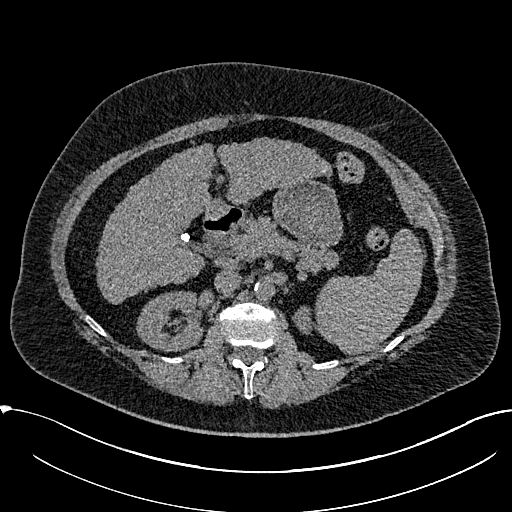
[im 29/397  lung]
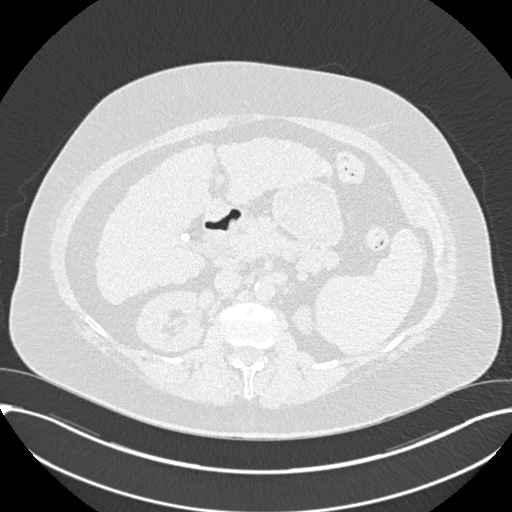
[im 57/397  lung]
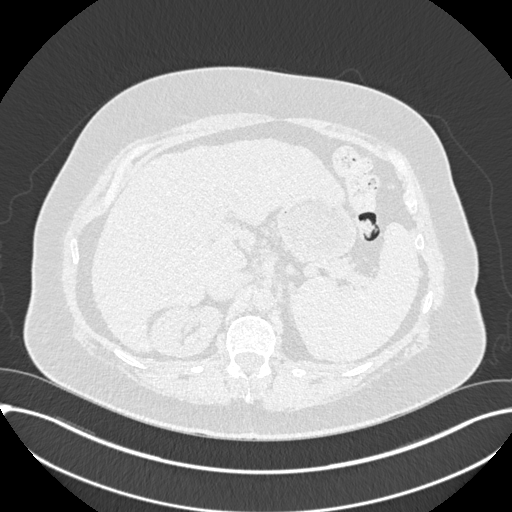
[im 85/397  lung]
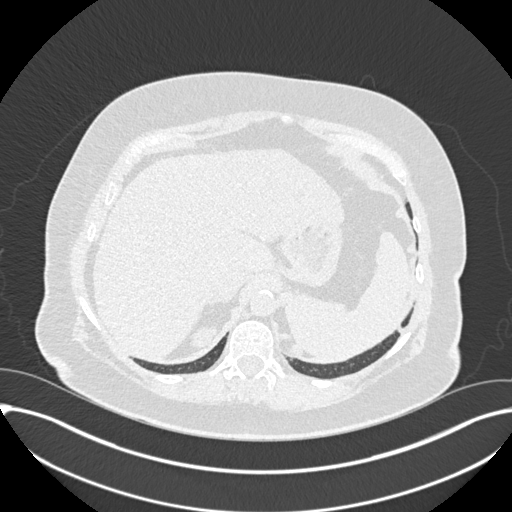
[im 114/397  lung]
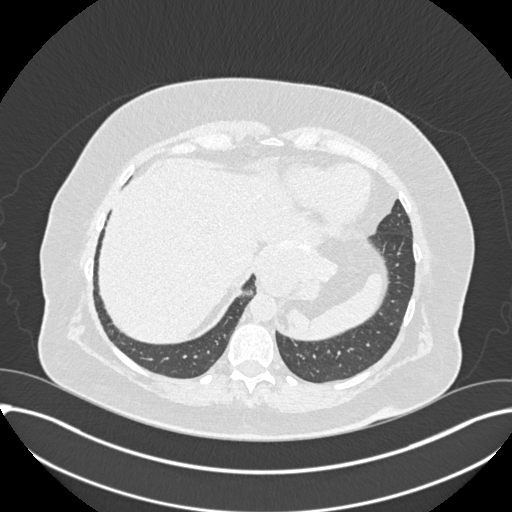
[im 142/397  mediastinal]
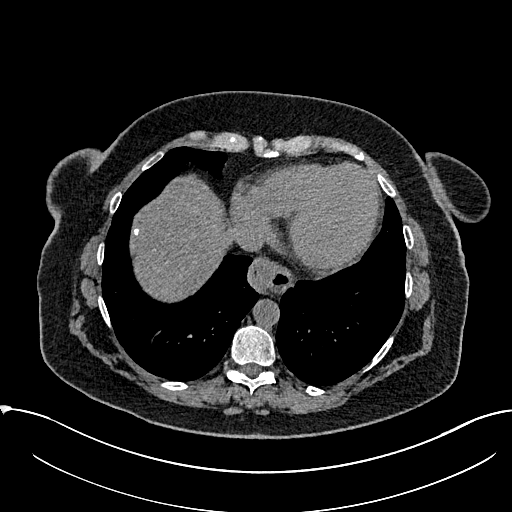
[im 142/397  lung]
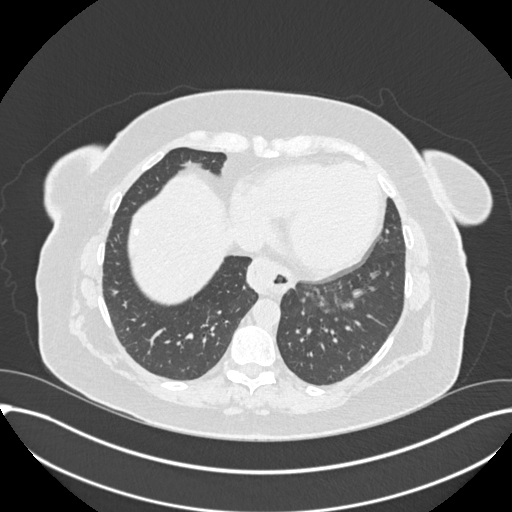
[im 170/397  lung]
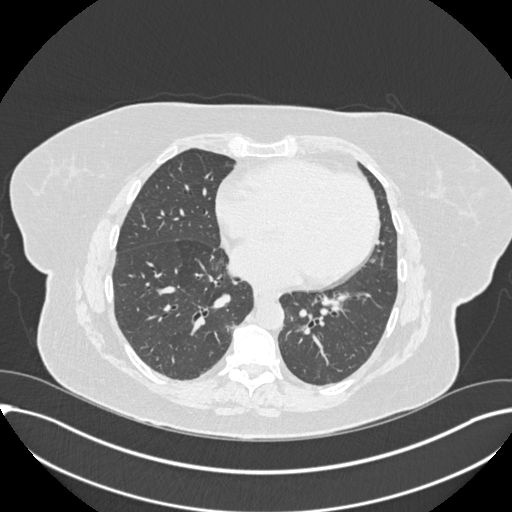
[im 227/397  lung]
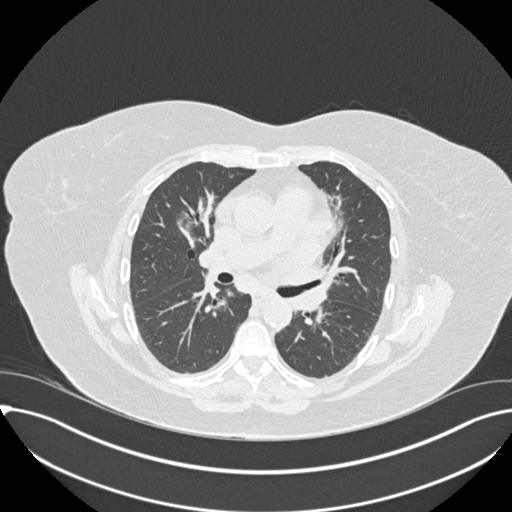
[im 255/397  lung]
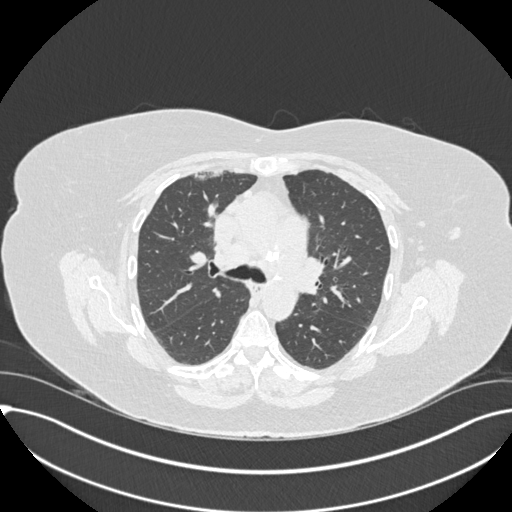
[im 283/397  mediastinal]
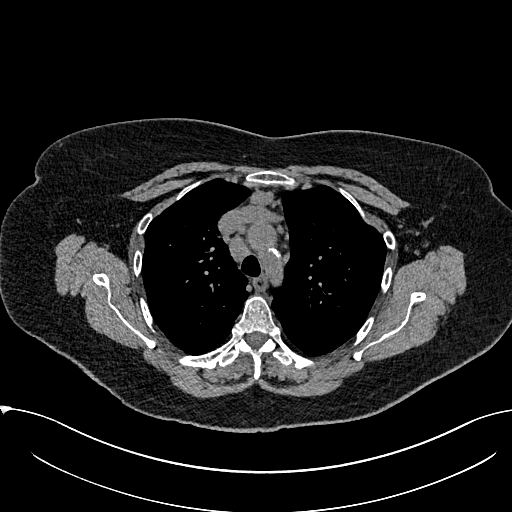
[im 283/397  lung]
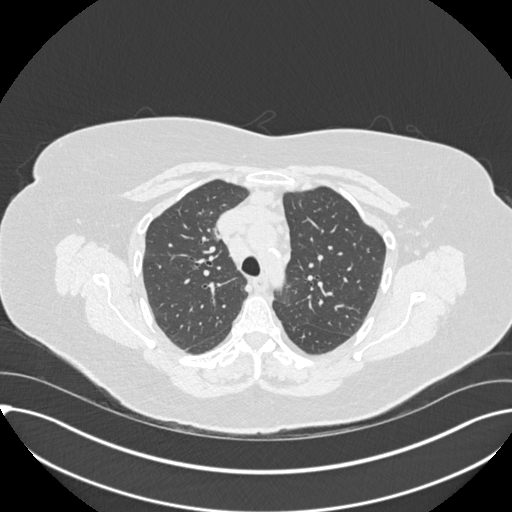
[im 312/397  lung]
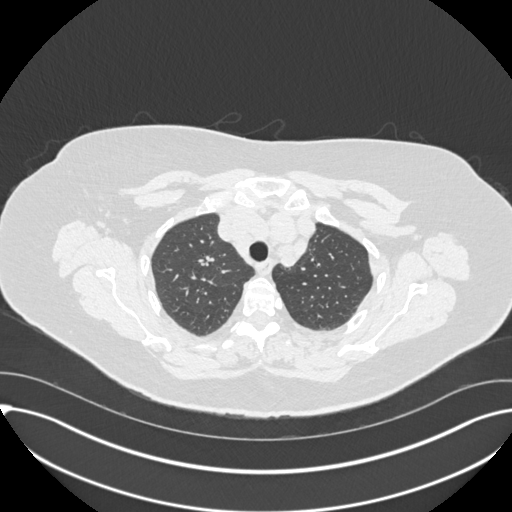
[im 340/397  lung]
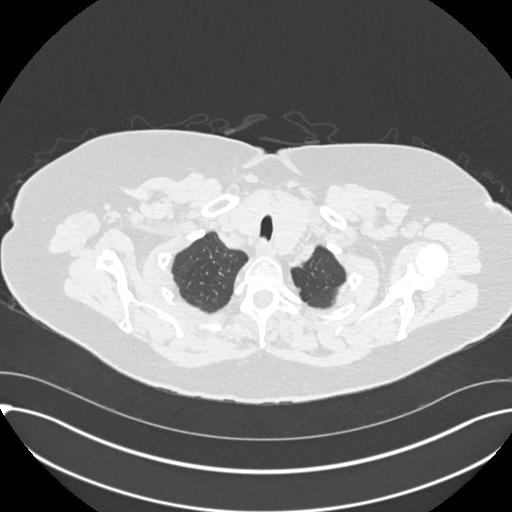
[im 368/397  lung]
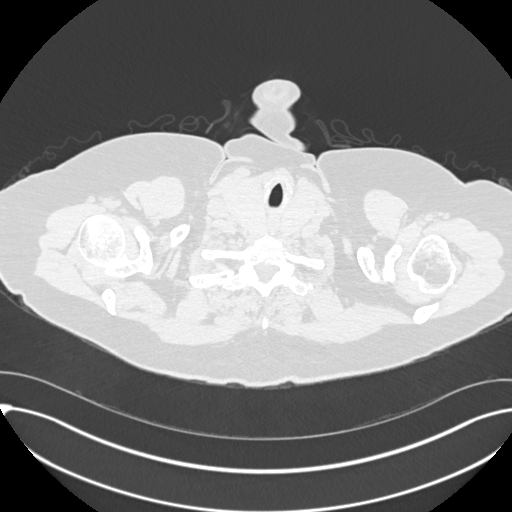

[15 of 36 positions shown; findings below may reference images not displayed]

FINDINGS: Cardiovascular: Atherosclerosis of thoracic aorta is noted without
aneurysm formation. Normal cardiac size. No pericardial effusion.

Mediastinum/Nodes: Diffusely enlarged thyroid gland is noted.
Moderate size sliding-type hiatal hernia is noted. Status post
fundoplication. 16 mm precarinal lymph node is noted. 12 mm right
paratracheal lymph node is noted. 13 mm subcarinal lymph node is
noted.

Lungs/Pleura: No pneumothorax or pleural effusion is noted. 28 x 18
mm ground-glass opacity is noted in the right upper lobe best seen
on image number 70 tubes is series 8. Another ground-glass opacity
is noted in the left upper lobe measuring 24 x 22 mm, best seen on
image number 66 of series 8. Minimal ground-glass opacity is noted
in left lower lobe.

Upper Abdomen: Hepatic cirrhosis is noted.

Musculoskeletal: No chest wall mass or suspicious bone lesions
identified.
IMPRESSION: Multiple ground-glass opacities are noted bilaterally, the largest
noted in right upper lobe. Potentially this may represent multifocal
inflammation, but neoplasm cannot be excluded. Non-contrast chest CT
at 3-6 months is recommended. If nodules persist, subsequent
management will be based upon the most suspicious nodule(s). This
recommendation follows the consensus statement: Guidelines for
Management of Incidental Pulmonary Nodules Detected on CT Images:

Enlarged mediastinal adenopathy is noted as well. It is uncertain if
this is inflammatory, reactive or neoplastic in etiology. Attention
on follow-up imaging is recommended.

Hepatic cirrhosis is noted.

Diffusely enlarged thyroid gland is noted. This has been evaluated
on previous imaging. (ref: [HOSPITAL]. [DATE]):

Moderate size sliding-type hiatal hernia.

Aortic Atherosclerosis (G6BIM-VLO.O).

## 2022-09-02 ENCOUNTER — Other Ambulatory Visit: Payer: Self-pay | Admitting: Nurse Practitioner

## 2022-09-02 DIAGNOSIS — C220 Liver cell carcinoma: Secondary | ICD-10-CM

## 2022-09-07 ENCOUNTER — Telehealth: Payer: Medicare Other

## 2022-09-07 DIAGNOSIS — C220 Liver cell carcinoma: Secondary | ICD-10-CM

## 2022-09-07 NOTE — Progress Notes (Signed)
Patient ID: [name], female   DOB: 1950/04/10, 72 y.o.   MRN: [id number]        Chief Complaint: Lifescape   Referring Physician(s): Drazek,Dawn   History of Present Illness: [name] is a 72 y.o. female with past medical history significant for pulmonary sarcoidosis, mild aortic stenosis, hypertension, hypothyroidism, COPD, asthma and CAD with known history of NASH cirrhosis who was found to have a worrisome enhancing liver lesion on chest CT performed 04/13/2022, performed for the evaluation of hemoptysis.  Subsequent abdominal MRI performed 04/22/2022 confirms the presence of an approximately 3.1 x 3.1 cm enhancing lesion within segment 8 of the right lobe of the liver, characterized as LI-RADS 5, definitive HCC.   The patient was then been evaluated by Dr. Freida Busman of the hepatobiliary surgical service and deemed a nonoperative candidate given her medical comorbidities and degree of cirrhosis. The patient was then evaluated by [name] from the hepatobiliary transplant team and the patient was referred for consideration of percutaneous management.   The patient was in interventional radiology clinic on 06/03/2022 and ultimately underwent percutaneous bland embolization and microwave ablation at [facility] on 07/21/2022.   While the procedure was technically successful, the procedure was complicated by development of a right groin access site pelvic hematoma necessitating a longer than expected admission to the hospital.  Patient was subsequently discharged on 07/24/2022.   Patient return to the emergency department on 07/27/2022 with recurrent abdominal pain however CT scan of the abdomen pelvis demonstrated improvement in known right pelvic hematoma and was without evidence additional complication.  Patient is seen today and telemedicine consultation for postprocedural evaluation and management.   The patient states she has now completely recovered from the procedure and has  returned to her functional baseline.  She reports good appetite and energy levels.  No abdominal pain, bloody or melanotic stools.  No fever or chills.  No yellowing of the skin or eyes.  No increased abdominal girth.    Past Medical History:  Diagnosis Date   Anxiety    Arthritis    Asthma    Blood dyscrasia    Thrombocytopenia   CAD (coronary artery disease)    CHF (congestive heart failure) (HCC)    Complication of anesthesia    trouble waking up once with Nissen Fundoplication   COPD (chronic obstructive pulmonary disease) (HCC)    Depression    Diabetes mellitus without complication (HCC)    Dysrhythmia    Heart murmur    hepatocellular ca    Hypertension    Hypothyroidism    Liver disease    NAFLD cirrhosis   Mild aortic stenosis    Pneumonia    Sarcoidosis    Sarcoidosis of lung (HCC)    Sarcoidosis of lymph nodes     Past Surgical History:  Procedure Laterality Date   ABDOMINAL HYSTERECTOMY     ABDOMINAL SURGERY     CHOLECYSTECTOMY     ESOPHAGOGASTRODUODENOSCOPY (EGD) WITH PROPOFOL N/A 04/17/2021   Procedure: ESOPHAGOGASTRODUODENOSCOPY (EGD) WITH PROPOFOL;  Surgeon: Jeani Hawking, MD;  Location: [facility] ENDOSCOPY;  Service: Gastroenterology;  Laterality: N/A;   HERNIA REPAIR     IR 3D INDEPENDENT WKST  07/21/2022   IR ANGIOGRAM SELECTIVE EACH ADDITIONAL VESSEL  07/21/2022   IR ANGIOGRAM VISCERAL SELECTIVE  07/21/2022   IR EMBO TUMOR ORGAN ISCHEMIA INFARCT INC GUIDE ROADMAPPING  07/21/2022   IR GUIDED LIVER TUMOR ABLATION RFA [facility]  07/21/2022   IR US GUIDE VASC ACCESS  RIGHT  07/21/2022   LASIK  2001   LEFT HEART CATH AND CORONARY ANGIOGRAPHY N/A 09/23/2020   Procedure: LEFT HEART CATH AND CORONARY ANGIOGRAPHY;  Surgeon: Lennette Bihari, MD;  Location: [facility] INVASIVE CV LAB;  Service: Cardiovascular;  Laterality: N/A;   nissenfundiplication     RADIOLOGY WITH ANESTHESIA N/A 07/21/2022   Procedure: IR WITH ANESTHESIA MICROWAVE ABLATION;  Surgeon: Simonne Come, MD;  Location: [facility] ORS;   Service: Anesthesiology;  Laterality: N/A;   TOTAL KNEE ARTHROPLASTY Right 05/19/2021   Procedure: TOTAL KNEE ARTHROPLASTY;  Surgeon: Durene Romans, MD;  Location: [facility] ORS;  Service: Orthopedics;  Laterality: Right;    Allergies: Sulfa antibiotics, Atorvastatin, Astelin [azelastine], and Amoxicillin  Medications: Prior to Admission medications   Medication Sig Start Date End Date Taking? Authorizing Provider  acetaminophen (TYLENOL) 325 MG tablet Take 650 mg by mouth every 6 (six) hours as needed for moderate pain.    [provider]  acyclovir (ZOVIRAX) 400 MG tablet Take 400 mg by mouth every 8 (eight) hours as needed (fever blisters). Fever blisters 05/19/20   [provider]  albuterol (VENTOLIN HFA) 108 (90 Base) MCG/ACT inhaler Inhale 2 puffs into the lungs every 6 (six) hours as needed. Patient taking differently: Inhale 2 puffs into the lungs every 6 (six) hours as needed for shortness of breath or wheezing. 04/08/22   Charlott Holler, MD  ALPRAZolam Prudy Feeler) 0.25 MG tablet Take 0.25 mg by mouth 3 (three) times daily as needed for anxiety. 04/27/22   [provider]  amLODipine (NORVASC) 10 MG tablet Take 1 tablet (10 mg total) by mouth daily. 11/04/21 10/30/22  Jodelle Red, MD  aspirin EC 81 MG tablet Take 81 mg by mouth daily. Swallow whole.    [provider]  Biotin 10 MG CAPS Take 10 mg by mouth daily.    [provider]  Calcium Carb-Cholecalciferol (CALCIUM 600 + D PO) Take 1 tablet by mouth daily.    [provider]  carvedilol (COREG) 25 MG tablet TAKE 1 TABLET BY MOUTH TWICE DAILY WITH A MEAL 05/06/22   Jodelle Red, MD  docusate sodium (COLACE) 100 MG capsule Take 1 capsule (100 mg total) by mouth 2 (two) times daily. Patient taking differently: Take 200 mg by mouth daily. 05/20/21   Cassandria Anger, PA-C  fluticasone (FLONASE) 50 MCG/ACT nasal spray Place 1 spray into both nostrils daily. Patient taking  differently: Place 1 spray into both nostrils at bedtime. 06/15/22   Charlott Holler, MD  furosemide (LASIX) 20 MG tablet Take 20-40 mg by mouth as directed. Take 1 tablet (20 mg) daily and Take 2 tablets (40 mg) Daily PRN for Swelling    [provider]  gabapentin (NEURONTIN) 300 MG capsule Take 300 mg by mouth 2 (two) times daily. 09/02/20   [provider]  irbesartan (AVAPRO) 150 MG tablet Take 1 tablet by mouth once daily 03/10/22   Jodelle Red, MD  levothyroxine (SYNTHROID, LEVOTHROID) 150 MCG tablet Take 75-150 mcg by mouth See admin instructions. Pt alternates taking 150 mcg one day and 75 mcg the next with before breakfast    [provider]  metFORMIN (GLUCOPHAGE-XR) 500 MG 24 hr tablet Take 2 tablets (1,000 mg total) by mouth 2 (two) times daily. Patient taking differently: Take 500 mg by mouth daily with breakfast. 09/25/20   Marinda Elk, MD  nitrofurantoin, macrocrystal-monohydrate, (MACROBID) 100 MG capsule Take 1 capsule (100 mg total) by mouth 2 (two) times daily.  X 7 days 07/27/22   Gilda Crease, MD  rosuvastatin (CRESTOR) 10 MG tablet Take 10 mg by mouth daily.    [provider]  Semaglutide,0.25 or 0.5MG /DOS, (OZEMPIC, 0.25 OR 0.5 MG/DOSE,) 2 MG/3ML SOPN Inject 0.5 mg into the skin once a week.    [provider]  spironolactone (ALDACTONE) 25 MG tablet Take 25 mg by mouth daily. 11/22/21   [provider]  Tetrahydrozoline-Zn Sulfate (ALLERGY RELIEF EYE DROPS OP) Place 1 drop into both eyes daily as needed (allergies).    [provider]  tiZANidine (ZANAFLEX) 4 MG tablet Take 4 mg by mouth every 8 (eight) hours as needed for muscle spasms. 02/10/22   [provider]  zolpidem (AMBIEN) 10 MG tablet Take 10 mg by mouth at bedtime.    [provider]     Family History  Problem Relation Age of Onset   Diabetes Mother    Heart attack Mother    Heart failure Father     Hypertension Father    Heart attack Father    Heart Problems Sister    Blindness Sister    Diabetes Sister    Hypertension Sister    Heart attack Brother    Heart attack Brother    Throat cancer Brother    Healthy Son    Rheum arthritis Daughter     Social History   Socioeconomic History   Marital status: Married    Spouse name: Not on file   Number of children: Not on file   Years of education: Not on file   Highest education level: Not on file  Occupational History   Not on file  Tobacco Use   Smoking status: Never    Passive exposure: Past (minimal)   Smokeless tobacco: Never  Vaping Use   Vaping status: Never Used  Substance and Sexual Activity   Alcohol use: No   Drug use: No   Sexual activity: Not Currently  Other Topics Concern   Not on file  Social History Narrative   Not on file   Social Determinants of Health   Financial Resource Strain: Not on file  Food Insecurity: No Food Insecurity (07/24/2022)   Hunger Vital Sign    Worried About Running Out of Food in the Last Year: Never true    Ran Out of Food in the Last Year: Never true  Transportation Needs: No Transportation Needs (07/24/2022)   PRAPARE - Administrator, Civil Service (Medical): No    Lack of Transportation (Non-Medical): No  Physical Activity: Not on file  Stress: Not on file  Social Connections: Not on file    ECOG Status: 1 - Symptomatic but completely ambulatory  Review of Systems  Review of Systems: A 12 point ROS discussed and pertinent positives are indicated in the HPI above.  All other systems are negative.   Physical Exam No direct physical exam was performed (except for noted visual exam findings with Video Visits).    Vital Signs: There were no vitals taken for this visit.  Imaging:   The following examinations were reviewed in detail:   CT abdomen and pelvis-06/16/2022; 07/22/2022; 07/27/2022  Intra procedural images during hepatic bland and  percutaneous microwave ablation performed 07/21/2022   Provided images demonstrate a technically excellent result with the ablation site encompassing the entirety of the hyperenhancing hepatic lesion.   Labs:  CBC: Recent Labs    07/22/22 0857 07/22/22 1409 07/23/22 1850 07/24/22 0413 07/24/22 0908 07/26/22  1956 08/06/22 1433  WBC 13.8*  --  8.2  --   --  14.3* 4.1  HGB 8.3*   < > 9.8* 9.7* 10.3* 11.1* 11.2*  HCT 25.5*   < > 30.0* 29.8* 31.4* 33.8* 34.3*  PLT 131*  --  80*  --   --  147* 106*   < > = values in this interval not displayed.    COAGS: Recent Labs    04/13/22 0948 06/30/22 0925  INR 1.1 1.0    BMP: Recent Labs    07/21/22 0700 07/23/22 0428 07/26/22 1956 08/06/22 1433  NA 135 135 136 139  K 4.3 4.9 4.4 4.3  CL 107 107 109 109  CO2 20* 22 20* 26  GLUCOSE 133* 133* 134* 84  BUN 32* 52* 19 13  CALCIUM 9.6 9.1 8.9 9.5  CREATININE 0.68 1.01* 0.89 0.55  GFRNONAA >60 59* >60 >60    LIVER FUNCTION TESTS: Recent Labs    07/21/22 0700 07/23/22 0428 07/26/22 1956 08/06/22 1433  BILITOT 0.5 0.3 1.9* 1.0  AST 49* 132* 46* 39  ALT 53* 141* 63* 32  ALKPHOS 113 80 97 98  PROT 7.5 6.0* 7.3 6.7  ALBUMIN 3.6 3.1* 3.5 3.4*    TUMOR MARKERS: Recent Labs    04/30/22 1231  CEA 1.21    Assessment and Plan:  [name] is a 72 y.o. female with past medical history significant for pulmonary sarcoidosis, mild aortic stenosis, hypertension, hypothyroidism, COPD, asthma and CAD with known history of NASH cirrhosis who is post bland and microwave ablation of solitary LI-RADS 5 lesion performed on 07/21/22.  While the procedure was technically successful, the procedure was complicated by development of a right groin access site pelvic hematoma necessitating a longer than expected admission to the hospital.  Patient was subsequently discharged on 07/24/2022.   The following examinations were reviewed in detail:   CT abdomen and pelvis-06/16/2022;  07/22/2022; 07/27/2022  Intra procedural images during hepatic bland and percutaneous microwave ablation performed 07/21/2022   Provided images demonstrate a technically excellent result with the ablation site encompassing the entirety of the hyperenhancing hepatic lesion.  The patient states she has now completely recovered from the procedure and has returned to her functional baseline.  As patient states she is able to tolerated abdominal MRIs well and had a pre procedural MRI performed on 04/22/22, we will plan on performing surveillance of not only the treated lesion but also to evaluate for new areas of HCC with q 3 months MRIs, the first to be obtained 3 months following the ablation.   The pt demonstrated excellent understanding of the above discussion.   PLAN: - Follow-up tele consult following acquisition of post procedural abdominal MRI 3 months following the intervention (early October 2024).   A copy of this report was sent to the requesting provider on this date.  Electronically Signed: Simonne Come 09/07/2022, 11:31 AM   I spent a total of 15 Minutes in remote  clinical consultation, greater than 50% of which was counseling/coordinating care for post bland embolization and microwave ablation of hepatic lesion.    Visit type: Audio only (telephone). Audio (no video) only due to patient's lack of internet/smartphone capability. Alternative for in-person consultation at [facility], [address] [place], [place], [place]. This visit type was conducted due to national recommendations for restrictions regarding the COVID-19 Pandemic (e.g. social distancing).  This format is felt to be most appropriate for this patient at this time.  All  issues noted in this document were discussed and addressed.

## 2022-09-15 ENCOUNTER — Ambulatory Visit
Admission: RE | Admit: 2022-09-15 | Discharge: 2022-09-15 | Disposition: A | Discharge status: Home / Self Care | Payer: Medicare Other | Source: Ambulatory Visit | Attending: Nurse Practitioner | Admitting: Nurse Practitioner

## 2022-09-15 DIAGNOSIS — C220 Liver cell carcinoma: Secondary | ICD-10-CM

## 2022-09-15 MED ORDER — IOPAMIDOL (ISOVUE-300) INJECTION 61%
100.0000 mL | Freq: Once | INTRAVENOUS | Status: AC | PRN
  Administered 2022-09-15: 100 mL via INTRAVENOUS

## 2022-09-16 ENCOUNTER — Other Ambulatory Visit (HOSPITAL_BASED_OUTPATIENT_CLINIC_OR_DEPARTMENT_OTHER): Payer: Self-pay | Admitting: Cardiology

## 2022-09-16 DIAGNOSIS — I10 Essential (primary) hypertension: Secondary | ICD-10-CM

## 2022-09-16 DIAGNOSIS — I5032 Chronic diastolic (congestive) heart failure: Secondary | ICD-10-CM

## 2022-09-16 NOTE — Telephone Encounter (Signed)
Rx request sent to pharmacy.  

## 2022-10-05 ENCOUNTER — Other Ambulatory Visit: Payer: Self-pay | Admitting: Hematology and Oncology

## 2022-10-21 ENCOUNTER — Encounter (HOSPITAL_COMMUNITY): Payer: Self-pay

## 2022-10-21 ENCOUNTER — Observation Stay (HOSPITAL_COMMUNITY)
Admission: EM | Admit: 2022-10-21 | Discharge: 2022-10-22 | Disposition: A | Discharge status: Home / Self Care | Payer: Medicare Other | Attending: Internal Medicine | Admitting: Internal Medicine

## 2022-10-21 ENCOUNTER — Other Ambulatory Visit: Payer: Self-pay

## 2022-10-21 ENCOUNTER — Observation Stay (HOSPITAL_COMMUNITY): Payer: Medicare Other

## 2022-10-21 ENCOUNTER — Emergency Department (HOSPITAL_COMMUNITY): Payer: Medicare Other

## 2022-10-21 DIAGNOSIS — Z7985 Long-term (current) use of injectable non-insulin antidiabetic drugs: Secondary | ICD-10-CM | POA: Diagnosis not present

## 2022-10-21 DIAGNOSIS — J4489 Other specified chronic obstructive pulmonary disease: Secondary | ICD-10-CM | POA: Insufficient documentation

## 2022-10-21 DIAGNOSIS — Z7984 Long term (current) use of oral hypoglycemic drugs: Secondary | ICD-10-CM | POA: Diagnosis not present

## 2022-10-21 DIAGNOSIS — Z79899 Other long term (current) drug therapy: Secondary | ICD-10-CM | POA: Diagnosis not present

## 2022-10-21 DIAGNOSIS — C220 Liver cell carcinoma: Secondary | ICD-10-CM | POA: Diagnosis not present

## 2022-10-21 DIAGNOSIS — I10 Essential (primary) hypertension: Secondary | ICD-10-CM | POA: Diagnosis present

## 2022-10-21 DIAGNOSIS — R079 Chest pain, unspecified: Secondary | ICD-10-CM | POA: Diagnosis not present

## 2022-10-21 DIAGNOSIS — E039 Hypothyroidism, unspecified: Secondary | ICD-10-CM | POA: Diagnosis present

## 2022-10-21 DIAGNOSIS — R0789 Other chest pain: Secondary | ICD-10-CM | POA: Diagnosis present

## 2022-10-21 DIAGNOSIS — Z7982 Long term (current) use of aspirin: Secondary | ICD-10-CM | POA: Diagnosis not present

## 2022-10-21 DIAGNOSIS — Z96651 Presence of right artificial knee joint: Secondary | ICD-10-CM | POA: Insufficient documentation

## 2022-10-21 DIAGNOSIS — D696 Thrombocytopenia, unspecified: Secondary | ICD-10-CM | POA: Diagnosis present

## 2022-10-21 DIAGNOSIS — Z7722 Contact with and (suspected) exposure to environmental tobacco smoke (acute) (chronic): Secondary | ICD-10-CM | POA: Insufficient documentation

## 2022-10-21 DIAGNOSIS — Z8505 Personal history of malignant neoplasm of liver: Secondary | ICD-10-CM | POA: Insufficient documentation

## 2022-10-21 DIAGNOSIS — I110 Hypertensive heart disease with heart failure: Secondary | ICD-10-CM | POA: Insufficient documentation

## 2022-10-21 DIAGNOSIS — I5032 Chronic diastolic (congestive) heart failure: Secondary | ICD-10-CM

## 2022-10-21 DIAGNOSIS — E119 Type 2 diabetes mellitus without complications: Secondary | ICD-10-CM | POA: Diagnosis not present

## 2022-10-21 DIAGNOSIS — I2510 Atherosclerotic heart disease of native coronary artery without angina pectoris: Secondary | ICD-10-CM | POA: Insufficient documentation

## 2022-10-21 DIAGNOSIS — K7581 Nonalcoholic steatohepatitis (NASH): Secondary | ICD-10-CM

## 2022-10-21 DIAGNOSIS — K7460 Unspecified cirrhosis of liver: Secondary | ICD-10-CM | POA: Insufficient documentation

## 2022-10-21 LAB — CBC
HCT: 40.9 % (ref 36.0–46.0)
Hemoglobin: 13.2 g/dL (ref 12.0–15.0)
MCH: 28.9 pg (ref 26.0–34.0)
MCHC: 32.3 g/dL (ref 30.0–36.0)
MCV: 89.5 fL (ref 80.0–100.0)
Platelets: 112 10*3/uL — ABNORMAL LOW (ref 150–400)
RBC: 4.57 MIL/uL (ref 3.87–5.11)
RDW: 12.8 % (ref 11.5–15.5)
WBC: 8 10*3/uL (ref 4.0–10.5)
nRBC: 0 % (ref 0.0–0.2)

## 2022-10-21 LAB — BASIC METABOLIC PANEL
Anion gap: 11 (ref 5–15)
BUN: 32 mg/dL — ABNORMAL HIGH (ref 8–23)
CO2: 22 mmol/L (ref 22–32)
Calcium: 9.7 mg/dL (ref 8.9–10.3)
Chloride: 101 mmol/L (ref 98–111)
Creatinine, Ser: 0.73 mg/dL (ref 0.44–1.00)
GFR, Estimated: >60 mL/min (ref >60)
Glucose, Bld: 128 mg/dL — ABNORMAL HIGH (ref 70–99)
Potassium: 4.3 mmol/L (ref 3.5–5.1)
Sodium: 134 mmol/L — ABNORMAL LOW (ref 135–145)

## 2022-10-21 LAB — TROPONIN I (HIGH SENSITIVITY)
Troponin I (High Sensitivity): 10 ng/L (ref <18)
Troponin I (High Sensitivity): 9 ng/L (ref <18)
Troponin I (High Sensitivity): 5 ng/L (ref <18)

## 2022-10-21 LAB — BRAIN NATRIURETIC PEPTIDE: B Natriuretic Peptide: 77.3 pg/mL (ref 0.0–100.0)

## 2022-10-21 LAB — HEPATIC FUNCTION PANEL
ALT: 55 U/L — ABNORMAL HIGH (ref 0–44)
AST: 52 U/L — ABNORMAL HIGH (ref 15–41)
Albumin: 3.7 g/dL (ref 3.5–5.0)
Alkaline Phosphatase: 118 U/L (ref 38–126)
Bilirubin, Direct: 0.1 mg/dL (ref 0.0–0.2)
Indirect Bilirubin: 0.5 mg/dL (ref 0.3–0.9)
Total Bilirubin: 0.6 mg/dL (ref 0.3–1.2)
Total Protein: 7.7 g/dL (ref 6.5–8.1)

## 2022-10-21 LAB — MAGNESIUM: Magnesium: 2 mg/dL (ref 1.7–2.4)

## 2022-10-21 LAB — LIPASE, BLOOD: Lipase: 41 U/L (ref 11–51)

## 2022-10-21 MED ORDER — NITROGLYCERIN 0.4 MG SL SUBL: 0.4000 mg | SUBLINGUAL_TABLET | SUBLINGUAL | Status: DC | PRN

## 2022-10-21 MED ORDER — ENOXAPARIN SODIUM 40 MG/0.4ML IJ SOSY
40.0000 mg | PREFILLED_SYRINGE | INTRAMUSCULAR | Status: DC
  Administered 2022-10-22: 40 mg via SUBCUTANEOUS
  Filled 2022-10-21: qty 0.4

## 2022-10-21 MED ORDER — LEVOTHYROXINE SODIUM 50 MCG PO TABS: 75.0000 ug | ORAL_TABLET | ORAL | Status: DC

## 2022-10-21 MED ORDER — ASPIRIN 81 MG PO TBEC
81.0000 mg | DELAYED_RELEASE_TABLET | Freq: Every day | ORAL | Status: DC
  Administered 2022-10-22: 81 mg via ORAL
  Filled 2022-10-21: qty 1

## 2022-10-21 MED ORDER — CARVEDILOL 12.5 MG PO TABS
25.0000 mg | ORAL_TABLET | Freq: Two times a day (BID) | ORAL | Status: DC
  Administered 2022-10-22 (×2): 25 mg via ORAL
  Filled 2022-10-21 (×2): qty 2

## 2022-10-21 MED ORDER — SPIRONOLACTONE 25 MG PO TABS
25.0000 mg | ORAL_TABLET | Freq: Every day | ORAL | Status: DC
  Administered 2022-10-22: 25 mg via ORAL
  Filled 2022-10-21 (×2): qty 1

## 2022-10-21 MED ORDER — LEVOTHYROXINE SODIUM 25 MCG PO TABS
150.0000 ug | ORAL_TABLET | ORAL | Status: DC
  Administered 2022-10-22: 150 ug via ORAL
  Filled 2022-10-21: qty 6

## 2022-10-21 MED ORDER — FUROSEMIDE 40 MG PO TABS
20.0000 mg | ORAL_TABLET | Freq: Every day | ORAL | Status: DC
  Administered 2022-10-22: 20 mg via ORAL
  Filled 2022-10-21: qty 1

## 2022-10-21 MED ORDER — ACETAMINOPHEN 325 MG PO TABS
650.0000 mg | ORAL_TABLET | Freq: Four times a day (QID) | ORAL | Status: DC | PRN
  Administered 2022-10-22: 650 mg via ORAL
  Filled 2022-10-21: qty 2

## 2022-10-21 MED ORDER — GABAPENTIN 300 MG PO CAPS
300.0000 mg | ORAL_CAPSULE | Freq: Two times a day (BID) | ORAL | Status: DC
  Administered 2022-10-21 – 2022-10-22 (×2): 300 mg via ORAL
  Filled 2022-10-21 (×2): qty 1

## 2022-10-21 MED ORDER — IRBESARTAN 150 MG PO TABS
150.0000 mg | ORAL_TABLET | Freq: Every day | ORAL | Status: DC
  Administered 2022-10-22: 150 mg via ORAL
  Filled 2022-10-21 (×2): qty 1

## 2022-10-21 MED ORDER — ONDANSETRON HCL 4 MG/2ML IJ SOLN: 4.0000 mg | Freq: Four times a day (QID) | INTRAMUSCULAR | Status: DC | PRN

## 2022-10-21 MED ORDER — FLUTICASONE PROPIONATE 50 MCG/ACT NA SUSP
1.0000 | Freq: Every day | NASAL | Status: DC
  Filled 2022-10-21: qty 16

## 2022-10-21 MED ORDER — ROSUVASTATIN CALCIUM 20 MG PO TABS
10.0000 mg | ORAL_TABLET | Freq: Every day | ORAL | Status: DC
  Administered 2022-10-22: 10 mg via ORAL
  Filled 2022-10-21: qty 1

## 2022-10-21 MED ORDER — INSULIN ASPART 100 UNIT/ML IJ SOLN
0.0000 [IU] | Freq: Three times a day (TID) | INTRAMUSCULAR | Status: DC
  Filled 2022-10-21: qty 0.06

## 2022-10-21 MED ORDER — ZOLPIDEM TARTRATE 5 MG PO TABS
5.0000 mg | ORAL_TABLET | Freq: Every day | ORAL | Status: DC
  Administered 2022-10-21: 5 mg via ORAL
  Filled 2022-10-21: qty 1

## 2022-10-21 MED ORDER — ALPRAZOLAM 0.25 MG PO TABS: 0.2500 mg | ORAL_TABLET | Freq: Three times a day (TID) | ORAL | Status: DC | PRN

## 2022-10-21 MED ORDER — AMLODIPINE BESYLATE 5 MG PO TABS
10.0000 mg | ORAL_TABLET | Freq: Every day | ORAL | Status: DC
  Filled 2022-10-21: qty 2

## 2022-10-21 MED ORDER — NITROGLYCERIN 0.4 MG SL SUBL
0.4000 mg | SUBLINGUAL_TABLET | SUBLINGUAL | Status: DC | PRN
  Administered 2022-10-21: 0.4 mg via SUBLINGUAL
  Filled 2022-10-21: qty 1

## 2022-10-21 MED ORDER — ASPIRIN 325 MG PO TABS
325.0000 mg | ORAL_TABLET | Freq: Every day | ORAL | Status: DC
  Administered 2022-10-21: 325 mg via ORAL
  Filled 2022-10-21: qty 1

## 2022-10-21 MED ORDER — IOHEXOL 350 MG/ML SOLN
80.0000 mL | Freq: Once | INTRAVENOUS | Status: AC | PRN
  Administered 2022-10-21: 80 mL via INTRAVENOUS

## 2022-10-21 MED ORDER — DOCUSATE SODIUM 100 MG PO CAPS
200.0000 mg | ORAL_CAPSULE | Freq: Every day | ORAL | Status: DC
  Administered 2022-10-22: 200 mg via ORAL
  Filled 2022-10-21: qty 2

## 2022-10-21 MED ORDER — FUROSEMIDE 10 MG/ML IJ SOLN
20.0000 mg | Freq: Once | INTRAMUSCULAR | Status: AC
  Administered 2022-10-21: 20 mg via INTRAVENOUS
  Filled 2022-10-21: qty 4

## 2022-10-21 MED ORDER — ALBUTEROL SULFATE (2.5 MG/3ML) 0.083% IN NEBU: 3.0000 mL | INHALATION_SOLUTION | Freq: Four times a day (QID) | RESPIRATORY_TRACT | Status: DC | PRN

## 2022-10-21 NOTE — Assessment & Plan Note (Signed)
-  had LE edema on exam which is chronic for her. Continue daily home Lasix.

## 2022-10-21 NOTE — ED Triage Notes (Signed)
Pt complaining of chest pain that radiates to her back and neck. States the pain began around 30, pt has tried tums, GI cocktail, and a muscle relaxer without relief. Pt also endorses nausea.

## 2022-10-21 NOTE — ED Provider Notes (Signed)
[place] EMERGENCY DEPARTMENT AT [facility] Provider Note   CSN: [id number] Arrival date & time: 10/21/22  1502     History Chief Complaint  Patient presents with   Chest Pain    HPI [name] is a 72 y.o. female with Hx of CHF, COPD, CAD, mitral valve prolapse, sarcoidosis and hypothyroidism presenting for evaluation of chest pain that is stabbing in nature and radiates to her back and left arm. It started about 12pm today and has not stopped. She is unable to lay down due to the pain. Has some nausea, denies vomiting or abdominal pain. Denies coughing, SOB, fevers, chills, recent illness. She has never smoked, used alcohol or illicit drugs.   Past Medical History:  Diagnosis Date   Anxiety    Arthritis    Asthma    Blood dyscrasia    Thrombocytopenia   CAD (coronary artery disease)    CHF (congestive heart failure) (HCC)    Complication of anesthesia    trouble waking up once with Nissen Fundoplication   COPD (chronic obstructive pulmonary disease) (HCC)    Depression    Diabetes mellitus without complication (HCC)    Dysrhythmia    Heart murmur    hepatocellular ca    Hypertension    Hypothyroidism    Liver disease    NAFLD cirrhosis   Mild aortic stenosis    Pneumonia    Sarcoidosis    Sarcoidosis of lung (HCC)    Sarcoidosis of lymph nodes    Patient's recorded medical, surgical, social, medication list and allergies were reviewed in the Snapshot window as part of the initial history.   Review of Systems   Stated in HPI  Physical Exam Updated Vital Signs BP (!) 152/61   Pulse 87   Temp 98.9 F (37.2 C) (Oral)   Resp (!) 23   Ht 5\' 2"  (1.575 m)   Wt 80.7 kg   SpO2 96%   BMI 32.56 kg/m  Physical Exam Constitutional:      General: She is not in acute distress.    Appearance: She is not ill-appearing, toxic-appearing or diaphoretic.  Neck:     Vascular: JVD present. No hepatojugular reflux.  Cardiovascular:     Rate and Rhythm:  Normal rate and regular rhythm.     Heart sounds: No murmur heard.    No systolic murmur is present.     No friction rub. No gallop.  Pulmonary:     Effort: No tachypnea, accessory muscle usage or respiratory distress.     Breath sounds: Examination of the left-lower field reveals rales. Decreased breath sounds and rales present. No wheezing or rhonchi.  Abdominal:     Palpations: Abdomen is soft. There is hepatomegaly. There is no fluid wave or splenomegaly.  Musculoskeletal:     Right lower leg: Edema present.     Left lower leg: Edema present.  Skin:    General: Skin is warm and dry.     Capillary Refill: Capillary refill takes less than 2 seconds.  Neurological:     Mental Status: She is alert.   ED Course/ Medical Decision Making/ A&P Clinical Course as of 10/21/22 2232  Thu Oct 21, 2022  1635 Stable (Resident Case) 21 YOM with a chief complaint of chest pain.  Atypical and positional.   [CC]  1658 Reviewed at bedside.  Agree with plan of care per resident.  Atypical chest pain, 5-6 at peak now down to 3-4 after nitro.  Occurred at rest.  History of hypertension hyperlipidemia, diabetes and family history of heart disease.  Patient does not smoke.  Follow-up with cardiology for mitral valve regurgitation.  Last stress test was a cardiac morphology study in 2022.  Patient does not frequently have chest pain.  This is atypical for patient. [CC]  1844 Troponin I (High Sensitivity) [CA]  2046 Pain ongoing, heart score 6.  Has not been recently evaluated for cardiac pathology. [CC]    Clinical Course User Index [CA] Hassan Rowan, [place], MD [CC] Glyn Ade, MD   Medications Ordered in ED Medications  nitroGLYCERIN (NITROSTAT) SL tablet 0.4 mg (0.4 mg Sublingual Given 10/21/22 1611)  acetaminophen (TYLENOL) tablet 650 mg (has no administration in time range)  aspirin EC tablet 81 mg (has no administration in time range)  amLODipine (NORVASC) tablet 10 mg (has no  administration in time range)  carvedilol (COREG) tablet 25 mg (has no administration in time range)  furosemide (LASIX) tablet 20 mg (has no administration in time range)  irbesartan (AVAPRO) tablet 150 mg (has no administration in time range)  rosuvastatin (CRESTOR) tablet 10 mg (has no administration in time range)  spironolactone (ALDACTONE) tablet 25 mg (has no administration in time range)  ALPRAZolam (XANAX) tablet 0.25 mg (has no administration in time range)  zolpidem (AMBIEN) tablet 5 mg (has no administration in time range)  levothyroxine (SYNTHROID) tablet 75-150 mcg (has no administration in time range)  docusate sodium (COLACE) capsule 200 mg (has no administration in time range)  gabapentin (NEURONTIN) capsule 300 mg (has no administration in time range)  fluticasone (FLONASE) 50 MCG/ACT nasal spray 1 spray (has no administration in time range)  albuterol (PROVENTIL) (2.5 MG/3ML) 0.083% nebulizer solution 3 mL (has no administration in time range)  ondansetron (ZOFRAN) injection 4 mg (has no administration in time range)  enoxaparin (LOVENOX) injection 40 mg (has no administration in time range)  iohexol (OMNIPAQUE) 350 MG/ML injection 80 mL (has no administration in time range)  insulin aspart (novoLOG) injection 0-6 Units (has no administration in time range)  furosemide (LASIX) injection 20 mg (20 mg Intravenous Given 10/21/22 2120)   Medical Decision Making:    [name] is a 72 y.o. female who presented to the ED today with atypical chest pain that is relieved when leaning forward as detailed above.     Complete initial physical exam performed, notably the patient had decreased air movement, rales on the left lower lobe, a JVD, and BLE edema.       Reviewed and confirmed nursing documentation for past medical history, family history, social history.    Initial Assessment:   With the patient's presentation, most likely diagnosis is CHF exacerbation. Other diagnoses  were considered including (but not limited to) NSTEMI, COPD exacerbation, viral illness These are considered less likely due to history of present illness and physical exam findings.     Initial Plan:  Screening labs including CBC and Metabolic panel to evaluate for infectious or metabolic etiology of disease.  CXR to evaluate for structural/infectious intrathoracic pathology.  EKG to evaluate for cardiac pathology. Troponins due to atypical chest pain.  BNP as she appears volume overloaded  Objective evaluation as below reviewed with plan for close reassessment  Initial Study Results:   Laboratory  All laboratory results reviewed without evidence of clinically relevant pathology.    EKG EKG was reviewed independently. Rate, rhythm, axis, intervals all examined and without medically relevant abnormality. ST segments without concerns for elevations.  Radiology  All images reviewed independently. There are some ground glass opacities, interstitial infiltrates, blunting of the left costophrenic angle (mild) and hilar LAD. Agree with radiology report at this time as well.  Enlarged cardiopericardial silhouette.  Calcified aorta.    Linear opacity left mid to lower lung, likely scar or atelectasis.  Recommend follow-up   Consults:  Case discussed with Cardiology.  Reassessment and Plan:   Pt with hx of CHF, Mitral valve prolapse, CAD, HTN, T2DM, Sarcoidosis presents with atypical chest pain that radiates to arm, jaw, and back. Finds some relief with nitroglycerin. Heart score of 6. High risk. Troponins still WNL. Chest pain still present since 12pm today. Consulted with Cardiology who recommended admission and  Chest PET in the AM. Pt will be admitted for observation.   Clinical Impression:  1. Atypical chest pain      Admit   Final Clinical Impression(s) / ED Diagnoses Final diagnoses:  Atypical chest pain    Rx / DC Orders ED Discharge Orders     None          Hassan Rowan, [place], MD 10/21/22 2232    Glyn Ade, MD 10/26/22 1254

## 2022-10-21 NOTE — Assessment & Plan Note (Addendum)
-   Elevated likely secondary to chest pain -continue amlodipine, Coreg , irbesartan, Lasix, spironolactone

## 2022-10-21 NOTE — Assessment & Plan Note (Signed)
-   Stable and Chronic secondary to cirrhosis

## 2022-10-21 NOTE — ED Notes (Signed)
Report received from previous RN. This RN assumes care of the patient.

## 2022-10-21 NOTE — Assessment & Plan Note (Signed)
Continue levothyroxine 

## 2022-10-21 NOTE — ED Notes (Addendum)
7:46 PM Patient reports midsternal CP radiating to left shoulder and left upper back x noon. Patient has hx of heart murmur and regurgitation. Currently rating CP 2/10 on pain scale. Did endorse having SOB earlier, but reports that symptoms of resolved. Denies nausea, vomiting, dizziness, lightheadedness, fevers, chills, HA, palpitations. She is alert and oriented x 4. She has equal rise and fall of the chest wall with clear lung sounds. She currently denies any needs at this time. Call light in reach. Bed in lowest position. Pending repeat troponin results. Family present at bedside.    9:14 PM  Patient assisted to the restroom. Patient medicated per order. Patient informed of pending admission to hospital. Patient and family voice understanding. Patient denies any further needs at this time. Call light in reach. Bed in lowest position. Pending admission to hospital.   11:10 PM  Patient medicated per order. Patient assisted to the restroom. Patient given drinks per request. Patient denies any further needs at this time. Call light in reach. Bed in lowest position. Pending admission bed.   12:27 AM  Patient assisted to the restroom. Patient in NAD. She denies any needs at this time. Call light in reach. Bed in lowest position. Pending admission bed. Plan of care ongoing.   1:38 AM  Patient sleeping with equal rise and fall of the chest wall. Patient in NAD. No needs at this time. Call light in reach. Bed in lowest position. Plan of care ongoing.   2:10 AM  Patient sleeping with equal rise and fall of the chest wall. Patient in NAD. No needs at this time. Call light in reach. Bed in lowest position. Plan of care ongoing.   3:20 AM  Patient sleeping with equal rise and fall of the chest wall. Patient in NAD. No needs at this time. Call light in reach. Bed in lowest position. Plan of care ongoing.   4:57 AM  Patient sleeping with equal rise and fall of the chest wall. Patient in NAD. No  needs at this time. Call light in reach. Bed in lowest position. Plan of care ongoing.   5:45 AM  Patient medicated per order.  6:45 AM  Patient resting with even respirations. Patient in NAD. No needs at this time. Call light in reach. Bed in lowest position. Plan of care ongoing.

## 2022-10-21 NOTE — Assessment & Plan Note (Addendum)
-  chest pain is pleuritic and positional. Also had radiation to neck, back and left UE. Seems atypical.Troponin x 3 is reassuring and flat at 10/9/5. EKG also with only non-specific T wave changes.  -Will also check Lipase  -given malignancy also concern for thromboembolism. Will obtain CTA chest.  -cardiology was consulted by EDP and will evaluate. May take for stress test tomorrow but did not recommend initiation of IV heparin. Will keep NPO after midnight.  -Last stress test in 2020 with EF of 63% and was a normal low risk study.  -Echocardiogram in 2022 with EF of 65-70% with grade II diastolic dysfunction, moderate MV regurgitation.  -PRN nitroglycerin  -continue daily aspirin  -keep on telemetry

## 2022-10-21 NOTE — H&P (Signed)
History and Physical    Patient: [name]:[id number] DOB: 1950-05-21 DOA: 10/21/2022 DOS: the patient was seen and examined on 10/21/2022 PCP: Ralene Ok, MD  Patient coming from: Home  Chief Complaint:  Chief Complaint  Patient presents with   Chest Pain   HPI: [name] is a 72 y.o. female with medical history significant of pulmonary sarcoidosis, chronic diastolic heart failure, hypertension, hypothyroidism, COPD, CAD, NASH cirrhosis, hepatocellular carcinoma, thrombocytopenia who presents with chest pain.  Pt was sitting around noon today and developed acute onset of sharp mid lower anterior chest pain with radiation to her back, neck and up her left shoulder. Pain worse with inspiration and with laying down. Had nausea and no vomiting. Pain only improved after she arrived to ED and was given nitroglycerin and aspirin and is now down to a soreness pain of 2/10. Tried to take tums, pepto-bismol and muscle relaxer at home without relieve. She has chronic LE with right always being worse. No recent travel or immobilization. Had percutaneous bland embolization and microwave ablation for a liver lesion on 07/21/2022.    In the ED, she was afebrile hypertensive with BP of 160/56 on room air.  No leukocytosis or anemia.  Chronic thrombocytopenia remains stable at 112.  Had mild hyponatremia of 134, potassium of 4.3, creatinine of 0.73.  AST and ALT mildly elevated at 52 and 55 respectively which is similar to prior.  Troponins are reassuring and flat at 10, 9 and 5.  EKG on my review in sinus rhythm with nonspecific T wave abnormalities.  Cardiology was consulted and will evaluate in the morning but did not recommend starting heparin.  Patient might potentially be taken for stress test in the morning.  Hospitalist then consulted for admission. Review of Systems: As mentioned in the history of present illness. All other systems reviewed and are negative. Past Medical History:   Diagnosis Date   Anxiety    Arthritis    Asthma    Blood dyscrasia    Thrombocytopenia   CAD (coronary artery disease)    CHF (congestive heart failure) (HCC)    Complication of anesthesia    trouble waking up once with Nissen Fundoplication   COPD (chronic obstructive pulmonary disease) (HCC)    Depression    Diabetes mellitus without complication (HCC)    Dysrhythmia    Heart murmur    hepatocellular ca    Hypertension    Hypothyroidism    Liver disease    NAFLD cirrhosis   Mild aortic stenosis    Pneumonia    Sarcoidosis    Sarcoidosis of lung (HCC)    Sarcoidosis of lymph nodes    Past Surgical History:  Procedure Laterality Date   ABDOMINAL HYSTERECTOMY     ABDOMINAL SURGERY     CHOLECYSTECTOMY     ESOPHAGOGASTRODUODENOSCOPY (EGD) WITH PROPOFOL N/A 04/17/2021   Procedure: ESOPHAGOGASTRODUODENOSCOPY (EGD) WITH PROPOFOL;  Surgeon: Jeani Hawking, MD;  Location: [facility]OS[facility];  Service: Gastroenterology;  Laterality: N/A;   HERNIA REPAIR     IR 3D INDEPENDENT WKST  07/21/2022   IR ANGIOGRAM SELECTIVE EACH ADDITIONAL VESSEL  07/21/2022   IR ANGIOGRAM VISCERAL SELECTIVE  07/21/2022   IR EMBO TUMOR ORGAN ISCHEMIA INFARCT INC GUIDE ROADMAPPING  07/21/2022   IR GUIDED LIVER TUMOR ABLATION RFA PERC  07/21/2022   IR US GUIDE VASC ACCESS RIGHT  07/21/2022   LASIK  2001   LEFT HEART CATH AND CORONARY ANGIOGRAPHY N/A 09/23/2020   Procedure: LEFT HEART  CATH AND CORONARY ANGIOGRAPHY;  Surgeon: Lennette Bihari, MD;  Location: [facility] INVASIVE CV LAB;  Service: Cardiovascular;  Laterality: N/A;   nissenfundiplication     RADIOLOGY WITH ANESTHESIA N/A 07/21/2022   Procedure: IR WITH ANESTHESIA MICROWAVE ABLATION;  Surgeon: Simonne Come, MD;  Location: [facility] ORS;  Service: Anesthesiology;  Laterality: N/A;   TOTAL KNEE ARTHROPLASTY Right 05/19/2021   Procedure: TOTAL KNEE ARTHROPLASTY;  Surgeon: Durene Romans, MD;  Location: [facility];  Service: Orthopedics;  Laterality: Right;   Social History:  reports  that she has never smoked. She has been exposed to tobacco smoke. She has never used smokeless tobacco. She reports that she does not drink alcohol and does not use drugs.  Allergies  Allergen Reactions   Sulfa Antibiotics Other (See Comments)    High fever and Body aches   Atorvastatin Other (See Comments)    Extreme muscle cramps and muscle aches   Astelin [Azelastine] Other (See Comments)    Nasal spray caused a burning sensation    Tape Other (See Comments)    TEARS OFF THE SKIN!!!!   Amoxicillin Rash and Other (See Comments)    Tolerated Ancef in 2023, received Rocephin inpatient & prescribed Omnicef outpatient prior to Amox allergy being documented.     Family History  Problem Relation Age of Onset   Diabetes Mother    Heart attack Mother    Heart failure Father    Hypertension Father    Heart attack Father    Heart Problems Sister    Blindness Sister    Diabetes Sister    Hypertension Sister    Heart attack Brother    Heart attack Brother    Throat cancer Brother    Healthy Son    Rheum arthritis Daughter     Prior to Admission medications   Medication Sig Start Date End Date Taking? Authorizing Provider  acetaminophen (TYLENOL) 325 MG tablet Take 650 mg by mouth every 6 (six) hours as needed for moderate pain.    [provider]  acyclovir (ZOVIRAX) 400 MG tablet Take 400 mg by mouth every 8 (eight) hours as needed (fever blisters). Fever blisters 05/19/20   [provider]  albuterol (VENTOLIN HFA) 108 (90 Base) MCG/ACT inhaler Inhale 2 puffs into the lungs every 6 (six) hours as needed. Patient taking differently: Inhale 2 puffs into the lungs every 6 (six) hours as needed for shortness of breath or wheezing. 04/08/22   Charlott Holler, MD  ALPRAZolam Prudy Feeler) 0.25 MG tablet Take 0.25 mg by mouth 3 (three) times daily as needed for anxiety. 04/27/22   [provider]  amLODipine (NORVASC) 10 MG tablet Take 1 tablet (10 mg total) by mouth daily.  11/04/21 10/30/22  Jodelle Red, MD  aspirin EC 81 MG tablet Take 81 mg by mouth daily. Swallow whole.    [provider]  Biotin 10 MG CAPS Take 10 mg by mouth daily.    [provider]  Calcium Carb-Cholecalciferol (CALCIUM 600 + D PO) Take 1 tablet by mouth daily.    [provider]  carvedilol (COREG) 25 MG tablet TAKE 1 TABLET BY MOUTH TWICE DAILY WITH A MEAL 05/06/22   Jodelle Red, MD  docusate sodium (COLACE) 100 MG capsule Take 1 capsule (100 mg total) by mouth 2 (two) times daily. Patient taking differently: Take 200 mg by mouth daily. 05/20/21   Cassandria Anger, PA-C  fluticasone (FLONASE) 50 MCG/ACT nasal spray Place 1 spray into both nostrils  daily. Patient taking differently: Place 1 spray into both nostrils at bedtime. 06/15/22   Charlott Holler, MD  furosemide (LASIX) 20 MG tablet Take 20-40 mg by mouth as directed. Take 1 tablet (20 mg) daily and Take 2 tablets (40 mg) Daily PRN for Swelling    [provider]  gabapentin (NEURONTIN) 300 MG capsule Take 300 mg by mouth 2 (two) times daily. 09/02/20   [provider]  irbesartan (AVAPRO) 150 MG tablet Take 1 tablet by mouth once daily 09/16/22   Jodelle Red, MD  levothyroxine (SYNTHROID, LEVOTHROID) 150 MCG tablet Take 75-150 mcg by mouth See admin instructions. Pt alternates taking 150 mcg one day and 75 mcg the next with before breakfast    [provider]  metFORMIN (GLUCOPHAGE-XR) 500 MG 24 hr tablet Take 2 tablets (1,000 mg total) by mouth 2 (two) times daily. Patient taking differently: Take 500 mg by mouth daily with breakfast. 09/25/20   Marinda Elk, MD  nitrofurantoin, macrocrystal-monohydrate, (MACROBID) 100 MG capsule Take 1 capsule (100 mg total) by mouth 2 (two) times daily. X 7 days 07/27/22   Gilda Crease, MD  rosuvastatin (CRESTOR) 10 MG tablet Take 10 mg by mouth daily.    [provider]  Semaglutide,0.25 or  0.5MG /DOS, (OZEMPIC, 0.25 OR 0.5 MG/DOSE,) 2 MG/3ML SOPN Inject 0.5 mg into the skin once a week.    [provider]  spironolactone (ALDACTONE) 25 MG tablet Take 25 mg by mouth daily. 11/22/21   [provider]  Tetrahydrozoline-Zn Sulfate (ALLERGY RELIEF EYE DROPS OP) Place 1 drop into both eyes daily as needed (allergies).    [provider]  tiZANidine (ZANAFLEX) 4 MG tablet Take 4 mg by mouth every 8 (eight) hours as needed for muscle spasms. 02/10/22   [provider]  zolpidem (AMBIEN) 10 MG tablet Take 10 mg by mouth at bedtime.    [provider]    Physical Exam: Vitals:   10/21/22 1949 10/21/22 2000 10/21/22 2030 10/21/22 2100  BP: (!) 125/56 (!) 138/57 (!) 137/49 (!) 152/61  Pulse: 87 88 82 87  Resp: 19 (!) 24 (!) 26 (!) 23  Temp: 98.9 F (37.2 C)     TempSrc: Oral     SpO2: 96% 96% 94% 96%  Weight:      Height:       Constitutional: NAD, calm, comfortable, elderly female appearing younger than stated age laying upright in bed Eyes: lids and conjunctivae normal ENMT: Mucous membranes are moist.  Neck: normal, supple Respiratory: clear to auscultation bilaterally, no wheezing, no crackles. Normal respiratory effort. No accessory muscle use.  Cardiovascular: Regular rate and rhythm, systolic flow murmur.+1 pitting edema of bilateral ankle. Lower mid-sternal chest pain reproducible on exam.  Abdomen: no tenderness, soft, non-distended. Bowel sounds positive.  Musculoskeletal: no clubbing / cyanosis. No joint deformity upper and lower extremities. Normal muscle tone.  Skin: no rashes, lesions, ulcers. No induration Neurologic: CN 2-12 grossly intact.  Psychiatric: Normal judgment and insight. Alert and oriented x 3. Normal mood.   Data Reviewed:  See HPI  Assessment and Plan: * Chest pain -chest pain is pleuritic and positional. Also had radiation to neck, back and left UE. Seems atypical.Troponin x 3 is reassuring and flat at  10/9/5. EKG also with only non-specific T wave changes.  -Will also check Lipase  -given malignancy also concern for thromboembolism. Will obtain CTA chest.  -cardiology was consulted by EDP and will evaluate. May take for stress  test tomorrow but did not recommend initiation of IV heparin. Will keep NPO after midnight.  -Last stress test in 2020 with EF of 63% and was a normal low risk study.  -Echocardiogram in 2022 with EF of 65-70% with grade II diastolic dysfunction, moderate MV regurgitation.  -PRN nitroglycerin  -continue daily aspirin  -keep on telemetry   Chronic diastolic CHF (congestive heart failure) (HCC) -had LE edema on exam which is chronic for her. Continue daily home Lasix.   Controlled type 2 diabetes mellitus without complication, without long-term current use of insulin (HCC) -last A1C in July of 6.1 -keep on low dose SSI  Hepatocellular carcinoma (HCC) - History of [place] cirrhosis.  Diagnosis recently confirmed based on MRI imaging. - Currently under evaluation for liver transplant by [facility] although was told she is not being placed on waiting list at this time due to age and comorbities - Follows with Dr. Leonides Schanz of oncology. Has upcoming follow MRI liver this month.  Thrombocytopenia (HCC) - Stable and Chronic secondary to cirrhosis  HTN (hypertension) - Elevated likely secondary to chest pain -continue amlodipine, Coreg , irbesartan, Lasix, spironolactone  Hypothyroidism - Continue levothyroxine      Advance Care Planning:   Code Status: Full Code   Consults: CARDIOLOGY  Family Communication: none at bedside  Severity of Illness: The appropriate patient status for this patient is OBSERVATION. Observation status is judged to be reasonable and necessary in order to provide the required intensity of service to ensure the patient's safety. The patient's presenting symptoms, physical exam findings, and initial radiographic and laboratory data in the  context of their medical condition is felt to place them at decreased risk for further clinical deterioration. Furthermore, it is anticipated that the patient will be medically stable for discharge from the hospital within 2 midnights of admission.   Author: Anselm Jungling, DO 10/21/2022 10:30 PM  For on call review [email or web address].

## 2022-10-21 NOTE — Assessment & Plan Note (Addendum)
-   History of [place] cirrhosis.  Diagnosis recently confirmed based on MRI imaging. - Currently under evaluation for liver transplant by [facility] although was told she is not being placed on waiting list at this time due to age and comorbities - Follows with Dr. Leonides Schanz of oncology. Has upcoming follow MRI liver this month.

## 2022-10-21 NOTE — ED Provider Triage Note (Signed)
  Emergency Medicine Provider Triage Evaluation Note  [name] , a 72 y.o. female  was evaluated in triage.  Pt complains of persistent chest pain since noon. Radiates to back, neck, and left shoulder.  Review of Systems  Positive: Chest pain, back pain Negative: Abdominal pain  Physical Exam  BP (!) 163/56   Pulse 88   Temp 98.6 F (37 C) (Oral)   Resp 15   Ht 5\' 2"  (1.575 m)   Wt 80.7 kg   SpO2 93%   BMI 32.56 kg/m  Gen:   Awake, no distress   Resp:  Normal effort  MSK:   Moves extremities without difficulty  Other:    Medical Decision Making  Medically screening exam initiated at 3:45 PM.  Appropriate orders placed.  [name] was informed that the remainder of the evaluation will be completed by another provider, this initial triage assessment does not replace that evaluation, and the importance of remaining in the ED until their evaluation is complete.    Maxwell Marion, PA-C 10/21/22 1550

## 2022-10-21 NOTE — Assessment & Plan Note (Signed)
-  last A1C in July of 6.1 -keep on low dose SSI

## 2022-10-22 ENCOUNTER — Observation Stay (HOSPITAL_BASED_OUTPATIENT_CLINIC_OR_DEPARTMENT_OTHER): Payer: Medicare Other

## 2022-10-22 DIAGNOSIS — R072 Precordial pain: Secondary | ICD-10-CM | POA: Diagnosis not present

## 2022-10-22 DIAGNOSIS — R079 Chest pain, unspecified: Secondary | ICD-10-CM | POA: Diagnosis not present

## 2022-10-22 LAB — ECHOCARDIOGRAM COMPLETE
AR max vel: 2.05 cm2
AV Area VTI: 1.85 cm2
AV Area mean vel: 2.04 cm2
AV Mean grad: 17 mm[Hg]
AV Peak grad: 30.7 mm[Hg]
Ao pk vel: 2.77 m/s
Area-P 1/2: 2.82 cm2
Calc EF: 66.5 %
Height: 62 in
MV M vel: 6.05 m/s
MV Peak grad: 146.2 mm[Hg]
S' Lateral: 2.9 cm
Single Plane A2C EF: 71 %
Single Plane A4C EF: 65.9 %
Weight: 2848 [oz_av]

## 2022-10-22 LAB — CBG MONITORING, ED
Glucose-Capillary: 110 mg/dL — ABNORMAL HIGH (ref 70–99)
Glucose-Capillary: 95 mg/dL (ref 70–99)

## 2022-10-22 LAB — C-REACTIVE PROTEIN: CRP: 4.6 mg/dL — ABNORMAL HIGH (ref <1.0)

## 2022-10-22 LAB — SEDIMENTATION RATE: Sed Rate: 15 mm/h (ref 0–22)

## 2022-10-22 MED ORDER — PERFLUTREN LIPID MICROSPHERE
1.0000 mL | INTRAVENOUS | Status: DC | PRN
  Administered 2022-10-22: 2 mL via INTRAVENOUS

## 2022-10-22 NOTE — Consult Note (Addendum)
Cardiology Consultation   Patient ID: [name] MRN: [id number]; DOB: 09/26/1950  Admit date: 10/21/2022 Date of Consult: 10/22/2022  PCP:  Ralene Ok, MD   [facility] Providers Cardiologist:  Jodelle Red, MD        Patient Profile:   [name] is a 72 y.o. 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 Hassan Rowan (Emergency Department).  History of Present Illness:   [name] 72 year old female with the above history who is followed by Dr. Cristal Deer.  Patient was admitted in 09/2020 for chest pain.  At that time showed LVEF of 65-70% with normal wall motion and grade 2 diastolic dysfunction, normal RV, and moderate MR and SAM.  She underwent cardiac catheterization which showed only mild disease with 20% stenosis of proximal LCx and otherwise normal coronaries.  She also underwent a cardiac MRI given her history of pulmonary sarcoidosis, SAM noted on Echo with concern for LVOT, and family history of sudden cardiac death in her brother.  MRI showed asymmetric septal hypertrophy without other evidence of infiltrative disease and SAM of the mitral valve as well as moderate MR.  Patient was last seen by Dr. Cristal Deer in 06/2022 at which time she was doing well from a cardiac standpoint.  Her main issue lately has been her end-stage liver disease -she has a history of NASH cirrhosis and now has hepatocellular carcinoma.  She underwent mesenteric arteriogram and planned embolization of hepatocellular carcinoma as well as percutaneous ultrasound and fluoroscopy guided ablation in 07/2022.  Patient presented to the ED on  10/21/2022 for further evaluation of chest pain. EKG showed normal sinus rhythm, rate 86 bpm, with T wave abnormalities in leads III, aVF, and V3-V6.  Of note, she has had similar changes transiently on prior EKGs.  High-sensitivity troponin negative x3 (10 >> 9 >> 5).  BNP normal.  Chest x-ray showed an enlarged cardiopericardial silhouette and a linear opacity in the left mid to lower lung felt to likely be scar or atelectasis.  Chest CTA was negative for PE but showed atelectasis or infiltration in both lung fields, cardiac enlargement, small esophageal hiatal hernia, hepatic cirrhosis with enlarged spleen, and diffuse thyroid enlargement consistent with goiter. WBC 8.0, Hgb 13.2, Plts 112. Na 134, K 4.3, Glucose 128, BUN 32, Cr 0.73. Albumin 3.7, AST 52, ALT 55, Alk Phos 118, Total Bili 0.6. Mag 2.0. Lipase normal. Patient was admitted and Cardiology consulted for further evaluation.  Patient reports she was in her usual state of health until yesterday around 12pm when she developed sudden onset of severe chest pain that radiated to her back, shoulder, and neck. She describes this as an "ache" with intermittent sharp pain. Pain was worse with deep inspiration and laying down. It was not worse with exertion. She tried Maalox and TUMs without any improvement. She reports pain finally resolved after getting Nitroglycerin in the ED and now is only some having some mild soreness. She notes diaphoresis with the pain and states it was difficult to take a deep breath but no other associated symptoms. She has chronic dyspnea on exertion (especially when walking uphill) due to her pulmonary sarcoidosis but this is stable. No shortness of breath at rest, orthopnea,  PND, edema.  No palpitations potation's, lightheadedness, dizziness, syncope.  She denies any recent fevers or illnesses.  She has chronic nasal congestion with allergies but no other URI symptoms.  No GI symptoms.  No abnormal bleeding in urine or stools.  She  states she is stable from a liver standpoint.  She is scheduled to have repeat MRI soon to determine whether any additional treatment is needed for her hepatocellular carcinoma.  She has been seen at [facility] in the past for possible liver transplant but is states this is currently on hold.   Past Medical History:  Diagnosis Date   Anxiety    Arthritis    Asthma    Blood dyscrasia    Thrombocytopenia   CAD (coronary artery disease)    CHF (congestive heart failure) (HCC)    Complication of anesthesia    trouble waking up once with Nissen Fundoplication   COPD (chronic obstructive pulmonary disease) (HCC)    Depression    Diabetes mellitus without complication (HCC)    Dysrhythmia    Heart murmur    hepatocellular ca    Hypertension    Hypothyroidism    Liver disease    NAFLD cirrhosis   Mild aortic stenosis    Pneumonia    Sarcoidosis    Sarcoidosis of lung (HCC)    Sarcoidosis of lymph nodes     Past Surgical History:  Procedure Laterality Date   ABDOMINAL HYSTERECTOMY     ABDOMINAL SURGERY     CHOLECYSTECTOMY     ESOPHAGOGASTRODUODENOSCOPY (EGD) WITH PROPOFOL N/A 04/17/2021   Procedure: ESOPHAGOGASTRODUODENOSCOPY (EGD) WITH PROPOFOL;  Surgeon: Jeani Hawking, MD;  Location: [facility];  Service: Gastroenterology;  Laterality: N/A;   HERNIA REPAIR     IR 3D INDEPENDENT WKST  07/21/2022   IR ANGIOGRAM SELECTIVE EACH ADDITIONAL VESSEL  07/21/2022   IR ANGIOGRAM VISCERAL SELECTIVE  07/21/2022   IR EMBO TUMOR ORGAN ISCHEMIA INFARCT INC GUIDE ROADMAPPING  07/21/2022   IR GUIDED LIVER TUMOR ABLATION RFA PERC  07/21/2022   IR US GUIDE VASC ACCESS RIGHT  07/21/2022   LASIK  2001   LEFT HEART CATH AND CORONARY ANGIOGRAPHY N/A 09/23/2020   Procedure: LEFT HEART CATH AND CORONARY ANGIOGRAPHY;  Surgeon: Lennette Bihari, MD;  Location: [facility];  Service: Cardiovascular;  Laterality: N/A;   nissenfundiplication     RADIOLOGY WITH ANESTHESIA N/A 07/21/2022   Procedure: IR WITH  ANESTHESIA MICROWAVE ABLATION;  Surgeon: Simonne Come, MD;  Location: [facility];  Service: Anesthesiology;  Laterality: N/A;   TOTAL KNEE ARTHROPLASTY Right 05/19/2021   Procedure: TOTAL KNEE ARTHROPLASTY;  Surgeon: Durene Romans, MD;  Location: [facility];  Service: Orthopedics;  Laterality: Right;     Home Medications:  Prior to Admission medications   Medication Sig Start Date End Date Taking? Authorizing Provider  acetaminophen (TYLENOL) 325 MG tablet Take 325-650 mg by mouth every 6 (six) hours as needed for mild pain, moderate pain or headache.   Yes [provider]  acyclovir (ZOVIRAX) 400 MG tablet Take 400 mg by mouth every 8 (eight) hours as needed (fever blisters). Fever blisters 05/19/20  Yes [provider]  albuterol (VENTOLIN HFA) 108 (90 Base) MCG/ACT inhaler Inhale 2 puffs into the lungs every 6 (six) hours as needed. Patient taking differently: Inhale 2 puffs into the lungs every 6 (six) hours as needed for shortness of breath or wheezing. 04/08/22  Yes Charlott Holler, MD  ALPRAZolam Prudy Feeler) 0.25 MG tablet Take 0.25 mg  by mouth 3 (three) times daily as needed for anxiety. 04/27/22  Yes [provider]  amLODipine (NORVASC) 10 MG tablet Take 1 tablet (10 mg total) by mouth daily. Patient taking differently: Take 10 mg by mouth daily as needed (if the Systolic number is 150 or greater). 11/04/21 10/30/22 Yes Jodelle Red, MD  aspirin EC 81 MG tablet Take 81 mg by mouth daily. Swallow whole.   Yes [provider]  Biotin 10 MG CAPS Take 10 mg by mouth daily.   Yes [provider]  carvedilol (COREG) 25 MG tablet TAKE 1 TABLET BY MOUTH TWICE DAILY WITH A MEAL 05/06/22  Yes Jodelle Red, MD  cetirizine (ZYRTEC) 10 MG tablet Take 10 mg by mouth daily.   Yes [provider]  docusate sodium (COLACE) 100 MG capsule Take 1 capsule (100 mg total) by mouth 2 (two) times daily. Patient taking differently: Take 200 mg by mouth  daily. 05/20/21  Yes Cassandria Anger, PA-C  fluticasone (FLONASE) 50 MCG/ACT nasal spray Place 1 spray into both nostrils daily. Patient taking differently: Place 1 spray into both nostrils at bedtime. 06/15/22  Yes Charlott Holler, MD  furosemide (LASIX) 20 MG tablet Take 20-40 mg by mouth See admin instructions. Take 20 mg by mouth in the morning and increase dose to 40 mg for swelling or edema   Yes [provider]  gabapentin (NEURONTIN) 300 MG capsule Take 300 mg by mouth 2 (two) times daily. 09/02/20  Yes [provider]  irbesartan (AVAPRO) 150 MG tablet Take 1 tablet by mouth once daily 09/16/22  Yes Jodelle Red, MD  levothyroxine (SYNTHROID, LEVOTHROID) 150 MCG tablet Take 75-150 mcg by mouth See admin instructions. Take 150 mcg by mouth in the morning 30 minutes before breakfast and ALTERNATE with 150 mcg EVERY OTHER DAY   Yes [provider]  metFORMIN (GLUCOPHAGE-XR) 500 MG 24 hr tablet Take 2 tablets (1,000 mg total) by mouth 2 (two) times daily. Patient taking differently: Take 500 mg by mouth daily with breakfast. 09/25/20  Yes Marinda Elk, MD  rosuvastatin (CRESTOR) 10 MG tablet Take 10 mg by mouth daily.   Yes [provider]  Semaglutide,0.25 or 0.5MG /DOS, (OZEMPIC, 0.25 OR 0.5 MG/DOSE,) 2 MG/3ML SOPN Inject 0.5 mg into the skin See admin instructions. Inject 0.5 mg into the skin every 7-10 days   Yes [provider]  spironolactone (ALDACTONE) 25 MG tablet Take 25 mg by mouth daily. 11/22/21  Yes [provider]  tiZANidine (ZANAFLEX) 4 MG tablet Take 2-4 mg by mouth every 8 (eight) hours as needed for muscle spasms. 02/10/22  Yes [provider]  traMADol (ULTRAM) 50 MG tablet Take 50 mg by mouth daily as needed for moderate pain.   Yes [provider]  zolpidem (AMBIEN) 10 MG tablet Take 10 mg by mouth at bedtime.   Yes [provider]  nitrofurantoin, macrocrystal-monohydrate, (MACROBID)  100 MG capsule Take 1 capsule (100 mg total) by mouth 2 (two) times daily. X 7 days Patient not taking: Reported on 10/21/2022 07/27/22   Gilda Crease, MD    Inpatient Medications: Scheduled Meds:  amLODipine  10 mg Oral Daily   aspirin EC  81 mg Oral Daily   carvedilol  25 mg Oral BID WC   docusate sodium  200 mg Oral Daily   enoxaparin (LOVENOX) injection  40 mg Subcutaneous Q24H   fluticasone  1 spray Each Nare QHS   furosemide  20 mg  Oral Daily   gabapentin  300 mg Oral BID   insulin aspart  0-6 Units Subcutaneous TID WC   irbesartan  150 mg Oral Daily   levothyroxine  150 mcg Oral Q48H   [START ON 10/23/2022] levothyroxine  75 mcg Oral Q48H   rosuvastatin  10 mg Oral Daily   spironolactone  25 mg Oral Daily   zolpidem  5 mg Oral QHS   Continuous Infusions:  PRN Meds: acetaminophen, albuterol, ALPRAZolam, nitroGLYCERIN, ondansetron (ZOFRAN) IV  Allergies:    Allergies  Allergen Reactions   Sulfa Antibiotics Other (See Comments)    High fever and Body aches   Tape Other (See Comments)    TEARS OFF THE SKIN!!!!   Atorvastatin Other (See Comments)    Extreme muscle cramps and muscle aches   Astelin [Azelastine] Other (See Comments)    Nasal spray caused a burning sensation    Amoxicillin Rash and Other (See Comments)    Tolerated Ancef in 2023, received Rocephin inpatient & prescribed Omnicef outpatient prior to Amox allergy being documented.     Social History:   Social History   Socioeconomic History   Marital status: Married    Spouse name: Not on file   Number of children: Not on file   Years of education: Not on file   Highest education level: Not on file  Occupational History   Not on file  Tobacco Use   Smoking status: Never    Passive exposure: Past (minimal)   Smokeless tobacco: Never  Vaping Use   Vaping status: Never Used  Substance and Sexual Activity   Alcohol use: No   Drug use: No   Sexual activity: Not Currently  Other Topics  Concern   Not on file  Social History Narrative   Not on file   Social Determinants of Health   Financial Resource Strain: Not on file  Food Insecurity: No Food Insecurity (07/24/2022)   Hunger Vital Sign    Worried About Running Out of Food in the Last Year: Never true    Ran Out of Food in the Last Year: Never true  Transportation Needs: No Transportation Needs (07/24/2022)   PRAPARE - Administrator, Civil Service (Medical): No    Lack of Transportation (Non-Medical): No  Physical Activity: Not on file  Stress: Not on file  Social Connections: Not on file  Intimate Partner Violence: Not At Risk (07/24/2022)   Humiliation, Afraid, Rape, and Kick questionnaire    Fear of Current or Ex-Partner: No    Emotionally Abused: No    Physically Abused: No    Sexually Abused: No    Family History:   Family History  Problem Relation Age of Onset   Diabetes Mother    Heart attack Mother    Heart failure Father    Hypertension Father    Heart attack Father    Heart Problems Sister    Blindness Sister    Diabetes Sister    Hypertension Sister    Heart attack Brother    Heart attack Brother    Throat cancer Brother    Healthy Son    Rheum arthritis Daughter      ROS:  Please see the history of present illness.  All other ROS reviewed and negative.     Physical Exam/Data:   Vitals:   10/22/22 0430 10/22/22 0500 10/22/22 0530 10/22/22 0641  BP: (!) 137/51 (!) 133/47 (!) 133/45 (!) 130/52  Pulse: 68 69 69 68  Resp: 18 18 17 18   Temp:    97.7 F (36.5 C)  TempSrc:    Oral  SpO2: 93% 96% 94% 95%  Weight:      Height:       No intake or output data in the 24 hours ending 10/22/22 0706    10/21/2022    3:33 PM 08/06/2022    2:48 PM 07/26/2022    7:35 PM  Last 3 Weights  Weight (lbs) 178 lb 177 lb 14.4 oz 175 lb  Weight (kg) 80.74 kg 80.695 kg 79.379 kg     Body mass index is 32.56 kg/m.  General: 72 y.o. Caucasian female resting comfortably in no acute  distress. HEENT: Normocephalic and atraumatic. Sclera clear.  Neck: Supple. No carotid bruits.  Heart: RRR. Distinct S1 and S2. No murmurs, gallops, or rubs.  Lungs: No increased work of breathing. Clear to ausculation bilaterally. No wheezes, rhonchi, or rales.  Abdomen: Soft, non-distended, and non-tender to palpation.  Extremities: Trace lower extremity edema bilaterally. Skin: Warm and dry. Neuro: Alert and oriented x3. No focal deficits. Psych: Normal affect. Responds appropriately.   EKG:  The EKG was personally reviewed and demonstrates:  Normal sinus rhythm, rate 86 bpm, with T wave abnormalities in leads III, aVF, and V3-V6.  Of note, she has had similar changes transiently on prior EKGs. Telemetry:  Telemetry was personally reviewed and demonstrates:  Normal sinus rhythm with rates in the 70s to 80s.  Relevant CV Studies:  Echocardiogram 09/23/2020: Impressions: 1. Left ventricular ejection fraction, by estimation, is 65 to 70%. The  left ventricle has normal function. The left ventricle has no regional  wall motion abnormalities. Left ventricular diastolic parameters are  consistent with Grade II diastolic  dysfunction (pseudonormalization).   2. Right ventricular systolic function is normal. The right ventricular  size is normal.   3. Left atrial size was mildly dilated.   4. There is systolic anterior motion of the MV leaflet. . The mitral  valve is normal in structure. Moderate mitral valve regurgitation.   5. The aortic valve is normal in structure. Aortic valve regurgitation is  not visualized.  _______________  Left Cardiac Catheterization 09/23/2020:   Prox Cx lesion is 20% stenosed.   Mild nonobstructive CAD with tortuous vessels and smooth 20% narrowing in the proximal circumflex far artery.  The LAD appears angiographically normal.  The right coronary artery is a dominant vessel with superior takeoff.   Mild LV to aorta gradient on pullback with a peak to peak  gradient at approximately 16 mmHg.   Recommendation: Medical therapy.  Diagnostic Dominance: Right   _______________  Cardiac MRI 09/25/2020: Impressions: Asymmetric septal hypertrophy without other evidence of infiltrative disease and systolic anterior motion of the mitral valve: This can be seen in hypertropic obstructive cardiomyopathy.   Moderate mitral regurgitation.    Laboratory Data:  High Sensitivity Troponin:   Recent Labs  Lab 10/21/22 1655 10/21/22 1850  TROPONINIHS 9  10 5      Chemistry Recent Labs  Lab 10/21/22 1655  NA 134*  K 4.3  CL 101  CO2 22  GLUCOSE 128*  BUN 32*  CREATININE 0.73  CALCIUM 9.7  MG 2.0  GFRNONAA >60  ANIONGAP 11    Recent Labs  Lab 10/21/22 1655  PROT 7.7  ALBUMIN 3.7  AST 52*  ALT 55*  ALKPHOS 118  BILITOT 0.6   Lipids No results for input(s): "CHOL", "TRIG", "HDL", "LABVLDL", "LDLCALC", "CHOLHDL" in the last 168 hours.  Hematology Recent Labs  Lab 10/21/22 1655  WBC 8.0  RBC 4.57  HGB 13.2  HCT 40.9  MCV 89.5  MCH 28.9  MCHC 32.3  RDW 12.8  PLT 112*   Thyroid No results for input(s): "TSH", "FREET4" in the last 168 hours.  BNP Recent Labs  Lab 10/21/22 1655  BNP 77.3    DDimer No results for input(s): "DDIMER" in the last 168 hours.   Radiology/Studies:  CT Angio Chest Pulmonary Embolism (PE) W or WO Contrast  Result Date: 10/21/2022 CLINICAL DATA:  Nontraumatic chest wall pain. Pleuritic chest pain. History of hepatocellular carcinoma. EXAM: CT ANGIOGRAPHY CHEST WITH CONTRAST TECHNIQUE: Multidetector CT imaging of the chest was performed using the standard protocol during bolus administration of intravenous contrast. Multiplanar CT image reconstructions and MIPs were obtained to evaluate the vascular anatomy. RADIATION DOSE REDUCTION: This exam was performed according to the departmental dose-optimization program which includes automated exposure control, adjustment of the mA and/or kV according  to patient size and/or use of iterative reconstruction technique. CONTRAST:  80mL OMNIPAQUE IOHEXOL 350 MG/ML SOLN COMPARISON:  07/27/2022 FINDINGS: Cardiovascular: Good opacification of the central and segmental pulmonary arteries with mild motion artifact. No focal filling defects are demonstrated. No evidence of significant pulmonary embolus. Cardiac enlargement. No pericardial effusions. Normal caliber thoracic aorta. No aortic dissection. Calcification of the aorta and coronary arteries. Mediastinum/Nodes: Small esophageal hiatal hernia. Esophagus is decompressed. Scattered lymph nodes are in the mediastinum, largest right paratracheal nodes measuring 1.4 cm short axis dimension. No change since prior study, nonspecific but most likely reactive. Prominent diffuse enlargement of the thyroid gland is unchanged since prior study. No focal nodularity is identified consistent with thyroid goiter. Lungs/Pleura: Motion artifact limits examination. There is atelectasis or consolidation in both lung bases. No pleural effusions. No pneumothorax. No focal lung nodules. Upper Abdomen: Hepatic cirrhosis with splenic enlargement. Known neoplasm is not demonstrated on this early arterial phase. Musculoskeletal: No focal bone lesions. Review of the MIP images confirms the above findings. IMPRESSION: 1. No evidence of significant pulmonary embolus. 2. Atelectasis or infiltration in both lung bases. 3. Cardiac enlargement.  Aortic atherosclerosis. 4. Small esophageal hiatal hernia. 5. Hepatic cirrhosis with enlarged spleen. 6. Diffuse thyroid enlargement consistent with goiter. No change since prior study. This has been evaluated on previous imaging. (ref: J Am Coll Radiol. 2015 Feb;12(2): 143-50). Electronically Signed   By: Burman Nieves M.D.   On: 10/21/2022 23:00   DG Chest 2 View  Result Date: 10/21/2022 CLINICAL DATA:  Chest pain EXAM: CHEST - 2 VIEW COMPARISON:  07/26/2022 x-ray and CT angiogram FINDINGS:  Underinflation. Enlarged cardiopericardial silhouette. Linear opacity lung left lung base likely scar or atelectasis. No pneumothorax, effusion or edema. Calcified aorta. Overlapping cardiac leads. IMPRESSION: Enlarged cardiopericardial silhouette.  Calcified aorta. Linear opacity left mid to lower lung, likely scar or atelectasis. Recommend follow-up Electronically Signed   By: Karen Kays M.D.   On: 10/21/2022 17:48     Assessment and Plan:   Chest Pain Mild Non-Obstructive CAD Patient has a history of very mild nonobstructive CAD noted on prior cardiac catheterization in 09/2020.  He now presents with atypical chest pain that was worse with deep inspiration and laying supine.  EKG shows normal sinus rhythm with T wave abnormalities in leads III, aVF, and V3-V6 which have been seen transiently in the past.  High-sensitivity troponin negative x3. Chest CTA negative for PE. - The chest pain that brought her in resolved with Nitroglycerin and she  now just reports some mild chest soreness.  - Continue aspirin and statin. - Description of pain sounds like pericarditis. However, EKG is not consistent with this. Will check Echo and CRP and Sed Rate (although suspect these will be mildly elevated with her cirrhosis). Low suspicion for ACS. Could consider coronary CTA but this could be done as an outptient if Echo normal (patient is eager to go home).  Chronic HFpEF SAM of the Mitral Valve with Asymmetric Septal Hypertrophy Moderate MR Last Echo in 09/2020 showed LVEF of 65-70% with normal wall motion and grade 2 diastolic dysfunction, normal RV, and moderate MR and SAM. Cardiac MRI at that time showed asymmetric septal hypertrophy without other evidence of infiltrative disease and SAM of the mitral valve as well as moderate MR. BNP normal. No overt edema noted on chest x-ray or CTA. She got 1 dose of IV Lasix 20mg  in the ED. - Euvolemic on exam. - Will repeat Echo as above. - Continue PO Lasix 20mg   daily. - Continue Spironolactone 25mg  daily.  - Continue to monitor volume status closely.   Hypertension Patient hypertensive on arrival with systolic BP as high as the 170s but BP improved this morning.  - Continue Amlodipine 10mg  daily.  - Continue Coreg 25mg  twice daily.  - Continue Irbesartan 150mg  daily. - Continue Spironolactone 25mg  daily.  Hyperlipidemia - Continue Crestor 10mg  daily.   Otherwise, per primary team: - Pulmonary sarcoidosis - NASH cirrhosis - Hepatocellular carcinoma - Hypothyroidism  - Thrombocytopenia  - Type 2 diabetes mellitus    Risk Assessment/Risk Scores:    New York Heart Association (NYHA) Functional Class NYHA Class II =  For questions or updates, please contact [facility] Please consult [email or web address] for contact info under    Signed, Corrin Parker, PA-C  10/22/2022 7:06 AM  History and all data above reviewed.  Patient examined.  I agree with the findings as above.   The patient is followed by Dr. Cristal Deer.  She has much regurgitation.  She has had no coronary disease on cath in 2022.  She had just some minimal circumflex plaque.  She presents with chest discomfort.  This started suddenly.  It was mid chest radiating straight through to her back.  It was a cramping discomfort.  It was 6 out of 10 in intensity.  Her husband drove her to the ER and she did have resolution eventually after nitroglycerin.  Cardiac enzymes have been negative.  EKG has not been diagnostic.  She otherwise been feeling well.  She not been having any chest pressure, neck or arm discomfort.  She not been having any shortness of breath, PND or orthopnea.  She does have moderate mitral regurgitation.  The patient exam reveals COR: Regular rate and rhythm, late systolic murmur heard at the apex and best into the axilla, no diastolic murmurs,  Lungs: Clear to auscultation bilaterally,  Abd: Positive bowel sounds normal frequency in pitch, bruits, rebound, no  guarding, mild tenderness in the right upper quadrant, Ext 2+ pulses, no edema.  All available labs, radiology testing, previous records reviewed. Agree with documented assessment and plan. Chest pain: Her pain has nonanginal features.  There is no objective evidence of ischemia.  She had only very mild plaque a couple of years ago on cath.  The pretest probability of obstructive coronary disease is very low.  I do not think this is an acute coronary syndrome.  We will check an echocardiogram.  However, I do not think further  cardiac workup will be necessary.  The features are nonanginal greater than anginal.  She has been pain-free.  Given echocardiogram is okay she could be discharged but I will defer to the primary service.  Much regurgitation: This was moderate on echo couple of years ago.  I will follow-up with the echo as above.  Hypertrophic cardiomyopathy: This is being managed conservatively and followed by Dr. Cristal Deer.  Continue current diuretic.  Continue meds as listed.  Fayrene Fearing Albi Rappaport  1:22 PM  10/22/2022

## 2022-10-22 NOTE — Discharge Summary (Signed)
levothyroxine 150 MCG tablet Commonly known as: SYNTHROID Take 75-150 mcg by mouth See admin instructions. Take 150 mcg by mouth in the morning 30 minutes before breakfast and ALTERNATE with 150 mcg EVERY OTHER DAY   metFORMIN 500 MG 24 hr tablet Commonly known as: GLUCOPHAGE-XR Take 2 tablets (1,000 mg total) by mouth 2 (two) times daily. What changed:  how much to take when to take this   Ozempic (0.25 or 0.5 MG/DOSE) 2 MG/3ML Sopn Generic drug: Semaglutide(0.25 or 0.5MG /DOS) Inject 0.5 mg into the skin See admin instructions. Inject 0.5 mg into the skin every 7-10 days   rosuvastatin 10 MG tablet Commonly known as: CRESTOR Take 10 mg by mouth daily.   spironolactone 25 MG tablet Commonly known as: ALDACTONE Take 25 mg by mouth daily.   tiZANidine 4 MG tablet Commonly known as: ZANAFLEX Take 2-4 mg by mouth every 8 (eight) hours as needed for muscle spasms.   traMADol 50 MG tablet Commonly known as: ULTRAM Take 50 mg by mouth daily as needed for moderate pain.   zolpidem 10 MG tablet Commonly known as: AMBIEN Take 10 mg by mouth at bedtime.        Follow-up Information     Ralene Ok, MD Follow up.    Specialty: Internal Medicine Contact information: [address] [place] [address] [phone]         Jodelle Red, MD Follow up.   Specialty: Cardiology Contact information: [address] [place] [address] [phone]                Allergies  Allergen Reactions   Sulfa Antibiotics Other (See Comments)    High fever and Body aches   Tape Other (See Comments)    TEARS OFF THE SKIN!!!!   Atorvastatin Other (See Comments)    Extreme muscle cramps and muscle aches   Astelin [Azelastine] Other (See Comments)    Nasal spray caused a burning sensation    Amoxicillin Rash and Other (See Comments)    Tolerated Ancef in 2023, received Rocephin inpatient & prescribed Omnicef outpatient prior to Amox allergy being documented.     Consultations: Cardiology   Procedures/Studies: ECHOCARDIOGRAM COMPLETE  Result Date: 10/22/2022    ECHOCARDIOGRAM REPORT   Patient Name:   [name] [facility] Date of Exam: 10/22/2022 Medical Rec #:  [id number]    Height:       62.0 in Accession #:    [id number]   Weight:       178.0 lb Date of Birth:  12/08/1950    BSA:          1.819 m Patient Age:    72 years     BP:           147/60 mmHg Patient Gender: F            HR:           68 bpm. Exam Location:  Inpatient Procedure: 2D Echo, Color Doppler, Cardiac Doppler and Intracardiac            Opacification Agent Indications:    Chest pain  History:        Patient has prior history of Echocardiogram examinations, most                 recent 09/23/2020. CHF, CAD, COPD, Signs/Symptoms:Murmur; Risk                 Factors:Hypertension and Diabetes. Sarcoidosis.  Sonographer:    Carollee Herter  levothyroxine 150 MCG tablet Commonly known as: SYNTHROID Take 75-150 mcg by mouth See admin instructions. Take 150 mcg by mouth in the morning 30 minutes before breakfast and ALTERNATE with 150 mcg EVERY OTHER DAY   metFORMIN 500 MG 24 hr tablet Commonly known as: GLUCOPHAGE-XR Take 2 tablets (1,000 mg total) by mouth 2 (two) times daily. What changed:  how much to take when to take this   Ozempic (0.25 or 0.5 MG/DOSE) 2 MG/3ML Sopn Generic drug: Semaglutide(0.25 or 0.5MG /DOS) Inject 0.5 mg into the skin See admin instructions. Inject 0.5 mg into the skin every 7-10 days   rosuvastatin 10 MG tablet Commonly known as: CRESTOR Take 10 mg by mouth daily.   spironolactone 25 MG tablet Commonly known as: ALDACTONE Take 25 mg by mouth daily.   tiZANidine 4 MG tablet Commonly known as: ZANAFLEX Take 2-4 mg by mouth every 8 (eight) hours as needed for muscle spasms.   traMADol 50 MG tablet Commonly known as: ULTRAM Take 50 mg by mouth daily as needed for moderate pain.   zolpidem 10 MG tablet Commonly known as: AMBIEN Take 10 mg by mouth at bedtime.        Follow-up Information     Ralene Ok, MD Follow up.    Specialty: Internal Medicine Contact information: [address] [place] [address] [phone]         Jodelle Red, MD Follow up.   Specialty: Cardiology Contact information: [address] [place] [address] [phone]                Allergies  Allergen Reactions   Sulfa Antibiotics Other (See Comments)    High fever and Body aches   Tape Other (See Comments)    TEARS OFF THE SKIN!!!!   Atorvastatin Other (See Comments)    Extreme muscle cramps and muscle aches   Astelin [Azelastine] Other (See Comments)    Nasal spray caused a burning sensation    Amoxicillin Rash and Other (See Comments)    Tolerated Ancef in 2023, received Rocephin inpatient & prescribed Omnicef outpatient prior to Amox allergy being documented.     Consultations: Cardiology   Procedures/Studies: ECHOCARDIOGRAM COMPLETE  Result Date: 10/22/2022    ECHOCARDIOGRAM REPORT   Patient Name:   [name] [facility] Date of Exam: 10/22/2022 Medical Rec #:  [id number]    Height:       62.0 in Accession #:    [id number]   Weight:       178.0 lb Date of Birth:  12/08/1950    BSA:          1.819 m Patient Age:    72 years     BP:           147/60 mmHg Patient Gender: F            HR:           68 bpm. Exam Location:  Inpatient Procedure: 2D Echo, Color Doppler, Cardiac Doppler and Intracardiac            Opacification Agent Indications:    Chest pain  History:        Patient has prior history of Echocardiogram examinations, most                 recent 09/23/2020. CHF, CAD, COPD, Signs/Symptoms:Murmur; Risk                 Factors:Hypertension and Diabetes. Sarcoidosis.  Sonographer:    Carollee Herter  levothyroxine 150 MCG tablet Commonly known as: SYNTHROID Take 75-150 mcg by mouth See admin instructions. Take 150 mcg by mouth in the morning 30 minutes before breakfast and ALTERNATE with 150 mcg EVERY OTHER DAY   metFORMIN 500 MG 24 hr tablet Commonly known as: GLUCOPHAGE-XR Take 2 tablets (1,000 mg total) by mouth 2 (two) times daily. What changed:  how much to take when to take this   Ozempic (0.25 or 0.5 MG/DOSE) 2 MG/3ML Sopn Generic drug: Semaglutide(0.25 or 0.5MG /DOS) Inject 0.5 mg into the skin See admin instructions. Inject 0.5 mg into the skin every 7-10 days   rosuvastatin 10 MG tablet Commonly known as: CRESTOR Take 10 mg by mouth daily.   spironolactone 25 MG tablet Commonly known as: ALDACTONE Take 25 mg by mouth daily.   tiZANidine 4 MG tablet Commonly known as: ZANAFLEX Take 2-4 mg by mouth every 8 (eight) hours as needed for muscle spasms.   traMADol 50 MG tablet Commonly known as: ULTRAM Take 50 mg by mouth daily as needed for moderate pain.   zolpidem 10 MG tablet Commonly known as: AMBIEN Take 10 mg by mouth at bedtime.        Follow-up Information     Ralene Ok, MD Follow up.    Specialty: Internal Medicine Contact information: [address] [place] [address] [phone]         Jodelle Red, MD Follow up.   Specialty: Cardiology Contact information: [address] [place] [address] [phone]                Allergies  Allergen Reactions   Sulfa Antibiotics Other (See Comments)    High fever and Body aches   Tape Other (See Comments)    TEARS OFF THE SKIN!!!!   Atorvastatin Other (See Comments)    Extreme muscle cramps and muscle aches   Astelin [Azelastine] Other (See Comments)    Nasal spray caused a burning sensation    Amoxicillin Rash and Other (See Comments)    Tolerated Ancef in 2023, received Rocephin inpatient & prescribed Omnicef outpatient prior to Amox allergy being documented.     Consultations: Cardiology   Procedures/Studies: ECHOCARDIOGRAM COMPLETE  Result Date: 10/22/2022    ECHOCARDIOGRAM REPORT   Patient Name:   [name] [facility] Date of Exam: 10/22/2022 Medical Rec #:  [id number]    Height:       62.0 in Accession #:    [id number]   Weight:       178.0 lb Date of Birth:  12/08/1950    BSA:          1.819 m Patient Age:    72 years     BP:           147/60 mmHg Patient Gender: F            HR:           68 bpm. Exam Location:  Inpatient Procedure: 2D Echo, Color Doppler, Cardiac Doppler and Intracardiac            Opacification Agent Indications:    Chest pain  History:        Patient has prior history of Echocardiogram examinations, most                 recent 09/23/2020. CHF, CAD, COPD, Signs/Symptoms:Murmur; Risk                 Factors:Hypertension and Diabetes. Sarcoidosis.  Sonographer:    Carollee Herter  Physician Discharge Summary  [name] ZOX:[id number] DOB: 07-05-1950 DOA: 10/21/2022  PCP: Ralene Ok, MD  Admit date: 10/21/2022 Discharge date: 10/22/2022  Admitted From: Home Disposition: Home  Recommendations for Outpatient Follow-up:  Follow up with PCP, cardiology  Discharge Condition: Stable CODE STATUS: Full code Diet recommendation: Heart healthy diet  Brief/Interim Summary: From H&P: [name] is a 72 y.o. female with medical history significant of pulmonary sarcoidosis, chronic diastolic heart failure, hypertension, hypothyroidism, COPD, CAD, NASH cirrhosis, hepatocellular carcinoma, thrombocytopenia who presents with chest pain.   Pt was sitting around noon today and developed acute onset of sharp mid lower anterior chest pain with radiation to her back, neck and up her left shoulder. Pain worse with inspiration and with laying down. Had nausea and no vomiting. Pain only improved after she arrived to ED and was given nitroglycerin and aspirin and is now down to a soreness pain of 2/10. Tried to take tums, pepto-bismol and muscle relaxer at home without relieve. She has chronic LE with right always being worse. No recent travel or immobilization. Had percutaneous bland embolization and microwave ablation for a liver lesion on 07/21/2022.    In the ED, she was afebrile hypertensive with BP of 160/56 on room air.  No leukocytosis or anemia.  Chronic thrombocytopenia remains stable at 112.   Had mild hyponatremia of 134, potassium of 4.3, creatinine of 0.73.  AST and ALT mildly elevated at 52 and 55 respectively which is similar to prior.   Troponins are reassuring and flat at 10, 9 and 5.  EKG on my review in sinus rhythm with nonspecific T wave abnormalities.   Cardiology was consulted.  She underwent CTA chest which was negative for PE.  Troponin remained negative.  Echocardiogram was unremarkable. She remained chest pain-free in the emergency department  Discharge  Diagnoses:   Principal Problem:   Chest pain Active Problems:   Hypothyroidism   HTN (hypertension)   Thrombocytopenia (HCC)   Liver cirrhosis secondary to NASH (HCC)   Hepatocellular carcinoma (HCC)   Controlled type 2 diabetes mellitus without complication, without long-term current use of insulin (HCC)   Chronic diastolic CHF (congestive heart failure) [facility])    Discharge Instructions  Discharge Instructions     (HEART FAILURE PATIENTS) Call MD:  Anytime you have any of the following symptoms: 1) 3 pound weight gain in 24 hours or 5 pounds in 1 week 2) shortness of breath, with or without a dry hacking cough 3) swelling in the hands, feet or stomach 4) if you have to sleep on extra pillows at night in order to breathe.   Complete by: As directed    Call MD for:  difficulty breathing, headache or visual disturbances   Complete by: As directed    Call MD for:  extreme fatigue   Complete by: As directed    Call MD for:  persistant dizziness or light-headedness   Complete by: As directed    Call MD for:  persistant nausea and vomiting   Complete by: As directed    Call MD for:  severe uncontrolled pain   Complete by: As directed    Call MD for:  temperature >100.4   Complete by: As directed    Diet - low sodium heart healthy   Complete by: As directed    Discharge instructions   Complete by: As directed    You were cared for by a hospitalist during your hospital stay. If you have any questions about  levothyroxine 150 MCG tablet Commonly known as: SYNTHROID Take 75-150 mcg by mouth See admin instructions. Take 150 mcg by mouth in the morning 30 minutes before breakfast and ALTERNATE with 150 mcg EVERY OTHER DAY   metFORMIN 500 MG 24 hr tablet Commonly known as: GLUCOPHAGE-XR Take 2 tablets (1,000 mg total) by mouth 2 (two) times daily. What changed:  how much to take when to take this   Ozempic (0.25 or 0.5 MG/DOSE) 2 MG/3ML Sopn Generic drug: Semaglutide(0.25 or 0.5MG /DOS) Inject 0.5 mg into the skin See admin instructions. Inject 0.5 mg into the skin every 7-10 days   rosuvastatin 10 MG tablet Commonly known as: CRESTOR Take 10 mg by mouth daily.   spironolactone 25 MG tablet Commonly known as: ALDACTONE Take 25 mg by mouth daily.   tiZANidine 4 MG tablet Commonly known as: ZANAFLEX Take 2-4 mg by mouth every 8 (eight) hours as needed for muscle spasms.   traMADol 50 MG tablet Commonly known as: ULTRAM Take 50 mg by mouth daily as needed for moderate pain.   zolpidem 10 MG tablet Commonly known as: AMBIEN Take 10 mg by mouth at bedtime.        Follow-up Information     Ralene Ok, MD Follow up.    Specialty: Internal Medicine Contact information: [address] [place] [address] [phone]         Jodelle Red, MD Follow up.   Specialty: Cardiology Contact information: [address] [place] [address] [phone]                Allergies  Allergen Reactions   Sulfa Antibiotics Other (See Comments)    High fever and Body aches   Tape Other (See Comments)    TEARS OFF THE SKIN!!!!   Atorvastatin Other (See Comments)    Extreme muscle cramps and muscle aches   Astelin [Azelastine] Other (See Comments)    Nasal spray caused a burning sensation    Amoxicillin Rash and Other (See Comments)    Tolerated Ancef in 2023, received Rocephin inpatient & prescribed Omnicef outpatient prior to Amox allergy being documented.     Consultations: Cardiology   Procedures/Studies: ECHOCARDIOGRAM COMPLETE  Result Date: 10/22/2022    ECHOCARDIOGRAM REPORT   Patient Name:   [name] [facility] Date of Exam: 10/22/2022 Medical Rec #:  [id number]    Height:       62.0 in Accession #:    [id number]   Weight:       178.0 lb Date of Birth:  12/08/1950    BSA:          1.819 m Patient Age:    72 years     BP:           147/60 mmHg Patient Gender: F            HR:           68 bpm. Exam Location:  Inpatient Procedure: 2D Echo, Color Doppler, Cardiac Doppler and Intracardiac            Opacification Agent Indications:    Chest pain  History:        Patient has prior history of Echocardiogram examinations, most                 recent 09/23/2020. CHF, CAD, COPD, Signs/Symptoms:Murmur; Risk                 Factors:Hypertension and Diabetes. Sarcoidosis.  Sonographer:    Carollee Herter  Physician Discharge Summary  [name] ZOX:[id number] DOB: 07-05-1950 DOA: 10/21/2022  PCP: Ralene Ok, MD  Admit date: 10/21/2022 Discharge date: 10/22/2022  Admitted From: Home Disposition: Home  Recommendations for Outpatient Follow-up:  Follow up with PCP, cardiology  Discharge Condition: Stable CODE STATUS: Full code Diet recommendation: Heart healthy diet  Brief/Interim Summary: From H&P: [name] is a 72 y.o. female with medical history significant of pulmonary sarcoidosis, chronic diastolic heart failure, hypertension, hypothyroidism, COPD, CAD, NASH cirrhosis, hepatocellular carcinoma, thrombocytopenia who presents with chest pain.   Pt was sitting around noon today and developed acute onset of sharp mid lower anterior chest pain with radiation to her back, neck and up her left shoulder. Pain worse with inspiration and with laying down. Had nausea and no vomiting. Pain only improved after she arrived to ED and was given nitroglycerin and aspirin and is now down to a soreness pain of 2/10. Tried to take tums, pepto-bismol and muscle relaxer at home without relieve. She has chronic LE with right always being worse. No recent travel or immobilization. Had percutaneous bland embolization and microwave ablation for a liver lesion on 07/21/2022.    In the ED, she was afebrile hypertensive with BP of 160/56 on room air.  No leukocytosis or anemia.  Chronic thrombocytopenia remains stable at 112.   Had mild hyponatremia of 134, potassium of 4.3, creatinine of 0.73.  AST and ALT mildly elevated at 52 and 55 respectively which is similar to prior.   Troponins are reassuring and flat at 10, 9 and 5.  EKG on my review in sinus rhythm with nonspecific T wave abnormalities.   Cardiology was consulted.  She underwent CTA chest which was negative for PE.  Troponin remained negative.  Echocardiogram was unremarkable. She remained chest pain-free in the emergency department  Discharge  Diagnoses:   Principal Problem:   Chest pain Active Problems:   Hypothyroidism   HTN (hypertension)   Thrombocytopenia (HCC)   Liver cirrhosis secondary to NASH (HCC)   Hepatocellular carcinoma (HCC)   Controlled type 2 diabetes mellitus without complication, without long-term current use of insulin (HCC)   Chronic diastolic CHF (congestive heart failure) [facility])    Discharge Instructions  Discharge Instructions     (HEART FAILURE PATIENTS) Call MD:  Anytime you have any of the following symptoms: 1) 3 pound weight gain in 24 hours or 5 pounds in 1 week 2) shortness of breath, with or without a dry hacking cough 3) swelling in the hands, feet or stomach 4) if you have to sleep on extra pillows at night in order to breathe.   Complete by: As directed    Call MD for:  difficulty breathing, headache or visual disturbances   Complete by: As directed    Call MD for:  extreme fatigue   Complete by: As directed    Call MD for:  persistant dizziness or light-headedness   Complete by: As directed    Call MD for:  persistant nausea and vomiting   Complete by: As directed    Call MD for:  severe uncontrolled pain   Complete by: As directed    Call MD for:  temperature >100.4   Complete by: As directed    Diet - low sodium heart healthy   Complete by: As directed    Discharge instructions   Complete by: As directed    You were cared for by a hospitalist during your hospital stay. If you have any questions about  levothyroxine 150 MCG tablet Commonly known as: SYNTHROID Take 75-150 mcg by mouth See admin instructions. Take 150 mcg by mouth in the morning 30 minutes before breakfast and ALTERNATE with 150 mcg EVERY OTHER DAY   metFORMIN 500 MG 24 hr tablet Commonly known as: GLUCOPHAGE-XR Take 2 tablets (1,000 mg total) by mouth 2 (two) times daily. What changed:  how much to take when to take this   Ozempic (0.25 or 0.5 MG/DOSE) 2 MG/3ML Sopn Generic drug: Semaglutide(0.25 or 0.5MG /DOS) Inject 0.5 mg into the skin See admin instructions. Inject 0.5 mg into the skin every 7-10 days   rosuvastatin 10 MG tablet Commonly known as: CRESTOR Take 10 mg by mouth daily.   spironolactone 25 MG tablet Commonly known as: ALDACTONE Take 25 mg by mouth daily.   tiZANidine 4 MG tablet Commonly known as: ZANAFLEX Take 2-4 mg by mouth every 8 (eight) hours as needed for muscle spasms.   traMADol 50 MG tablet Commonly known as: ULTRAM Take 50 mg by mouth daily as needed for moderate pain.   zolpidem 10 MG tablet Commonly known as: AMBIEN Take 10 mg by mouth at bedtime.        Follow-up Information     Ralene Ok, MD Follow up.    Specialty: Internal Medicine Contact information: [address] [place] [address] [phone]         Jodelle Red, MD Follow up.   Specialty: Cardiology Contact information: [address] [place] [address] [phone]                Allergies  Allergen Reactions   Sulfa Antibiotics Other (See Comments)    High fever and Body aches   Tape Other (See Comments)    TEARS OFF THE SKIN!!!!   Atorvastatin Other (See Comments)    Extreme muscle cramps and muscle aches   Astelin [Azelastine] Other (See Comments)    Nasal spray caused a burning sensation    Amoxicillin Rash and Other (See Comments)    Tolerated Ancef in 2023, received Rocephin inpatient & prescribed Omnicef outpatient prior to Amox allergy being documented.     Consultations: Cardiology   Procedures/Studies: ECHOCARDIOGRAM COMPLETE  Result Date: 10/22/2022    ECHOCARDIOGRAM REPORT   Patient Name:   [name] [facility] Date of Exam: 10/22/2022 Medical Rec #:  [id number]    Height:       62.0 in Accession #:    [id number]   Weight:       178.0 lb Date of Birth:  12/08/1950    BSA:          1.819 m Patient Age:    72 years     BP:           147/60 mmHg Patient Gender: F            HR:           68 bpm. Exam Location:  Inpatient Procedure: 2D Echo, Color Doppler, Cardiac Doppler and Intracardiac            Opacification Agent Indications:    Chest pain  History:        Patient has prior history of Echocardiogram examinations, most                 recent 09/23/2020. CHF, CAD, COPD, Signs/Symptoms:Murmur; Risk                 Factors:Hypertension and Diabetes. Sarcoidosis.  Sonographer:    Carollee Herter

## 2022-10-22 NOTE — ED Notes (Signed)
ED TO INPATIENT HANDOFF REPORT  Name/Age/Gender [name] 72 y.o. female  Code Status    Code Status Orders  (From admission, onward)           Start     Ordered   10/21/22 2203  Full code  Continuous       Question:  By:  Answer:  Consent: discussion documented in EHR   10/21/22 2203           Code Status History     Date Active Date Inactive Code Status Order ID Comments User Context   07/21/2022 1352 07/24/2022 1509 Full Code [id number]  Simonne Come, MD Inpatient   07/21/2022 1352 07/21/2022 1352 Full Code [id number]  Simonne Come, MD Inpatient   05/19/2021 1324 05/20/2021 2129 Full Code [id number]  Cordella Register Inpatient   09/23/2020 0145 09/25/2020 2035 Full Code [id number]  Briscoe Deutscher, MD ED   06/22/2012 1842 06/23/2012 1903 Full Code [id number]  Oti, Osie Cheeks, MD Inpatient       Home/SNF/Other Home  Chief Complaint Chest pain [R07.9]  Level of Care/Admitting Diagnosis ED Disposition     ED Disposition  Admit   Condition  --   Comment  Hospital Area: [facility] [[id number]]  Level of Care: Telemetry [5]  Admit to tele based on following criteria: Monitor for Ischemic changes  May place patient in observation at [facility] or [facility] if equivalent level of care is available:: No  Covid Evaluation: Asymptomatic - no recent exposure (last 10 days) testing not required  Diagnosis: Chest pain [744799]  Admitting Physician: Anselm Jungling [[id number]]  Attending Physician: Anselm Jungling [[id number]]          Medical History Past Medical History:  Diagnosis Date   Anxiety    Arthritis    Asthma    Blood dyscrasia    Thrombocytopenia   CAD (coronary artery disease)    CHF (congestive heart failure) (HCC)    Complication of anesthesia    trouble waking up once with Nissen Fundoplication   COPD (chronic obstructive pulmonary disease) (HCC)    Depression    Diabetes mellitus without complication (HCC)    Dysrhythmia    Heart murmur     hepatocellular ca    Hypertension    Hypothyroidism    Liver disease    NAFLD cirrhosis   Mild aortic stenosis    Pneumonia    Sarcoidosis    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   Atorvastatin Other (See Comments)    Extreme muscle cramps and muscle aches   Astelin [Azelastine] Other (See Comments)    Nasal spray caused a burning sensation    Amoxicillin Rash and Other (See Comments)    Tolerated Ancef in 2023, received Rocephin inpatient & prescribed Omnicef outpatient prior to Amox allergy being documented.     IV Location/Drains/Wounds Patient Lines/Drains/Airways Status     Active Line/Drains/Airways     Name Placement date Placement time Site Days   Peripheral IV 10/21/22 20 G 1" Anterior;Left;Proximal Forearm 10/21/22  1644  Forearm  1            Labs/Imaging Results for orders placed or performed during the hospital encounter of 10/21/22 (  from the past 48 hour(s))  Basic metabolic panel     Status: Abnormal   Collection Time: 10/21/22  4:55 PM  Result Value Ref Range   Sodium 134 (L) 135 - 145 mmol/L   Potassium 4.3 3.5 - 5.1 mmol/L   Chloride 101 98 - 111 mmol/L   CO2 22 22 - 32 mmol/L   Glucose, Bld 128 (H) 70 - 99 mg/dL    Comment: Glucose reference range applies only to samples taken after fasting for at least 8 hours.   BUN 32 (H) 8 - 23 mg/dL   Creatinine, Ser 1.61 0.44 - 1.00 mg/dL   Calcium 9.7 8.9 - 09.6 mg/dL   GFR, Estimated >04 >54 mL/min    Comment: (NOTE) Calculated using the CKD-EPI Creatinine Equation (2021)    Anion gap 11 5 - 15    Comment: Performed at [facility], [address], [place], [place] [address]  CBC     Status: Abnormal   Collection Time: 10/21/22  4:55 PM  Result Value Ref Range   WBC 8.0 4.0 - 10.5 K/uL    RBC 4.57 3.87 - 5.11 MIL/uL   Hemoglobin 13.2 12.0 - 15.0 g/dL   HCT 91.4 78.2 - 95.6 %   MCV 89.5 80.0 - 100.0 fL   MCH 28.9 26.0 - 34.0 pg   MCHC 32.3 30.0 - 36.0 g/dL   RDW 21.3 08.6 - 57.8 %   Platelets 112 (L) 150 - 400 K/uL   nRBC 0.0 0.0 - 0.2 %    Comment: Performed at [facility], [address] [place], [place] [address]  Troponin I (High Sensitivity)     Status: None   Collection Time: 10/21/22  4:55 PM  Result Value Ref Range   Troponin I (High Sensitivity) 10 <18 ng/L    Comment: (NOTE) Elevated high sensitivity troponin I (hsTnI) values and significant  changes across serial measurements may suggest ACS but many other  chronic and acute conditions are known to elevate hsTnI results.  Refer to the "Links" section for chest pain algorithms and additional  guidance. Performed at [facility], [address], [place], [place] [address]   Brain natriuretic peptide     Status: None   Collection Time: 10/21/22  4:55 PM  Result Value Ref Range   B Natriuretic Peptide 77.3 0.0 - 100.0 pg/mL    Comment: Performed at [facility], [address] [place], [place] [address]  Hepatic function panel     Status: Abnormal   Collection Time: 10/21/22  4:55 PM  Result Value Ref Range   Total Protein 7.7 6.5 - 8.1 g/dL   Albumin 3.7 3.5 - 5.0 g/dL   AST 52 (H) 15 - 41 U/L   ALT 55 (H) 0 - 44 U/L   Alkaline Phosphatase 118 38 - 126 U/L   Total Bilirubin 0.6 0.3 - 1.2 mg/dL   Bilirubin, Direct 0.1 0.0 - 0.2 mg/dL   Indirect Bilirubin 0.5 0.3 - 0.9 mg/dL    Comment: Performed at [facility], [address] [place], [place] [address]  Magnesium     Status: None   Collection Time: 10/21/22  4:55 PM  Result Value Ref Range   Magnesium 2.0 1.7 - 2.4 mg/dL    Comment: Performed at [facility], [address], [place], [place] [address]  Troponin I (High Sensitivity)     Status: None    Collection  Time: 10/21/22  4:55 PM  Result Value Ref Range   Troponin I (High Sensitivity) 9 <18 ng/L    Comment: (NOTE) Elevated high sensitivity troponin I (hsTnI) values and significant  changes across serial measurements may suggest ACS but many other  chronic and acute conditions are known to elevate hsTnI results.  Refer to the "Links" section for chest pain algorithms and additional  guidance. Performed at [facility], [address] [place], [place] [address]   Troponin I (High Sensitivity)     Status: None   Collection Time: 10/21/22  6:50 PM  Result Value Ref Range   Troponin I (High Sensitivity) 5 <18 ng/L    Comment: (NOTE) Elevated high sensitivity troponin I (hsTnI) values and significant  changes across serial measurements may suggest ACS but many other  chronic and acute conditions are known to elevate hsTnI results.  Refer to the "Links" section for chest pain algorithms and additional  guidance. Performed at [facility], [address] [place], [place] [address]   Lipase, blood     Status: None   Collection Time: 10/21/22 10:07 PM  Result Value Ref Range   Lipase 41 11 - 51 U/L    Comment: Performed at [facility], [address], [place], [place] [address]  CBG monitoring, ED     Status: Abnormal   Collection Time: 10/22/22  8:52 AM  Result Value Ref Range   Glucose-Capillary 110 (H) 70 - 99 mg/dL    Comment: Glucose reference range applies only to samples taken after fasting for at least 8 hours.  Sedimentation rate     Status: None   Collection Time: 10/22/22  9:30 AM  Result Value Ref Range   Sed Rate 15 0 - 22 mm/hr    Comment: Performed at [facility], [address] [place], [place] [address]  CBG monitoring, ED     Status: None   Collection Time: 10/22/22 11:47 AM  Result Value Ref Range   Glucose-Capillary 95 70 - 99 mg/dL    Comment: Glucose reference range  applies only to samples taken after fasting for at least 8 hours.   CT Angio Chest Pulmonary Embolism (PE) W or WO Contrast  Result Date: 10/21/2022 CLINICAL DATA:  Nontraumatic chest wall pain. Pleuritic chest pain. History of hepatocellular carcinoma. EXAM: CT ANGIOGRAPHY CHEST WITH CONTRAST TECHNIQUE: Multidetector CT imaging of the chest was performed using the standard protocol during bolus administration of intravenous contrast. Multiplanar CT image reconstructions and MIPs were obtained to evaluate the vascular anatomy. RADIATION DOSE REDUCTION: This exam was performed according to the departmental dose-optimization program which includes automated exposure control, adjustment of the mA and/or kV according to patient size and/or use of iterative reconstruction technique. CONTRAST:  80mL OMNIPAQUE IOHEXOL 350 MG/ML SOLN COMPARISON:  07/27/2022 FINDINGS: Cardiovascular: Good opacification of the central and segmental pulmonary arteries with mild motion artifact. No focal filling defects are demonstrated. No evidence of significant pulmonary embolus. Cardiac enlargement. No pericardial effusions. Normal caliber thoracic aorta. No aortic dissection. Calcification of the aorta and coronary arteries. Mediastinum/Nodes: Small esophageal hiatal hernia. Esophagus is decompressed. Scattered lymph nodes are in the mediastinum, largest right paratracheal nodes measuring 1.4 cm short axis dimension. No change since prior study, nonspecific but most likely reactive. Prominent diffuse enlargement of the thyroid gland is unchanged since prior study. No focal nodularity is identified consistent with thyroid goiter. Lungs/Pleura: Motion artifact limits examination. There is atelectasis or consolidation  in both lung bases. No pleural effusions. No pneumothorax. No focal lung nodules. Upper Abdomen: Hepatic cirrhosis with splenic enlargement. Known neoplasm is not demonstrated on this early arterial phase. Musculoskeletal:  No focal bone lesions. Review of the MIP images confirms the above findings. IMPRESSION: 1. No evidence of significant pulmonary embolus. 2. Atelectasis or infiltration in both lung bases. 3. Cardiac enlargement.  Aortic atherosclerosis. 4. Small esophageal hiatal hernia. 5. Hepatic cirrhosis with enlarged spleen. 6. Diffuse thyroid enlargement consistent with goiter. No change since prior study. This has been evaluated on previous imaging. (ref: J Am Coll Radiol. 2015 Feb;12(2): 143-50). Electronically Signed   By: Burman Nieves M.D.   On: 10/21/2022 23:00   DG Chest 2 View  Result Date: 10/21/2022 CLINICAL DATA:  Chest pain EXAM: CHEST - 2 VIEW COMPARISON:  07/26/2022 x-ray and CT angiogram FINDINGS: Underinflation. Enlarged cardiopericardial silhouette. Linear opacity lung left lung base likely scar or atelectasis. No pneumothorax, effusion or edema. Calcified aorta. Overlapping cardiac leads. IMPRESSION: Enlarged cardiopericardial silhouette.  Calcified aorta. Linear opacity left mid to lower lung, likely scar or atelectasis. Recommend follow-up Electronically Signed   By: Karen Kays M.D.   On: 10/21/2022 17:48    Pending Labs Unresulted Labs (From admission, onward)     Start     Ordered   10/22/22 0916  C-reactive protein  Once,   R        10/22/22 0916            Vitals/Pain Today's Vitals   10/22/22 1034 10/22/22 1100 10/22/22 1200 10/22/22 1300  BP:  (!) 130/50 (!) 136/46 138/60  Pulse:  63 69 71  Resp:  15 15 13   Temp: 98.7 F (37.1 C)     TempSrc: Oral     SpO2:  93% 96% 95%  Weight:      Height:      PainSc:        Isolation Precautions No active isolations  Medications Medications  nitroGLYCERIN (NITROSTAT) SL tablet 0.4 mg (0.4 mg Sublingual Given 10/21/22 1611)  acetaminophen (TYLENOL) tablet 650 mg (has no administration in time range)  aspirin EC tablet 81 mg (81 mg Oral Given 10/22/22 0907)  amLODipine (NORVASC) tablet 10 mg (10 mg Oral Patient  Refused/Not Given 10/22/22 0908)  carvedilol (COREG) tablet 25 mg (25 mg Oral Given 10/22/22 0900)  furosemide (LASIX) tablet 20 mg (20 mg Oral Given 10/22/22 0911)  irbesartan (AVAPRO) tablet 150 mg (has no administration in time range)  rosuvastatin (CRESTOR) tablet 10 mg (10 mg Oral Given 10/22/22 0910)  spironolactone (ALDACTONE) tablet 25 mg (has no administration in time range)  ALPRAZolam (XANAX) tablet 0.25 mg (has no administration in time range)  zolpidem (AMBIEN) tablet 5 mg (5 mg Oral Given 10/21/22 2308)  levothyroxine (SYNTHROID) tablet 150 mcg (150 mcg Oral Given 10/22/22 0545)  docusate sodium (COLACE) capsule 200 mg (200 mg Oral Given 10/22/22 0907)  gabapentin (NEURONTIN) capsule 300 mg (300 mg Oral Given 10/22/22 0908)  fluticasone (FLONASE) 50 MCG/ACT nasal spray 1 spray (1 spray Each Nare Not Given 10/22/22 0102)  albuterol (PROVENTIL) (2.5 MG/3ML) 0.083% nebulizer solution 3 mL (has no administration in time range)  ondansetron (ZOFRAN) injection 4 mg (has no administration in time range)  enoxaparin (LOVENOX) injection 40 mg (40 mg Subcutaneous Given 10/22/22 0912)  insulin aspart (novoLOG) injection 0-6 Units ( Subcutaneous Not Given 10/22/22 1149)  levothyroxine (SYNTHROID) tablet 75 mcg (has no administration in time range)  furosemide (LASIX) injection 20  mg (20 mg Intravenous Given 10/21/22 2120)  iohexol (OMNIPAQUE) 350 MG/ML injection 80 mL (80 mLs Intravenous Contrast Given 10/21/22 2242)    Mobility walks

## 2022-10-22 NOTE — Progress Notes (Signed)
  Echocardiogram 2D Echocardiogram has been performed.  [name] 10/22/2022, 4:27 PM

## 2022-10-28 ENCOUNTER — Ambulatory Visit (HOSPITAL_COMMUNITY)
Admission: RE | Admit: 2022-10-28 | Discharge: 2022-10-28 | Disposition: A | Discharge status: Home / Self Care | Payer: Medicare Other | Source: Ambulatory Visit | Attending: Hematology and Oncology | Admitting: Hematology and Oncology

## 2022-10-28 DIAGNOSIS — C220 Liver cell carcinoma: Secondary | ICD-10-CM | POA: Diagnosis present

## 2022-10-28 MED ORDER — GADOBUTROL 1 MMOL/ML IV SOLN
8.0000 mL | Freq: Once | INTRAVENOUS | Status: AC | PRN
  Administered 2022-10-28: 8 mL via INTRAVENOUS

## 2022-10-29 ENCOUNTER — Other Ambulatory Visit (HOSPITAL_BASED_OUTPATIENT_CLINIC_OR_DEPARTMENT_OTHER): Payer: Self-pay | Admitting: Cardiology

## 2022-10-29 DIAGNOSIS — I10 Essential (primary) hypertension: Secondary | ICD-10-CM

## 2022-10-29 DIAGNOSIS — I3489 Other nonrheumatic mitral valve disorders: Secondary | ICD-10-CM

## 2022-10-29 DIAGNOSIS — I2510 Atherosclerotic heart disease of native coronary artery without angina pectoris: Secondary | ICD-10-CM

## 2022-11-02 ENCOUNTER — Telehealth (HOSPITAL_BASED_OUTPATIENT_CLINIC_OR_DEPARTMENT_OTHER): Payer: Self-pay | Admitting: Cardiology

## 2022-11-02 NOTE — Telephone Encounter (Signed)
  Patient would like Dr Cristal Deer to look over her scan she had done in the ER regarding mitral valve regurgitation.

## 2022-11-02 NOTE — Telephone Encounter (Signed)
Please review echo done 10/4 in ED and advise

## 2022-11-10 ENCOUNTER — Inpatient Hospital Stay: Payer: Medicare Other | Attending: Hematology and Oncology

## 2022-11-10 ENCOUNTER — Other Ambulatory Visit: Payer: Self-pay

## 2022-11-10 ENCOUNTER — Inpatient Hospital Stay (HOSPITAL_BASED_OUTPATIENT_CLINIC_OR_DEPARTMENT_OTHER): Payer: Medicare Other | Admitting: Hematology and Oncology

## 2022-11-10 ENCOUNTER — Other Ambulatory Visit: Payer: Self-pay | Admitting: *Deleted

## 2022-11-10 VITALS — BP 147/58 | HR 65 | Temp 97.8°F | Resp 13 | Wt 181.3 lb

## 2022-11-10 DIAGNOSIS — C220 Liver cell carcinoma: Secondary | ICD-10-CM | POA: Insufficient documentation

## 2022-11-10 DIAGNOSIS — D696 Thrombocytopenia, unspecified: Secondary | ICD-10-CM | POA: Diagnosis not present

## 2022-11-10 DIAGNOSIS — D6959 Other secondary thrombocytopenia: Secondary | ICD-10-CM | POA: Diagnosis not present

## 2022-11-10 LAB — CBC WITH DIFFERENTIAL (CANCER CENTER ONLY)
Abs Immature Granulocytes: 0.01 10*3/uL (ref 0.00–0.07)
Basophils Absolute: 0 10*3/uL (ref 0.0–0.1)
Basophils Relative: 1 %
Eosinophils Absolute: 0.2 10*3/uL (ref 0.0–0.5)
Eosinophils Relative: 6 %
HCT: 39.7 % (ref 36.0–46.0)
Hemoglobin: 13 g/dL (ref 12.0–15.0)
Immature Granulocytes: 0 %
Lymphocytes Relative: 18 %
Lymphs Abs: 0.6 10*3/uL — ABNORMAL LOW (ref 0.7–4.0)
MCH: 28.5 pg (ref 26.0–34.0)
MCHC: 32.7 g/dL (ref 30.0–36.0)
MCV: 87.1 fL (ref 80.0–100.0)
Monocytes Absolute: 0.3 10*3/uL (ref 0.1–1.0)
Monocytes Relative: 10 %
Neutro Abs: 2.2 10*3/uL (ref 1.7–7.7)
Neutrophils Relative %: 65 %
Platelet Count: 86 10*3/uL — ABNORMAL LOW (ref 150–400)
RBC: 4.56 MIL/uL (ref 3.87–5.11)
RDW: 13 % (ref 11.5–15.5)
WBC Count: 3.3 10*3/uL — ABNORMAL LOW (ref 4.0–10.5)
nRBC: 0 % (ref 0.0–0.2)

## 2022-11-10 LAB — CMP (CANCER CENTER ONLY)
ALT: 58 U/L — ABNORMAL HIGH (ref 0–44)
AST: 59 U/L — ABNORMAL HIGH (ref 15–41)
Albumin: 3.8 g/dL (ref 3.5–5.0)
Alkaline Phosphatase: 132 U/L — ABNORMAL HIGH (ref 38–126)
Anion gap: 4 — ABNORMAL LOW (ref 5–15)
BUN: 26 mg/dL — ABNORMAL HIGH (ref 8–23)
CO2: 27 mmol/L (ref 22–32)
Calcium: 10 mg/dL (ref 8.9–10.3)
Chloride: 109 mmol/L (ref 98–111)
Creatinine: 0.68 mg/dL (ref 0.44–1.00)
GFR, Estimated: >60 mL/min (ref >60)
Glucose, Bld: 123 mg/dL — ABNORMAL HIGH (ref 70–99)
Potassium: 4.9 mmol/L (ref 3.5–5.1)
Sodium: 140 mmol/L (ref 135–145)
Total Bilirubin: 0.5 mg/dL (ref 0.3–1.2)
Total Protein: 7.5 g/dL (ref 6.5–8.1)

## 2022-11-10 NOTE — Progress Notes (Signed)
[facility] [phone]) [phone]   [phone]  PROGRESS NOTE  Patient Care Team: Ralene Ok, MD as PCP - General (Internal Medicine) Jodelle Red, MD as PCP - Cardiology (Cardiology) Jaci Standard, MD as Consulting Physician (Hematology and Oncology)  Hematological/Oncological History # Hepatocellular Carcinoma, Stage 1b 04/13/2022: Patient underwent a CT scan of the chest wall emergency department for hemoptysis.  The scan showed no clear source of the bleeding but the report noted hepatic cirrhosis with enlarging heterogeneous enhancing lesion centrally in the right hepatic lobe, suspicious for hepatocellular carcinoma. Recommend further evaluation with abdominal MRI without and with contrast. 04/22/2022: MRI abdomen performed which showed. 3.1 x 3.1 cm early arterial phase enhancing lesion in segment 8 with subsequent washout and capsular enhancement. Findings consistent with hepatocellular carcinoma, LI-RADS 5 07/05/2022: started evaluation for liver transplant at [facility].  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 Leonides Schanz     Interval History:  [name] 72 y.o. female with medical history significant for newly diagnosed [facility] who presents for a follow up visit. The patient's last visit was on 08/06/2022. In the interim since the last visit she underwent an MRI which showed cirrhosis and a 2.8 cm ablation zone without evidence of viable tumor.  On exam today [name] reports that she has been well overall in the interim since her last visit.  She reports her energy levels are about a 7 or 8 out of 10.  Her appetite has been strong and  she has had no runny nose, sore throat, or cough.  She reports that she is not currently having any pain or nausea, vomiting, or diarrhea.  She reports that she is delighted to be in remission.  She is doing her best to try to continue to eat well and continues to follow with the liver transplant team.  She has not started any new medications or had any hospitalizations or emergency room visits.  She has had no other major changes in her health.  She notes she is not having any abdominal discomfort and denies any nausea, vomiting, or diarrhea.  A full 10 point ROS was otherwise negative.  MEDICAL HISTORY:  Past Medical History:  Diagnosis Date   Anxiety    Arthritis    Asthma    Blood dyscrasia    Thrombocytopenia   CAD (coronary artery disease)    CHF (congestive heart failure) (HCC)    Complication of anesthesia    trouble waking up once with Nissen Fundoplication   COPD (chronic obstructive pulmonary disease) (HCC)    Depression    Diabetes mellitus without complication (HCC)    Dysrhythmia    Heart murmur    hepatocellular ca    Hypertension    Hypothyroidism    Liver disease    NAFLD cirrhosis   Mild aortic stenosis    Pneumonia    Sarcoidosis    Sarcoidosis of lung (HCC)    Sarcoidosis of lymph nodes     SURGICAL HISTORY: Past Surgical History:  Procedure Laterality Date   ABDOMINAL HYSTERECTOMY     ABDOMINAL SURGERY     CHOLECYSTECTOMY     ESOPHAGOGASTRODUODENOSCOPY (EGD) WITH PROPOFOL N/A 04/17/2021   Procedure:  ESOPHAGOGASTRODUODENOSCOPY (EGD) WITH PROPOFOL;  Surgeon: Jeani Hawking, MD;  Location: [facility] ENDOSCOPY;  Service: Gastroenterology;  Laterality: N/A;   HERNIA REPAIR     IR 3D INDEPENDENT WKST  07/21/2022   IR ANGIOGRAM SELECTIVE EACH ADDITIONAL VESSEL  07/21/2022   IR ANGIOGRAM VISCERAL SELECTIVE  07/21/2022   IR EMBO TUMOR ORGAN ISCHEMIA INFARCT INC GUIDE ROADMAPPING  07/21/2022   IR GUIDED LIVER TUMOR ABLATION RFA PERC  07/21/2022   IR US GUIDE VASC ACCESS RIGHT   07/21/2022   LASIK  2001   LEFT HEART CATH AND CORONARY ANGIOGRAPHY N/A 09/23/2020   Procedure: LEFT HEART CATH AND CORONARY ANGIOGRAPHY;  Surgeon: Lennette Bihari, MD;  Location: [facility] INVASIVE CV LAB;  Service: Cardiovascular;  Laterality: N/A;   nissenfundiplication     RADIOLOGY WITH ANESTHESIA N/A 07/21/2022   Procedure: IR WITH ANESTHESIA MICROWAVE ABLATION;  Surgeon: Simonne Come, MD;  Location: [facility] ORS;  Service: Anesthesiology;  Laterality: N/A;   TOTAL KNEE ARTHROPLASTY Right 05/19/2021   Procedure: TOTAL KNEE ARTHROPLASTY;  Surgeon: Durene Romans, MD;  Location: [facility] ORS;  Service: Orthopedics;  Laterality: Right;    SOCIAL HISTORY: Social History   Socioeconomic History   Marital status: Married    Spouse name: Not on file   Number of children: Not on file   Years of education: Not on file   Highest education level: Not on file  Occupational History   Not on file  Tobacco Use   Smoking status: Never    Passive exposure: Past (minimal)   Smokeless tobacco: Never  Vaping Use   Vaping status: Never Used  Substance and Sexual Activity   Alcohol use: No   Drug use: No   Sexual activity: Not Currently  Other Topics Concern   Not on file  Social History Narrative   Not on file   Social Determinants of Health   Financial Resource Strain: Not on file  Food Insecurity: No Food Insecurity (07/24/2022)   Hunger Vital Sign    Worried About Running Out of Food in the Last Year: Never true    Ran Out of Food in the Last Year: Never true  Transportation Needs: No Transportation Needs (07/24/2022)   PRAPARE - Administrator, Civil Service (Medical): No    Lack of Transportation (Non-Medical): No  Physical Activity: Not on file  Stress: Not on file  Social Connections: Not on file  Intimate Partner Violence: Not At Risk (07/24/2022)   Humiliation, Afraid, Rape, and Kick questionnaire    Fear of Current or Ex-Partner: No    Emotionally Abused: No    Physically Abused: No     Sexually Abused: No    FAMILY HISTORY: Family History  Problem Relation Age of Onset   Diabetes Mother    Heart attack Mother    Heart failure Father    Hypertension Father    Heart attack Father    Heart Problems Sister    Blindness Sister    Diabetes Sister    Hypertension Sister    Heart attack Brother    Heart attack Brother    Throat cancer Brother    Healthy Son    Rheum arthritis Daughter     ALLERGIES:  is allergic to sulfa antibiotics, tape, atorvastatin, astelin [azelastine], and amoxicillin.  MEDICATIONS:  Current Outpatient Medications  Medication Sig Dispense Refill   acetaminophen (TYLENOL) 325 MG tablet Take 325-650 mg by mouth every 6 (six) hours as needed for mild pain, moderate pain or  headache.     acyclovir (ZOVIRAX) 400 MG tablet Take 400 mg by mouth every 8 (eight) hours as needed (fever blisters). Fever blisters     albuterol (VENTOLIN HFA) 108 (90 Base) MCG/ACT inhaler Inhale 2 puffs into the lungs every 6 (six) hours as needed. (Patient taking differently: Inhale 2 puffs into the lungs every 6 (six) hours as needed for shortness of breath or wheezing.) 18 g 5   ALPRAZolam (XANAX) 0.25 MG tablet Take 0.25 mg by mouth 3 (three) times daily as needed for anxiety.     amLODipine (NORVASC) 10 MG tablet Take 1 tablet (10 mg total) by mouth daily. (Patient taking differently: Take 10 mg by mouth daily as needed (if the Systolic number is 150 or greater).) 90 tablet 3   aspirin EC 81 MG tablet Take 81 mg by mouth daily. Swallow whole.     Biotin 10 MG CAPS Take 10 mg by mouth daily.     carvedilol (COREG) 25 MG tablet TAKE 1 TABLET BY MOUTH TWICE DAILY WITH A MEAL 180 tablet 3   cetirizine (ZYRTEC) 10 MG tablet Take 10 mg by mouth daily.     docusate sodium (COLACE) 100 MG capsule Take 1 capsule (100 mg total) by mouth 2 (two) times daily. (Patient taking differently: Take 200 mg by mouth daily.) 10 capsule 0   fluticasone (FLONASE) 50 MCG/ACT nasal spray Place 1  spray into both nostrils daily. (Patient taking differently: Place 1 spray into both nostrils at bedtime.) 16 g 11   furosemide (LASIX) 20 MG tablet Take 20-40 mg by mouth See admin instructions. Take 20 mg by mouth in the morning and increase dose to 40 mg for swelling or edema     gabapentin (NEURONTIN) 300 MG capsule Take 300 mg by mouth 2 (two) times daily.     irbesartan (AVAPRO) 150 MG tablet Take 1 tablet by mouth once daily 90 tablet 1   levothyroxine (SYNTHROID, LEVOTHROID) 150 MCG tablet Take 75-150 mcg by mouth See admin instructions. Take 150 mcg by mouth in the morning 30 minutes before breakfast and ALTERNATE with 150 mcg EVERY OTHER DAY     metFORMIN (GLUCOPHAGE-XR) 500 MG 24 hr tablet Take 2 tablets (1,000 mg total) by mouth 2 (two) times daily. (Patient taking differently: Take 500 mg by mouth daily with breakfast.) 120 tablet 3   rosuvastatin (CRESTOR) 10 MG tablet Take 10 mg by mouth daily.     Semaglutide,0.25 or 0.5MG /DOS, (OZEMPIC, 0.25 OR 0.5 MG/DOSE,) 2 MG/3ML SOPN Inject 0.5 mg into the skin See admin instructions. Inject 0.5 mg into the skin every 7-10 days     spironolactone (ALDACTONE) 25 MG tablet Take 25 mg by mouth daily.     tiZANidine (ZANAFLEX) 4 MG tablet Take 2-4 mg by mouth every 8 (eight) hours as needed for muscle spasms.     traMADol (ULTRAM) 50 MG tablet Take 50 mg by mouth daily as needed for moderate pain.     zolpidem (AMBIEN) 10 MG tablet Take 10 mg by mouth at bedtime.     No current facility-administered medications for this visit.    REVIEW OF SYSTEMS:   Constitutional: ( - ) fevers, ( - )  chills , ( - ) night sweats Eyes: ( - ) blurriness of vision, ( - ) double vision, ( - ) watery eyes Ears, nose, mouth, throat, and face: ( - ) mucositis, ( - ) sore throat Respiratory: ( - ) cough, ( - ) dyspnea, ( - )  wheezes Cardiovascular: ( - ) palpitation, ( - ) chest discomfort, ( - ) lower extremity swelling Gastrointestinal:  ( - ) nausea, ( - )  heartburn, ( - ) change in bowel habits Skin: ( - ) abnormal skin rashes Lymphatics: ( - ) new lymphadenopathy, ( - ) easy bruising Neurological: ( - ) numbness, ( - ) tingling, ( - ) new weaknesses Behavioral/Psych: ( - ) mood change, ( - ) new changes  All other systems were reviewed with the patient and are negative.  PHYSICAL EXAMINATION: ECOG PERFORMANCE STATUS: 0 - Asymptomatic  Vitals:   11/10/22 1117  BP: (!) 147/58  Pulse: 65  Resp: 13  Temp: 97.8 F (36.6 C)  SpO2: 96%     Filed Weights   11/10/22 1117  Weight: 181 lb 4.8 oz (82.2 kg)      GENERAL: Well-appearing elderly Caucasian female, alert, no distress and comfortable SKIN: skin color, texture, turgor are normal, no rashes or significant lesions EYES: conjunctiva are pink and non-injected, sclera clear LUNGS: clear to auscultation and percussion with normal breathing effort HEART: regular rate & rhythm and no murmurs and no lower extremity edema Musculoskeletal: no cyanosis of digits and no clubbing  PSYCH: alert & oriented x 3, fluent speech NEURO: no focal motor/sensory deficits  LABORATORY DATA:  I have reviewed the data as listed    Latest Ref Rng & Units 11/10/2022   10:41 AM 10/21/2022    4:55 PM 08/06/2022    2:33 PM  CBC  WBC 4.0 - 10.5 K/uL 3.3  8.0  4.1   Hemoglobin 12.0 - 15.0 g/dL 78.2  95.6  21.3   Hematocrit 36.0 - 46.0 % 39.7  40.9  34.3   Platelets 150 - 400 K/uL 86  112  106        Latest Ref Rng & Units 11/10/2022   10:41 AM 10/21/2022    4:55 PM 08/06/2022    2:33 PM  CMP  Glucose 70 - 99 mg/dL 086  578  84   BUN 8 - 23 mg/dL 26  32  13   Creatinine 0.44 - 1.00 mg/dL 4.69  6.29  5.28   Sodium 135 - 145 mmol/L 140  134  139   Potassium 3.5 - 5.1 mmol/L 4.9  4.3  4.3   Chloride 98 - 111 mmol/L 109  101  109   CO2 22 - 32 mmol/L 27  22  26    Calcium 8.9 - 10.3 mg/dL 41.3  9.7  9.5   Total Protein 6.5 - 8.1 g/dL 7.5  7.7  6.7   Total Bilirubin 0.3 - 1.2 mg/dL 0.5  0.6  1.0    Alkaline Phos 38 - 126 U/L 132  118  98   AST 15 - 41 U/L 59  52  39   ALT 0 - 44 U/L 58  55  32     RADIOGRAPHIC STUDIES: MR LIVER W WO CONTRAST  Result Date: 11/07/2022 CLINICAL DATA:  Hepatocellular carcinoma, status post liver ablation EXAM: MRI ABDOMEN WITHOUT AND WITH CONTRAST TECHNIQUE: Multiplanar multisequence MR imaging of the abdomen was performed both before and after the administration of intravenous contrast. CONTRAST:  8mL GADAVIST GADOBUTROL 1 MMOL/ML IV SOLN COMPARISON:  CT abdomen dated 09/15/2022 FINDINGS: Lower chest: Lung bases are clear. Hepatobiliary: Cirrhosis. Mild hepatic steatosis. Ablation zone in segment 8 measures 2.8 x 2.2 cm (series 19/image 17), without enhancement following contrast administration to suggest viable tumor. Additional 10 mm lesion  in segment 3 (series 10/image 48), without enhancement following contrast administration, suggesting a small regenerative nodule. Status post cholecystectomy. No intrahepatic or extrahepatic ductal dilatation. Pancreas:  Within normal limits. Spleen:  At the upper limits of normal in size. Adrenals/Urinary Tract:  Adrenal glands are within normal limits. 8 mm hemorrhagic cyst in the anterior right upper kidney (series 4/image 21), benign (Bosniak II). No follow-up is recommended. Left kidney is within normal limits. No hydronephrosis. Stomach/Bowel: Stomach is notable for a small hiatal hernia. Visualized bowel is grossly unremarkable. Vascular/Lymphatic:  No evidence of abdominal aortic aneurysm. Small upper abdominal nodes, including a 10 mm short axis node in the porta hepatis (series 12/image 38), likely reactive. Other:  No abdominal ascites. Musculoskeletal: No focal osseous lesions. IMPRESSION: Cirrhosis. 2.8 cm ablation zone in segment 8, without evidence of viable tumor. No findings suspicious for recurrent or metastatic HCC. Electronically Signed   By: Charline Bills M.D.   On: 11/07/2022 01:31   ECHOCARDIOGRAM  COMPLETE  Result Date: 10/22/2022    ECHOCARDIOGRAM REPORT   Patient Name:   [name] [facility] Date of Exam: 10/22/2022 Medical Rec #:  [id number]    Height:       62.0 in Accession #:    [id number]   Weight:       178.0 lb Date of Birth:  Dec 26, 1950    BSA:          1.819 m Patient Age:    72 years     BP:           147/60 mmHg Patient Gender: F            HR:           68 bpm. Exam Location:  Inpatient Procedure: 2D Echo, Color Doppler, Cardiac Doppler and Intracardiac            Opacification Agent Indications:    Chest pain  History:        Patient has prior history of Echocardiogram examinations, most                 recent 09/23/2020. CHF, CAD, COPD, Signs/Symptoms:Murmur; Risk                 Factors:Hypertension and Diabetes. Sarcoidosis.  Sonographer:    Milda Smart Referring Phys: [id number] JENNIFER CHOI  Sonographer Comments: Image acquisition challenging due to patient body habitus. IMPRESSIONS  1. Left ventricular ejection fraction, by estimation, is 65 to 70%. The left ventricle has normal function. The left ventricle has no regional wall motion abnormalities. There is moderate concentric left ventricular hypertrophy. Left ventricular diastolic function could not be evaluated.  2. Right ventricular systolic function is normal. The right ventricular size is normal.  3. Left atrial size was mildly dilated.  4. The mitral valve is normal in structure. Moderate mitral valve regurgitation. No evidence of mitral stenosis.  5. The aortic valve is normal in structure. Aortic valve regurgitation is not visualized. No aortic stenosis is present.  6. The inferior vena cava is normal in size with greater than 50% respiratory variability, suggesting right atrial pressure of 3 mmHg. Comparison(s): No significant change from prior study. FINDINGS  Left Ventricle: Left ventricular ejection fraction, by estimation, is 65 to 70%. The left ventricle has normal function. The left ventricle has no regional wall motion  abnormalities. Definity contrast agent was given IV to delineate the left ventricular  endocardial borders. The left ventricular internal cavity size was normal in size. There  is moderate concentric left ventricular hypertrophy. Left ventricular diastolic function could not be evaluated due to mitral regurgitation (moderate or greater). Left ventricular diastolic function could not be evaluated. Right Ventricle: The right ventricular size is normal. No increase in right ventricular wall thickness. Right ventricular systolic function is normal. Left [facility]: Left atrial size was mildly dilated. Right [facility]: Right atrial size was normal in size. Pericardium: There is no evidence of pericardial effusion. Mitral Valve: The mitral valve is normal in structure. Moderate mitral valve regurgitation, with posteriorly-directed jet. No evidence of mitral valve stenosis. Tricuspid Valve: The tricuspid valve is normal in structure. Tricuspid valve regurgitation is not demonstrated. No evidence of tricuspid stenosis. Aortic Valve: The aortic valve is normal in structure. Aortic valve regurgitation is not visualized. No aortic stenosis is present. Aortic valve mean gradient measures 17.0 mmHg. Aortic valve peak gradient measures 30.7 mmHg. Aortic valve area, by VTI measures 1.85 cm. Pulmonic Valve: The pulmonic valve was normal in structure. Pulmonic valve regurgitation is not visualized. No evidence of pulmonic stenosis. Aorta: The aortic root is normal in size and structure. Venous: The inferior vena cava is normal in size with greater than 50% respiratory variability, suggesting right atrial pressure of 3 mmHg. IAS/Shunts: No atrial level shunt detected by color flow Doppler.  LEFT VENTRICLE PLAX 2D LVIDd:         4.30 cm     Diastology LVIDs:         2.90 cm     LV e' medial:    4.24 cm/s LV PW:         1.30 cm     LV E/e' medial:  20.1 LV IVS:        1.30 cm     LV e' lateral:   4.35 cm/s LVOT diam:     1.90 cm     LV E/e'  lateral: 19.6 LV SV:         126 LV SV Index:   69 LVOT Area:     2.84 cm  LV Volumes (MOD) LV vol d, MOD A2C: 41.4 ml LV vol d, MOD A4C: 58.4 ml LV vol s, MOD A2C: 12.0 ml LV vol s, MOD A4C: 19.9 ml LV SV MOD A2C:     29.4 ml LV SV MOD A4C:     58.4 ml LV SV MOD BP:      32.6 ml RIGHT VENTRICLE RV Basal diam:  3.50 cm RV S prime:     9.36 cm/s TAPSE (M-mode): 2.6 cm LEFT ATRIUM             Index        RIGHT ATRIUM           Index LA diam:        4.20 cm 2.31 cm/m   RA Area:     13.70 cm LA Vol (A2C):   71.9 ml 39.52 ml/m  RA Volume:   38.00 ml  20.89 ml/m LA Vol (A4C):   71.4 ml 39.25 ml/m LA Biplane Vol: 72.5 ml 39.85 ml/m  AORTIC VALVE AV Area (Vmax):    2.05 cm AV Area (Vmean):   2.04 cm AV Area (VTI):     1.85 cm AV Vmax:           277.00 cm/s AV Vmean:          197.000 cm/s AV VTI:            0.679 m AV Peak Grad:  30.7 mmHg AV Mean Grad:      17.0 mmHg LVOT Vmax:         200.00 cm/s LVOT Vmean:        142.000 cm/s LVOT VTI:          0.444 m LVOT/AV VTI ratio: 0.65  AORTA Ao Root diam: 2.80 cm Ao Asc diam:  3.00 cm MITRAL VALVE               TRICUSPID VALVE MV Area (PHT): 2.82 cm    TR Peak grad:   30.0 mmHg MV Decel Time: 269 msec    TR Mean grad:   22.0 mmHg MR Peak grad: 146.2 mmHg   TR Vmax:        274.00 cm/s MR Mean grad: 107.0 mmHg   TR Vmean:       226.0 cm/s MR Vmax:      604.50 cm/s MR Vmean:     499.5 cm/s   SHUNTS MV E velocity: 85.30 cm/s  Systemic VTI:  0.44 m MV A velocity: 93.40 cm/s  Systemic Diam: 1.90 cm MV E/A ratio:  0.91 Kardie Tobb DO Electronically signed by Thomasene Ripple DO Signature Date/Time: 10/22/2022/5:13:43 PM    Final    CT Angio Chest Pulmonary Embolism (PE) W or WO Contrast  Result Date: 10/21/2022 CLINICAL DATA:  Nontraumatic chest wall pain. Pleuritic chest pain. History of hepatocellular carcinoma. EXAM: CT ANGIOGRAPHY CHEST WITH CONTRAST TECHNIQUE: Multidetector CT imaging of the chest was performed using the standard protocol during bolus administration  of intravenous contrast. Multiplanar CT image reconstructions and MIPs were obtained to evaluate the vascular anatomy. RADIATION DOSE REDUCTION: This exam was performed according to the departmental dose-optimization program which includes automated exposure control, adjustment of the mA and/or kV according to patient size and/or use of iterative reconstruction technique. CONTRAST:  80mL OMNIPAQUE IOHEXOL 350 MG/ML SOLN COMPARISON:  07/27/2022 FINDINGS: Cardiovascular: Good opacification of the central and segmental pulmonary arteries with mild motion artifact. No focal filling defects are demonstrated. No evidence of significant pulmonary embolus. Cardiac enlargement. No pericardial effusions. Normal caliber thoracic aorta. No aortic dissection. Calcification of the aorta and coronary arteries. Mediastinum/Nodes: Small esophageal hiatal hernia. Esophagus is decompressed. Scattered lymph nodes are in the mediastinum, largest right paratracheal nodes measuring 1.4 cm short axis dimension. No change since prior study, nonspecific but most likely reactive. Prominent diffuse enlargement of the thyroid gland is unchanged since prior study. No focal nodularity is identified consistent with thyroid goiter. Lungs/Pleura: Motion artifact limits examination. There is atelectasis or consolidation in both lung bases. No pleural effusions. No pneumothorax. No focal lung nodules. Upper Abdomen: Hepatic cirrhosis with splenic enlargement. Known neoplasm is not demonstrated on this early arterial phase. Musculoskeletal: No focal bone lesions. Review of the MIP images confirms the above findings. IMPRESSION: 1. No evidence of significant pulmonary embolus. 2. Atelectasis or infiltration in both lung bases. 3. Cardiac enlargement.  Aortic atherosclerosis. 4. Small esophageal hiatal hernia. 5. Hepatic cirrhosis with enlarged spleen. 6. Diffuse thyroid enlargement consistent with goiter. No change since prior study. This has been  evaluated on previous imaging. (ref: J Am Coll Radiol. 2015 Feb;12(2): 143-50). Electronically Signed   By: Burman Nieves M.D.   On: 10/21/2022 23:00   DG Chest 2 View  Result Date: 10/21/2022 CLINICAL DATA:  Chest pain EXAM: CHEST - 2 VIEW COMPARISON:  07/26/2022 x-ray and CT angiogram FINDINGS: Underinflation. Enlarged cardiopericardial silhouette. Linear opacity lung left lung base likely scar or atelectasis. No  pneumothorax, effusion or edema. Calcified aorta. Overlapping cardiac leads. IMPRESSION: Enlarged cardiopericardial silhouette.  Calcified aorta. Linear opacity left mid to lower lung, likely scar or atelectasis. Recommend follow-up Electronically Signed   By: Karen Kays M.D.   On: 10/21/2022 17:48    ASSESSMENT & PLAN [name] 72 y.o. female with medical history significant for newly diagnosed [facility] who presents for a follow up visit.   # Hepatocellular Carcinoma, Stage 1b -- Diagnosis confirmed based on the MRI imaging. --AFP, CEA, and CA 19-9 were WNL prior to intervention. No utility in monitoring these.  -- currently under evaluation for liver transplant by [facility]. Last seen on 09/22/2022 with next visit in March 2025.  -- underwent embolization/microwave ablation on 07/21/2022.  -- Labs today show white blood cell count 3.3, Hgb 13.0, MCV 87.1, Plt 86. Creatinine was 0.68 with normal LFTs. -- MRI of the liver on 10/28/2022 showed a 2.8 cm ablation zone in segment 8 without evidence of viable tumor, no other findings concerning for recurrent or metastatic HCC. Plan for repeat scan in 6 months time.   # Leukopenia/Thrombocytopenia -- Secondary to cirrhosis. -- Appears stable compared to prior, will continue to monitor.  Orders Placed This Encounter  Procedures   MR LIVER W WO CONTRAST    Standing Status:   Future    Standing Expiration Date:   11/10/2023    Order Specific Question:   If indicated for the ordered procedure, I authorize the administration of contrast media  per Radiology protocol    Answer:   Yes    Order Specific Question:   What is the patient's sedation requirement?    Answer:   No Sedation    Order Specific Question:   Does the patient have a pacemaker or implanted devices?    Answer:   No    Order Specific Question:   Preferred imaging location?    Answer:   [facility] (table limit - 550 lbs)    All questions were answered. The patient knows to call the clinic with any problems, questions or concerns.  A total of more than 30 minutes were spent on this encounter with face-to-face time and non-face-to-face time, including preparing to see the patient, ordering tests and/or medications, counseling the patient and coordination of care as outlined above.   Ulysees Barns, MD Department of Hematology/Oncology [facility] at [facility] Phone: [phone] Pager: [phone] Email: [email or web address]  11/10/2022 5:00 PM

## 2022-11-26 ENCOUNTER — Other Ambulatory Visit: Payer: Self-pay | Admitting: Interventional Radiology

## 2022-11-26 DIAGNOSIS — C220 Liver cell carcinoma: Secondary | ICD-10-CM

## 2022-12-03 ENCOUNTER — Ambulatory Visit
Admission: RE | Admit: 2022-12-03 | Discharge: 2022-12-03 | Disposition: A | Discharge status: Home / Self Care | Payer: Medicare Other | Source: Ambulatory Visit | Attending: Interventional Radiology | Admitting: Interventional Radiology

## 2022-12-03 DIAGNOSIS — C220 Liver cell carcinoma: Secondary | ICD-10-CM

## 2022-12-07 ENCOUNTER — Telehealth: Payer: Medicare Other

## 2022-12-08 ENCOUNTER — Ambulatory Visit
Admission: RE | Admit: 2022-12-08 | Discharge: 2022-12-08 | Disposition: A | Discharge status: Home / Self Care | Payer: Medicare Other | Source: Ambulatory Visit | Attending: Interventional Radiology | Admitting: Interventional Radiology

## 2022-12-08 NOTE — Progress Notes (Signed)
Patient ID: [name], female   DOB: 18-May-1950, 72 y.o.   MRN: [id number]        Chief Complaint: [facility]   Referring Physician(s): Valerie Salts, Vilma Prader   History of Present Illness: [name] is a 72 y.o. female with past medical history significant for pulmonary sarcoidosis, mild aortic stenosis, hypertension, hypothyroidism, COPD, asthma and CAD with known history of NASH cirrhosis who was found to have a worrisome enhancing liver lesion on chest CT performed 04/13/2022, performed for the evaluation of hemoptysis.  Subsequent abdominal MRI performed 04/22/2022 confirms the presence of an approximately 3.1 x 3.1 cm enhancing lesion within segment 8 of the right lobe of the liver, characterized as LI-RADS 5, definitive HCC.   The patient was then been evaluated by Dr. Freida Busman of the hepatobiliary surgical service and deemed a nonoperative candidate given her medical comorbidities and degree of cirrhosis. The patient was then evaluated by Annamarie Major from the hepatobiliary transplant team and the patient was referred for consideration of percutaneous management.   The patient was in interventional radiology clinic on 06/03/2022 and ultimately underwent percutaneous bland embolization and microwave ablation at [facility] on 07/21/2022.    The patient is seen today via tele visit following acquisition of post procedural MRI performed 10/28/2022.  She is presently without procedure related complaint.   Past Medical History:  Diagnosis Date   Anxiety    Arthritis    Asthma    Blood dyscrasia    Thrombocytopenia   CAD (coronary artery disease)    CHF (congestive heart failure) (HCC)    Complication of anesthesia    trouble waking up once with Nissen Fundoplication   COPD (chronic obstructive pulmonary disease) (HCC)    Depression    Diabetes mellitus without complication (HCC)    Dysrhythmia    Heart murmur    hepatocellular ca    Hypertension    Hypothyroidism     Liver disease    NAFLD cirrhosis   Mild aortic stenosis    Pneumonia    Sarcoidosis    Sarcoidosis of lung (HCC)    Sarcoidosis of lymph nodes     Past Surgical History:  Procedure Laterality Date   ABDOMINAL HYSTERECTOMY     ABDOMINAL SURGERY     CHOLECYSTECTOMY     ESOPHAGOGASTRODUODENOSCOPY (EGD) WITH PROPOFOL N/A 04/17/2021   Procedure: ESOPHAGOGASTRODUODENOSCOPY (EGD) WITH PROPOFOL;  Surgeon: Jeani Hawking, MD;  Location: [facility] ENDOSCOPY;  Service: Gastroenterology;  Laterality: N/A;   HERNIA REPAIR     IR 3D INDEPENDENT WKST  07/21/2022   IR ANGIOGRAM SELECTIVE EACH ADDITIONAL VESSEL  07/21/2022   IR ANGIOGRAM VISCERAL SELECTIVE  07/21/2022   IR EMBO TUMOR ORGAN ISCHEMIA INFARCT INC GUIDE ROADMAPPING  07/21/2022   IR GUIDED LIVER TUMOR ABLATION RFA PERC  07/21/2022   IR US GUIDE VASC ACCESS RIGHT  07/21/2022   LASIK  2001   LEFT HEART CATH AND CORONARY ANGIOGRAPHY N/A 09/23/2020   Procedure: LEFT HEART CATH AND CORONARY ANGIOGRAPHY;  Surgeon: Lennette Bihari, MD;  Location: [facility] INVASIVE CV LAB;  Service: Cardiovascular;  Laterality: N/A;   nissenfundiplication     RADIOLOGY WITH ANESTHESIA N/A 07/21/2022   Procedure: IR WITH ANESTHESIA MICROWAVE ABLATION;  Surgeon: Simonne Come, MD;  Location: [facility] ORS;  Service: Anesthesiology;  Laterality: N/A;   TOTAL KNEE ARTHROPLASTY Right 05/19/2021   Procedure: TOTAL KNEE ARTHROPLASTY;  Surgeon: Durene Romans, MD;  Location: [facility] ORS;  Service: Orthopedics;  Laterality: Right;  Allergies: Sulfa antibiotics, Tape, Atorvastatin, Astelin [azelastine], and Amoxicillin  Medications: Prior to Admission medications   Medication Sig Start Date End Date Taking? Authorizing Provider  acetaminophen (TYLENOL) 325 MG tablet Take 325-650 mg by mouth every 6 (six) hours as needed for mild pain, moderate pain or headache.    [provider]  acyclovir (ZOVIRAX) 400 MG tablet Take 400 mg by mouth every 8 (eight) hours as needed (fever blisters). Fever  blisters 05/19/20   [provider]  albuterol (VENTOLIN HFA) 108 (90 Base) MCG/ACT inhaler Inhale 2 puffs into the lungs every 6 (six) hours as needed. Patient taking differently: Inhale 2 puffs into the lungs every 6 (six) hours as needed for shortness of breath or wheezing. 04/08/22   Charlott Holler, MD  ALPRAZolam Prudy Feeler) 0.25 MG tablet Take 0.25 mg by mouth 3 (three) times daily as needed for anxiety. 04/27/22   [provider]  amLODipine (NORVASC) 10 MG tablet Take 1 tablet (10 mg total) by mouth daily. Patient taking differently: Take 10 mg by mouth daily as needed (if the Systolic number is 150 or greater). 11/04/21 10/30/22  Jodelle Red, MD  aspirin EC 81 MG tablet Take 81 mg by mouth daily. Swallow whole.    [provider]  Biotin 10 MG CAPS Take 10 mg by mouth daily.    [provider]  carvedilol (COREG) 25 MG tablet TAKE 1 TABLET BY MOUTH TWICE DAILY WITH A MEAL 10/29/22   Jodelle Red, MD  cetirizine (ZYRTEC) 10 MG tablet Take 10 mg by mouth daily.    [provider]  docusate sodium (COLACE) 100 MG capsule Take 1 capsule (100 mg total) by mouth 2 (two) times daily. Patient taking differently: Take 200 mg by mouth daily. 05/20/21   Cassandria Anger, PA-C  fluticasone (FLONASE) 50 MCG/ACT nasal spray Place 1 spray into both nostrils daily. Patient taking differently: Place 1 spray into both nostrils at bedtime. 06/15/22   Charlott Holler, MD  furosemide (LASIX) 20 MG tablet Take 20-40 mg by mouth See admin instructions. Take 20 mg by mouth in the morning and increase dose to 40 mg for swelling or edema    [provider]  gabapentin (NEURONTIN) 300 MG capsule Take 300 mg by mouth 2 (two) times daily. 09/02/20   [provider]  irbesartan (AVAPRO) 150 MG tablet Take 1 tablet by mouth once daily 09/16/22   Jodelle Red, MD  levothyroxine (SYNTHROID, LEVOTHROID) 150 MCG tablet Take 75-150 mcg by mouth  See admin instructions. Take 150 mcg by mouth in the morning 30 minutes before breakfast and ALTERNATE with 150 mcg EVERY OTHER DAY    [provider]  metFORMIN (GLUCOPHAGE-XR) 500 MG 24 hr tablet Take 2 tablets (1,000 mg total) by mouth 2 (two) times daily. Patient taking differently: Take 500 mg by mouth daily with breakfast. 09/25/20   Marinda Elk, MD  rosuvastatin (CRESTOR) 10 MG tablet Take 10 mg by mouth daily.    [provider]  Semaglutide,0.25 or 0.5MG /DOS, (OZEMPIC, 0.25 OR 0.5 MG/DOSE,) 2 MG/3ML SOPN Inject 0.5 mg into the skin See admin instructions. Inject 0.5 mg into the skin every 7-10 days    [provider]  spironolactone (ALDACTONE) 25 MG tablet Take 25 mg by mouth daily. 11/22/21   [provider]  tiZANidine (ZANAFLEX) 4 MG tablet Take 2-4 mg by mouth every 8 (eight) hours as needed for muscle spasms. 02/10/22   [provider]  traMADol (  ULTRAM) 50 MG tablet Take 50 mg by mouth daily as needed for moderate pain.    [provider]  zolpidem (AMBIEN) 10 MG tablet Take 10 mg by mouth at bedtime.    [provider]     Family History  Problem Relation Age of Onset   Diabetes Mother    Heart attack Mother    Heart failure Father    Hypertension Father    Heart attack Father    Heart Problems Sister    Blindness Sister    Diabetes Sister    Hypertension Sister    Heart attack Brother    Heart attack Brother    Throat cancer Brother    Healthy Son    Rheum arthritis Daughter     Social History   Socioeconomic History   Marital status: Married    Spouse name: Not on file   Number of children: Not on file   Years of education: Not on file   Highest education level: Not on file  Occupational History   Not on file  Tobacco Use   Smoking status: Never    Passive exposure: Past (minimal)   Smokeless tobacco: Never  Vaping Use   Vaping status: Never Used  Substance and Sexual Activity    Alcohol use: No   Drug use: No   Sexual activity: Not Currently  Other Topics Concern   Not on file  Social History Narrative   Not on file   Social Determinants of Health   Financial Resource Strain: Not on file  Food Insecurity: No Food Insecurity (07/24/2022)   Hunger Vital Sign    Worried About Running Out of Food in the Last Year: Never true    Ran Out of Food in the Last Year: Never true  Transportation Needs: No Transportation Needs (07/24/2022)   PRAPARE - Administrator, Civil Service (Medical): No    Lack of Transportation (Non-Medical): No  Physical Activity: Not on file  Stress: Not on file  Social Connections: Not on file    ECOG Status: 1 - Symptomatic but completely ambulatory  Review of Systems: A 12 point ROS discussed and pertinent positives are indicated in the HPI above.  All other systems are negative.  Review of Systems  Vital Signs: There were no vitals taken for this visit.  Physical Exam  Mallampati Score:     Imaging:  Abdominal MRI-10/28/2022; 04/22/2022  CT abdomen and pelvis-09/15/2022; 07/22/2022; 06/16/2022  Intra procedural images during bland hepatic embolization and image guided microwave ablation performed 07/21/2022   Personal review abdominal MRI performed 10/28/2022 demonstrates a technically excellent result without evidence of residual or locally recurrent disease or tissue.   Importantly, there are no new discrete areas abnormal enhancement to suggest multifocal hepatocellular carcinoma.  Labs:  CBC: Recent Labs    07/26/22 1956 08/06/22 1433 10/21/22 1655 11/10/22 1041  WBC 14.3* 4.1 8.0 3.3*  HGB 11.1* 11.2* 13.2 13.0  HCT 33.8* 34.3* 40.9 39.7  PLT 147* 106* 112* 86*    COAGS: Recent Labs    04/13/22 0948 06/30/22 0925  INR 1.1 1.0    BMP: Recent Labs    07/26/22 1956 08/06/22 1433 10/21/22 1655 11/10/22 1041  NA 136 139 134* 140  K 4.4 4.3 4.3 4.9  CL 109 109 101 109  CO2 20* 26 22  27   GLUCOSE 134* 84 128* 123*  BUN 19 13 32* 26*  CALCIUM 8.9 9.5 9.7 10.0  CREATININE 0.89 0.55  0.73 0.68  GFRNONAA >60 >60 >60 >60    LIVER FUNCTION TESTS: Recent Labs    07/26/22 1956 08/06/22 1433 10/21/22 1655 11/10/22 1041  BILITOT 1.9* 1.0 0.6 0.5  AST 46* 39 52* 59*  ALT 63* 32 55* 58*  ALKPHOS 97 98 118 132*  PROT 7.3 6.7 7.7 7.5  ALBUMIN 3.5 3.4* 3.7 3.8    TUMOR MARKERS:   Assessment and Plan:  TILLA BRIDSON is a 72 y.o. female with past medical history significant for pulmonary sarcoidosis, mild aortic stenosis, hypertension, hypothyroidism, COPD, asthma and CAD with known history of NASH cirrhosis post percutaneous bland embolization and microwave ablation at [facility] on 07/21/2022.    The patient is seen today via tele visit following acquisition of post procedural MRI performed 10/28/2022.  The patient is without procedure related complaint and there is normalization of her LFTs.  The following exams were reviewed in detail: Abdominal MRI-10/28/2022; 04/22/2022  CT abdomen and pelvis-09/15/2022; 07/22/2022; 06/16/2022  Intra procedural images during bland hepatic embolization and image guided microwave ablation performed 07/21/2022   Personal review abdominal MRI performed 10/28/2022 demonstrates a technically excellent result without evidence of residual or locally recurrent disease or tissue. Importantly, there are no new discrete areas abnormal enhancement to suggest multifocal hepatocellular carcinoma.  As such, we will plan on performing surveillance of not only the treated lesion but also to evaluate for new areas of HCC with q 3 months MRIs, the next to be obtained in January/February of next year.  The patient's AFP was NOT elevated prior to the procedure (4.1 on 4/13) however I will obtain another at the time of the next  Given my new role as practice president, I'm transitioning this patient's subsequent follow up to Dr. Archer Asa, who  assisted me with this procedure.   The pt demonstrated excellent understanding of the above discussion and is in agreement with the POC.    PLAN: - Follow-up tele consult following acquisition of post procedural abdominal MRI 3 months (January/February) with acquisition of AFP level at that time. - Subsequent follow-up with be with my IR partner, Dr. Archer Asa, who assisted me with this procedure.   Thank you for this interesting consult.  I greatly enjoyed meeting KHAMARI MCCLENTON and look forward to participating in their care.  A copy of this report was sent to the requesting provider on this date.  Electronically Signed: Simonne Come 12/08/2022, 9:49 AM   I spent a total of 10 Minutes in face to face in clinical consultation, greater than 50% of which was counseling/coordinating care for post hepatic bland embolization and MWA ablation.

## 2022-12-12 ENCOUNTER — Other Ambulatory Visit (HOSPITAL_BASED_OUTPATIENT_CLINIC_OR_DEPARTMENT_OTHER): Payer: Self-pay | Admitting: Cardiology

## 2022-12-12 DIAGNOSIS — I10 Essential (primary) hypertension: Secondary | ICD-10-CM

## 2023-01-10 NOTE — Addendum Note (Signed)
Encounter addended by: Arby Barrette on: 01/10/2023 10:38 AM  Actions taken: Imaging Exam ended

## 2023-01-31 IMAGING — MR MR ABDOMEN WO/W CM
19 series · 48 of 48 positions shown · IV contrast (7ml GADAVIST)
Comparison: Abdominal ultrasound 04/15/2021

CLINICAL DATA: Hepatic cirrhosis, abnormal ultrasound

EXAM:
MRI ABDOMEN WITHOUT AND WITH CONTRAST
TECHNIQUE: Multiplanar multisequence MR imaging of the abdomen was performed
both before and after the administration of intravenous contrast.
CONTRAST:  7mL GADAVIST GADOBUTROL 1 MMOL/ML IV SOLN

[Series 2: DWI · axial · 6.0mm · 1.42mm/px · z∈[-125,+127]mm · 3 of 72 slices shown (1 of 2)]
[im 1/72]
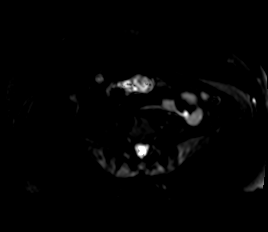
[im 36/72]
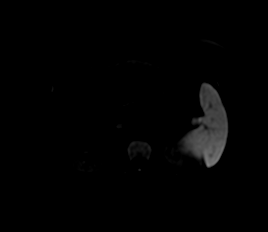
[im 72/72]
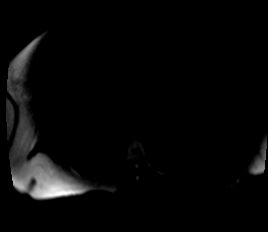

[Series 3: DWI · axial · 6.0mm · 1.42mm/px · z∈[-125,+127]mm · 2 of 36 slices shown (2 of 2)]
[im 1/36]
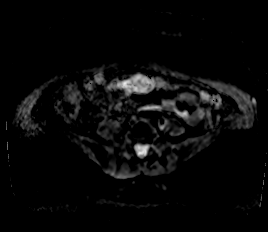
[im 36/36]
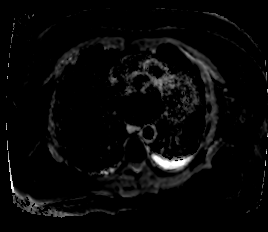

[Series 4: T2 fat-sat · axial · 6.0mm · 1.25mm/px · 1 of 40 slices shown]
[im 1/40]
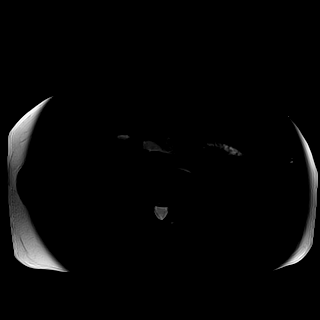

[Series 7: cor haste · coronal · 6.0mm · 1.56mm/px · 1 of 36 slices shown]
[im 1/36]
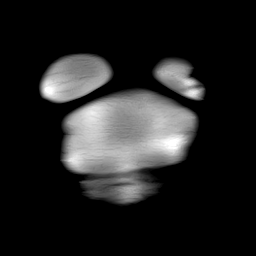

[Series 8: bSSFP · axial · 6.0mm · 2.08mm/px · z∈[-150,+126]mm · 2 of 47 slices shown]
[im 1/47]
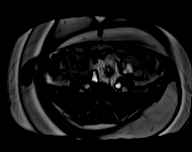
[im 47/47]
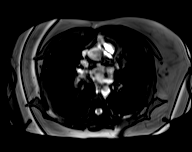

[Series 9: T1 dynamic · axial · 3.0mm · 1.56mm/px · z∈[-155,+130]mm · 3 of 96 slices shown (1 of 10)]
[im 1/96]
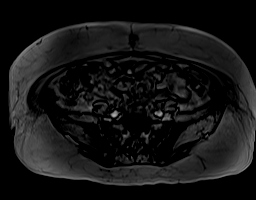
[im 48/96]
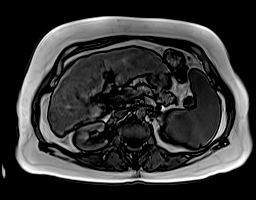
[im 96/96]
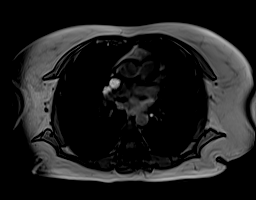

[Series 10: T1 dynamic · axial · 3.0mm · 1.56mm/px · z∈[-155,+130]mm · 3 of 96 slices shown (2 of 10)]
[im 1/96]
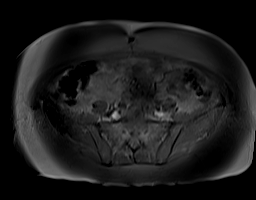
[im 48/96]
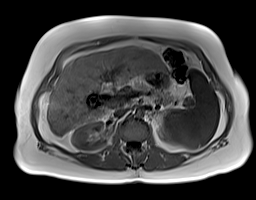
[im 96/96]
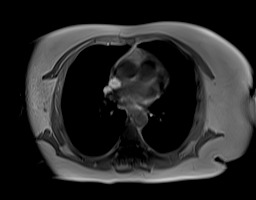

[Series 12: T1 dynamic · axial · 3.0mm · 1.56mm/px · z∈[-155,+130]mm · 3 of 96 slices shown (3 of 10)]
[im 1/96]
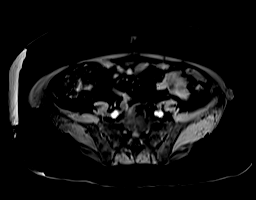
[im 48/96]
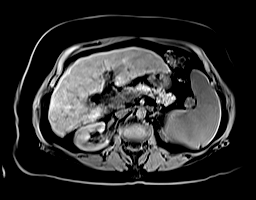
[im 96/96]
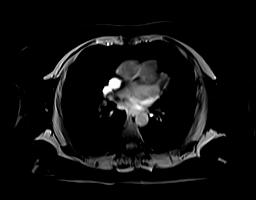

[Series 17: T1 dynamic · axial · 3.0mm · 1.56mm/px · z∈[-155,+130]mm · 3 of 96 slices shown (4 of 10)]
[im 1/96]
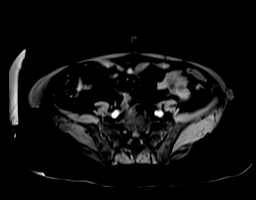
[im 48/96]
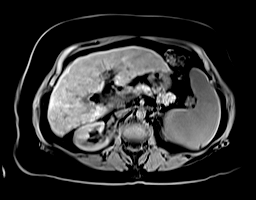
[im 96/96]
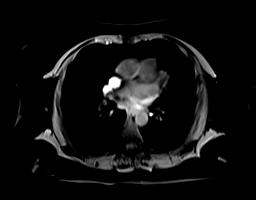

[Series 19: T1 dynamic · axial · 3.0mm · 1.56mm/px · z∈[-155,+130]mm · 3 of 96 slices shown (5 of 10)]
[im 1/96]
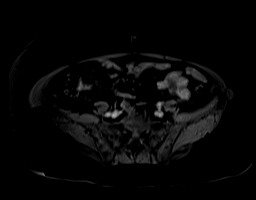
[im 48/96]
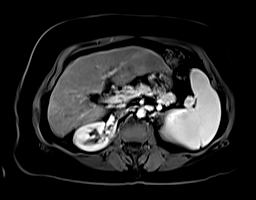
[im 96/96]
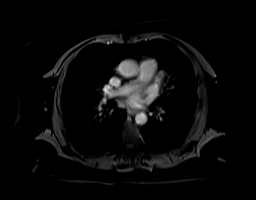

[Series 20: T1 dynamic · axial · 3.0mm · 1.56mm/px · z∈[-155,+130]mm · 3 of 96 slices shown (6 of 10)]
[im 1/96]
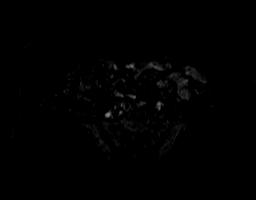
[im 48/96]
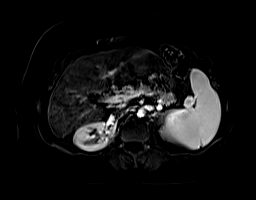
[im 96/96]
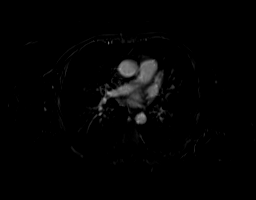

[Series 22: T1 dynamic · axial · 3.0mm · 1.56mm/px · z∈[-155,+130]mm · 3 of 96 slices shown (7 of 10)]
[im 1/96]
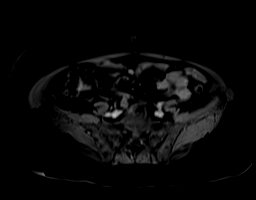
[im 48/96]
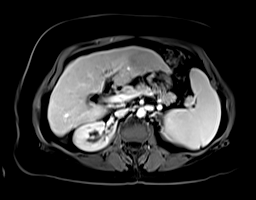
[im 96/96]
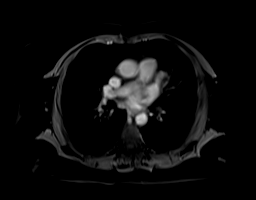

[Series 23: T1 dynamic · axial · 3.0mm · 1.56mm/px · z∈[-155,+130]mm · 3 of 96 slices shown (8 of 10)]
[im 1/96]
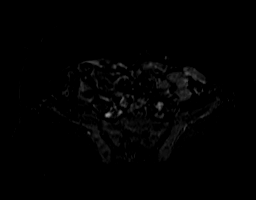
[im 48/96]
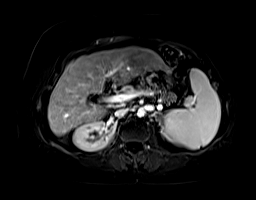
[im 96/96]
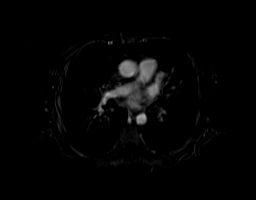

[Series 25: T1 dynamic · axial · 3.0mm · 1.56mm/px · z∈[-155,+130]mm · 3 of 96 slices shown (9 of 10)]
[im 1/96]
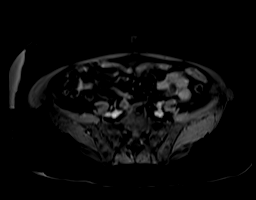
[im 48/96]
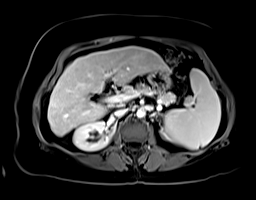
[im 96/96]
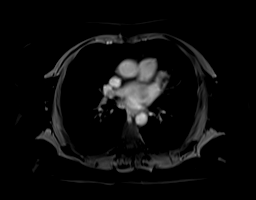

[Series 26: T1 dynamic · axial · 3.0mm · 1.56mm/px · z∈[-155,+130]mm · 3 of 96 slices shown (10 of 10)]
[im 1/96]
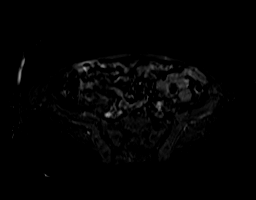
[im 48/96]
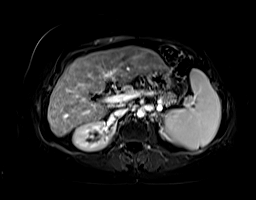
[im 96/96]
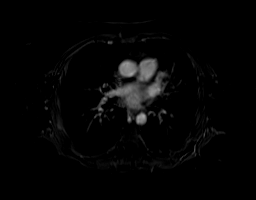

[Series 27: ax_haste_mbh · axial · 6.0mm · 1.25mm/px · 1 of 38 slices shown]
[im 1/38]
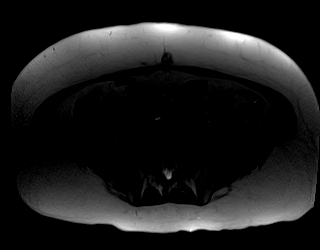

[Series 29: cor_vibe_dixon_delayed_w · coronal · 4.4mm · 2.34mm/px · 2 of 64 slices shown]
[im 1/64]
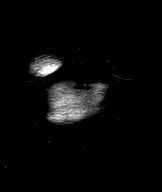
[im 64/64]
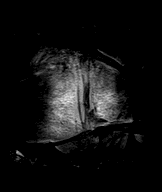

[Series 31: ax_dixon_delayed_w_reg · axial · 3.0mm · 1.56mm/px · z∈[-155,+130]mm · 3 of 96 slices shown]
[im 1/96]
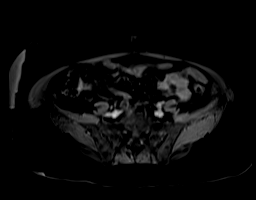
[im 48/96]
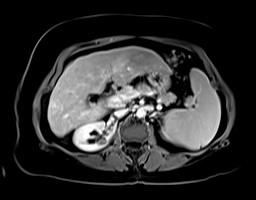
[im 96/96]
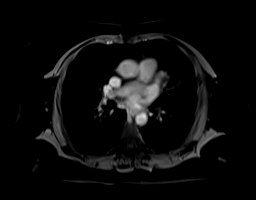

[Series 32: ax_dixon_delayed_w_reg_sub · axial · 3.0mm · 1.56mm/px · z∈[-155,+130]mm · 3 of 96 slices shown]
[im 1/96]
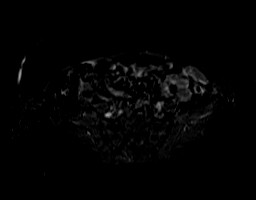
[im 48/96]
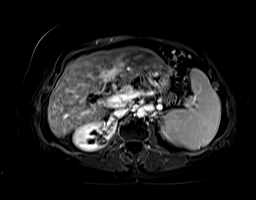
[im 96/96]
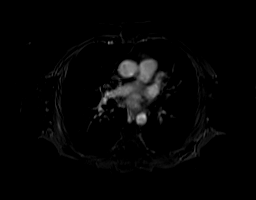

[48 of 48 positions shown; findings below may reference images not displayed]

FINDINGS: Lower chest: Trace bilateral pleural effusions.

Hepatobiliary: Liver is nodular with inhomogeneous parenchyma
consistent with cirrhosis. No suspicious hepatic mass visualized.
Gallbladder is surgically absent. No significant biliary ductal
dilatation appreciated.

Pancreas: No mass, inflammatory changes, or other parenchymal
abnormality identified.

Spleen:  Enlarged measuring 14 cm in length.

Adrenals/Urinary Tract: Adrenal glands appear grossly normal. 8 mm
hemorrhagic/proteinaceous cyst in the anterior right kidney. No
suspicious renal mass identified. No hydronephrosis.

Stomach/Bowel: Small hiatal hernia. No evidence of bowel
obstruction.

Vascular/Lymphatic: No pathologically enlarged lymph nodes
identified. No abdominal aortic aneurysm demonstrated.

Other:  Trace ascites in the upper abdomen.

Musculoskeletal: No suspicious bone lesions identified.
IMPRESSION: 1. Hepatic cirrhosis with no suspicious mass identified.
2. Splenomegaly suggesting portal hypertension.
3. Small hiatal hernia.
4. Small hemorrhagic/proteinaceous cyst in the right kidney.
5. Trace pleural effusions and trace ascites.

## 2023-02-07 NOTE — Progress Notes (Unsigned)
Cardiology Clinic Note   Patient Name: [name] Date of Encounter: 02/08/2023  Primary Care Provider:  Ralene Ok, MD Primary Cardiologist:  Jodelle Red, MD  Patient Profile    [name] 73 year old female presents the clinic today for follow-up evaluation of her chest discomfort.  Past Medical History    Past Medical History:  Diagnosis Date   Anxiety    Arthritis    Asthma    Blood dyscrasia    Thrombocytopenia   CAD (coronary artery disease)    CHF (congestive heart failure) (HCC)    Complication of anesthesia    trouble waking up once with Nissen Fundoplication   COPD (chronic obstructive pulmonary disease) (HCC)    Depression    Diabetes mellitus without complication (HCC)    Dysrhythmia    Heart murmur    hepatocellular ca    Hypertension    Hypothyroidism    Liver disease    NAFLD cirrhosis   Mild aortic stenosis    Pneumonia    Sarcoidosis    Sarcoidosis of lung (HCC)    Sarcoidosis of lymph nodes    Past Surgical History:  Procedure Laterality Date   ABDOMINAL HYSTERECTOMY     ABDOMINAL SURGERY     CHOLECYSTECTOMY     ESOPHAGOGASTRODUODENOSCOPY (EGD) WITH PROPOFOL N/A 04/17/2021   Procedure: ESOPHAGOGASTRODUODENOSCOPY (EGD) WITH PROPOFOL;  Surgeon: Jeani Hawking, MD;  Location: [facility];  Service: Gastroenterology;  Laterality: N/A;   HERNIA REPAIR     IR 3D INDEPENDENT WKST  07/21/2022   IR ANGIOGRAM SELECTIVE EACH ADDITIONAL VESSEL  07/21/2022   IR ANGIOGRAM VISCERAL SELECTIVE  07/21/2022   IR EMBO TUMOR ORGAN ISCHEMIA INFARCT INC GUIDE ROADMAPPING  07/21/2022   IR GUIDED LIVER TUMOR ABLATION RFA PERC  07/21/2022   IR US GUIDE VASC ACCESS RIGHT  07/21/2022   LASIK  2001   LEFT HEART CATH AND CORONARY ANGIOGRAPHY N/A 09/23/2020   Procedure: LEFT HEART CATH AND CORONARY ANGIOGRAPHY;  Surgeon: Lennette Bihari, MD;  Location: [facility];  Service: Cardiovascular;  Laterality: N/A;   nissenfundiplication     RADIOLOGY WITH  ANESTHESIA N/A 07/21/2022   Procedure: IR WITH ANESTHESIA MICROWAVE ABLATION;  Surgeon: Simonne Come, MD;  Location: [facility];  Service: Anesthesiology;  Laterality: N/A;   TOTAL KNEE ARTHROPLASTY Right 05/19/2021   Procedure: TOTAL KNEE ARTHROPLASTY;  Surgeon: Durene Romans, MD;  Location: [facility];  Service: Orthopedics;  Laterality: Right;    Allergies  Allergies  Allergen Reactions   Sulfa Antibiotics Other (See Comments)    High fever and Body aches   Tape Other (See Comments)    TEARS OFF THE SKIN!!!!   Atorvastatin Other (See Comments)    Extreme muscle cramps and muscle aches   Astelin [Azelastine] Other (See Comments)    Nasal spray caused a burning sensation    Amoxicillin Rash and Other (See Comments)    Tolerated Ancef in 2023, received Rocephin inpatient & prescribed Omnicef outpatient prior to Amox allergy being documented.     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, hepatocellular carcinoma, hypothyroidism, and type 2 diabetes.  She underwent cardiac catheterization which showed mild disease with 20% stenosis of her proximal circumflex and otherwise normal coronary arteries.  She underwent cardiac MRI due to her history of pulmonary sarcoidosis, SAM noted on echocardiogram with concern  for LVOT and family history of sudden cardiac death in her brother.  Cardiac MRI showed asymmetric septal hypertrophy and was without evidence of infiltrative disease.  Her mitral valve showed moderate MR.  She was seen in the emergency department on 10/21/2022 and discharged on 10/25/2022.  She reported stabbing type chest discomfort that was radiating to her back and left arm.  She reported several hours of discomfort.  She was unable to lay down due to her pain.  She did note some nauseousness, denied vomiting and abdominal pain.  She denied shortness of breath,  fever and chills.  She denied illicit drug use.  She was felt to have CHF exacerbation.  Her cardiac troponins were negative.  Cardiology was consulted and recommended admission with cardiac PET CT.  She underwent CT angio chest 10/21/2022 which showed no evidence of significant PE or atelectasis.  Small esophageal hiatal hernia, and enlarged spleen were noted.  It was felt that her chest discomfort was atypical in nature.  Echocardiogram 10/22/2022 showed LVEF of 65 to 70%, mildly dilated left atria, moderate mitral valve regurgitation and no other significant valvular abnormalities.  Follow-up with cardiology was planned for January.  She presents to the clinic today for follow-up evaluation and states she continues to have fatigue and notes decreased exercise tolerance.  We reviewed her October emergency department visit, diagnostic test, and echocardiogram.  She expressed understanding.  She notes that she has had increased fatigue over the last 2 months.  She has also had increased rest related to caring for her husband and not being able to be as physically active.  She does light housework and continues to feed her dogs.  She notes that she continues to be on hold with the liver transplant list.  She was scheduled for follow-up in early February/late January which has now been pushed to April.  I will order a CBC, BMP, give mindfulness stress reduction, and encouraged her to follow-up sooner with oncology.  Today's she denies chest pain, shortness of breath, lower extremity edema, palpitations, melena, hematuria, hemoptysis, diaphoresis, weakness, presyncope, syncope, orthopnea, and PND.    Home Medications    Prior to Admission medications   Medication Sig Start Date End Date Taking? Authorizing Provider  acetaminophen (TYLENOL) 325 MG tablet Take 325-650 mg by mouth every 6 (six) hours as needed for mild pain, moderate pain or headache.    [provider]  acyclovir (ZOVIRAX) 400 MG  tablet Take 400 mg by mouth every 8 (eight) hours as needed (fever blisters). Fever blisters 05/19/20   [provider]  albuterol (VENTOLIN HFA) 108 (90 Base) MCG/ACT inhaler Inhale 2 puffs into the lungs every 6 (six) hours as needed. Patient taking differently: Inhale 2 puffs into the lungs every 6 (six) hours as needed for shortness of breath or wheezing. 04/08/22   Charlott Holler, MD  ALPRAZolam Prudy Feeler) 0.25 MG tablet Take 0.25 mg by mouth 3 (three) times daily as needed for anxiety. 04/27/22   [provider]  amLODipine (NORVASC) 10 MG tablet Take 1 tablet by mouth once daily 12/13/22   Jodelle Red, MD  aspirin EC 81 MG tablet Take 81 mg by mouth daily. Swallow whole.    [provider]  Biotin 10 MG CAPS Take 10 mg by mouth daily.    [provider]  carvedilol (COREG) 25 MG tablet TAKE 1 TABLET BY MOUTH TWICE DAILY WITH A MEAL 10/29/22   Jodelle Red, MD  cetirizine (ZYRTEC) 10 MG tablet  Take 10 mg by mouth daily.    [provider]  docusate sodium (COLACE) 100 MG capsule Take 1 capsule (100 mg total) by mouth 2 (two) times daily. Patient taking differently: Take 200 mg by mouth daily. 05/20/21   Cassandria Anger, PA-C  fluticasone (FLONASE) 50 MCG/ACT nasal spray Place 1 spray into both nostrils daily. Patient taking differently: Place 1 spray into both nostrils at bedtime. 06/15/22   Charlott Holler, MD  furosemide (LASIX) 20 MG tablet Take 20-40 mg by mouth See admin instructions. Take 20 mg by mouth in the morning and increase dose to 40 mg for swelling or edema    [provider]  gabapentin (NEURONTIN) 300 MG capsule Take 300 mg by mouth 2 (two) times daily. 09/02/20   [provider]  irbesartan (AVAPRO) 150 MG tablet Take 1 tablet by mouth once daily 09/16/22   Jodelle Red, MD  levothyroxine (SYNTHROID, LEVOTHROID) 150 MCG tablet Take 75-150 mcg by mouth See admin instructions. Take 150 mcg by  mouth in the morning 30 minutes before breakfast and ALTERNATE with 150 mcg EVERY OTHER DAY    [provider]  metFORMIN (GLUCOPHAGE-XR) 500 MG 24 hr tablet Take 2 tablets (1,000 mg total) by mouth 2 (two) times daily. Patient taking differently: Take 500 mg by mouth daily with breakfast. 09/25/20   Marinda Elk, MD  rosuvastatin (CRESTOR) 10 MG tablet Take 10 mg by mouth daily.    [provider]  Semaglutide,0.25 or 0.5MG /DOS, (OZEMPIC, 0.25 OR 0.5 MG/DOSE,) 2 MG/3ML SOPN Inject 0.5 mg into the skin See admin instructions. Inject 0.5 mg into the skin every 7-10 days    [provider]  spironolactone (ALDACTONE) 25 MG tablet Take 25 mg by mouth daily. 11/22/21   [provider]  tiZANidine (ZANAFLEX) 4 MG tablet Take 2-4 mg by mouth every 8 (eight) hours as needed for muscle spasms. 02/10/22   [provider]  traMADol (ULTRAM) 50 MG tablet Take 50 mg by mouth daily as needed for moderate pain.    [provider]  zolpidem (AMBIEN) 10 MG tablet Take 10 mg by mouth at bedtime.    [provider]    Family History    Family History  Problem Relation Age of Onset   Diabetes Mother    Heart attack Mother    Heart failure Father    Hypertension Father    Heart attack Father    Heart Problems Sister    Blindness Sister    Diabetes Sister    Hypertension Sister    Heart attack Brother    Heart attack Brother    Throat cancer Brother    Healthy Son    Rheum arthritis Daughter    She indicated that her mother is deceased. She indicated that her father is deceased. She indicated that all of her three sisters are alive. She indicated that three of her five brothers are alive. She indicated that her daughter is alive. She indicated that her son is alive.  Social History    Social History   Socioeconomic History   Marital status: Married    Spouse name: Not on file   Number of children: Not on file   Years of education:  Not on file   Highest education level: Not on file  Occupational History   Not on file  Tobacco Use   Smoking status: Never    Passive exposure: Past (minimal)   Smokeless tobacco: Never  Vaping Use   Vaping status: Never Used  Substance and Sexual Activity   Alcohol use: No   Drug use: No   Sexual activity: Not Currently  Other Topics Concern   Not on file  Social History Narrative   Not on file   Social Drivers of Health   Financial Resource Strain: Not on file  Food Insecurity: No Food Insecurity (07/24/2022)   Hunger Vital Sign    Worried About Running Out of Food in the Last Year: Never true    Ran Out of Food in the Last Year: Never true  Transportation Needs: No Transportation Needs (07/24/2022)   PRAPARE - Administrator, Civil Service (Medical): No    Lack of Transportation (Non-Medical): No  Physical Activity: Not on file  Stress: Not on file  Social Connections: Not on file  Intimate Partner Violence: Not At Risk (07/24/2022)   Humiliation, Afraid, Rape, and Kick questionnaire    Fear of Current or Ex-Partner: No    Emotionally Abused: No    Physically Abused: No    Sexually Abused: No     Review of Systems    General:  No chills, fever, night sweats or weight changes.  Cardiovascular:  No chest pain, dyspnea on exertion, edema, orthopnea, palpitations, paroxysmal nocturnal dyspnea. Dermatological: No rash, lesions/masses Respiratory: No cough, dyspnea Urologic: No hematuria, dysuria Abdominal:   No nausea, vomiting, diarrhea, bright red blood per rectum, melena, or hematemesis Neurologic:  No visual changes, wkns, changes in mental status. All other systems reviewed and are otherwise negative except as noted above.  Physical Exam    VS:  BP 132/62   Pulse 65   Ht 5\' 2"  (1.575 m)   Wt 183 lb (83 kg)   SpO2 98%   BMI 33.47 kg/m  , BMI Body mass index is 33.47 kg/m. GEN: Well nourished, well developed, in no acute distress. HEENT:  normal. Neck: Supple, no JVD, carotid bruits, or masses. Cardiac: RRR, no murmurs, rubs, or gallops. No clubbing, cyanosis, edema.  Radials/DP/PT 2+ and equal bilaterally.  Respiratory:  Respirations regular and unlabored, clear to auscultation bilaterally. GI: Soft, nontender, nondistended, BS + x 4. MS: no deformity or atrophy. Skin: warm and dry, no rash. Neuro:  Strength and sensation are intact. Psych: Normal affect.  Accessory Clinical Findings    Recent Labs: 10/21/2022: B Natriuretic Peptide 77.3; Magnesium 2.0 11/10/2022: ALT 58; BUN 26; Creatinine 0.68; Hemoglobin 13.0; Platelet Count 86; Potassium 4.9; Sodium 140   Recent Lipid Panel    Component Value Date/Time   CHOL 116 09/23/2020 0210   TRIG 24 09/23/2020 0210   HDL 48 09/23/2020 0210   CHOLHDL 2.4 09/23/2020 0210   VLDL 5 09/23/2020 0210   LDLCALC 63 09/23/2020 0210         ECG personally reviewed by me today-none today.    Echocardiogram 10/22/2022 IMPRESSIONS     1. Left ventricular ejection fraction, by estimation, is 65 to 70%. The  left ventricle has normal function. The left ventricle has no regional  wall motion abnormalities. There is moderate concentric left ventricular  hypertrophy. Left ventricular  diastolic function could not be evaluated.   2. Right ventricular systolic function is normal. The right ventricular  size is normal.   3. Left atrial size was mildly dilated.   4. The mitral valve is normal in structure. Moderate mitral valve  regurgitation. No evidence of mitral stenosis.   5. The aortic valve is normal in structure.  Aortic valve regurgitation is  not visualized. No aortic stenosis is present.   6. The inferior vena cava is normal in size with greater than 50%  respiratory variability, suggesting right atrial pressure of 3 mmHg.   Comparison(s): No significant change from prior study.   FINDINGS   Left Ventricle: Left ventricular ejection fraction, by estimation, is 65  to  70%. The left ventricle has normal function. The left ventricle has no  regional wall motion abnormalities. Definity contrast agent was given IV  to delineate the left ventricular   endocardial borders. The left ventricular internal cavity size was normal  in size. There is moderate concentric left ventricular hypertrophy. Left  ventricular diastolic function could not be evaluated due to mitral  regurgitation (moderate or greater).  Left ventricular diastolic function could not be evaluated.   Right Ventricle: The right ventricular size is normal. No increase in  right ventricular wall thickness. Right ventricular systolic function is  normal.   Left [facility]: Left atrial size was mildly dilated.   Right [facility]: Right atrial size was normal in size.   Pericardium: There is no evidence of pericardial effusion.   Mitral Valve: The mitral valve is normal in structure. Moderate mitral  valve regurgitation, with posteriorly-directed jet. No evidence of mitral  valve stenosis.   Tricuspid Valve: The tricuspid valve is normal in structure. Tricuspid  valve regurgitation is not demonstrated. No evidence of tricuspid  stenosis.   Aortic Valve: The aortic valve is normal in structure. Aortic valve  regurgitation is not visualized. No aortic stenosis is present. Aortic  valve mean gradient measures 17.0 mmHg. Aortic valve peak gradient  measures 30.7 mmHg. Aortic valve area, by VTI  measures 1.85 cm.   Pulmonic Valve: The pulmonic valve was normal in structure. Pulmonic valve  regurgitation is not visualized. No evidence of pulmonic stenosis.   Aorta: The aortic root is normal in size and structure.   Venous: The inferior vena cava is normal in size with greater than 50%  respiratory variability, suggesting right atrial pressure of 3 mmHg.   IAS/Shunts: No atrial level shunt detected by color flow Doppler.    Cardiac catheterization 09/23/2020    Prox Cx lesion is 20% stenosed.    Mild nonobstructive CAD with tortuous vessels and smooth 20% narrowing in the proximal circumflex far artery.  The LAD appears angiographically normal.  The right coronary artery is a dominant vessel with superior takeoff.   Mild LV to aorta gradient on pullback with a peak to peak gradient at approximately 16 mmHg.   Recommendation: Medical therapy.   Diagnostic Dominance: Right   _______________   Cardiac MRI 09/25/2020: Impressions: Asymmetric septal hypertrophy without other evidence of infiltrative disease and systolic anterior motion of the mitral valve: This can be seen in hypertropic obstructive cardiomyopathy.   Moderate mitral regurgitation.        Assessment & Plan   1.  Chest discomfort-denies further episodes of chest discomfort.  Denies exertional chest pain.  Echocardiogram reassuring.  Chest CT showed no signs of PE.  Chest pain atypical in nature. No plans for ischemic evaluation  Fatigue, stress-reports increased fatigue over the past 2 months.  Notes that she followed up with oncology at the end of October.  Is scheduled for repeat evaluation in April.  Reports intermittent issues with bleeding and ED visits with coughing up blood.  No active bleeding today.  Also notes increased stress related to not being able to be as physically active and  caring for her husband. Will repeat CBC, BMP Mindfulness stress reduction Recommend sooner follow-up with oncology  Mitral valve regurgitation-no increased DOE or activity intolerance.  Echocardiogram 10/22/2022 showed normal LVEF, mild left atrial dilation and moderate mitral valve regurgitation. Repeat echocardiogram 10/25  Chronic HFpEF-echocardiogram 9/22 showed an LVEF of 65-70%, G2 DD, moderate MR and SAM.  Follow-up echocardiogram 10/22/2022 showed normal LVEF, mild left atrial dilation and moderate mitral valve regurgitation. Continue current medical therapy Heart healthy low-sodium diet  Essential  hypertension-BP today 132/62. Maintain blood pressure log Heart healthy low-sodium diet Increase physical activity as tolerated Continue spironolactone, irbesartan, carvedilol  Hyperlipidemia-LDL 63 on 09/23/20. High-fiber diet Continue rosuvastatin  Dyspnea-chronic, stable.  Echocardiogram reassuring. 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  [facility]. Follows with PCP  Disposition: Follow-up with Dr. Cristal Deer or me in 3-4 months.   Thomasene Ripple. Ambera Fedele NP-C     02/08/2023, 10:56 AM [facility] [address] Office [phone] Fax [phone]    I spent 14 minutes examining this patient, reviewing medications, and using patient centered shared decision making involving their cardiac care.   I spent greater than 20 minutes reviewing their past medical history,  medications, and prior cardiac tests.

## 2023-02-08 ENCOUNTER — Ambulatory Visit: Payer: Medicare Other | Attending: General Practice | Admitting: General Practice

## 2023-02-08 ENCOUNTER — Encounter: Payer: Self-pay | Admitting: General Practice

## 2023-02-08 ENCOUNTER — Ambulatory Visit (HOSPITAL_BASED_OUTPATIENT_CLINIC_OR_DEPARTMENT_OTHER): Payer: Medicare Other | Admitting: Cardiology

## 2023-02-08 VITALS — BP 132/62 | HR 65 | Ht 62.0 in | Wt 183.0 lb

## 2023-02-08 DIAGNOSIS — I10 Essential (primary) hypertension: Secondary | ICD-10-CM | POA: Diagnosis not present

## 2023-02-08 DIAGNOSIS — R5383 Other fatigue: Secondary | ICD-10-CM

## 2023-02-08 DIAGNOSIS — R0600 Dyspnea, unspecified: Secondary | ICD-10-CM | POA: Diagnosis not present

## 2023-02-08 DIAGNOSIS — E782 Mixed hyperlipidemia: Secondary | ICD-10-CM

## 2023-02-08 DIAGNOSIS — R0789 Other chest pain: Secondary | ICD-10-CM | POA: Diagnosis not present

## 2023-02-08 NOTE — Patient Instructions (Signed)
Medication Instructions:  The current medical regimen is effective;  continue present plan and medications as directed. Please refer to the Current Medication list given to you today.  *If you need a refill on your cardiac medications before your next appointment, please call your pharmacy*  Lab Work: CBC AND BMET TODAY If you have labs (blood work) drawn today and your tests are completely normal, you will receive your results only by:  MyChart Message (if you have MyChart) OR  A paper copy in the mail If you have any lab test that is abnormal or we need to change your treatment, we will call you to review the results.  Testing/Procedures: NONE  Other Instructions PLEASE READ AND FOLLOW ATTACHED CHAIR EXERCISES AND STRESS REDUCTION TIPS MAKE SURE TO FOLLOW UP WITH YOUR ONCOLOGIST   Follow-Up: At [facility], you and your health needs are our priority.  As part of our continuing mission to provide you with exceptional heart care, we have created designated Provider Care Teams.  These Care Teams include your primary Cardiologist (physician) and Advanced Practice Providers (APPs -  Physician Assistants and Nurse Practitioners) who all work together to provide you with the care you need, when you need it.  Your next appointment:   3-4 month(s)  Provider:   Jodelle Red, MD OR Edd Fabian, FNP-C     Mindfulness-Based Stress Reduction Mindfulness-based stress reduction (MBSR) is a program that helps people learn to practice mindfulness. Mindfulness is the practice of consciously paying attention to the present moment. MBSR focuses on developing self-awareness, which lets you respond to life stress without judgment or negative feelings. It can be learned and practiced through techniques such as education, breathing exercises, meditation, and yoga. MBSR includes several mindfulness techniques in one program. MBSR works best when you understand the treatment, are willing to  try new things, and can commit to spending time practicing what you learn. MBSR training may include learning about: How your feelings, thoughts, and reactions affect your body. New ways to respond to things that cause negative thoughts to start (triggers). How to notice your thoughts and let go of them. Practicing awareness of everyday things that you normally do without thinking. The techniques and goals of different types of meditation. What are the benefits of MBSR? MBSR can have many benefits, which include helping you to: Develop self-awareness. This means knowing and understanding yourself. Learn skills and attitudes that help you to take part in your own health care. Learn new ways to care for yourself. Be more accepting about how things are, and let things go. Be less judgmental and approach things with an open mind. Be patient with yourself and trust yourself more. MBSR has also been shown to: Reduce negative emotions, such as sadness, overwhelm, and worry. Improve memory and focus. Change how you sense and react to pain. Boost your body's ability to fight infections. Help you connect better with other people. Improve your sense of well-being. How to practice mindfulness To do a basic awareness exercise: Find a comfortable place to sit. Pay attention to the present moment. Notice your thoughts, feelings, and surroundings just as they are. Avoid judging yourself, your feelings, or your surroundings. Make note of any judgment that comes up and let it go. Your mind may wander, and that is okay. Make note of when your thoughts drift, and return your attention to the present moment. To do basic mindfulness meditation: Find a comfortable place to sit. This may include a stable chair  or a firm floor cushion. Sit upright with your back straight. Let your arms fall next to your sides, with your hands resting on your legs. If you are sitting in a chair, rest your feet flat on the  floor. If you are sitting on a cushion, cross your legs in front of you. Keep your head in a neutral position with your chin dropped slightly. Relax your jaw and rest the tip of your tongue on the roof of your mouth. Drop your gaze to the floor or close your eyes. Breathe normally and pay attention to your breath. Feel the air moving in and out of your nose. Feel your belly expanding and relaxing with each breath. Your mind may wander, and that is okay. Make note of when your thoughts drift, and return your attention to your breath. Avoid judging yourself, your feelings, or your surroundings. Make note of any judgment or feelings that come up, let them go, and bring your attention back to your breath. When you are ready, lift your gaze or open your eyes. Pay attention to how your body feels after the meditation. Follow these instructions at home:  Find a local in-person or online MBSR program. Set aside some time regularly for mindfulness practice. Practice every day if you can. Even 10 minutes of practice is helpful. Find a mindfulness practice that works best for you. This may include one or more of the following: Meditation. This involves focusing your mind on a certain thought or activity. Breathing awareness exercises. These help you to stay present by focusing on your breath. Body scan. For this practice, you lie down and pay attention to each part of your body from head to toe. You can identify tension and soreness and consciously relax parts of your body. Yoga. Yoga involves stretching and breathing, and it can improve your ability to move and be flexible. It can also help you to test your body's limits, which can help you release stress. Mindful eating. This way of eating involves focusing on the taste, texture, color, and smell of each bite of food. This slows down eating and helps you feel full sooner. For this reason, it can be an important part of a weight loss plan. Find a podcast or  recording that provides guidance for breathing awareness, body scan, or meditation exercises. You can listen to these any time when you have a free moment to rest without distractions. Follow your treatment plan as told by your health care provider. This may include taking regular medicines and making changes to your diet or lifestyle as recommended. Where to find more information You can find more information about MBSR from: Your health care provider. Community-based meditation centers or programs. Programs offered near you. Summary Mindfulness-based stress reduction (MBSR) is a program that teaches you how to consciously pay attention to the present moment. It is used to help you deal better with daily stress, feelings, and pain. MBSR focuses on developing self-awareness, which allows you to respond to life stress without judgment or negative feelings. MBSR programs may involve learning different mindfulness practices, such as breathing exercises, meditation, yoga, body scan, or mindful eating. Find a mindfulness practice that works best for you, and set aside time for it on a regular basis. This information is not intended to replace advice given to you by your health care provider. Make sure you discuss any questions you have with your health care provider. Document Revised: 08/14/2020 Document Reviewed: 08/14/2020 [employer] Patient Education  2024 [employer] Exercises to do While Sitting  Exercises that you do while sitting (chair exercises) can give you many of the same benefits as full exercise. Benefits include strengthening your heart, burning calories, and keeping muscles and joints healthy. Exercise can also improve your mood and help with depression and anxiety. You may benefit from chair exercises if you are unable to do standing exercises due to: Diabetic foot pain. Obesity. Illness. Arthritis. Recovery from surgery or injury. Breathing problems. Balance problems. Another  type of disability. Before starting chair exercises, check with your health care provider or a physical therapist to find out how much exercise you can tolerate and which exercises are safe for you. If your health care provider approves: Start out slowly and build up over time. Aim to work up to about 10-20 minutes for each exercise session. Make exercise part of your daily routine. Drink water when you exercise. Do not wait until you are thirsty. Drink every 10-15 minutes. Stop exercising right away if you have pain, nausea, shortness of breath, or dizziness. If you are exercising in a wheelchair, make sure to lock the wheels. Ask your health care provider whether you can do tai chi or yoga. Many positions in these mind-body exercises can be modified to do while seated. Warm-up Before starting other exercises: Sit up as straight as you can. Have your knees bent at 90 degrees, which is the shape of the capital letter "L." Keep your feet flat on the floor. Sit at the front edge of your chair, if you can. Pull in (tighten) the muscles in your abdomen and stretch your spine and neck as straight as you can. Hold this position for a few minutes. Breathe in and out evenly. Try to concentrate on your breathing, and relax your mind. Stretching Exercise A: Arm stretch Hold your arms out straight in front of your body. Bend your hands at the wrist with your fingers pointing up, as if signaling someone to stop. Notice the slight tension in your forearms as you hold the position. Keeping your arms out and your hands bent, rotate your hands outward as far as you can and hold this stretch. Aim to have your thumbs pointing up and your pinkie fingers pointing down. Slowly repeat arm stretches for one minute as tolerated. Exercise B: Leg stretch If you can move your legs, try to "draw" letters on the floor with the toes of your foot. Write your name with one foot. Write your name with the toes of your other  foot. Slowly repeat the movements for one minute as tolerated. Exercise C: Reach for the sky Reach your hands as far over your head as you can to stretch your spine. Move your hands and arms as if you are climbing a rope. Slowly repeat the movements for one minute as tolerated. Range of motion exercises Exercise A: Shoulder roll Let your arms hang loosely at your sides. Lift just your shoulders up toward your ears, then let them relax back down. When your shoulders feel loose, rotate your shoulders in backward and forward circles. Do shoulder rolls slowly for one minute as tolerated. Exercise B: March in place As if you are marching, pump your arms and lift your legs up and down. Lift your knees as high as you can. If you are unable to lift your knees, just pump your arms and move your ankles and feet up and down. March in place for one minute as tolerated. Exercise C: Seated jumping jacks Let your  arms hang down straight. Keeping your arms straight, lift them up over your head. Aim to point your fingers to the ceiling. While you lift your arms, straighten your legs and slide your heels along the floor to your sides, as wide as you can. As you bring your arms back down to your sides, slide your legs back together. If you are unable to use your legs, just move your arms. Slowly repeat seated jumping jacks for one minute as tolerated. Strengthening exercises Exercise A: Shoulder squeeze Hold your arms straight out from your body to your sides, with your elbows bent and your fists pointed at the ceiling. Keeping your arms in the bent position, move them forward so your elbows and forearms meet in front of your face. Open your arms back out as wide as you can with your elbows still bent, until you feel your shoulder blades squeezing together. Hold for 5 seconds. Slowly repeat the movements forward and backward for one minute as tolerated. Contact a health care provider if: You have to stop  exercising due to any of the following: Pain. Nausea. Shortness of breath. Dizziness. Fatigue. You have significant pain or soreness after exercising. Get help right away if: You have chest pain. You have difficulty breathing. These symptoms may represent a serious problem that is an emergency. Do not wait to see if the symptoms will go away. Get medical help right away. Call your local emergency services (911 in the [place] Do not drive yourself to the hospital. Summary Exercises that you do while sitting (chair exercises) can strengthen your heart, burn calories, and keep muscles and joints healthy. You may benefit from chair exercises if you are unable to do standing exercises due to diabetic foot pain, obesity, recovery from surgery or injury, or other conditions. Before starting chair exercises, check with your health care provider or a physical therapist to find out how much exercise you can tolerate and which exercises are safe for you. This information is not intended to replace advice given to you by your health care provider. Make sure you discuss any questions you have with your health care provider. Document Revised: 03/02/2020 Document Reviewed: 03/02/2020 [employer] Patient Education  2024 [employer].

## 2023-02-09 LAB — CBC
Hematocrit: 38.1 % (ref 34.0–46.6)
Hemoglobin: 12.9 g/dL (ref 11.1–15.9)
MCH: 29.5 pg (ref 26.6–33.0)
MCHC: 33.9 g/dL (ref 31.5–35.7)
MCV: 87 fL (ref 79–97)
Platelets: 89 10*3/uL — CL (ref 150–450)
RBC: 4.37 x10E6/uL (ref 3.77–5.28)
RDW: 12.4 % (ref 11.7–15.4)
WBC: 3.7 10*3/uL (ref 3.4–10.8)

## 2023-02-09 LAB — BASIC METABOLIC PANEL
BUN/Creatinine Ratio: 29 — ABNORMAL HIGH (ref 12–28)
BUN: 20 mg/dL (ref 8–27)
CO2: 22 mmol/L (ref 20–29)
Calcium: 9.7 mg/dL (ref 8.7–10.3)
Chloride: 106 mmol/L (ref 96–106)
Creatinine, Ser: 0.68 mg/dL (ref 0.57–1.00)
Glucose: 92 mg/dL (ref 70–99)
Potassium: 4.3 mmol/L (ref 3.5–5.2)
Sodium: 141 mmol/L (ref 134–144)
eGFR: 91 mL/min/{1.73_m2} (ref >59)

## 2023-03-09 ENCOUNTER — Other Ambulatory Visit (HOSPITAL_BASED_OUTPATIENT_CLINIC_OR_DEPARTMENT_OTHER): Payer: Self-pay | Admitting: Cardiology

## 2023-03-09 DIAGNOSIS — I5032 Chronic diastolic (congestive) heart failure: Secondary | ICD-10-CM

## 2023-03-09 DIAGNOSIS — I10 Essential (primary) hypertension: Secondary | ICD-10-CM

## 2023-03-22 ENCOUNTER — Other Ambulatory Visit: Payer: Self-pay | Admitting: Nurse Practitioner

## 2023-03-22 DIAGNOSIS — I8510 Secondary esophageal varices without bleeding: Secondary | ICD-10-CM

## 2023-03-22 DIAGNOSIS — C220 Liver cell carcinoma: Secondary | ICD-10-CM

## 2023-03-22 DIAGNOSIS — K7469 Other cirrhosis of liver: Secondary | ICD-10-CM

## 2023-04-06 ENCOUNTER — Encounter: Payer: Self-pay | Admitting: Hematology and Oncology

## 2023-04-11 ENCOUNTER — Inpatient Hospital Stay: Admission: RE | Admit: 2023-04-11 | Source: Ambulatory Visit

## 2023-04-11 ENCOUNTER — Other Ambulatory Visit

## 2023-04-27 ENCOUNTER — Ambulatory Visit
Admission: RE | Admit: 2023-04-27 | Discharge: 2023-04-27 | Disposition: A | Discharge status: Home / Self Care | Source: Ambulatory Visit | Attending: Nurse Practitioner | Admitting: Nurse Practitioner

## 2023-04-27 DIAGNOSIS — I8510 Secondary esophageal varices without bleeding: Secondary | ICD-10-CM

## 2023-04-27 DIAGNOSIS — K7469 Other cirrhosis of liver: Secondary | ICD-10-CM

## 2023-04-27 DIAGNOSIS — C220 Liver cell carcinoma: Secondary | ICD-10-CM

## 2023-04-27 MED ORDER — GADOPICLENOL 0.5 MMOL/ML IV SOLN
9.0000 mL | Freq: Once | INTRAVENOUS | Status: AC | PRN
  Administered 2023-04-27: 9 mL via INTRAVENOUS

## 2023-05-12 ENCOUNTER — Other Ambulatory Visit: Payer: Self-pay | Admitting: Hematology and Oncology

## 2023-05-12 ENCOUNTER — Inpatient Hospital Stay (HOSPITAL_BASED_OUTPATIENT_CLINIC_OR_DEPARTMENT_OTHER): Payer: Medicare Other | Admitting: Hematology and Oncology

## 2023-05-12 ENCOUNTER — Inpatient Hospital Stay: Payer: Medicare Other | Attending: Hematology and Oncology

## 2023-05-12 VITALS — BP 143/54 | HR 57 | Temp 98.0°F | Resp 13 | Wt 183.2 lb

## 2023-05-12 DIAGNOSIS — C220 Liver cell carcinoma: Secondary | ICD-10-CM

## 2023-05-12 DIAGNOSIS — Z79899 Other long term (current) drug therapy: Secondary | ICD-10-CM | POA: Insufficient documentation

## 2023-05-12 DIAGNOSIS — Z7982 Long term (current) use of aspirin: Secondary | ICD-10-CM | POA: Insufficient documentation

## 2023-05-12 DIAGNOSIS — D6959 Other secondary thrombocytopenia: Secondary | ICD-10-CM | POA: Insufficient documentation

## 2023-05-12 DIAGNOSIS — D696 Thrombocytopenia, unspecified: Secondary | ICD-10-CM

## 2023-05-12 DIAGNOSIS — D72819 Decreased white blood cell count, unspecified: Secondary | ICD-10-CM | POA: Insufficient documentation

## 2023-05-12 LAB — CBC WITH DIFFERENTIAL (CANCER CENTER ONLY)
Abs Immature Granulocytes: 0.01 10*3/uL (ref 0.00–0.07)
Basophils Absolute: 0 10*3/uL (ref 0.0–0.1)
Basophils Relative: 1 %
Eosinophils Absolute: 0.3 10*3/uL (ref 0.0–0.5)
Eosinophils Relative: 6 %
HCT: 36.1 % (ref 36.0–46.0)
Hemoglobin: 12.3 g/dL (ref 12.0–15.0)
Immature Granulocytes: 0 %
Lymphocytes Relative: 20 %
Lymphs Abs: 1 10*3/uL (ref 0.7–4.0)
MCH: 29 pg (ref 26.0–34.0)
MCHC: 34.1 g/dL (ref 30.0–36.0)
MCV: 85.1 fL (ref 80.0–100.0)
Monocytes Absolute: 0.4 10*3/uL (ref 0.1–1.0)
Monocytes Relative: 8 %
Neutro Abs: 3.1 10*3/uL (ref 1.7–7.7)
Neutrophils Relative %: 65 %
Platelet Count: 86 10*3/uL — ABNORMAL LOW (ref 150–400)
RBC: 4.24 MIL/uL (ref 3.87–5.11)
RDW: 14.8 % (ref 11.5–15.5)
WBC Count: 4.8 10*3/uL (ref 4.0–10.5)
nRBC: 0 % (ref 0.0–0.2)

## 2023-05-12 LAB — CMP (CANCER CENTER ONLY)
ALT: 52 U/L — ABNORMAL HIGH (ref 0–44)
AST: 61 U/L — ABNORMAL HIGH (ref 15–41)
Albumin: 3.8 g/dL (ref 3.5–5.0)
Alkaline Phosphatase: 106 U/L (ref 38–126)
Anion gap: 4 — ABNORMAL LOW (ref 5–15)
BUN: 19 mg/dL (ref 8–23)
CO2: 28 mmol/L (ref 22–32)
Calcium: 9.6 mg/dL (ref 8.9–10.3)
Chloride: 109 mmol/L (ref 98–111)
Creatinine: 0.81 mg/dL (ref 0.44–1.00)
GFR, Estimated: >60 mL/min (ref >60)
Glucose, Bld: 79 mg/dL (ref 70–99)
Potassium: 4.6 mmol/L (ref 3.5–5.1)
Sodium: 141 mmol/L (ref 135–145)
Total Bilirubin: 0.6 mg/dL (ref 0.0–1.2)
Total Protein: 6.9 g/dL (ref 6.5–8.1)

## 2023-05-12 NOTE — Progress Notes (Signed)
 [facility] [phone]   [phone]  PROGRESS NOTE  Patient Care Team: Edda Goo, MD as PCP - General (Internal Medicine) Sheryle Donning, MD as PCP - Cardiology (Cardiology) Ander Bame, MD as Consulting Physician (Hematology and Oncology)  Hematological/Oncological History # Hepatocellular Carcinoma, Stage 1b 04/13/2022: Patient underwent a CT scan of the chest wall emergency department for hemoptysis.  The scan showed no clear source of the bleeding but the report noted hepatic cirrhosis with enlarging heterogeneous enhancing lesion centrally in the right hepatic lobe, suspicious for hepatocellular carcinoma. Recommend further evaluation with abdominal MRI without and with contrast. 04/22/2022: MRI abdomen performed which showed. 3.1 x 3.1 cm early arterial phase enhancing lesion in segment 8 with subsequent washout and capsular enhancement. Findings consistent with hepatocellular carcinoma, LI-RADS 5 07/05/2022: started evaluation for liver transplant at [facility].  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 Rosaline Coma     Interval History:  [name] 73 y.o. female with medical history significant for newly diagnosed [facility] who presents for a follow up visit. The patient's last visit was on 11/10/2022. In the interim since the last visit she underwent an MRI which showed cirrhosis and an ablation zone without evidence of viable tumor.  On exam today [name] reports she has had no major changes in her health in the interim since her last visit.  She did have a COVID infection about 5 months ago and also flu infection in the interim  since her last visit.  She has not had any norovirus.  She notes that her energy is about a 5 out of 10 today.  She reports that she gets tired "makes her self do stuff".  Her appetite has been strong.  She has not had knee pain or abdominal swelling.  She notes that she is not having any nausea, vomiting, or diarrhea.  She also denies any bleeding, bruising, or dark stools.  She has been having some trouble with the pollen allergies.  Overall she has been well and has no questions concerns or complaints today.  A full 10 point ROS is otherwise negative.  MEDICAL HISTORY:  Past Medical History:  Diagnosis Date   Anxiety    Arthritis    Asthma    Blood dyscrasia    Thrombocytopenia   CAD (coronary artery disease)    CHF (congestive heart failure) (HCC)    Complication of anesthesia    trouble waking up once with Nissen Fundoplication   COPD (chronic obstructive pulmonary disease) (HCC)    Depression    Diabetes mellitus without complication (HCC)    Dysrhythmia    Heart murmur    hepatocellular ca    Hypertension    Hypothyroidism    Liver disease    NAFLD cirrhosis   Mild aortic stenosis    Pneumonia    Sarcoidosis    Sarcoidosis of lung (HCC)    Sarcoidosis of lymph nodes     SURGICAL HISTORY: Past Surgical History:  Procedure Laterality Date   ABDOMINAL HYSTERECTOMY     ABDOMINAL SURGERY     CHOLECYSTECTOMY     ESOPHAGOGASTRODUODENOSCOPY (EGD) WITH PROPOFOL  N/A 04/17/2021   Procedure: ESOPHAGOGASTRODUODENOSCOPY (EGD) WITH PROPOFOL ;  Surgeon: Alvis Jourdain, MD;  Location: [facility];  Service: Gastroenterology;  Laterality: N/A;   HERNIA REPAIR     IR 3D INDEPENDENT WKST  07/21/2022   IR ANGIOGRAM SELECTIVE EACH ADDITIONAL VESSEL  07/21/2022   IR ANGIOGRAM VISCERAL SELECTIVE  07/21/2022   IR EMBO TUMOR ORGAN ISCHEMIA INFARCT INC GUIDE ROADMAPPING  07/21/2022   IR GUIDED LIVER TUMOR ABLATION RFA [facility]  07/21/2022   IR US  GUIDE VASC ACCESS RIGHT  07/21/2022   LASIK  2001   LEFT  HEART CATH AND CORONARY ANGIOGRAPHY N/A 09/23/2020   Procedure: LEFT HEART CATH AND CORONARY ANGIOGRAPHY;  Surgeon: Millicent Ally, MD;  Location: [facility];  Service: Cardiovascular;  Laterality: N/A;   nissenfundiplication     RADIOLOGY WITH ANESTHESIA N/A 07/21/2022   Procedure: IR WITH ANESTHESIA MICROWAVE ABLATION;  Surgeon: Robbi Childs, MD;  Location: [facility];  Service: Anesthesiology;  Laterality: N/A;   TOTAL KNEE ARTHROPLASTY Right 05/19/2021   Procedure: TOTAL KNEE ARTHROPLASTY;  Surgeon: Claiborne Crew, MD;  Location: [facility];  Service: Orthopedics;  Laterality: Right;    SOCIAL HISTORY: Social History   Socioeconomic History   Marital status: Married    Spouse name: Not on file   Number of children: Not on file   Years of education: Not on file   Highest education level: Not on file  Occupational History   Not on file  Tobacco Use   Smoking status: Never    Passive exposure: Past (minimal)   Smokeless tobacco: Never  Vaping Use   Vaping status: Never Used  Substance and Sexual Activity   Alcohol use: No   Drug use: No   Sexual activity: Not Currently  Other Topics Concern   Not on file  Social History Narrative   Not on file   Social Drivers of Health   Financial Resource Strain: Not on file  Food Insecurity: No Food Insecurity (07/24/2022)   Hunger Vital Sign    Worried About Running Out of Food in the Last Year: Never true    Ran Out of Food in the Last Year: Never true  Transportation Needs: No Transportation Needs (07/24/2022)   PRAPARE - Administrator, Civil Service (Medical): No    Lack of Transportation (Non-Medical): No  Physical Activity: Not on file  Stress: Not on file  Social Connections: Not on file  Intimate Partner Violence: Not At Risk (07/24/2022)   Humiliation, Afraid, Rape, and Kick questionnaire    Fear of Current or Ex-Partner: No    Emotionally Abused: No    Physically Abused: No    Sexually Abused: No    FAMILY  HISTORY: Family History  Problem Relation Age of Onset   Diabetes Mother    Heart attack Mother    Heart failure Father    Hypertension Father    Heart attack Father    Heart Problems Sister    Blindness Sister    Diabetes Sister    Hypertension Sister    Heart attack Brother    Heart attack Brother    Throat cancer Brother    Healthy Son    Rheum arthritis Daughter     ALLERGIES:  is allergic to sulfa antibiotics, tape, atorvastatin , astelin  [azelastine ], and amoxicillin.  MEDICATIONS:  Current Outpatient Medications  Medication Sig Dispense Refill   acetaminophen  (TYLENOL ) 325 MG tablet Take 325-650 mg by mouth every 6 (six) hours as needed for mild pain, moderate pain or headache.  acyclovir (ZOVIRAX) 400 MG tablet Take 400 mg by mouth every 8 (eight) hours as needed (fever blisters). Fever blisters     albuterol  (VENTOLIN  HFA) 108 (90 Base) MCG/ACT inhaler Inhale 2 puffs into the lungs every 6 (six) hours as needed. (Patient taking differently: Inhale 2 puffs into the lungs every 6 (six) hours as needed for shortness of breath or wheezing.) 18 g 5   ALPRAZolam  (XANAX ) 0.25 MG tablet Take 0.25 mg by mouth 3 (three) times daily as needed for anxiety.     amLODipine  (NORVASC ) 10 MG tablet Take 1 tablet by mouth once daily 90 tablet 3   aspirin  EC 81 MG tablet Take 81 mg by mouth daily. Swallow whole.     Biotin 10 MG CAPS Take 10 mg by mouth daily.     carvedilol  (COREG ) 25 MG tablet TAKE 1 TABLET BY MOUTH TWICE DAILY WITH A MEAL 180 tablet 3   cetirizine (ZYRTEC) 10 MG tablet Take 10 mg by mouth daily.     docusate sodium  (COLACE) 100 MG capsule Take 1 capsule (100 mg total) by mouth 2 (two) times daily. (Patient taking differently: Take 200 mg by mouth daily.) 10 capsule 0   fluticasone  (FLONASE ) 50 MCG/ACT nasal spray Place 1 spray into both nostrils daily. (Patient taking differently: Place 1 spray into both nostrils at bedtime.) 16 g 11   furosemide  (LASIX ) 20 MG tablet  Take 20-40 mg by mouth See admin instructions. Take 20 mg by mouth in the morning and increase dose to 40 mg for swelling or edema     gabapentin  (NEURONTIN ) 300 MG capsule Take 300 mg by mouth 2 (two) times daily.     irbesartan  (AVAPRO ) 150 MG tablet Take 1 tablet by mouth once daily 90 tablet 0   levothyroxine  (SYNTHROID , LEVOTHROID) 150 MCG tablet Take 75-150 mcg by mouth See admin instructions. Take 150 mcg by mouth in the morning 30 minutes before breakfast and ALTERNATE with 150 mcg EVERY OTHER DAY     metFORMIN  (GLUCOPHAGE -XR) 500 MG 24 hr tablet Take 2 tablets (1,000 mg total) by mouth 2 (two) times daily. (Patient taking differently: Take 500 mg by mouth daily with breakfast.) 120 tablet 3   rosuvastatin  (CRESTOR ) 10 MG tablet Take 10 mg by mouth daily.     Semaglutide,0.25 or 0.5MG /DOS, (OZEMPIC, 0.25 OR 0.5 MG/DOSE,) 2 MG/3ML SOPN Inject 0.5 mg into the skin See admin instructions. Inject 0.5 mg into the skin every 7-10 days     spironolactone  (ALDACTONE ) 25 MG tablet Take 25 mg by mouth daily.     tiZANidine  (ZANAFLEX ) 4 MG tablet Take 2-4 mg by mouth every 8 (eight) hours as needed for muscle spasms.     traMADol (ULTRAM) 50 MG tablet Take 50 mg by mouth daily as needed for moderate pain.     zolpidem  (AMBIEN ) 10 MG tablet Take 10 mg by mouth at bedtime.     No current facility-administered medications for this visit.    REVIEW OF SYSTEMS:   Constitutional: ( - ) fevers, ( - )  chills , ( - ) night sweats Eyes: ( - ) blurriness of vision, ( - ) double vision, ( - ) watery eyes Ears, nose, mouth, throat, and face: ( - ) mucositis, ( - ) sore throat Respiratory: ( - ) cough, ( - ) dyspnea, ( - ) wheezes Cardiovascular: ( - ) palpitation, ( - ) chest discomfort, ( - ) lower extremity swelling Gastrointestinal:  ( - ) nausea, ( - )  heartburn, ( - ) change in bowel habits Skin: ( - ) abnormal skin rashes Lymphatics: ( - ) new lymphadenopathy, ( - ) easy bruising Neurological: ( - )  numbness, ( - ) tingling, ( - ) new weaknesses Behavioral/Psych: ( - ) mood change, ( - ) new changes  All other systems were reviewed with the patient and are negative.  PHYSICAL EXAMINATION: ECOG PERFORMANCE STATUS: 0 - Asymptomatic  Vitals:   05/12/23 1422  BP: (!) 143/54  Pulse: (!) 57  Resp: 13  Temp: 98 F (36.7 C)  SpO2: 99%     Filed Weights   05/12/23 1422  Weight: 183 lb 3.2 oz (83.1 kg)      GENERAL: Well-appearing elderly Caucasian female, alert, no distress and comfortable SKIN: skin color, texture, turgor are normal, no rashes or significant lesions EYES: conjunctiva are pink and non-injected, sclera clear LUNGS: clear to auscultation and percussion with normal breathing effort HEART: regular rate & rhythm and no murmurs and no lower extremity edema Musculoskeletal: no cyanosis of digits and no clubbing  PSYCH: alert & oriented x 3, fluent speech NEURO: no focal motor/sensory deficits  LABORATORY DATA:  I have reviewed the data as listed    Latest Ref Rng & Units 05/12/2023    1:56 PM 02/08/2023   11:27 AM 11/10/2022   10:41 AM  CBC  WBC 4.0 - 10.5 K/uL 4.8  3.7  3.3   Hemoglobin 12.0 - 15.0 g/dL 40.9  81.1  91.4   Hematocrit 36.0 - 46.0 % 36.1  38.1  39.7   Platelets 150 - 400 K/uL 86  89  86        Latest Ref Rng & Units 05/12/2023    1:56 PM 02/08/2023   11:27 AM 11/10/2022   10:41 AM  CMP  Glucose 70 - 99 mg/dL 79  92  782   BUN 8 - 23 mg/dL 19  20  26    Creatinine 0.44 - 1.00 mg/dL 9.56  2.13  0.86   Sodium 135 - 145 mmol/L 141  141  140   Potassium 3.5 - 5.1 mmol/L 4.6  4.3  4.9   Chloride 98 - 111 mmol/L 109  106  109   CO2 22 - 32 mmol/L 28  22  27    Calcium  8.9 - 10.3 mg/dL 9.6  9.7  57.8   Total Protein 6.5 - 8.1 g/dL 6.9   7.5   Total Bilirubin 0.0 - 1.2 mg/dL 0.6   0.5   Alkaline Phos 38 - 126 U/L 106   132   AST 15 - 41 U/L 61   59   ALT 0 - 44 U/L 52   58     RADIOGRAPHIC STUDIES: MR ABDOMEN WWO CONTRAST Result Date:  04/27/2023 CLINICAL DATA:  Cirrhosis, hepatocellular carcinoma, status post ablation of hepatic segment VIII EXAM: MRI ABDOMEN WITHOUT AND WITH CONTRAST TECHNIQUE: Multiplanar multisequence MR imaging of the abdomen was performed both before and after the administration of intravenous contrast. CONTRAST:  9 mL Vueway  gadolinium contrast IV COMPARISON:  10/22/2022 FINDINGS: Lower chest: No acute abnormality.  Small hiatal hernia. Hepatobiliary: Coarse, nodular cirrhotic morphology of the liver. Hepatic steatosis. Unchanged, intrinsically T1 hyperintense, nonenhancing ablation site of central hepatic segment VIII measuring 2.5 x 1.8 cm (series 12, image 21, series 14, image 21). No new or suspicious hyperenhancing liver lesions. Diffusely heterogeneous, nodular background parenchymal enhancement. Status post cholecystectomy. Unchanged, mild postoperative biliary ductal dilatation. Pancreas: Unremarkable. No pancreatic ductal  dilatation or surrounding inflammatory changes. Spleen: Unchanged splenomegaly, maximum coronal span 15.2 cm (series 3, image 9). Adrenals/Urinary Tract: Adrenal glands are unremarkable. Kidneys are normal, without renal calculi, solid lesion, or hydronephrosis. Stomach/Bowel: Stomach is within normal limits. No evidence of bowel wall thickening, distention, or inflammatory changes. Vascular/Lymphatic: Aortic atherosclerosis. No enlarged abdominal lymph nodes. Other: No abdominal wall hernia or abnormality. No ascites. Musculoskeletal: No acute or significant osseous findings. IMPRESSION: 1. Unchanged, intrinsically T1 hyperintense, nonenhancing ablation site of central hepatic segment VIII. LI-RADS TR nonviable. 2. No new or suspicious hyperenhancing liver lesions. 3. Cirrhosis, hepatic steatosis, and splenomegaly. 4. Status post cholecystectomy. Aortic Atherosclerosis (ICD10-I70.0). Electronically Signed   By: Fredricka Jenny M.D.   On: 04/27/2023 12:39    ASSESSMENT & PLAN [name] 73 y.o.  female with medical history significant for newly diagnosed [facility] who presents for a follow up visit.   # Hepatocellular Carcinoma, Stage 1b -- Diagnosis confirmed based on the MRI imaging. --AFP, CEA, and CA 19-9 were WNL prior to intervention. No utility in monitoring these.  -- currently under evaluation for liver transplant by [facility]. Follows with them routinely.  -- underwent embolization/microwave ablation on 07/21/2022.  -- Labs today show white blood cell count 4.8, Hgb 12.3, MCV 85.1, Plt 86. Creatinine was 0.81 with normal LFTs. -- MRI of the liver on 04/27/2023 showed an unchanged ablation zone without evidence of viable tumor, no other findings concerning for recurrent or metastatic HCC. Plan for repeat scan in 6 months time.  --RTC in 6 months.   # Leukopenia/Thrombocytopenia -- Secondary to cirrhosis. -- Appears stable compared to have declined but stable over last 6 months, currently at 86  No orders of the defined types were placed in this encounter.   All questions were answered. The patient knows to call the clinic with any problems, questions or concerns.  A total of more than 30 minutes were spent on this encounter with face-to-face time and non-face-to-face time, including preparing to see the patient, ordering tests and/or medications, counseling the patient and coordination of care as outlined above.   Rogerio Clay, MD Department of Hematology/Oncology [facility] at [facility] Phone: [phone] Pager: [phone] Email: [email or web address]  05/12/2023 2:38 PM

## 2023-05-13 LAB — AFP TUMOR MARKER: AFP, Serum, Tumor Marker: 2.6 ng/mL (ref 0.0–9.2)

## 2023-05-25 ENCOUNTER — Ambulatory Visit (HOSPITAL_BASED_OUTPATIENT_CLINIC_OR_DEPARTMENT_OTHER): Payer: Medicare Other | Admitting: Cardiology

## 2023-05-25 ENCOUNTER — Encounter (HOSPITAL_BASED_OUTPATIENT_CLINIC_OR_DEPARTMENT_OTHER): Payer: Self-pay | Admitting: Cardiology

## 2023-05-25 VITALS — BP 132/66 | HR 66 | Ht 62.0 in | Wt 184.7 lb

## 2023-05-25 DIAGNOSIS — I2510 Atherosclerotic heart disease of native coronary artery without angina pectoris: Secondary | ICD-10-CM

## 2023-05-25 DIAGNOSIS — K7469 Other cirrhosis of liver: Secondary | ICD-10-CM

## 2023-05-25 DIAGNOSIS — E782 Mixed hyperlipidemia: Secondary | ICD-10-CM | POA: Diagnosis not present

## 2023-05-25 DIAGNOSIS — I10 Essential (primary) hypertension: Secondary | ICD-10-CM

## 2023-05-25 DIAGNOSIS — C220 Liver cell carcinoma: Secondary | ICD-10-CM

## 2023-05-25 DIAGNOSIS — R0789 Other chest pain: Secondary | ICD-10-CM | POA: Diagnosis not present

## 2023-05-25 DIAGNOSIS — I340 Nonrheumatic mitral (valve) insufficiency: Secondary | ICD-10-CM

## 2023-05-25 NOTE — Progress Notes (Signed)
 Cardiology Office Note:  .   Date:  05/25/2023  ID:  [name], DOB 1950/12/19, MRN [id number] PCP: Edda Goo, MD  [facility] Providers Cardiologist:  Sheryle Donning, MD {  History of Present Illness: [name]   [name] is a 73 y.o. female  with a hx of HCC s/p IR ablation, end stage liver disease, type II diabetes, hypertension, sarcoidosis who is seen for follow up. She was initially seen 02/08/18 for chest pain.   Today: Last night had severe sharp chest pain, similar to her prior GI pain, took a lot of maalox. Ate very little yesterday. No hematemesis. Had workup for atypical chest pain similarly in 10/2022, unremarkable (CTPE, troponins, echo). Seen for same with Lawana Pray 01/2023. Had cath in 2022 with almost no CAD (20% in prox Lcx).  From a liver perspective, ablation went well, follow up MRI was stable. She is hoping to not need liver transplant.  Has had intermittent low BP, to 90s/40s, so she moved spironolactone  to later in the day, and Bps have been more stable.   ROS: Denies shortness of breath at rest or with normal exertion. No PND, orthopnea, LE edema or unexpected weight gain. No syncope or palpitations. ROS otherwise negative except as noted.   Studies Reviewed: [name]    EKG:     not ordered today  Physical Exam:   VS:  BP 132/66   Pulse 66   Ht 5\' 2"  (1.575 m)   Wt 184 lb 11.2 oz (83.8 kg)   SpO2 97%   BMI 33.78 kg/m    Wt Readings from Last 3 Encounters:  05/25/23 184 lb 11.2 oz (83.8 kg)  05/12/23 183 lb 3.2 oz (83.1 kg)  02/08/23 183 lb (83 kg)    GEN: Well nourished, well developed in no acute distress HEENT: Normal, moist mucous membranes NECK: No JVD CARDIAC: regular rhythm, normal S1 and S2, no rubs or gallops. 2/6 systolic murmur. Mild TTP mid anterior chest VASCULAR: Radial and DP pulses 2+ bilaterally. No carotid bruits RESPIRATORY:  Clear to auscultation without rales, wheezing or rhonchi  ABDOMEN: Soft, non-tender,  non-distended MUSCULOSKELETAL:  Ambulates independently SKIN: Warm and dry, no edema NEUROLOGIC:  Alert and oriented x 3. No focal neuro deficits noted. PSYCHIATRIC:  Normal affect    ASSESSMENT AND PLAN: .    Atypical chest pain -tender to palpation today -has been attributed to GI before, notes improvement in the past with maalox/lidocaine  mix in ER -see prior workup above, unremarkable. -reviewed red flag warning signs that need immediate medical attention  End stage liver disease Hepatocellular carcinoma -followed by [facility], hoping not to need transplant -s/p embolization of HCC  hypertension -continue amlodipine  10 mg daily, carvedilol  25 mg BID, furosemide  20-40 mg based on the day, irbesartan  150 mg daily, spironolactone  25 mg daily -see prior notes; would not use hydralazine  unless blood pressure severely elevated until we have a better understanding of her ANA.  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 Murmur quieter. -continue furosemide , irbesartan . Avoid dehydration/hyperdynamic LV given septal hypertrophy/SAM -no syncope  CV risk counseling and prevention -  recommend heart healthy/Mediterranean diet, with whole grains, fruits, vegetable, fish, lean meats, nuts, and olive oil. Limit salt. -recommend moderate walking, 3-5 times/week for 30-50 minutes each session. Aim for at least 150 minutes.week. Goal should be pace of 3 miles/hours, or walking 1.5 miles in 30 minutes -recommend avoidance of tobacco products. Avoid excess alcohol.    Dispo: 1 year or sooner as needed  Signed, Sheryle Donning, MD   Sheryle Donning, MD, PhD, [facility]  [facility]  [facility] at [facility] at [facility] [address] [place], [place] [address] [phone]

## 2023-05-25 NOTE — Patient Instructions (Signed)
 Medication Instructions:  Your physician recommends that you continue on your current medications as directed. Please refer to the Current Medication list given to you today.   Follow-Up: Please follow up in 12 months with Dr. Veryl Gottron, Slater Duncan, NP or Neomi Banks, NP

## 2023-06-07 ENCOUNTER — Other Ambulatory Visit (HOSPITAL_BASED_OUTPATIENT_CLINIC_OR_DEPARTMENT_OTHER): Payer: Self-pay | Admitting: Cardiology

## 2023-06-07 DIAGNOSIS — I10 Essential (primary) hypertension: Secondary | ICD-10-CM

## 2023-06-07 DIAGNOSIS — I5032 Chronic diastolic (congestive) heart failure: Secondary | ICD-10-CM

## 2023-06-09 NOTE — Progress Notes (Signed)
 Office Visit Note  Patient: [name]             Date of Birth: January 31, 1950           MRN: [id number]             PCP: Edda Goo, MD Referring: Edda Goo, MD Visit Date: 06/23/2023 Occupation: @GUAROCC @  Subjective:  Joint stiffness  History of Present Illness: OTHELL DILUZIO is a 73 y.o. female with history of polyarthralgias, positive ANA and sarcoidosis.  She returns today after her last visit in June 2024.  She continues to have a stiffness and discomfort in her hands and her left knee joint.  Right total knee replacement is doing well.  She continues to have some stiffness in her neck.  She is followed by Dr. Dione Franks regarding the pulmonary sarcoidosis and has not had a flare.  She had microwave ablation and embolization for hepatocellular carcinoma.  She states she was not a candidate for liver transplant.  She continues to have some joint stiffness.  She denies history of oral ulcers, raynauds phenomenon phenomenon, photosensitivity, lymphadenopathy or inflammatory arthritis.  She continues to have some dry mouth and dry eye symptoms.    Activities of Daily Living:  Patient reports morning stiffness for 1 hour.   Patient Reports nocturnal pain.  Difficulty dressing/grooming: Denies Difficulty climbing stairs: Reports Difficulty getting out of chair: Denies Difficulty using hands for taps, buttons, cutlery, and/or writing: Denies  Review of Systems  Constitutional:  Positive for fatigue.  HENT:  Positive for mouth sores and mouth dryness.        Fever blisters  Eyes:  Positive for dryness.  Respiratory:  Positive for shortness of breath.   Cardiovascular:  Positive for chest pain. Negative for palpitations.  Gastrointestinal:  Positive for constipation. Negative for blood in stool and diarrhea.  Endocrine: Positive for increased urination.  Genitourinary:  Negative for involuntary urination.  Musculoskeletal:  Positive for joint pain, joint pain, joint swelling,  morning stiffness and muscle tenderness. Negative for gait problem, myalgias, muscle weakness and myalgias.  Skin:  Positive for hair loss. Negative for color change, rash and sensitivity to sunlight.  Allergic/Immunologic: Negative for susceptible to infections.  Neurological:  Negative for dizziness and headaches.  Hematological:  Positive for bruising/bleeding tendency. Negative for swollen glands.  Psychiatric/Behavioral:  Positive for depressed mood and sleep disturbance. The patient is nervous/anxious.     PMFS History:  Patient Active Problem List   Diagnosis Date Noted   Controlled type 2 diabetes mellitus without complication, without long-term current use of insulin  (HCC) 10/21/2022   Chronic diastolic CHF (congestive heart failure) (HCC) 10/21/2022   Hepatocellular carcinoma (HCC) 05/01/2022   Arthropathy of cervical facet joint 11/20/2021   Diastolic dysfunction 11/20/2021   Liver cirrhosis secondary to NASH (HCC) 11/20/2021   S/P total knee arthroplasty, right 05/19/2021   Systolic anterior movement of mitral valve 03/30/2021   Chest pain 09/23/2020   Thrombocytopenia (HCC) 09/23/2020   Dehydration 06/22/2012   AKI (acute kidney injury) (HCC) 06/22/2012   Hypothyroidism 06/22/2012   HTN (hypertension) 06/22/2012   Uncontrolled type 2 diabetes mellitus with hyperglycemia (HCC) 06/22/2012   Sarcoidosis 06/22/2012   Heart murmur 06/22/2012   UTI (urinary tract infection) 06/22/2012    Past Medical History:  Diagnosis Date   Anxiety    Arthritis    Asthma    Blood dyscrasia    Thrombocytopenia   CAD (coronary artery disease)    CHF (congestive  heart failure) (HCC)    Complication of anesthesia    trouble waking up once with Nissen Fundoplication   COPD (chronic obstructive pulmonary disease) (HCC)    Depression    Diabetes mellitus without complication (HCC)    Dysrhythmia    Heart murmur    hepatocellular ca    Hypertension    Hypothyroidism    Liver cancer  (HCC)    Liver disease    NAFLD cirrhosis   Mild aortic stenosis    Pneumonia    Sarcoidosis    Sarcoidosis of lung (HCC)    Sarcoidosis of lymph nodes     Family History  Problem Relation Age of Onset   Diabetes Mother    Heart attack Mother    Heart failure Father    Hypertension Father    Heart attack Father    Heart Problems Sister    Blindness Sister    Diabetes Sister    Hypertension Sister    Heart attack Brother    Heart attack Brother    Throat cancer Brother    Healthy Son    Rheum arthritis Daughter    Past Surgical History:  Procedure Laterality Date   ABDOMINAL HYSTERECTOMY     ABDOMINAL SURGERY     CHOLECYSTECTOMY     ESOPHAGOGASTRODUODENOSCOPY (EGD) WITH PROPOFOL  N/A 04/17/2021   Procedure: ESOPHAGOGASTRODUODENOSCOPY (EGD) WITH PROPOFOL ;  Surgeon: Alvis Jourdain, MD;  Location: [facility];  Service: Gastroenterology;  Laterality: N/A;   HERNIA REPAIR     IR 3D INDEPENDENT WKST  07/21/2022   IR ANGIOGRAM SELECTIVE EACH ADDITIONAL VESSEL  07/21/2022   IR ANGIOGRAM VISCERAL SELECTIVE  07/21/2022   IR EMBO TUMOR ORGAN ISCHEMIA INFARCT INC GUIDE ROADMAPPING  07/21/2022   IR GUIDED LIVER TUMOR ABLATION RFA [facility]  07/21/2022   IR US  GUIDE VASC ACCESS RIGHT  07/21/2022   LASIK  2001   LEFT HEART CATH AND CORONARY ANGIOGRAPHY N/A 09/23/2020   Procedure: LEFT HEART CATH AND CORONARY ANGIOGRAPHY;  Surgeon: Millicent Ally, MD;  Location: [facility];  Service: Cardiovascular;  Laterality: N/A;   nissenfundiplication     RADIOLOGY WITH ANESTHESIA N/A 07/21/2022   Procedure: IR WITH ANESTHESIA MICROWAVE ABLATION;  Surgeon: Robbi Childs, MD;  Location: [facility];  Service: Anesthesiology;  Laterality: N/A;   TOTAL KNEE ARTHROPLASTY Right 05/19/2021   Procedure: TOTAL KNEE ARTHROPLASTY;  Surgeon: Claiborne Crew, MD;  Location: [facility];  Service: Orthopedics;  Laterality: Right;   Social History   Social History Narrative   Not on file   Immunization History  Administered  Date(s) Administered   Fluad Quad(high Dose 65+) 11/23/2020     Objective: Vital Signs: BP (!) 192/78 (BP Location: Left Arm, Patient Position: Sitting, Cuff Size: Small)   Pulse 68   Ht 5\' 2"  (1.575 m)   Wt 184 lb (83.5 kg)   BMI 33.65 kg/m    Physical Exam Vitals and nursing note reviewed.  Constitutional:      Appearance: She is well-developed.  HENT:     Head: Normocephalic and atraumatic.  Eyes:     Conjunctiva/sclera: Conjunctivae normal.  Cardiovascular:     Rate and Rhythm: Normal rate and regular rhythm.     Heart sounds: Normal heart sounds.  Pulmonary:     Effort: Pulmonary effort is normal.     Breath sounds: Normal breath sounds.  Abdominal:     General: Bowel sounds are normal.     Palpations: Abdomen is soft.  Musculoskeletal:  Cervical back: Normal range of motion.  Lymphadenopathy:     Cervical: No cervical adenopathy.  Skin:    General: Skin is warm and dry.     Capillary Refill: Capillary refill takes less than 2 seconds.  Neurological:     Mental Status: She is alert and oriented to person, place, and time.  Psychiatric:        Behavior: Behavior normal.      Musculoskeletal Exam: She had limited lateral rotation of the cervical spine.  She had mild thoracic kyphosis.  There was no tenderness over thoracic or lumbar spine.  Shoulders, elbows, wrist, MCPs PIPs and DIPs with good range of motion.  She had bilateral PIP and DIP thickening with no synovitis.  Hip joints with good range of motion.  Right knee joint was replaced and had limited extension.  Left knee joint was in full range of motion with some discomfort.  No warmth swelling or effusion was noted.  There was no tenderness over ankles or MTPs.  CDAI Exam: CDAI Score: -- Patient Global: --; Provider Global: -- Swollen: --; Tender: -- Joint Exam 06/23/2023   No joint exam has been documented for this visit   There is currently no information documented on the homunculus. Go to the  Rheumatology activity and complete the homunculus joint exam.  Investigation: No additional findings.  Imaging: No results found.  Recent Labs: Lab Results  Component Value Date   WBC 4.8 05/12/2023   HGB 12.3 05/12/2023   PLT 86 (L) 05/12/2023   NA 141 05/12/2023   K 4.6 05/12/2023   CL 109 05/12/2023   CO2 28 05/12/2023   GLUCOSE 79 05/12/2023   BUN 19 05/12/2023   CREATININE 0.81 05/12/2023   BILITOT 0.6 05/12/2023   ALKPHOS 106 05/12/2023   AST 61 (H) 05/12/2023   ALT 52 (H) 05/12/2023   PROT 6.9 05/12/2023   ALBUMIN 3.8 05/12/2023   CALCIUM  9.6 05/12/2023   GFRAA >60 02/07/2018    Speciality Comments: No specialty comments available.  Procedures:  No procedures performed Allergies: Sulfa antibiotics, Tape, Atorvastatin , Astelin  [azelastine ], and Amoxicillin   Assessment / Plan:     Visit Diagnoses: Polyarthralgia-she continues to have pain and discomfort in some joints but overall the pain has improved.  She states the pain is mostly in the left knee joint.  Primary osteoarthritis of both hands -bilateral PIP and DIP thickening with no synovitis was noted.  X-rays obtained in the past were  consistent with osteoarthritis and generalized osteopenia.  Joint protection muscle strengthening was discussed.  S/P total knee arthroplasty, right - She had right total knee replacement on May 19, 2021 by Dr. Bernard Brick.  Doing well.  Primary osteoarthritis of left knee -she continues to have some discomfort in her left knee joint.  She is followed by orthopedics.  Arthropathy of cervical facet joint-she had limited lateral rotation of the cervical spine.  Positive ANA (antinuclear antibody) - Significantly high titer of ANA.  Most likely related to liver disease.  ENA negative, C3-C4 normal.  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  She is followed by Dr. Dione Franks.  Patient denies any increased shortness of breath.  Heart murmur - Aortic stenosis and moderate mitral  regurgitation.  Systolic anterior movement of mitral valve  Diastolic dysfunction - followed by by Dr. Veryl Gottron  Thrombocytopenia [facility]) - She was referred to hematology.  Dr. Rosaline Coma evaluated her on December 16, 2021.  According to his note that thrombocytopenia was related to cirrhosis.  Hepatocellular carcinoma [facility]) - stage Ib-diagnosed April 2024.  Patient had microwave ablation in embolization.  She states she is not a candidate for liver transplant.  Dyslipidemia  Other cirrhosis of liver (HCC) - Followed by Dr. Tova Fresh  Uncontrolled type 2 diabetes mellitus with hyperglycemia (HCC)  History of hypothyroidism  Primary hypertension-blood pressure was elevated at 164/71.  Repeat blood pressure was 192/78.  She was advised to monitor blood pressure closely and follow-up with her PCP and cardiologist.  Orders: No orders of the defined types were placed in this encounter.  No orders of the defined types were placed in this encounter.    Follow-Up Instructions: Return in about 1 year (around 06/22/2024) for Osteoarthritis.   Nicholas Bari, MD  Note - This record has been created using Animal nutritionist.  Chart creation errors have been sought, but may not always  have been located. Such creation errors do not reflect on  the standard of medical care.

## 2023-06-16 ENCOUNTER — Encounter (HOSPITAL_BASED_OUTPATIENT_CLINIC_OR_DEPARTMENT_OTHER): Payer: Self-pay

## 2023-06-23 ENCOUNTER — Ambulatory Visit: Payer: Medicare Other | Attending: Rheumatology | Admitting: Rheumatology

## 2023-06-23 ENCOUNTER — Encounter: Payer: Self-pay | Admitting: Rheumatology

## 2023-06-23 VITALS — BP 192/78 | HR 68 | Ht 62.0 in | Wt 184.0 lb

## 2023-06-23 DIAGNOSIS — D869 Sarcoidosis, unspecified: Secondary | ICD-10-CM

## 2023-06-23 DIAGNOSIS — M19042 Primary osteoarthritis, left hand: Secondary | ICD-10-CM

## 2023-06-23 DIAGNOSIS — M1712 Unilateral primary osteoarthritis, left knee: Secondary | ICD-10-CM

## 2023-06-23 DIAGNOSIS — M47812 Spondylosis without myelopathy or radiculopathy, cervical region: Secondary | ICD-10-CM

## 2023-06-23 DIAGNOSIS — D696 Thrombocytopenia, unspecified: Secondary | ICD-10-CM

## 2023-06-23 DIAGNOSIS — M2550 Pain in unspecified joint: Secondary | ICD-10-CM | POA: Diagnosis not present

## 2023-06-23 DIAGNOSIS — Z8639 Personal history of other endocrine, nutritional and metabolic disease: Secondary | ICD-10-CM

## 2023-06-23 DIAGNOSIS — Z96651 Presence of right artificial knee joint: Secondary | ICD-10-CM | POA: Diagnosis not present

## 2023-06-23 DIAGNOSIS — M19041 Primary osteoarthritis, right hand: Secondary | ICD-10-CM | POA: Diagnosis not present

## 2023-06-23 DIAGNOSIS — I3489 Other nonrheumatic mitral valve disorders: Secondary | ICD-10-CM

## 2023-06-23 DIAGNOSIS — E785 Hyperlipidemia, unspecified: Secondary | ICD-10-CM

## 2023-06-23 DIAGNOSIS — E1165 Type 2 diabetes mellitus with hyperglycemia: Secondary | ICD-10-CM

## 2023-06-23 DIAGNOSIS — K7469 Other cirrhosis of liver: Secondary | ICD-10-CM

## 2023-06-23 DIAGNOSIS — C220 Liver cell carcinoma: Secondary | ICD-10-CM

## 2023-06-23 DIAGNOSIS — I10 Essential (primary) hypertension: Secondary | ICD-10-CM

## 2023-06-23 DIAGNOSIS — R768 Other specified abnormal immunological findings in serum: Secondary | ICD-10-CM

## 2023-06-23 DIAGNOSIS — R011 Cardiac murmur, unspecified: Secondary | ICD-10-CM

## 2023-06-23 DIAGNOSIS — I5189 Other ill-defined heart diseases: Secondary | ICD-10-CM

## 2023-07-18 ENCOUNTER — Other Ambulatory Visit: Payer: Self-pay | Admitting: Internal Medicine

## 2023-07-18 DIAGNOSIS — Z1231 Encounter for screening mammogram for malignant neoplasm of breast: Secondary | ICD-10-CM

## 2023-07-20 ENCOUNTER — Ambulatory Visit
Admission: RE | Admit: 2023-07-20 | Discharge: 2023-07-20 | Disposition: A | Discharge status: Home / Self Care | Source: Ambulatory Visit

## 2023-07-20 DIAGNOSIS — Z1231 Encounter for screening mammogram for malignant neoplasm of breast: Secondary | ICD-10-CM

## 2023-09-29 ENCOUNTER — Other Ambulatory Visit: Payer: Self-pay | Admitting: Nurse Practitioner

## 2023-09-29 DIAGNOSIS — I8510 Secondary esophageal varices without bleeding: Secondary | ICD-10-CM

## 2023-09-29 DIAGNOSIS — K7469 Other cirrhosis of liver: Secondary | ICD-10-CM

## 2023-09-29 DIAGNOSIS — C220 Liver cell carcinoma: Secondary | ICD-10-CM

## 2023-10-05 ENCOUNTER — Other Ambulatory Visit

## 2023-10-14 ENCOUNTER — Encounter (HOSPITAL_BASED_OUTPATIENT_CLINIC_OR_DEPARTMENT_OTHER): Payer: Self-pay | Admitting: Certified Nurse Midwife

## 2023-10-14 ENCOUNTER — Ambulatory Visit (HOSPITAL_BASED_OUTPATIENT_CLINIC_OR_DEPARTMENT_OTHER): Admitting: Certified Nurse Midwife

## 2023-10-14 VITALS — BP 176/68 | HR 75 | Ht 62.0 in | Wt 183.2 lb

## 2023-10-14 DIAGNOSIS — L900 Lichen sclerosus et atrophicus: Secondary | ICD-10-CM | POA: Diagnosis not present

## 2023-10-14 MED ORDER — CLOBETASOL PROPIONATE 0.05 % EX OINT: 1.0000 | TOPICAL_OINTMENT | Freq: Two times a day (BID) | CUTANEOUS | 2 refills | Status: DC

## 2023-10-14 NOTE — Progress Notes (Signed)
 GYNECOLOGY  VISIT  CC:   vulvar pain  HPI: 73 y.o. No obstetric history on file. Married White or Caucasian female here for problem gyn visit. Pt states that for over a year it has been painful to try to have intercourse with her spouse. She reports only rare urinary stress incontinence (does not wear/need pads). Denies vaginal spotting or bleeding. Denies vulvar or vaginal itching.   Past Medical History:  Diagnosis Date   Anxiety    Arthritis    Asthma    Blood dyscrasia    Thrombocytopenia   CAD (coronary artery disease)    CHF (congestive heart failure) (HCC)    Complication of anesthesia    trouble waking up once with Nissen Fundoplication   COPD (chronic obstructive pulmonary disease) (HCC)    Depression    Diabetes mellitus without complication (HCC)    Dysrhythmia    Heart murmur    hepatocellular ca    Hypertension    Hypothyroidism    Liver cancer (HCC)    Liver disease    NAFLD cirrhosis   Mild aortic stenosis    Pneumonia    Sarcoidosis    Sarcoidosis of lung    Sarcoidosis of lymph nodes     MEDS:   Current Outpatient Medications on File Prior to Visit  Medication Sig Dispense Refill   acetaminophen  (TYLENOL ) 325 MG tablet Take 325-650 mg by mouth every 6 (six) hours as needed for mild pain, moderate pain or headache.     acyclovir (ZOVIRAX) 400 MG tablet Take 400 mg by mouth every 8 (eight) hours as needed (fever blisters). Fever blisters     albuterol  (VENTOLIN  HFA) 108 (90 Base) MCG/ACT inhaler Inhale 2 puffs into the lungs every 6 (six) hours as needed. 18 g 5   amLODipine  (NORVASC ) 10 MG tablet Take 1 tablet by mouth once daily 90 tablet 3   aspirin  EC 81 MG tablet Take 81 mg by mouth daily. Swallow whole.     aspirin  EC 81 MG tablet ASPIRIN  81 MG     Biotin 10 MG CAPS Take 10 mg by mouth daily.     carvedilol  (COREG ) 25 MG tablet TAKE 1 TABLET BY MOUTH TWICE DAILY WITH A MEAL 180 tablet 3   cetirizine (ZYRTEC) 10 MG tablet Take 10 mg by mouth  daily.     docusate sodium  (COLACE) 100 MG capsule Take 1 capsule (100 mg total) by mouth 2 (two) times daily. 10 capsule 0   famotidine (PEPCID) 40 MG tablet Take 40 mg by mouth 2 (two) times daily.     fluticasone  (FLONASE ) 50 MCG/ACT nasal spray Place 1 spray into both nostrils daily. 16 g 11   furosemide  (LASIX ) 20 MG tablet Take 20-40 mg by mouth See admin instructions. Take 20 mg by mouth in the morning and increase dose to 40 mg for swelling or edema     gabapentin  (NEURONTIN ) 300 MG capsule Take 300 mg by mouth 2 (two) times daily.     irbesartan  (AVAPRO ) 150 MG tablet Take 1 tablet by mouth once daily 90 tablet 3   levothyroxine  (SYNTHROID , LEVOTHROID) 150 MCG tablet Take 75-150 mcg by mouth See admin instructions. Take 150 mcg by mouth in the morning 30 minutes before breakfast and ALTERNATE with 150 mcg EVERY OTHER DAY     metFORMIN  (GLUCOPHAGE -XR) 500 MG 24 hr tablet Take 2 tablets (1,000 mg total) by mouth 2 (two) times daily. (Patient taking differently: Take 500 mg by mouth in the  morning and at bedtime.) 120 tablet 3   Multiple Vitamin (MULTIVITAMINS PO) Take by mouth.     rosuvastatin  (CRESTOR ) 10 MG tablet Take 10 mg by mouth daily.     Semaglutide,0.25 or 0.5MG /DOS, (OZEMPIC, 0.25 OR 0.5 MG/DOSE,) 2 MG/3ML SOPN Inject 0.5 mg into the skin See admin instructions. Inject 0.5 mg into the skin every 7-10 days     spironolactone  (ALDACTONE ) 25 MG tablet Take 25 mg by mouth daily.     tiZANidine  (ZANAFLEX ) 4 MG tablet Take 2-4 mg by mouth every 8 (eight) hours as needed for muscle spasms.     traMADol (ULTRAM) 50 MG tablet Take 50 mg by mouth daily as needed for moderate pain.     zolpidem  (AMBIEN ) 10 MG tablet Take 10 mg by mouth at bedtime.     ALPRAZolam  (XANAX ) 0.25 MG tablet Take 0.25 mg by mouth 3 (three) times daily as needed for anxiety. (Patient not taking: Reported on 05/25/2023)     No current facility-administered medications on file prior to visit.    ALLERGIES: Sulfa  antibiotics, Tape, Atorvastatin , Astelin  [azelastine ], and Amoxicillin  SH:  Retired Charity fundraiser  Review of Systems  Constitutional:  Negative for chills and fever.  Genitourinary:  Negative for dysuria, flank pain, frequency, hematuria and urgency.    PHYSICAL EXAMINATION:    BP (!) 176/68   Pulse 75   Ht 5' 2 (1.575 m) Comment: Reported  Wt 183 lb 3.2 oz (83.1 kg)   BMI 33.51 kg/m     General appearance: alert, cooperative and appears stated age  Pelvic: External genitalia:  Lichen Sclerosus changes noted (hour-glass pattern) around labia majora, into perineum, and around rectal region. White plaque present (white/scaly/dry).              Urethra:  normal appearing urethra with no masses, tenderness or lesions              Bartholins and Skenes: normal                 Vagina: atrophic              Anus:  normal sphincter tone, no lesions  Chaperone,  CMA, was present for exam.  Assessment/Plan:   1. Lichen sclerosus (Primary) - Long discussion regarding Lichen Sclerosus. Pt will apply fingertip amount Clobetasol  to all affected areas BID x 2-3 weeks. - She is aware of increased risk vulvar cancer, VIN. Pt aware we will plan vulvar biopsy if needed/indicated.  - RTO 2-3 weeks for follow-up.   Arland MARLA Roller

## 2023-10-17 ENCOUNTER — Other Ambulatory Visit

## 2023-10-17 ENCOUNTER — Other Ambulatory Visit (HOSPITAL_BASED_OUTPATIENT_CLINIC_OR_DEPARTMENT_OTHER): Payer: Self-pay | Admitting: Cardiology

## 2023-10-17 DIAGNOSIS — I2510 Atherosclerotic heart disease of native coronary artery without angina pectoris: Secondary | ICD-10-CM

## 2023-10-17 DIAGNOSIS — I3489 Other nonrheumatic mitral valve disorders: Secondary | ICD-10-CM

## 2023-10-17 DIAGNOSIS — I10 Essential (primary) hypertension: Secondary | ICD-10-CM

## 2023-10-23 ENCOUNTER — Ambulatory Visit
Admission: RE | Admit: 2023-10-23 | Discharge: 2023-10-23 | Disposition: A | Discharge status: Home / Self Care | Source: Ambulatory Visit | Attending: Nurse Practitioner | Admitting: Nurse Practitioner

## 2023-10-23 DIAGNOSIS — I8510 Secondary esophageal varices without bleeding: Secondary | ICD-10-CM

## 2023-10-23 DIAGNOSIS — K7469 Other cirrhosis of liver: Secondary | ICD-10-CM

## 2023-10-23 DIAGNOSIS — C220 Liver cell carcinoma: Secondary | ICD-10-CM

## 2023-10-23 MED ORDER — GADOPICLENOL 0.5 MMOL/ML IV SOLN
8.0000 mL | Freq: Once | INTRAVENOUS | Status: AC | PRN
Start: 2023-10-23 — End: 2023-10-23
  Administered 2023-10-23: 8 mL via INTRAVENOUS

## 2023-10-31 ENCOUNTER — Encounter (HOSPITAL_BASED_OUTPATIENT_CLINIC_OR_DEPARTMENT_OTHER): Payer: Self-pay | Admitting: Certified Nurse Midwife

## 2023-10-31 ENCOUNTER — Ambulatory Visit (HOSPITAL_BASED_OUTPATIENT_CLINIC_OR_DEPARTMENT_OTHER): Admitting: Certified Nurse Midwife

## 2023-10-31 ENCOUNTER — Other Ambulatory Visit: Payer: Self-pay | Admitting: Interventional Radiology

## 2023-10-31 VITALS — BP 148/92 | HR 79 | Wt 180.6 lb

## 2023-10-31 DIAGNOSIS — L900 Lichen sclerosus et atrophicus: Secondary | ICD-10-CM | POA: Diagnosis not present

## 2023-10-31 DIAGNOSIS — C220 Liver cell carcinoma: Secondary | ICD-10-CM

## 2023-10-31 MED ORDER — CLOBETASOL PROPIONATE 0.05 % EX OINT: 1.0000 | TOPICAL_OINTMENT | Freq: Two times a day (BID) | CUTANEOUS | 2 refills | Status: AC

## 2023-10-31 NOTE — Progress Notes (Signed)
 Expand All Collapse All   GYNECOLOGY  VISIT   CC:   vulvar pain   HPI: 73 y.o. No obstetric history on file. Married White or Caucasian female here for problem gyn visit. Pt states that for over a year it has been painful to try to have intercourse with her spouse. She reports only rare urinary stress incontinence (does not wear/need pads). Denies vaginal spotting or bleeding. Denies vulvar or vaginal itching       Subjective:     [name] is a 73 y.o. female who presents for follow-up GYN visit from 10/14/23 (2-3 weeks ago). She was diagnosed with vulvar lichen sclerosus at that time and started fingertip amount of Clobetasol  twice daily. Pt states I feel much better. Vulvar burning has improved. No vaginal or vulvar itching.    The following portions of the patient's history were reviewed and updated as appropriate: allergies, current medications, past family history, past medical history, past social history, past surgical history, and problem list.   Review of Systems Pertinent items are noted in HPI.    Objective:    BP (!) 148/92 (BP Location: Right Arm, Patient Position: Sitting, Cuff Size: Normal)   Pulse 79   Wt 180 lb 9.6 oz (81.9 kg)   SpO2 98%   BMI 33.03 kg/m  General appearance: alert and cooperative Pelvic: vulvar erythema and edema appears to have resolved. There are still white plaques in hour-glass pattern consistent with LS. No vaginal or vulvar bleeding noted.     Assessment:    Lichen Sclerosus.    Plan:    Pt will continue fingertip amount of Temovate  for 4 more weeks then will decrease to twice weekly. RTO 6 months for continued surveillance/follow-up and prn if issues arise prior.  Arland MARLA Roller

## 2023-11-01 ENCOUNTER — Ambulatory Visit
Admission: RE | Admit: 2023-11-01 | Discharge: 2023-11-01 | Disposition: A | Discharge status: Home / Self Care | Source: Ambulatory Visit | Attending: Interventional Radiology | Admitting: Interventional Radiology

## 2023-11-01 DIAGNOSIS — C220 Liver cell carcinoma: Secondary | ICD-10-CM

## 2023-11-01 HISTORY — PX: IR RADIOLOGIST EVAL & MGMT: IMG5224

## 2023-11-01 NOTE — Progress Notes (Signed)
 Referring Physician(s): Hays, Dawn   Supervising Physician: Karalee Beat  Chief Complaint: The patient is seen in follow up today s/p bland embolization and microwave ablation of liver lesion 07/21/22  History of present illness:  [name] is a 73 y.o. female with past medical history significant for pulmonary sarcoidosis, mild aortic stenosis, hypertension, hypothyroidism, COPD, asthma and CAD with known history of NASH cirrhosis. She was found to have a worrisome enhancing liver lesion on chest CT performed 04/13/2022, performed for the evaluation of hemoptysis. Subsequent abdominal MRI performed 04/22/2022 confirmed the presence of an approximately 3.1 x 3.1 cm enhancing lesion within segment 8 of the right lobe of the liver, characterized as LI-RADS 5, definitive HCC.   The patient was then evaluated by Dr. Dasie of the hepatobiliary surgical service and deemed a non-operative candidate given her medical comorbidities and degree of cirrhosis. The patient was then evaluated by Stephane Hays from the hepatobiliary transplant team and the patient was referred for consideration of percutaneous management.   The patient was referred to Interventional Radiology and met with Dr. Adele in consultation 06/03/22. Potential treatment options were discussed at length and the patient ultimately underwent percutaneous bland embolization and microwave ablation at [facility] on 07/21/2022. She was hospitalized for several days after the procedure due to development of a right retroperitoneal hematoma.  She has been followed by IR with routine clinic visits and surveillance imaging all of which have shown stable findings without evidence of viable tumor.  Her most recent MR abdomen was obtained 10/23/23 and she presents to the IR clinic today to discuss these results. She is feeling well and denies any pain/discomfort.      Past Medical History:  Diagnosis Date   Anxiety    Arthritis     Asthma    Blood dyscrasia    Thrombocytopenia   CAD (coronary artery disease)    CHF (congestive heart failure) (HCC)    Complication of anesthesia    trouble waking up once with Nissen Fundoplication   COPD (chronic obstructive pulmonary disease) (HCC)    Depression    Diabetes mellitus without complication (HCC)    Dysrhythmia    Heart murmur    hepatocellular ca    Hypertension    Hypothyroidism    Liver cancer (HCC)    Liver disease    NAFLD cirrhosis   Mild aortic stenosis    Pneumonia    Sarcoidosis    Sarcoidosis of lung    Sarcoidosis of lymph nodes     Past Surgical History:  Procedure Laterality Date   ABDOMINAL HYSTERECTOMY     ABDOMINAL SURGERY     CHOLECYSTECTOMY     ESOPHAGOGASTRODUODENOSCOPY (EGD) WITH PROPOFOL  N/A 04/17/2021   Procedure: ESOPHAGOGASTRODUODENOSCOPY (EGD) WITH PROPOFOL ;  Surgeon: Rollin Dover, MD;  Location: [facility]OS[facility];  Service: Gastroenterology;  Laterality: N/A;   HERNIA REPAIR     IR 3D INDEPENDENT WKST  07/21/2022   IR ANGIOGRAM SELECTIVE EACH ADDITIONAL VESSEL  07/21/2022   IR ANGIOGRAM VISCERAL SELECTIVE  07/21/2022   IR EMBO TUMOR ORGAN ISCHEMIA INFARCT INC GUIDE ROADMAPPING  07/21/2022   IR GUIDED LIVER TUMOR ABLATION RFA PERC  07/21/2022   IR RADIOLOGIST EVAL & MGMT  11/01/2023   IR US  GUIDE VASC ACCESS RIGHT  07/21/2022   LASIK  2001   LEFT HEART CATH AND CORONARY ANGIOGRAPHY N/A 09/23/2020   Procedure: LEFT HEART CATH AND CORONARY ANGIOGRAPHY;  Surgeon: Burnard Debby LABOR, MD;  Location: [facility] INVASIVE  CV LAB;  Service: Cardiovascular;  Laterality: N/A;   nissenfundiplication     RADIOLOGY WITH ANESTHESIA N/A 07/21/2022   Procedure: IR WITH ANESTHESIA MICROWAVE ABLATION;  Surgeon: Adele Rush, MD;  Location: [facility] ORS;  Service: Anesthesiology;  Laterality: N/A;   TOTAL KNEE ARTHROPLASTY Right 05/19/2021   Procedure: TOTAL KNEE ARTHROPLASTY;  Surgeon: Ernie Cough, MD;  Location: [facility] ORS;  Service: Orthopedics;  Laterality: Right;     Allergies: Sulfa antibiotics, Tape, Atorvastatin , Astelin  [azelastine ], and Amoxicillin  Medications: Prior to Admission medications   Medication Sig Start Date End Date Taking? Authorizing Provider  acetaminophen  (TYLENOL ) 325 MG tablet Take 325-650 mg by mouth every 6 (six) hours as needed for mild pain, moderate pain or headache.    [provider]  acyclovir (ZOVIRAX) 400 MG tablet Take 400 mg by mouth every 8 (eight) hours as needed (fever blisters). Fever blisters 05/19/20   [provider]  albuterol  (VENTOLIN  HFA) 108 (90 Base) MCG/ACT inhaler Inhale 2 puffs into the lungs every 6 (six) hours as needed. 04/08/22   Meade Verdon RAMAN, MD  amLODipine  (NORVASC ) 10 MG tablet Take 1 tablet by mouth once daily 12/13/22   Lonni Slain, MD  aspirin  EC 81 MG tablet Take 81 mg by mouth daily. Swallow whole.    [provider]  aspirin  EC 81 MG tablet ASPIRIN  81 MG    [provider]  Biotin 10 MG CAPS Take 10 mg by mouth daily.    [provider]  carvedilol  (COREG ) 25 MG tablet TAKE 1 TABLET BY MOUTH TWICE DAILY WITH A MEAL 10/18/23   Lonni Slain, MD  cetirizine (ZYRTEC) 10 MG tablet Take 10 mg by mouth daily.    [provider]  clobetasol  ointment (TEMOVATE ) 0.05 % Apply 1 Application topically 2 (two) times daily. Apply as directed twice daily 10/31/23   Lo, Arland POUR, CNM  docusate sodium  (COLACE) 100 MG capsule Take 1 capsule (100 mg total) by mouth 2 (two) times daily. 05/20/21   Patti Rosina SAUNDERS, PA-C  famotidine (PEPCID) 40 MG tablet Take 40 mg by mouth 2 (two) times daily.    [provider]  fluticasone  (FLONASE ) 50 MCG/ACT nasal spray Place 1 spray into both nostrils daily. 06/15/22   Meade Verdon RAMAN, MD  furosemide  (LASIX ) 20 MG tablet Take 20-40 mg by mouth See admin instructions. Take 20 mg by mouth in the morning and increase dose to 40 mg for swelling or edema    [provider]  gabapentin   (NEURONTIN ) 300 MG capsule Take 300 mg by mouth 2 (two) times daily. 09/02/20   [provider]  irbesartan  (AVAPRO ) 150 MG tablet Take 1 tablet by mouth once daily 06/07/23   Lonni Slain, MD  levothyroxine  (SYNTHROID , LEVOTHROID) 150 MCG tablet Take 75-150 mcg by mouth See admin instructions. Take 150 mcg by mouth in the morning 30 minutes before breakfast and ALTERNATE with 150 mcg EVERY OTHER DAY    [provider]  metFORMIN  (GLUCOPHAGE -XR) 500 MG 24 hr tablet Take 2 tablets (1,000 mg total) by mouth 2 (two) times daily. Patient taking differently: Take 500 mg by mouth in the morning and at bedtime. 09/25/20   Odell Celinda Balo, MD  Multiple Vitamin (MULTIVITAMINS PO) Take by mouth.    [provider]  rosuvastatin  (CRESTOR ) 10 MG tablet Take 10 mg by mouth daily.    [provider]  Semaglutide,0.25 or 0.5MG /DOS, (OZEMPIC, 0.25 OR 0.5 MG/DOSE,) 2 MG/3ML SOPN Inject 0.5 mg into  the skin See admin instructions. Inject 0.5 mg into the skin every 7-10 days    [provider]  spironolactone  (ALDACTONE ) 25 MG tablet Take 25 mg by mouth daily. 11/22/21   [provider]  tiZANidine  (ZANAFLEX ) 4 MG tablet Take 2-4 mg by mouth every 8 (eight) hours as needed for muscle spasms. 02/10/22   [provider]  traMADol (ULTRAM) 50 MG tablet Take 50 mg by mouth daily as needed for moderate pain.    [provider]  zolpidem  (AMBIEN ) 10 MG tablet Take 10 mg by mouth at bedtime.    [provider]     Family History  Problem Relation Age of Onset   Diabetes Mother    Heart attack Mother    Heart failure Father    Hypertension Father    Heart attack Father    Heart Problems Sister    Blindness Sister    Diabetes Sister    Hypertension Sister    Heart attack Brother    Heart attack Brother    Throat cancer Brother    Healthy Son    Rheum arthritis Daughter     Social History   Socioeconomic History   Marital  status: Married    Spouse name: Not on file   Number of children: Not on file   Years of education: Not on file   Highest education level: Not on file  Occupational History   Not on file  Tobacco Use   Smoking status: Never    Passive exposure: Past (minimal)   Smokeless tobacco: Never  Vaping Use   Vaping status: Never Used  Substance and Sexual Activity   Alcohol use: No   Drug use: No   Sexual activity: Not Currently  Other Topics Concern   Not on file  Social History Narrative   Not on file   Social Drivers of Health   Financial Resource Strain: Not on file  Food Insecurity: Low Risk  (09/29/2023)   Received from [facility]   Hunger Vital Sign    Within the past 12 months, you worried that your food would run out before you got money to buy more: Never true    Within the past 12 months, the food you bought just didn't last and you didn't have money to get more. : Never true  Transportation Needs: No Transportation Needs (09/29/2023)   Received from [facility]    In the past 12 months, has lack of reliable transportation kept you from medical appointments, meetings, work or from getting things needed for daily living? : No  Physical Activity: Not on file  Stress: Not on file  Social Connections: Not on file     Vital Signs: BP (!) 166/71 (BP Location: Left Arm, Patient Position: Sitting, Cuff Size: Normal)   Pulse 76   Temp 98.2 F (36.8 C) (Oral)   Resp 16   SpO2 98%   Physical Exam Constitutional:      General: She is not in acute distress.    Appearance: She is not ill-appearing.  Cardiovascular:     Rate and Rhythm: Normal rate.  Pulmonary:     Effort: Pulmonary effort is normal.  Abdominal:     Tenderness: There is no abdominal tenderness.  Neurological:     Mental Status: She is alert and oriented to person, place, and time.    Labs:  CBC: Recent Labs    11/10/22 1041 02/08/23 1127 05/12/23 1356  WBC  3.3* 3.7 4.8   HGB 13.0 12.9 12.3  HCT 39.7 38.1 36.1  PLT 86* 89* 86*    COAGS: No results for input(s): INR, APTT in the last 8760 hours.  BMP: Recent Labs    11/10/22 1041 02/08/23 1127 05/12/23 1356  NA 140 141 141  K 4.9 4.3 4.6  CL 109 106 109  CO2 27 22 28   GLUCOSE 123* 92 79  BUN 26* 20 19  CALCIUM  10.0 9.7 9.6  CREATININE 0.68 0.68 0.81  GFRNONAA >60  --  >60    LIVER FUNCTION TESTS: Recent Labs    11/10/22 1041 05/12/23 1356  BILITOT 0.5 0.6  AST 59* 61*  ALT 58* 52*  ALKPHOS 132* 106  PROT 7.5 6.9  ALBUMIN 3.8 3.8    Assessment and Plan:  73 year old female with a history of hepatocellular carcinoma s/p bland embolization and microwave ablation of a LI-RADS 5 liver lesion 07/21/22.   The patient's recent MR abdomen shows a stable 2.5 non-enhancing liver lesion in the right lobe stable with treated HCC. There are no new or suspicious lesions. The patient is doing very well and she enthusiastically expressed how grateful she is to the IR team for treating this lesion. She is also grateful that we will continue to monitor her with routine surveillance imaging.   Plan: MRI Abdomen with and without contrast in 6 months followed by Clinic visit.     Electronically Signed: Warren JONELLE Dais 11/01/2023, 3:31 PM   I spent a total of 15 Minutes in face to face in clinical consultation, greater than 50% of which was counseling/coordinating care for hepatocellular carcinoma.

## 2023-11-09 ENCOUNTER — Other Ambulatory Visit: Payer: Self-pay | Admitting: Internal Medicine

## 2023-11-10 ENCOUNTER — Inpatient Hospital Stay: Attending: Hematology and Oncology | Admitting: Hematology and Oncology

## 2023-11-10 ENCOUNTER — Inpatient Hospital Stay

## 2023-11-10 ENCOUNTER — Other Ambulatory Visit: Payer: Self-pay | Admitting: Hematology and Oncology

## 2023-11-10 VITALS — BP 164/70 | HR 72 | Temp 97.6°F | Resp 14 | Wt 179.8 lb

## 2023-11-10 DIAGNOSIS — D696 Thrombocytopenia, unspecified: Secondary | ICD-10-CM

## 2023-11-10 DIAGNOSIS — D6959 Other secondary thrombocytopenia: Secondary | ICD-10-CM | POA: Diagnosis not present

## 2023-11-10 DIAGNOSIS — D72819 Decreased white blood cell count, unspecified: Secondary | ICD-10-CM | POA: Diagnosis not present

## 2023-11-10 DIAGNOSIS — K7460 Unspecified cirrhosis of liver: Secondary | ICD-10-CM | POA: Insufficient documentation

## 2023-11-10 DIAGNOSIS — C220 Liver cell carcinoma: Secondary | ICD-10-CM | POA: Insufficient documentation

## 2023-11-10 NOTE — Progress Notes (Signed)
 [facility] [phone]) [phone]   [phone]  PROGRESS NOTE  Patient Care Team: Valma Carwin, MD as PCP - General (Internal Medicine) Lonni Slain, MD as PCP - Cardiology (Cardiology) Federico Norleen ONEIDA MADISON, MD as Consulting Physician (Hematology and Oncology)  Hematological/Oncological History # Hepatocellular Carcinoma, Stage 1b 04/13/2022: Patient underwent a CT scan of the chest wall emergency department for hemoptysis.  The scan showed no clear source of the bleeding but the report noted hepatic cirrhosis with enlarging heterogeneous enhancing lesion centrally in the right hepatic lobe, suspicious for hepatocellular carcinoma. Recommend further evaluation with abdominal MRI without and with contrast. 04/22/2022: MRI abdomen performed which showed. 3.1 x 3.1 cm early arterial phase enhancing lesion in segment 8 with subsequent washout and capsular enhancement. Findings consistent with hepatocellular carcinoma, LI-RADS 5 07/05/2022: started evaluation for liver transplant at [facility].  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 Federico     Interval History:  [name] 73 y.o. female with medical history significant for [facility] who presents for a follow up visit. The patient's last visit was on 05/12/2022. In the interim since the last visit she underwent an MRI on 10/23/2023 showed no evidence of recurrent or residual disease.  On exam today Ms. Guallpa reports reports that she has been quite well overall in interim since our last visit.  She reports that she is delighted that her MRI looked good and has been her baseline level of health.  She has strong  energy and appetite and has had no hospitalizations or ER visits.  She reports she is recently received her flu shot.  Additionally she has had no trouble with nausea, vomiting, or diarrhea.  She is not having any abdominal pain or shortness of breath.  She reports that she has had steady weight.  Overall she feels quite well and has no additional questions concerns or complaints today.  Full 10 point ROS is otherwise negative.  MEDICAL HISTORY:  Past Medical History:  Diagnosis Date   Anxiety    Arthritis    Asthma    Blood dyscrasia    Thrombocytopenia   CAD (coronary artery disease)    CHF (congestive heart failure) (HCC)    Complication of anesthesia    trouble waking up once with Nissen Fundoplication   COPD (chronic obstructive pulmonary disease) (HCC)    Depression    Diabetes mellitus without complication (HCC)    Dysrhythmia    Heart murmur    hepatocellular ca    Hypertension    Hypothyroidism    Liver cancer (HCC)    Liver disease    NAFLD cirrhosis   Mild aortic stenosis    Pneumonia    Sarcoidosis    Sarcoidosis of lung    Sarcoidosis of lymph nodes     SURGICAL HISTORY: Past Surgical History:  Procedure Laterality Date   ABDOMINAL HYSTERECTOMY     ABDOMINAL SURGERY     CHOLECYSTECTOMY     ESOPHAGOGASTRODUODENOSCOPY (EGD) WITH PROPOFOL  N/A 04/17/2021   Procedure: ESOPHAGOGASTRODUODENOSCOPY (EGD) WITH PROPOFOL ;  Surgeon: Rollin Dover, MD;  Location: [facility];  Service: Gastroenterology;  Laterality: N/A;   HERNIA REPAIR     IR 3D INDEPENDENT WKST  07/21/2022  IR ANGIOGRAM SELECTIVE EACH ADDITIONAL VESSEL  07/21/2022   IR ANGIOGRAM VISCERAL SELECTIVE  07/21/2022   IR EMBO TUMOR ORGAN ISCHEMIA INFARCT INC GUIDE ROADMAPPING  07/21/2022   IR GUIDED LIVER TUMOR ABLATION RFA PERC  07/21/2022   IR RADIOLOGIST EVAL & MGMT  11/01/2023   IR US  GUIDE VASC ACCESS RIGHT  07/21/2022   LASIK  2001   LEFT HEART CATH AND CORONARY ANGIOGRAPHY N/A 09/23/2020   Procedure: LEFT  HEART CATH AND CORONARY ANGIOGRAPHY;  Surgeon: Burnard Debby LABOR, MD;  Location: [facility]VAS[facility];  Service: Cardiovascular;  Laterality: N/A;   nissenfundiplication     RADIOLOGY WITH ANESTHESIA N/A 07/21/2022   Procedure: IR WITH ANESTHESIA MICROWAVE ABLATION;  Surgeon: Adele Rush, MD;  Location: [facility];  Service: Anesthesiology;  Laterality: N/A;   TOTAL KNEE ARTHROPLASTY Right 05/19/2021   Procedure: TOTAL KNEE ARTHROPLASTY;  Surgeon: Ernie Cough, MD;  Location: [facility] OR[facility];  Service: Orthopedics;  Laterality: Right;    SOCIAL HISTORY: Social History   Socioeconomic History   Marital status: Married    Spouse name: Not on file   Number of children: Not on file   Years of education: Not on file   Highest education level: Not on file  Occupational History   Not on file  Tobacco Use   Smoking status: Never    Passive exposure: Past (minimal)   Smokeless tobacco: Never  Vaping Use   Vaping status: Never Used  Substance and Sexual Activity   Alcohol use: No   Drug use: No   Sexual activity: Not Currently  Other Topics Concern   Not on file  Social History Narrative   Not on file   Social Drivers of Health   Financial Resource Strain: Not on file  Food Insecurity: Low Risk  (09/29/2023)   Received from [facility]   Hunger Vital Sign    Within the past 12 months, you worried that your food would run out before you got money to buy more: Never true    Within the past 12 months, the food you bought just didn't last and you didn't have money to get more. : Never true  Transportation Needs: No Transportation Needs (09/29/2023)   Received from [facility]    In the past 12 months, has lack of reliable transportation kept you from medical appointments, meetings, work or from getting things needed for daily living? : No  Physical Activity: Not on file  Stress: Not on file  Social Connections: Not on file  Intimate Partner Violence: Not At Risk (07/24/2022)    Humiliation, Afraid, Rape, and Kick questionnaire    Fear of Current or Ex-Partner: No    Emotionally Abused: No    Physically Abused: No    Sexually Abused: No    FAMILY HISTORY: Family History  Problem Relation Age of Onset   Diabetes Mother    Heart attack Mother    Heart failure Father    Hypertension Father    Heart attack Father    Heart Problems Sister    Blindness Sister    Diabetes Sister    Hypertension Sister    Heart attack Brother    Heart attack Brother    Throat cancer Brother    Healthy Son    Rheum arthritis Daughter     ALLERGIES:  is allergic to sulfa antibiotics, tape, atorvastatin , astelin  [azelastine ], and amoxicillin.  MEDICATIONS:  Current Outpatient Medications  Medication Sig Dispense Refill   acetaminophen  (  TYLENOL ) 325 MG tablet Take 325-650 mg by mouth every 6 (six) hours as needed for mild pain, moderate pain or headache.     acyclovir (ZOVIRAX) 400 MG tablet Take 400 mg by mouth every 8 (eight) hours as needed (fever blisters). Fever blisters     albuterol  (VENTOLIN  HFA) 108 (90 Base) MCG/ACT inhaler Inhale 2 puffs into the lungs every 6 (six) hours as needed. 18 g 5   amLODipine  (NORVASC ) 10 MG tablet Take 1 tablet by mouth once daily 90 tablet 3   aspirin  EC 81 MG tablet Take 81 mg by mouth daily. Swallow whole.     aspirin  EC 81 MG tablet ASPIRIN  81 MG     Biotin 10 MG CAPS Take 10 mg by mouth daily.     carvedilol  (COREG ) 25 MG tablet TAKE 1 TABLET BY MOUTH TWICE DAILY WITH A MEAL 180 tablet 2   cetirizine (ZYRTEC) 10 MG tablet Take 10 mg by mouth daily. (Patient not taking: Reported on 11/01/2023)     clobetasol  ointment (TEMOVATE ) 0.05 % Apply 1 Application topically 2 (two) times daily. Apply as directed twice daily 60 g 2   docusate sodium  (COLACE) 100 MG capsule Take 1 capsule (100 mg total) by mouth 2 (two) times daily. 10 capsule 0   famotidine (PEPCID) 40 MG tablet Take 40 mg by mouth 2 (two) times daily.     fluticasone  (FLONASE )  50 MCG/ACT nasal spray Place 1 spray into both nostrils daily. 16 g 11   furosemide  (LASIX ) 20 MG tablet Take 20-40 mg by mouth See admin instructions. Take 20 mg by mouth in the morning and increase dose to 40 mg for swelling or edema     gabapentin  (NEURONTIN ) 300 MG capsule Take 300 mg by mouth daily.     irbesartan  (AVAPRO ) 150 MG tablet Take 1 tablet by mouth once daily 90 tablet 3   levothyroxine  (SYNTHROID , LEVOTHROID) 150 MCG tablet Take 75-150 mcg by mouth See admin instructions. Take 150 mcg by mouth in the morning 30 minutes before breakfast and ALTERNATE with 150 mcg EVERY OTHER DAY     metFORMIN  (GLUCOPHAGE -XR) 500 MG 24 hr tablet Take 2 tablets (1,000 mg total) by mouth 2 (two) times daily. (Patient taking differently: Take 500 mg by mouth in the morning and at bedtime.) 120 tablet 3   Multiple Vitamin (MULTIVITAMINS PO) Take by mouth.     rosuvastatin  (CRESTOR ) 10 MG tablet Take 10 mg by mouth daily.     Semaglutide,0.25 or 0.5MG /DOS, (OZEMPIC, 0.25 OR 0.5 MG/DOSE,) 2 MG/3ML SOPN Inject 0.5 mg into the skin See admin instructions. Inject 0.5 mg into the skin every 7-10 days     spironolactone  (ALDACTONE ) 25 MG tablet Take 25 mg by mouth daily.     tiZANidine  (ZANAFLEX ) 4 MG tablet Take 2-4 mg by mouth every 8 (eight) hours as needed for muscle spasms.     traMADol (ULTRAM) 50 MG tablet Take 50 mg by mouth daily as needed for moderate pain. (Patient not taking: Reported on 11/01/2023)     zolpidem  (AMBIEN ) 10 MG tablet Take 10 mg by mouth at bedtime.     No current facility-administered medications for this visit.    REVIEW OF SYSTEMS:   Constitutional: ( - ) fevers, ( - )  chills , ( - ) night sweats Eyes: ( - ) blurriness of vision, ( - ) double vision, ( - ) watery eyes Ears, nose, mouth, throat, and face: ( - ) mucositis, ( - )  sore throat Respiratory: ( - ) cough, ( - ) dyspnea, ( - ) wheezes Cardiovascular: ( - ) palpitation, ( - ) chest discomfort, ( - ) lower extremity  swelling Gastrointestinal:  ( - ) nausea, ( - ) heartburn, ( - ) change in bowel habits Skin: ( - ) abnormal skin rashes Lymphatics: ( - ) new lymphadenopathy, ( - ) easy bruising Neurological: ( - ) numbness, ( - ) tingling, ( - ) new weaknesses Behavioral/Psych: ( - ) mood change, ( - ) new changes  All other systems were reviewed with the patient and are negative.  PHYSICAL EXAMINATION: ECOG PERFORMANCE STATUS: 0 - Asymptomatic  Vitals:   11/10/23 1050  BP: (!) 164/70  Pulse: 72  Resp: 14  Temp: 97.6 F (36.4 C)  SpO2: 98%      Filed Weights   11/10/23 1050  Weight: 179 lb 12.8 oz (81.6 kg)       GENERAL: Well-appearing elderly Caucasian female, alert, no distress and comfortable SKIN: skin color, texture, turgor are normal, no rashes or significant lesions EYES: conjunctiva are pink and non-injected, sclera clear LUNGS: clear to auscultation and percussion with normal breathing effort HEART: regular rate & rhythm and no murmurs and no lower extremity edema Musculoskeletal: no cyanosis of digits and no clubbing  PSYCH: alert & oriented x 3, fluent speech NEURO: no focal motor/sensory deficits  LABORATORY DATA:  I have reviewed the data as listed    Latest Ref Rng & Units 05/12/2023    1:56 PM 02/08/2023   11:27 AM 11/10/2022   10:41 AM  CBC  WBC 4.0 - 10.5 K/uL 4.8  3.7  3.3   Hemoglobin 12.0 - 15.0 g/dL 87.6  87.0  86.9   Hematocrit 36.0 - 46.0 % 36.1  38.1  39.7   Platelets 150 - 400 K/uL 86  89  86        Latest Ref Rng & Units 05/12/2023    1:56 PM 02/08/2023   11:27 AM 11/10/2022   10:41 AM  CMP  Glucose 70 - 99 mg/dL 79  92  876   BUN 8 - 23 mg/dL 19  20  26    Creatinine 0.44 - 1.00 mg/dL 9.18  9.31  9.31   Sodium 135 - 145 mmol/L 141  141  140   Potassium 3.5 - 5.1 mmol/L 4.6  4.3  4.9   Chloride 98 - 111 mmol/L 109  106  109   CO2 22 - 32 mmol/L 28  22  27    Calcium  8.9 - 10.3 mg/dL 9.6  9.7  89.9   Total Protein 6.5 - 8.1 g/dL 6.9   7.5    Total Bilirubin 0.0 - 1.2 mg/dL 0.6   0.5   Alkaline Phos 38 - 126 U/L 106   132   AST 15 - 41 U/L 61   59   ALT 0 - 44 U/L 52   58     RADIOGRAPHIC STUDIES: IR Radiologist Eval & Mgmt Result Date: 11/01/2023 EXAM: ESTABLISHED PATIENT OFFICE VISIT CHIEF COMPLAINT: SEE NOTE IN EPIC HISTORY OF PRESENT ILLNESS: SEE NOTE IN EPIC REVIEW OF SYSTEMS: SEE NOTE IN EPIC PHYSICAL EXAMINATION: SEE NOTE IN EPIC ASSESSMENT AND PLAN: SEE NOTE IN EPIC Electronically Signed   By: Wilkie Lent M.D.   On: 11/01/2023 15:16   MR ABDOMEN WWO CONTRAST Result Date: 10/24/2023 CLINICAL DATA:  Cirrhosis of liver, hepatocellular carcinoma, right quadrant pain x1 0.5 years, sternum pain x2 months  diagnosed with liver cancer 05/08/2022. EXAM: MRI ABDOMEN WITHOUT AND WITH CONTRAST TECHNIQUE: Multiplanar multisequence MR imaging of the abdomen was performed both before and after the administration of intravenous contrast. CONTRAST:  8 cc of Vueway  COMPARISON:  Multiple priors including MRI April 27, 2023 FINDINGS: Lower chest: No acute abnormality. Hepatobiliary: Cirrhotic hepatic morphology. Diffuse hepatic steatosis. Stable T2 hypointense T1 heterogeneously hyperintense central right lobe of the liver hepatic lesion which does not demonstrate postcontrast enhancement measuring 2.5 x 1.8 cm on image 25/13, unchanged. No new suspicious hepatic lesion. Gallbladder surgically absent. Similar prominence of the biliary tree compatible with reservoir effect post cholecystectomy. Pancreas: No pancreatic ductal dilation or evidence of acute inflammation. Spleen:  Splenomegaly measuring 14.4 cm in maximum axial dimension. Adrenals/Urinary Tract: No suspicious adrenal nodule/mass. No hydronephrosis. No suspicious renal mass. Stomach/Bowel: No evidence of bowel obstruction. Vascular/Lymphatic: No pathologically enlarged lymph nodes identified. No abdominal aortic aneurysm demonstrated. The portal, splenic and superior mesenteric veins are  patent. Other:  No significant abdominal free fluid. Musculoskeletal: No suspicious bone lesions identified. IMPRESSION: 1. Stable 2.5 cm nonenhancing central right lobe of the liver lesion, consistent with treated hepatocellular carcinoma. LI-RADS TR-nonviable. No new suspicious hepatic lesion. 2. Cirrhotic hepatic morphology with hepatic steatosis and sequela of portal hypertension including splenomegaly. Electronically Signed   By: Reyes Holder M.D.   On: 10/24/2023 12:28    ASSESSMENT & PLAN LOISE ESGUERRA 73 y.o. female with medical history significant for newly diagnosed [facility] who presents for a follow up visit.   # Hepatocellular Carcinoma, Stage 1b -- Diagnosis confirmed based on the MRI imaging. --AFP, CEA, and CA 19-9 were WNL prior to intervention. No utility in monitoring these.  -- currently under evaluation for liver transplant by [facility]. Follows with them routinely.  -- underwent embolization/microwave ablation on 07/21/2022.  -- Labs today show white blood cell count 4.5, hemoglobin 13.9, MCV 88.1, platelets 86.  Additionally showed creatinine 0.61 with AST 40, ALT 42, bilirubin 0.6, alk phos 117 (outside labs from [facility]). -- MRI of the liver on 04/27/2023 showed an unchanged ablation zone without evidence of viable tumor, no other findings concerning for recurrent or metastatic HCC.  Most recent MRI was performed on 10/23/2023, report also shows treated area of ablation but no evidence of active disease. --RTC in 6 months.   # Leukopenia/Thrombocytopenia -- Secondary to cirrhosis. -- Appears stable compared to have declined but stable over last 6 months, currently at 86  No orders of the defined types were placed in this encounter.   All questions were answered. The patient knows to call the clinic with any problems, questions or concerns.  A total of more than 25 minutes were spent on this encounter with face-to-face time and non-face-to-face time, including preparing to see the  patient, ordering tests and/or medications, counseling the patient and coordination of care as outlined above.   Norleen IVAR Kidney, MD Department of Hematology/Oncology [facility] Phone: [phone] Pager: [phone] Email: [email or web address]  11/10/2023 4:54 PM

## 2023-12-05 ENCOUNTER — Other Ambulatory Visit: Payer: Self-pay | Admitting: Internal Medicine

## 2024-02-23 ENCOUNTER — Other Ambulatory Visit: Payer: Self-pay | Admitting: Internal Medicine

## 2024-02-23 ENCOUNTER — Ambulatory Visit
Admission: RE | Admit: 2024-02-23 | Discharge: 2024-02-23 | Disposition: A | Source: Ambulatory Visit | Attending: Internal Medicine | Admitting: Internal Medicine

## 2024-02-23 DIAGNOSIS — R0789 Other chest pain: Secondary | ICD-10-CM

## 2024-04-30 ENCOUNTER — Ambulatory Visit (HOSPITAL_BASED_OUTPATIENT_CLINIC_OR_DEPARTMENT_OTHER): Admitting: Certified Nurse Midwife

## 2024-05-10 ENCOUNTER — Inpatient Hospital Stay: Admitting: Hematology and Oncology

## 2024-05-10 ENCOUNTER — Inpatient Hospital Stay

## 2024-06-19 ENCOUNTER — Ambulatory Visit: Admitting: Rheumatology
